# Patient Record
Sex: Female | Born: 1940
Health system: Southern US, Community
[De-identification: ages and names within clinical notes are randomized; demographics above are authoritative.]

## PROBLEM LIST (undated history)

## (undated) DIAGNOSIS — C4491 Basal cell carcinoma of skin, unspecified: Secondary | ICD-10-CM

## (undated) DIAGNOSIS — E785 Hyperlipidemia, unspecified: Secondary | ICD-10-CM

## (undated) DIAGNOSIS — I6529 Occlusion and stenosis of unspecified carotid artery: Secondary | ICD-10-CM

## (undated) DIAGNOSIS — K219 Gastro-esophageal reflux disease without esophagitis: Secondary | ICD-10-CM

## (undated) DIAGNOSIS — N183 Chronic kidney disease, stage 3 unspecified: Secondary | ICD-10-CM

## (undated) DIAGNOSIS — K760 Fatty (change of) liver, not elsewhere classified: Secondary | ICD-10-CM

## (undated) DIAGNOSIS — R002 Palpitations: Secondary | ICD-10-CM

## (undated) DIAGNOSIS — K1120 Sialoadenitis, unspecified: Secondary | ICD-10-CM

## (undated) DIAGNOSIS — K589 Irritable bowel syndrome without diarrhea: Secondary | ICD-10-CM

## (undated) DIAGNOSIS — I10 Essential (primary) hypertension: Secondary | ICD-10-CM

## (undated) DIAGNOSIS — G4700 Insomnia, unspecified: Secondary | ICD-10-CM

## (undated) DIAGNOSIS — R51 Headache: Secondary | ICD-10-CM

## (undated) DIAGNOSIS — K635 Polyp of colon: Secondary | ICD-10-CM

## (undated) DIAGNOSIS — M1990 Unspecified osteoarthritis, unspecified site: Secondary | ICD-10-CM

## (undated) DIAGNOSIS — M5136 Other intervertebral disc degeneration, lumbar region: Secondary | ICD-10-CM

## (undated) DIAGNOSIS — R519 Headache, unspecified: Secondary | ICD-10-CM

## (undated) DIAGNOSIS — Z85820 Personal history of malignant melanoma of skin: Secondary | ICD-10-CM

## (undated) DIAGNOSIS — M8580 Other specified disorders of bone density and structure, unspecified site: Secondary | ICD-10-CM

## (undated) DIAGNOSIS — M154 Erosive (osteo)arthritis: Secondary | ICD-10-CM

## (undated) HISTORY — DX: Insomnia, unspecified: G47.00

## (undated) HISTORY — DX: Occlusion and stenosis of unspecified carotid artery: I65.29

## (undated) HISTORY — DX: Erosive (osteo)arthritis: M15.4

## (undated) HISTORY — DX: Fatty (change of) liver, not elsewhere classified: K76.0

## (undated) HISTORY — DX: Polyp of colon: K63.5

## (undated) HISTORY — DX: Irritable bowel syndrome, unspecified: K58.9

## (undated) HISTORY — DX: Hyperlipidemia, unspecified: E78.5

## (undated) HISTORY — PX: RADIAL KERATOTOMY: SHX217

## (undated) HISTORY — DX: Basal cell carcinoma of skin, unspecified: C44.91

## (undated) HISTORY — PX: TONSILLECTOMY: SUR1361

## (undated) HISTORY — DX: Headache, unspecified: R51.9

## (undated) HISTORY — DX: Gastro-esophageal reflux disease without esophagitis: K21.9

## (undated) HISTORY — DX: Essential (primary) hypertension: I10

## (undated) HISTORY — DX: Sialoadenitis, unspecified: K11.20

## (undated) HISTORY — PX: BLADDER SUSPENSION: SHX72

## (undated) HISTORY — PX: VAGINAL HYSTERECTOMY: SUR661

## (undated) HISTORY — DX: Other intervertebral disc degeneration, lumbar region: M51.36

## (undated) HISTORY — DX: Other specified disorders of bone density and structure, unspecified site: M85.80

## (undated) HISTORY — DX: Headache: R51

## (undated) HISTORY — DX: Personal history of malignant melanoma of skin: Z85.820

## (undated) HISTORY — DX: Unspecified osteoarthritis, unspecified site: M19.90

---

## 1993-04-26 HISTORY — PX: BACK SURGERY: SHX140

## 1998-04-26 HISTORY — PX: CARPAL TUNNEL RELEASE: SHX101

## 1998-07-07 ENCOUNTER — Encounter: Payer: Self-pay | Admitting: Obstetrics and Gynecology

## 1998-07-10 ENCOUNTER — Inpatient Hospital Stay (HOSPITAL_COMMUNITY): Admission: RE | Admit: 1998-07-10 | Discharge: 1998-07-11 | Payer: Self-pay | Admitting: Obstetrics and Gynecology

## 1998-08-27 ENCOUNTER — Encounter: Payer: Self-pay | Admitting: Obstetrics and Gynecology

## 1998-08-27 ENCOUNTER — Ambulatory Visit (HOSPITAL_COMMUNITY): Admission: RE | Admit: 1998-08-27 | Discharge: 1998-08-27 | Payer: Self-pay | Admitting: Obstetrics and Gynecology

## 2000-06-29 ENCOUNTER — Encounter: Payer: Self-pay | Admitting: Internal Medicine

## 2000-06-29 ENCOUNTER — Ambulatory Visit (HOSPITAL_COMMUNITY): Admission: RE | Admit: 2000-06-29 | Discharge: 2000-06-29 | Payer: Self-pay | Admitting: Internal Medicine

## 2000-10-18 ENCOUNTER — Ambulatory Visit (HOSPITAL_BASED_OUTPATIENT_CLINIC_OR_DEPARTMENT_OTHER): Admission: RE | Admit: 2000-10-18 | Discharge: 2000-10-18 | Payer: Self-pay | Admitting: Orthopedic Surgery

## 2000-11-15 ENCOUNTER — Ambulatory Visit (HOSPITAL_BASED_OUTPATIENT_CLINIC_OR_DEPARTMENT_OTHER): Admission: RE | Admit: 2000-11-15 | Discharge: 2000-11-15 | Payer: Self-pay | Admitting: Orthopedic Surgery

## 2001-03-24 ENCOUNTER — Encounter: Admission: RE | Admit: 2001-03-24 | Discharge: 2001-03-24 | Payer: Self-pay | Admitting: Family Medicine

## 2001-03-24 ENCOUNTER — Encounter: Payer: Self-pay | Admitting: Family Medicine

## 2001-04-26 DIAGNOSIS — Z85820 Personal history of malignant melanoma of skin: Secondary | ICD-10-CM

## 2001-04-26 HISTORY — DX: Personal history of malignant melanoma of skin: Z85.820

## 2003-08-16 ENCOUNTER — Encounter: Admission: RE | Admit: 2003-08-16 | Discharge: 2003-08-16 | Payer: Self-pay | Admitting: Neurosurgery

## 2003-09-11 ENCOUNTER — Encounter: Admission: RE | Admit: 2003-09-11 | Discharge: 2003-09-11 | Payer: Self-pay | Admitting: Neurosurgery

## 2003-09-25 ENCOUNTER — Encounter: Admission: RE | Admit: 2003-09-25 | Discharge: 2003-09-25 | Payer: Self-pay | Admitting: Neurosurgery

## 2005-04-26 HISTORY — PX: BILATERAL OOPHORECTOMY: SHX1221

## 2005-09-14 ENCOUNTER — Ambulatory Visit: Admission: RE | Admit: 2005-09-14 | Discharge: 2005-09-14 | Payer: Self-pay | Admitting: Gynecology

## 2005-09-21 ENCOUNTER — Inpatient Hospital Stay (HOSPITAL_COMMUNITY): Admission: RE | Admit: 2005-09-21 | Discharge: 2005-09-23 | Payer: Self-pay | Admitting: Gynecology

## 2005-09-21 ENCOUNTER — Encounter (INDEPENDENT_AMBULATORY_CARE_PROVIDER_SITE_OTHER): Payer: Self-pay | Admitting: *Deleted

## 2006-11-11 ENCOUNTER — Encounter: Admission: RE | Admit: 2006-11-11 | Discharge: 2006-11-11 | Payer: Self-pay | Admitting: Family Medicine

## 2007-11-23 ENCOUNTER — Encounter: Admission: RE | Admit: 2007-11-23 | Discharge: 2007-11-23 | Payer: Self-pay | Admitting: Family Medicine

## 2008-12-12 ENCOUNTER — Encounter: Admission: RE | Admit: 2008-12-12 | Discharge: 2008-12-12 | Payer: Self-pay | Admitting: Family Medicine

## 2009-04-26 DIAGNOSIS — M8580 Other specified disorders of bone density and structure, unspecified site: Secondary | ICD-10-CM

## 2009-04-26 HISTORY — PX: OTHER SURGICAL HISTORY: SHX169

## 2009-04-26 HISTORY — DX: Other specified disorders of bone density and structure, unspecified site: M85.80

## 2009-04-26 LAB — HM DEXA SCAN

## 2009-06-05 ENCOUNTER — Encounter: Admission: RE | Admit: 2009-06-05 | Discharge: 2009-06-05 | Payer: Self-pay | Admitting: Family Medicine

## 2009-12-12 ENCOUNTER — Encounter: Admission: RE | Admit: 2009-12-12 | Discharge: 2009-12-12 | Payer: Self-pay | Admitting: Family Medicine

## 2010-02-05 ENCOUNTER — Ambulatory Visit: Payer: Self-pay | Admitting: Family Medicine

## 2010-04-26 DIAGNOSIS — K760 Fatty (change of) liver, not elsewhere classified: Secondary | ICD-10-CM

## 2010-04-26 HISTORY — DX: Fatty (change of) liver, not elsewhere classified: K76.0

## 2010-05-26 NOTE — Assessment & Plan Note (Signed)
Summary: FLU SHOT/JBB  Nurse Visit   Immunizations Administered:  Influenza Vaccine:    Vaccine Type: FLULAVAL    Site: left deltoid    Mfr: GlaxoSmithKline    Dose: 0.5 ml    Route: IM    Given by: Levonne Spiller EMT-P    Exp. Date: 09/24/2010    Lot #: [id number]    VIS given: 11/18/09 version given February 05, 2010.   Immunizations Administered:  Influenza Vaccine:    Vaccine Type: FLULAVAL    Site: left deltoid    Mfr: GlaxoSmithKline    Dose: 0.5 ml    Route: IM    Given by: Levonne Spiller EMT-P    Exp. Date: 09/24/2010    Lot #: [id number]    VIS given: 11/18/09 version given February 05, 2010.  Flu Vaccine Consent Questions:    Do you have a history of severe allergic reactions to this vaccine? no    Any prior history of allergic reactions to egg and/or gelatin? no    Do you have a sensitivity to the preservative Thimersol? no    Do you have a past history of Guillan-Barre Syndrome? no    Do you currently have an acute febrile illness? no    Have you ever had a severe reaction to latex? no    Vaccine information given and explained to patient? yes    Are you currently pregnant? no

## 2010-06-25 HISTORY — PX: OTHER SURGICAL HISTORY: SHX169

## 2010-09-11 NOTE — Discharge Summary (Signed)
NAMECORABELLE, SPACKMAN                 ACCOUNT NO.:  [id number]   MEDICAL RECORD NO.:  [id number]          PATIENT TYPE:  INP   LOCATION:  1615                         FACILITY:  [facility]   PHYSICIAN:  Zenaida Niece, M.D.DATE OF BIRTH:  06-11-1940   DATE OF ADMISSION:  09/21/2005  DATE OF DISCHARGE:  09/23/2005                                 DISCHARGE SUMMARY   ADMISSION DIAGNOSIS:  Ovarian cysts.   DISCHARGE DIAGNOSIS:  Bilateral ovarian serous cystadenofibromas.   PROCEDURE:  On Sep 21, 2005 she had an exploratory laparotomy with bilateral  salpingo-oophorectomy.   HISTORY AND PHYSICAL:  Please see chart for full History and Physical.  Briefly this is a 70 year old white female who has been followed with serial  ultrasounds and CA-125s for ovarian cysts.  Her most recent ultrasound in  May reveals significant increase in complexity and size of bilateral ovarian  cysts.  Her CA-125 is stable.   PAST HISTORY:  Significant for one vaginal delivery.   PAST SURGICAL HISTORY:  Vaginal hysterectomy with anterior repair followed  by another anterior repair, perineorrhaphy, and pubovaginal sling.   PHYSICAL EXAMINATION:  ABDOMEN:  Benign without palpable masses.  PELVIC:  No significant masses.   HOSPITAL COURSE:  The patient was admitted on the day of surgery and taken  to the operating room.  She had exploratory laparotomy with bilateral  salpingo-oophorectomy performed by myself and Dr. Kyla Balzarine.  Frozen section  revealed ovarian fibroadenomas of both ovaries which were enlarged and  cystic.  Postoperatively, she had no significant complications.  Preoperative hemoglobin 12.6, postoperative 10.5.  On the morning of May 31,  which was postoperative day #2 she had some difficulty voiding.  She was  straight cathed x1 but was able to void on her own after that.  She was felt  to be stable enough for discharge home after that.   DISCHARGE INSTRUCTIONS:  Diet is a regular diet.  Activity  is no strenuous  activity.  Follow up is in 4-5 days for staple removal.   DISCHARGE MEDICATIONS:  Percocet #30 p.r.n. pain.   FINAL PATHOLOGY:  Bilateral serous cystadenofibromas of both ovaries and  benign fallopian tubes with paratubal cyst.      Zenaida Niece, M.D.  Electronically Signed     TDM/MEDQ  D:  10/11/2005  T:  10/12/2005  Job:  [id number]   cc:   Jonny Ruiz T. Kyla Balzarine, M.D.  [address]  [place]  [place] [address]   Telford Nab, R.N.  [address]  [place], [place] [address]

## 2010-09-11 NOTE — Op Note (Signed)
Carrie Lucas, Carrie Lucas                 ACCOUNT NO.:  [id number]   MEDICAL RECORD NO.:  [id number]          PATIENT TYPE:  INP   LOCATION:  X003                         FACILITY:  [facility]   PHYSICIAN:  John T. Kyla Balzarine, M.D.    DATE OF BIRTH:  [birth year]   DATE OF PROCEDURE:  DATE OF DISCHARGE:                                 OPERATIVE REPORT   PREOPERATIVE DIAGNOSIS:  Complex adnexal masses.   POSTOPERATIVE DIAGNOSES:  1.  Benign fibroadenomas of the ovary.  2.  Pelvic adhesions.   PROCEDURE:  Bilateral salpingo-oophorectomy.   ANESTHESIA:  General endotracheal.   ESTIMATED BLOOD LOSS:  100 mL.   SURGEON:  Lavina Hamman, MD   ASSISTANT:  Ronita Hipps, MD   OPERATIVE FINDINGS:  The patient had bilateral fibroadenomas, benign on  frozen section.  There were adhesions from omentum to the vaginal cuff and  adnexa; prior hysterectomy.   DESCRIPTION OF PROCEDURE:  Please refer to Dr. Eligha Bridegroom formal operative  dictated note.  Throughout surgery I served as an Geophysicist/field seismologist.  The patient  was explored through a midline incision with the findings described above.  Adhesions between omentum and adnexa were freed, bowel packed out of the  pelvis and bilateral salpingo-oophorectomy performed.  Bilaterally, the  round ligaments were divided with electrocautery, peritoneum of the broad  ligament opened lateral to the infundibulopelvic ligaments and ureters  identified.  The infundibulopelvic ligaments were skeletonized,  crossclamped, divided and doubly ligated with 2-0 Vicryl.  Using sharp and  blunt dissection with electrocautery for hemostasis, the cystic ovaries were  dissected off of the sidewall and the cuff.  Additional hemostasis was  required with interrupted figure-of-eight sutures of 2-0 Vicryl in the left  paravesical region.  Hemostasis was excellent, sponges and retractors were  removed, abdomen irrigated and closed in layers  with running PDS for fascia and clips for the skin.   Frozen section returned  benign fibroadenomas.  Sponge and instrument counts were correct.  Pathology  specimens, bilateral tubes and ovaries.  The patient returned to the  recovery room in stable condition.      John T. Kyla Balzarine, M.D.  Electronically Signed     JTS/MEDQ  D:  09/21/2005  T:  09/21/2005  Job:  [id number]   cc:   Zenaida Niece, M.D.  Fax: [phone]   Telford Nab, R.N.  [address]  [place], [place] [address]

## 2010-09-11 NOTE — Consult Note (Signed)
Carrie Lucas, Carrie Lucas NO.:  [id number]   MEDICAL RECORD NO.:  [id number]          PATIENT TYPE:  OUT   LOCATION:  GYN                          FACILITY:  [facility]   PHYSICIAN:  De Blanch, M.D.DATE OF BIRTH:  Dec 12, 1940   DATE OF CONSULTATION:  09/14/2005  DATE OF DISCHARGE:                                   CONSULTATION   CHIEF COMPLAINT:  Complex adnexal mass.   HISTORY OF PRESENT ILLNESS:  A 70 year old white female seen in consultation  at the request of Dr. Tawanna Cooler Meisinger regarding the evolution of ovarian  cysts which now contain multiple internal solid irregular excrescences. It  is interesting to note that the patient has been followed with serial  ultrasounds since 2002 with ovarian cysts which were simple and with smooth  wall, no excrescences, no nodularities or no solid areas. The most recent  ultrasound obtained prior to this first abnormal one on May 11 was on July 20, 2004. The patient has also had normal CA 125 periodically, the most  recent being 6.6 units/mL on Sep 09, 2005.   The patient does have abdominal bloating and some abdominal pressure which  she attributes to irritable bowel syndrome.   PAST MEDICAL HISTORY:  Medical illnesses:  Irritable bowel syndrome.   PAST SURGICAL HISTORY:  1.  Back surgery in 1995.  2.  Vaginal hysterectomy.  3.  Carpal tunnel release.  4.  Suburethral sling.   CURRENT MEDICATIONS:  Tamoxifen (as prophylaxis for breast cancer) and  Premarin 0.325 mg. The patient stopped both of these medications  approximately 2 weeks ago.   OBSTETRICAL HISTORY:  Gravida 1.   ALLERGIES:  None.   FAMILY HISTORY:  The patient has a sister with breast cancer and a paternal  aunt with ovarian cancer.   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   REVIEW OF SYSTEMS:  A 10-point comprehensive review of  systems is performed  and is negative except for symptoms noted above.   PHYSICAL EXAMINATION:  VITAL SIGNS:  Weight 148 pounds, height 5 foot 2,  blood pressure 164/84, pulse 96.  GENERAL:  The patient is a healthy white female in no acute distress.  HEENT:  Negative.  NECK:  Supple without thyromegaly. There was no supraclavicular or inguinal  adenopathy.  ABDOMEN:  Soft and nontender, no masses, organomegaly, ascites or hernias  are noted.  PELVIC:  EGBUS, vagina, bladder, urethra are normal. Cervix and uterus are  surgically absent. On bimanual examination, there is fullness throughout the  pelvis without a discreet mass. I do not feel any nodularity and there is no  pain. Rectovaginal exam confirms.   IMPRESSION:  Complex pelvic masses which now have internal excrescences  concerning for an ovarian neoplasm.   I had a lengthy discussion with the patient and her husband. I recommend  that she undergo exploratory laparotomy, bilateral salpingo-oophorectomy and  intraoperative frozen section. We outlined the course of surgery should this  be an ovarian cancer including multiple peritoneal biopsies, washings,  omentectomy and pelvic and periaortic lymphadenopathy. The risks of surgery  including hemorrhage, infection, injury to adjacent viscera, thromboembolic  complications, and anesthetic risks were outlined. All other questions are  answered and we will proceed with surgery on June 5 with Dr. Kyla Balzarine as the  primary gynecologic/oncology surgeon. All other questions are answered.      De Blanch, M.D.  Electronically Signed     DC/MEDQ  D:  09/14/2005  T:  09/15/2005  Job:  [id number]   cc:   Zenaida Niece, M.D.  Fax: [phone]   Telford Nab, R.N.  [address]  [place], [place] [address]

## 2010-09-11 NOTE — H&P (Signed)
NAMEVELENCIA, Carrie Lucas                 ACCOUNT NO.:  [id number]   MEDICAL RECORD NO.:  [id number]          PATIENT TYPE:  INP   LOCATION:  NA                           FACILITY:  [facility]   PHYSICIAN:  Zenaida Niece, M.D.DATE OF BIRTH:  07-16-1940   DATE OF ADMISSION:  09/21/2005  DATE OF DISCHARGE:                                HISTORY & PHYSICAL   CHIEF COMPLAINT:  Ovarian masses.   HISTORY OF PRESENT ILLNESS:  This is a 70 year old white female, para 1-0-0-  1, who was being followed for ovarian cysts.  This problem started in  January 2002 when the patient was seen by her primary care physician for  pelvic pressure.  Ultrasound at that point done at [facility]  revealed a prominent right ovary with at least three cysts, the largest of  which measured approximately 2.5 cm.  Exam by myself in February of 2002 did  reveal a palpable right ovary.  A CA125 was checked and this was normal at  13.6.  Repeat ultrasound was performed in April of 2002 and this revealed  stable simple cysts of the right ovary.  Repeat ultrasound again done in  October 2002 at [facility] again revealed stable simple cysts.  The next ultrasound was performed in March of 2003 which revealed stable  simple cysts again in the right ovary.  CA125 drawn in March of 2003 was  decreased to 8.2.  Physical exam at that point revealed no masses.  At that  point, we elected to proceed with yearly ultrasound as we had documented  stability by serial ultrasound and normal CA125's.  Next ultrasound was done  in March of 2004, again at [facility].  This revealed again  stable simple cysts of the right ovary as well as a small simple cyst of the  left ovary.  Exam at that point still revealed no significant mass.  Next  ultrasound was performed in the office in March of 2005 and this revealed  stable cysts without change and this was consistent with her exam.  Repeat  ultrasound in March of  2006 revealed stable cysts and nothing new on exam.  She then had her annual exam this May which again revealed no significant  mass.  However, ultrasound performed in our office also in May of this year  revealed increasing size of the cysts as well as possible excrescences which  is a definite change from her prior ultrasound.  A CA125, however, is stable  at 6.6.  She has consulted with Dr. De Blanch and due to the  changing appearance of the cysts since last year we have elected to proceed  with laparotomy, removal of these cysts and possible staging.  She, at this  point, has no significant symptoms.   PAST OB HISTORY:  Significant for one vaginal delivery without  complications.   PAST MEDICAL HISTORY:  Essentially negative.   PAST SURGICAL HISTORY:  She has had a tonsillectomy and a previous vaginal  hysterectomy with anterior repair in 1972.  In 2000, I did an anterior  colporrhaphy and  perineorrhaphy and Dr. Derrell Lolling. Sural did a pubovaginal  sling.   ALLERGIES:  None known.   CURRENT MEDICATIONS:  Tamoxifen for breast cancer prevention.  Premarin 0.3  mg p.o. every two to three days and a new prescription for irritable bowel  syndrome.   FAMILY HISTORY:  She has a sister with breast and uterine cancer.   REVIEW OF SYSTEMS:  She does have chronic irritable bowel syndrome, normal  bladder function.   SOCIAL HISTORY:  She is married and denies alcohol, tobacco or drug use.   PHYSICAL EXAMINATION:  VITAL SIGNS:  Weight is approximately 146 pounds.  Blood pressure is 120/72.  GENERAL:  This is a well-developed, white female in no acute distress.  NECK:  Supple without lymphadenopathy or thyromegaly.  LUNGS:  Clear to auscultation.  HEART:  Regular rate and rhythm with a 2/6 systolic ejection murmur.  ABDOMEN:  Soft, nontender, and nondistended without palpable masses.  EXTREMITIES:  No edema and are nontender.  PELVIC:  External genitalia has no lesions.  On  speculum exam, the vagina is  normal without significant lesions.  On bimanual exam, there are no masses  and she is nontender.  On rectovaginal exam, there are no masses and she is  nontender.   ASSESSMENT:  Ovarian cysts which have changed in appearance since last year.  She does have a stable CA125.  However, due to the changing appearance of  the cyst, we elected to proceed with surgical removal to rule out ovarian  cancer.  Risks of surgery have been discussed and she has seen Dr. De Blanch preoperatively.  The plan is to admit the patient for  exploratory laparotomy with bilateral salpingo-oophorectomy and possible  staging procedure if frozen section reveals malignancy.      Zenaida Niece, M.D.  Electronically Signed     TDM/MEDQ  D:  09/20/2005  T:  09/20/2005  Job:  [id number]

## 2010-09-11 NOTE — Op Note (Signed)
Carrie Lucas, Carrie Lucas                 ACCOUNT NO.:  [id number]   MEDICAL RECORD NO.:  [id number]          PATIENT TYPE:  INP   LOCATION:  X003                         FACILITY:  [facility]   PHYSICIAN:  Zenaida Niece, M.D.DATE OF BIRTH:  18-Feb-1941   DATE OF PROCEDURE:  09/21/2005  DATE OF DISCHARGE:                                 OPERATIVE REPORT   PREOPERATIVE DIAGNOSIS:  Bilateral ovarian cysts.   POSTOPERATIVE DIAGNOSIS:  Bilateral ovarian cysts.   PROCEDURE:  Exploratory laparotomy with bilateral salpingo-oophorectomy.   SURGEON:  Zenaida Niece, M.D.   ASSISTANT:  Ronita Hipps, M.D.   ANESTHESIA:  General endotracheal tube.   SPECIMENS:  Bilateral tubes and ovaries that were sent for frozen section  and revealed benign ovarian fibroadenomas.   ESTIMATED BLOOD LOSS:  100 mL.   COMPLICATIONS:  None.   FINDINGS:  She had a few omental adhesions to the left ovary, and the right  ovary was enlarged and cystic.   PROCEDURE IN DETAIL:  The patient was taken to the operating room and placed  in the dorsal supine position.  General anesthesia was induced and she was  placed in mobile stirrups.  The abdomen and perineum were then prepped and  draped in the usual sterile fashion.  Abdomen was then entered via a  vertical incision with layers through skin, subcutaneous tissue, fascia,  muscle divided with electrocautery and peritoneum entered sharply.  Exploratory laparotomy then revealed no nodularity in the upper or lower  abdomen.  The omentum appeared normal.  A few adhesions of the omentum to  the left ovary were taken down with electrocautery.  The bowels were then  packed out of the abdomen and the Bookwalter retractor was placed for  excellent visualization.  We first started on the right side.  The right  round ligament was divided with electrocautery and this incision was then  taken laterally to expose the infundibulopelvic ligament and the ureter.  A  window was made  in an avascular portion of the broad ligament beneath the  infundibulopelvic ligament, and this ligament was then doubly clamped.  It  was then cut and doubly ligated with 0 Vicryl.  The remainder of the ovary  was then removed from the pelvic sidewall with electrocautery, being careful  to avoid the ureter.  This was then sent as a frozen section, which again  did return a benign fibroadenoma.  Similar to the right side, on the left  side the round ligament was grasped and elevated and divided with  electrocautery.  The infundibulopelvic ligament was isolated once the ureter  had been pushed out of the way.  The infundibulopelvic ligament was then  divided and doubly ligated with 0 Vicryl.  The remainder of the ovary was  then removed from the pelvic sidewall with electrocautery.  Bleeding from  what was felt to be the left uterine artery was controlled with one suture  of 0 Vicryl.  The remainder of the pelvis had small bleeders controlled with  electrocautery.  Once this ovary was removed, it was also sent as frozen  section.  The pelvis was irrigated and found to be hemostatic.  All packs  were removed from the abdomen and the retractor was removed.  The subfascial  space was inspected and found to be hemostatic.  The abdominal incision was  closed with a bulk closure of 0 PDS starting at both ends and meeting in the  middle with both sutures being tied in the middle.  Subcutaneous tissue was  then irrigated and made hemostatic with electrocautery.  Skin was then  closed with staples, followed by a sterile dressing.  The patient tolerated  the procedure well.  She was extubated in the operating room and taken to  the recovery room in stable condition.  Counts were correct x2 and she  received Mefoxin 1 g prior the procedure and had PAS hose on throughout the  procedure.      Zenaida Niece, M.D.  Electronically Signed     TDM/MEDQ  D:  09/21/2005  T:  09/21/2005  Job:   [id number]

## 2010-09-11 NOTE — Op Note (Signed)
[facility]  Patient:    Carrie Lucas, Carrie Lucas                   MRN: [id number] Proc. Date: 10/18/00 Adm. Date:  [id number] Attending:  Ronne Binning CC:         Nicki Reaper, M.D. (2)   Operative Report  PREOPERATIVE DIAGNOSIS:  Carpal tunnel syndrome, right hand.  POSTOPERATIVE DIAGNOSIS:  Carpal tunnel syndrome, right hand.  OPERATION:  Decompression of right median nerve.  SURGEON:  Nicki Reaper, M.D.  ASSISTANT:  Joaquin Courts, R.N.  ANESTHESIA:  Forearm-based IV regional.  ANESTHESIOLOGIST:  Burna Forts, M.D.  INDICATIONS:  The patient is a 70 year old female with a history of carpal tunnel syndrome, with EMG and nerve conductions positive, which has not responded to conservative treatment.  DESCRIPTION OF PROCEDURE:  The patient was brought to the operating room where a forearm-based IV regional anesthetic was carried out without difficulty. She was prepped and draped using Betadine scrub and solution, with the right arm free.  A longitudinal incision was made in the palm and carried down through the subcutaneous tissue.  Bleeders were electrocauterized.  The palmar fascia was split.  The superficial palmar arch identified.  The flexor tendon to the ring and little finger identified to the ulnar side of the median nerve.  The carpal retinaculum was incised with sharp dissection.  The right angle and Sewall retractor were placed between the skin and forearm fascia. The fascia was released for approximately 3.0 cm proximal to the wrist crease under direct vision.  The hand was explored.  No further lesions were identified.  The tenosynovial tissue was moderately thickened.  The wound was irrigated.  The skin was closed with interrupted #5-0 nylon sutures.  A sterile compressive dressing and splint were applied.  The patient tolerated the procedure well and was taken to the recovery room for observation, in satisfactory  condition.  DISPOSITION:  She is discharged home, to return to the [facility] [place] in one week, on Vicodin and Keflex. DD:  10/18/00 TD:  10/18/00 Job: [id number]

## 2010-09-11 NOTE — Op Note (Signed)
[name]  Patient:    Carrie Lucas, Carrie Lucas                   MRN: [id number] Proc. Date: 11/15/00 Adm. Date:  [id number] Disc. Date: [id number] Attending:  Ronne Binning                           Operative Report  PREOPERATIVE DIAGNOSIS:  Carpal tunnel syndrome, left hand.  POSTOPERATIVE DIAGNOSIS:  Carpal tunnel syndrome, left hand.  OPERATION:  Decompression left median nerve.  SURGEON:  Nicki Reaper, M.D.  ASSISTANT:  Joaquin Courts, R.N.  ANESTHESIA:  Forearm-based IV regional.  DATE OF OPERATION:  November 15, 2000  ANESTHESIOLOGIST:  Halford Decamp, M.D.  HISTORY:  The patient is a 70 year old female with a history of carpal tunnel syndrome, EMG nerve conduction is positive, which has not responded to conservative treatment.  PROCEDURE:  The patient was brought to the operating room where a forearm-based IV regional anesthetic was carried out without difficulty.  She prepped and draped using Betadine scrub and solution with the left arm free. A longitudinal incision was made in the palm and carried down through the subcutaneous tissue.  Bleeders were electrocauterized.  Palmar fascia was split, the superficial palmar arch identified, the flexor tendon tendon to the ring and little finger identified to the ulnar side of the median nerve.  The carpal retinaculum was incised with sharp dissection.  A right angle and Sewall retractor were placed between skin and forearm fascia.  The fascia was released for approximately 3 cm proximal to the wrist crease under direct vision.  The canal was explored and no further lesion was identified.  The tenosynovial tissue was moderately thickened.  The wound was irrigated.  The skin was closed with interrupted 5-0 nylon sutures.  A sterile compressive dressing and splint were applied.  The patient tolerated the procedure well and was taken to the recovery room for observation in satisfactory  condition.  DISPOSITION:  She is discharged home to return to [facility] [place] in one week on Vicodin and Keflex. DD:  11/15/00 TD:  11/15/00 Job: [id number]

## 2010-11-19 ENCOUNTER — Other Ambulatory Visit: Payer: Self-pay | Admitting: Family Medicine

## 2010-11-19 DIAGNOSIS — Z1231 Encounter for screening mammogram for malignant neoplasm of breast: Secondary | ICD-10-CM

## 2010-12-14 ENCOUNTER — Ambulatory Visit
Admission: RE | Admit: 2010-12-14 | Discharge: 2010-12-14 | Disposition: A | Discharge status: Home / Self Care | Payer: Medicare Other | Source: Ambulatory Visit | Attending: Family Medicine | Admitting: Family Medicine

## 2010-12-14 DIAGNOSIS — Z1231 Encounter for screening mammogram for malignant neoplasm of breast: Secondary | ICD-10-CM

## 2010-12-24 ENCOUNTER — Other Ambulatory Visit: Payer: Self-pay | Admitting: Gastroenterology

## 2010-12-24 HISTORY — PX: COLONOSCOPY: SHX174

## 2010-12-24 HISTORY — PX: ESOPHAGOGASTRODUODENOSCOPY: SHX1529

## 2011-01-19 ENCOUNTER — Other Ambulatory Visit (HOSPITAL_COMMUNITY): Payer: Self-pay | Admitting: Gastroenterology

## 2011-01-19 DIAGNOSIS — R112 Nausea with vomiting, unspecified: Secondary | ICD-10-CM

## 2011-02-02 ENCOUNTER — Encounter (HOSPITAL_COMMUNITY)
Admission: RE | Admit: 2011-02-02 | Discharge: 2011-02-02 | Disposition: A | Discharge status: Home / Self Care | Payer: Medicare Other | Source: Ambulatory Visit | Attending: Gastroenterology | Admitting: Gastroenterology

## 2011-02-02 DIAGNOSIS — R112 Nausea with vomiting, unspecified: Secondary | ICD-10-CM | POA: Insufficient documentation

## 2011-02-02 MED ORDER — TECHNETIUM TC 99M SULFUR COLLOID
2.0000 | Freq: Once | INTRAVENOUS | Status: AC | PRN
  Administered 2011-02-02: 2 via ORAL

## 2011-02-08 ENCOUNTER — Other Ambulatory Visit: Payer: Self-pay | Admitting: Gastroenterology

## 2011-02-11 ENCOUNTER — Ambulatory Visit
Admission: RE | Admit: 2011-02-11 | Discharge: 2011-02-11 | Disposition: A | Discharge status: Home / Self Care | Payer: Medicare Other | Source: Ambulatory Visit | Attending: Gastroenterology | Admitting: Gastroenterology

## 2011-03-11 ENCOUNTER — Other Ambulatory Visit: Payer: Self-pay | Admitting: Gastroenterology

## 2011-03-11 DIAGNOSIS — R140 Abdominal distension (gaseous): Secondary | ICD-10-CM

## 2011-03-11 DIAGNOSIS — R109 Unspecified abdominal pain: Secondary | ICD-10-CM

## 2011-03-15 ENCOUNTER — Ambulatory Visit
Admission: RE | Admit: 2011-03-15 | Discharge: 2011-03-15 | Disposition: A | Discharge status: Home / Self Care | Payer: Medicare Other | Source: Ambulatory Visit | Attending: Gastroenterology | Admitting: Gastroenterology

## 2011-03-15 DIAGNOSIS — R140 Abdominal distension (gaseous): Secondary | ICD-10-CM

## 2011-03-15 DIAGNOSIS — R109 Unspecified abdominal pain: Secondary | ICD-10-CM

## 2011-03-15 MED ORDER — IOHEXOL 300 MG/ML  SOLN
100.0000 mL | Freq: Once | INTRAMUSCULAR | Status: AC | PRN
  Administered 2011-03-15: 100 mL via INTRAVENOUS

## 2011-05-04 DIAGNOSIS — H52209 Unspecified astigmatism, unspecified eye: Secondary | ICD-10-CM | POA: Diagnosis not present

## 2011-05-04 DIAGNOSIS — H16409 Unspecified corneal neovascularization, unspecified eye: Secondary | ICD-10-CM | POA: Diagnosis not present

## 2011-05-07 DIAGNOSIS — R109 Unspecified abdominal pain: Secondary | ICD-10-CM | POA: Diagnosis not present

## 2011-05-07 DIAGNOSIS — R141 Gas pain: Secondary | ICD-10-CM | POA: Diagnosis not present

## 2011-05-07 DIAGNOSIS — R143 Flatulence: Secondary | ICD-10-CM | POA: Diagnosis not present

## 2011-06-09 DIAGNOSIS — S99929A Unspecified injury of unspecified foot, initial encounter: Secondary | ICD-10-CM | POA: Diagnosis not present

## 2011-06-09 DIAGNOSIS — S99919A Unspecified injury of unspecified ankle, initial encounter: Secondary | ICD-10-CM | POA: Diagnosis not present

## 2011-06-09 DIAGNOSIS — S92919A Unspecified fracture of unspecified toe(s), initial encounter for closed fracture: Secondary | ICD-10-CM | POA: Diagnosis not present

## 2011-06-15 DIAGNOSIS — R143 Flatulence: Secondary | ICD-10-CM | POA: Diagnosis not present

## 2011-06-15 DIAGNOSIS — R141 Gas pain: Secondary | ICD-10-CM | POA: Diagnosis not present

## 2011-07-14 DIAGNOSIS — T148XXA Other injury of unspecified body region, initial encounter: Secondary | ICD-10-CM | POA: Diagnosis not present

## 2011-10-04 DIAGNOSIS — D1801 Hemangioma of skin and subcutaneous tissue: Secondary | ICD-10-CM | POA: Diagnosis not present

## 2011-10-04 DIAGNOSIS — L821 Other seborrheic keratosis: Secondary | ICD-10-CM | POA: Diagnosis not present

## 2011-10-04 DIAGNOSIS — L578 Other skin changes due to chronic exposure to nonionizing radiation: Secondary | ICD-10-CM | POA: Diagnosis not present

## 2011-10-08 DIAGNOSIS — Z124 Encounter for screening for malignant neoplasm of cervix: Secondary | ICD-10-CM | POA: Diagnosis not present

## 2011-10-08 DIAGNOSIS — Z0000 Encounter for general adult medical examination without abnormal findings: Secondary | ICD-10-CM | POA: Diagnosis not present

## 2011-10-08 DIAGNOSIS — Z01419 Encounter for gynecological examination (general) (routine) without abnormal findings: Secondary | ICD-10-CM | POA: Diagnosis not present

## 2011-10-14 DIAGNOSIS — R6889 Other general symptoms and signs: Secondary | ICD-10-CM | POA: Diagnosis not present

## 2011-11-12 ENCOUNTER — Other Ambulatory Visit: Payer: Self-pay | Admitting: Family Medicine

## 2011-11-12 DIAGNOSIS — Z1231 Encounter for screening mammogram for malignant neoplasm of breast: Secondary | ICD-10-CM

## 2011-11-18 DIAGNOSIS — E782 Mixed hyperlipidemia: Secondary | ICD-10-CM | POA: Diagnosis not present

## 2011-11-18 DIAGNOSIS — I10 Essential (primary) hypertension: Secondary | ICD-10-CM | POA: Diagnosis not present

## 2011-11-18 DIAGNOSIS — M79609 Pain in unspecified limb: Secondary | ICD-10-CM | POA: Diagnosis not present

## 2011-12-01 DIAGNOSIS — M19049 Primary osteoarthritis, unspecified hand: Secondary | ICD-10-CM | POA: Diagnosis not present

## 2011-12-21 ENCOUNTER — Ambulatory Visit
Admission: RE | Admit: 2011-12-21 | Discharge: 2011-12-21 | Disposition: A | Discharge status: Home / Self Care | Payer: Medicare Other | Source: Ambulatory Visit | Attending: Family Medicine | Admitting: Family Medicine

## 2011-12-21 DIAGNOSIS — Z1231 Encounter for screening mammogram for malignant neoplasm of breast: Secondary | ICD-10-CM | POA: Diagnosis not present

## 2012-04-17 DIAGNOSIS — R059 Cough, unspecified: Secondary | ICD-10-CM | POA: Diagnosis not present

## 2012-04-17 DIAGNOSIS — R05 Cough: Secondary | ICD-10-CM | POA: Diagnosis not present

## 2012-04-26 DIAGNOSIS — M51369 Other intervertebral disc degeneration, lumbar region without mention of lumbar back pain or lower extremity pain: Secondary | ICD-10-CM

## 2012-04-26 DIAGNOSIS — M5136 Other intervertebral disc degeneration, lumbar region: Secondary | ICD-10-CM

## 2012-04-26 HISTORY — DX: Other intervertebral disc degeneration, lumbar region: M51.36

## 2012-04-26 HISTORY — PX: OTHER SURGICAL HISTORY: SHX169

## 2012-04-26 HISTORY — DX: Other intervertebral disc degeneration, lumbar region without mention of lumbar back pain or lower extremity pain: M51.369

## 2012-05-10 DIAGNOSIS — R059 Cough, unspecified: Secondary | ICD-10-CM | POA: Diagnosis not present

## 2012-05-10 DIAGNOSIS — R05 Cough: Secondary | ICD-10-CM | POA: Diagnosis not present

## 2012-05-22 DIAGNOSIS — M76899 Other specified enthesopathies of unspecified lower limb, excluding foot: Secondary | ICD-10-CM | POA: Diagnosis not present

## 2012-05-22 DIAGNOSIS — IMO0002 Reserved for concepts with insufficient information to code with codable children: Secondary | ICD-10-CM | POA: Diagnosis not present

## 2012-06-28 DIAGNOSIS — H52209 Unspecified astigmatism, unspecified eye: Secondary | ICD-10-CM | POA: Diagnosis not present

## 2012-06-28 DIAGNOSIS — H16409 Unspecified corneal neovascularization, unspecified eye: Secondary | ICD-10-CM | POA: Diagnosis not present

## 2012-07-04 DIAGNOSIS — M719 Bursopathy, unspecified: Secondary | ICD-10-CM | POA: Diagnosis not present

## 2012-07-04 DIAGNOSIS — M67919 Unspecified disorder of synovium and tendon, unspecified shoulder: Secondary | ICD-10-CM | POA: Diagnosis not present

## 2012-07-13 DIAGNOSIS — H2640 Unspecified secondary cataract: Secondary | ICD-10-CM | POA: Diagnosis not present

## 2012-07-13 DIAGNOSIS — H26499 Other secondary cataract, unspecified eye: Secondary | ICD-10-CM | POA: Diagnosis not present

## 2012-08-01 DIAGNOSIS — I10 Essential (primary) hypertension: Secondary | ICD-10-CM | POA: Diagnosis not present

## 2012-08-01 DIAGNOSIS — M5430 Sciatica, unspecified side: Secondary | ICD-10-CM | POA: Diagnosis not present

## 2012-08-15 DIAGNOSIS — IMO0002 Reserved for concepts with insufficient information to code with codable children: Secondary | ICD-10-CM | POA: Diagnosis not present

## 2012-08-17 ENCOUNTER — Other Ambulatory Visit: Payer: Self-pay | Admitting: Family Medicine

## 2012-08-17 DIAGNOSIS — H2640 Unspecified secondary cataract: Secondary | ICD-10-CM | POA: Diagnosis not present

## 2012-08-17 DIAGNOSIS — H26499 Other secondary cataract, unspecified eye: Secondary | ICD-10-CM | POA: Diagnosis not present

## 2012-08-17 DIAGNOSIS — IMO0002 Reserved for concepts with insufficient information to code with codable children: Secondary | ICD-10-CM

## 2012-08-19 ENCOUNTER — Ambulatory Visit
Admission: RE | Admit: 2012-08-19 | Discharge: 2012-08-19 | Disposition: A | Discharge status: Home / Self Care | Payer: Medicare Other | Source: Ambulatory Visit | Attending: Family Medicine | Admitting: Family Medicine

## 2012-08-19 DIAGNOSIS — M47817 Spondylosis without myelopathy or radiculopathy, lumbosacral region: Secondary | ICD-10-CM | POA: Diagnosis not present

## 2012-08-19 DIAGNOSIS — IMO0002 Reserved for concepts with insufficient information to code with codable children: Secondary | ICD-10-CM

## 2012-08-19 DIAGNOSIS — M48061 Spinal stenosis, lumbar region without neurogenic claudication: Secondary | ICD-10-CM | POA: Diagnosis not present

## 2012-08-29 DIAGNOSIS — M48061 Spinal stenosis, lumbar region without neurogenic claudication: Secondary | ICD-10-CM | POA: Diagnosis not present

## 2012-08-29 DIAGNOSIS — M545 Low back pain: Secondary | ICD-10-CM | POA: Diagnosis not present

## 2012-08-29 DIAGNOSIS — M5137 Other intervertebral disc degeneration, lumbosacral region: Secondary | ICD-10-CM | POA: Diagnosis not present

## 2012-08-29 DIAGNOSIS — M4310 Spondylolisthesis, site unspecified: Secondary | ICD-10-CM | POA: Diagnosis not present

## 2012-08-31 DIAGNOSIS — Z5181 Encounter for therapeutic drug level monitoring: Secondary | ICD-10-CM | POA: Diagnosis not present

## 2012-08-31 DIAGNOSIS — M4310 Spondylolisthesis, site unspecified: Secondary | ICD-10-CM | POA: Diagnosis not present

## 2012-08-31 DIAGNOSIS — M545 Low back pain: Secondary | ICD-10-CM | POA: Diagnosis not present

## 2012-08-31 DIAGNOSIS — IMO0002 Reserved for concepts with insufficient information to code with codable children: Secondary | ICD-10-CM | POA: Diagnosis not present

## 2012-08-31 DIAGNOSIS — M48061 Spinal stenosis, lumbar region without neurogenic claudication: Secondary | ICD-10-CM | POA: Diagnosis not present

## 2012-09-04 DIAGNOSIS — M4716 Other spondylosis with myelopathy, lumbar region: Secondary | ICD-10-CM | POA: Diagnosis not present

## 2012-09-04 DIAGNOSIS — Q762 Congenital spondylolisthesis: Secondary | ICD-10-CM | POA: Diagnosis not present

## 2012-09-04 DIAGNOSIS — M48061 Spinal stenosis, lumbar region without neurogenic claudication: Secondary | ICD-10-CM | POA: Diagnosis not present

## 2012-09-05 DIAGNOSIS — M4310 Spondylolisthesis, site unspecified: Secondary | ICD-10-CM | POA: Diagnosis not present

## 2012-09-05 DIAGNOSIS — IMO0002 Reserved for concepts with insufficient information to code with codable children: Secondary | ICD-10-CM | POA: Diagnosis not present

## 2012-09-05 DIAGNOSIS — M48061 Spinal stenosis, lumbar region without neurogenic claudication: Secondary | ICD-10-CM | POA: Diagnosis not present

## 2012-09-06 DIAGNOSIS — M48061 Spinal stenosis, lumbar region without neurogenic claudication: Secondary | ICD-10-CM | POA: Diagnosis not present

## 2012-09-06 DIAGNOSIS — Q762 Congenital spondylolisthesis: Secondary | ICD-10-CM | POA: Diagnosis not present

## 2012-09-06 DIAGNOSIS — M4716 Other spondylosis with myelopathy, lumbar region: Secondary | ICD-10-CM | POA: Diagnosis not present

## 2012-09-08 DIAGNOSIS — M48061 Spinal stenosis, lumbar region without neurogenic claudication: Secondary | ICD-10-CM | POA: Diagnosis not present

## 2012-09-08 DIAGNOSIS — Q762 Congenital spondylolisthesis: Secondary | ICD-10-CM | POA: Diagnosis not present

## 2012-09-08 DIAGNOSIS — M4716 Other spondylosis with myelopathy, lumbar region: Secondary | ICD-10-CM | POA: Diagnosis not present

## 2012-09-12 DIAGNOSIS — Q762 Congenital spondylolisthesis: Secondary | ICD-10-CM | POA: Diagnosis not present

## 2012-09-12 DIAGNOSIS — M4716 Other spondylosis with myelopathy, lumbar region: Secondary | ICD-10-CM | POA: Diagnosis not present

## 2012-09-12 DIAGNOSIS — M48061 Spinal stenosis, lumbar region without neurogenic claudication: Secondary | ICD-10-CM | POA: Diagnosis not present

## 2012-09-14 DIAGNOSIS — M4716 Other spondylosis with myelopathy, lumbar region: Secondary | ICD-10-CM | POA: Diagnosis not present

## 2012-09-14 DIAGNOSIS — M48061 Spinal stenosis, lumbar region without neurogenic claudication: Secondary | ICD-10-CM | POA: Diagnosis not present

## 2012-09-14 DIAGNOSIS — Q762 Congenital spondylolisthesis: Secondary | ICD-10-CM | POA: Diagnosis not present

## 2012-09-15 DIAGNOSIS — Q762 Congenital spondylolisthesis: Secondary | ICD-10-CM | POA: Diagnosis not present

## 2012-09-15 DIAGNOSIS — M4716 Other spondylosis with myelopathy, lumbar region: Secondary | ICD-10-CM | POA: Diagnosis not present

## 2012-09-15 DIAGNOSIS — M48061 Spinal stenosis, lumbar region without neurogenic claudication: Secondary | ICD-10-CM | POA: Diagnosis not present

## 2012-09-19 DIAGNOSIS — Q762 Congenital spondylolisthesis: Secondary | ICD-10-CM | POA: Diagnosis not present

## 2012-09-19 DIAGNOSIS — M48061 Spinal stenosis, lumbar region without neurogenic claudication: Secondary | ICD-10-CM | POA: Diagnosis not present

## 2012-09-19 DIAGNOSIS — M4716 Other spondylosis with myelopathy, lumbar region: Secondary | ICD-10-CM | POA: Diagnosis not present

## 2012-09-20 DIAGNOSIS — M48061 Spinal stenosis, lumbar region without neurogenic claudication: Secondary | ICD-10-CM | POA: Diagnosis not present

## 2012-09-20 DIAGNOSIS — Q762 Congenital spondylolisthesis: Secondary | ICD-10-CM | POA: Diagnosis not present

## 2012-09-20 DIAGNOSIS — M4716 Other spondylosis with myelopathy, lumbar region: Secondary | ICD-10-CM | POA: Diagnosis not present

## 2012-09-22 DIAGNOSIS — M48061 Spinal stenosis, lumbar region without neurogenic claudication: Secondary | ICD-10-CM | POA: Diagnosis not present

## 2012-09-22 DIAGNOSIS — Q762 Congenital spondylolisthesis: Secondary | ICD-10-CM | POA: Diagnosis not present

## 2012-09-22 DIAGNOSIS — M4716 Other spondylosis with myelopathy, lumbar region: Secondary | ICD-10-CM | POA: Diagnosis not present

## 2012-09-25 DIAGNOSIS — IMO0002 Reserved for concepts with insufficient information to code with codable children: Secondary | ICD-10-CM | POA: Diagnosis not present

## 2012-09-25 DIAGNOSIS — M4310 Spondylolisthesis, site unspecified: Secondary | ICD-10-CM | POA: Diagnosis not present

## 2012-09-25 DIAGNOSIS — M25559 Pain in unspecified hip: Secondary | ICD-10-CM | POA: Diagnosis not present

## 2012-10-03 DIAGNOSIS — L819 Disorder of pigmentation, unspecified: Secondary | ICD-10-CM | POA: Diagnosis not present

## 2012-10-03 DIAGNOSIS — D1801 Hemangioma of skin and subcutaneous tissue: Secondary | ICD-10-CM | POA: Diagnosis not present

## 2012-10-03 DIAGNOSIS — L821 Other seborrheic keratosis: Secondary | ICD-10-CM | POA: Diagnosis not present

## 2012-10-03 DIAGNOSIS — L578 Other skin changes due to chronic exposure to nonionizing radiation: Secondary | ICD-10-CM | POA: Diagnosis not present

## 2012-10-18 DIAGNOSIS — M76899 Other specified enthesopathies of unspecified lower limb, excluding foot: Secondary | ICD-10-CM | POA: Diagnosis not present

## 2012-10-18 DIAGNOSIS — IMO0002 Reserved for concepts with insufficient information to code with codable children: Secondary | ICD-10-CM | POA: Diagnosis not present

## 2012-10-18 DIAGNOSIS — Q762 Congenital spondylolisthesis: Secondary | ICD-10-CM | POA: Diagnosis not present

## 2012-10-18 DIAGNOSIS — M545 Low back pain: Secondary | ICD-10-CM | POA: Diagnosis not present

## 2012-12-07 ENCOUNTER — Other Ambulatory Visit: Payer: Self-pay

## 2012-12-07 DIAGNOSIS — Z1231 Encounter for screening mammogram for malignant neoplasm of breast: Secondary | ICD-10-CM

## 2012-12-13 DIAGNOSIS — J387 Other diseases of larynx: Secondary | ICD-10-CM | POA: Diagnosis not present

## 2012-12-13 DIAGNOSIS — H8110 Benign paroxysmal vertigo, unspecified ear: Secondary | ICD-10-CM | POA: Diagnosis not present

## 2012-12-26 ENCOUNTER — Ambulatory Visit
Admission: RE | Admit: 2012-12-26 | Discharge: 2012-12-26 | Disposition: A | Discharge status: Home / Self Care | Payer: Medicare Other | Source: Ambulatory Visit

## 2012-12-26 DIAGNOSIS — Z1231 Encounter for screening mammogram for malignant neoplasm of breast: Secondary | ICD-10-CM | POA: Diagnosis not present

## 2013-01-23 DIAGNOSIS — H1030 Unspecified acute conjunctivitis, unspecified eye: Secondary | ICD-10-CM | POA: Diagnosis not present

## 2013-02-02 DIAGNOSIS — Z23 Encounter for immunization: Secondary | ICD-10-CM | POA: Diagnosis not present

## 2013-02-08 DIAGNOSIS — H8110 Benign paroxysmal vertigo, unspecified ear: Secondary | ICD-10-CM | POA: Diagnosis not present

## 2013-03-20 DIAGNOSIS — R3129 Other microscopic hematuria: Secondary | ICD-10-CM | POA: Diagnosis not present

## 2013-03-20 DIAGNOSIS — N390 Urinary tract infection, site not specified: Secondary | ICD-10-CM | POA: Diagnosis not present

## 2013-05-10 ENCOUNTER — Other Ambulatory Visit: Payer: Self-pay | Admitting: Family Medicine

## 2013-05-10 DIAGNOSIS — R51 Headache: Secondary | ICD-10-CM | POA: Diagnosis not present

## 2013-05-10 DIAGNOSIS — G479 Sleep disorder, unspecified: Secondary | ICD-10-CM | POA: Diagnosis not present

## 2013-05-10 DIAGNOSIS — E782 Mixed hyperlipidemia: Secondary | ICD-10-CM | POA: Diagnosis not present

## 2013-05-10 DIAGNOSIS — I10 Essential (primary) hypertension: Secondary | ICD-10-CM | POA: Diagnosis not present

## 2013-05-10 DIAGNOSIS — Z23 Encounter for immunization: Secondary | ICD-10-CM | POA: Diagnosis not present

## 2013-05-10 LAB — CBC
HGB: 13.1 g/dL
WBC: 6.8
platelet count: 314

## 2013-05-10 LAB — COMPREHENSIVE METABOLIC PANEL
ALT: 16 U/L (ref 7–35)
AST: 15 U/L
Alkaline Phosphatase: 87 U/L
BILIRUBIN TOTAL: 0.4 mg/dL
Creat: 1.05
GLUCOSE: 94
POTASSIUM: 4.5 mmol/L
SODIUM: 140 mmol/L (ref 137–147)

## 2013-05-10 LAB — LIPID PANEL
Cholesterol: 218 mg/dL — AB (ref 0–200)
HDL: 40 mg/dL (ref 35–70)
LDL CALC: 142
Triglycerides: 183

## 2013-05-17 ENCOUNTER — Ambulatory Visit
Admission: RE | Admit: 2013-05-17 | Discharge: 2013-05-17 | Disposition: A | Discharge status: Home / Self Care | Payer: Medicare Other | Source: Ambulatory Visit | Attending: Family Medicine | Admitting: Family Medicine

## 2013-05-17 DIAGNOSIS — R51 Headache: Secondary | ICD-10-CM | POA: Diagnosis not present

## 2013-05-17 MED ORDER — GADOBENATE DIMEGLUMINE 529 MG/ML IV SOLN
14.0000 mL | Freq: Once | INTRAVENOUS | Status: AC | PRN
  Administered 2013-05-17: 14 mL via INTRAVENOUS

## 2013-05-27 DIAGNOSIS — K1120 Sialoadenitis, unspecified: Secondary | ICD-10-CM

## 2013-05-27 HISTORY — DX: Sialoadenitis, unspecified: K11.20

## 2013-06-13 ENCOUNTER — Emergency Department (INDEPENDENT_AMBULATORY_CARE_PROVIDER_SITE_OTHER)
Admission: EM | Admit: 2013-06-13 | Discharge: 2013-06-13 | Disposition: A | Discharge status: Home / Self Care | Payer: Medicare Other | Source: Home / Self Care | Attending: Emergency Medicine | Admitting: Emergency Medicine

## 2013-06-13 ENCOUNTER — Encounter (HOSPITAL_COMMUNITY): Payer: Self-pay | Admitting: Emergency Medicine

## 2013-06-13 DIAGNOSIS — K1120 Sialoadenitis, unspecified: Secondary | ICD-10-CM

## 2013-06-13 NOTE — ED Provider Notes (Signed)
CSN: [id number]     Arrival date & time 06/13/13  9179 History   First MD Initiated Contact with Patient 06/13/13 [phone]     Chief Complaint  Patient presents with  . Lymphadenopathy     (Consider location/radiation/quality/duration/timing/severity/associated sxs/prior Treatment) HPI Comments: Patient presents with 48 hour history of tender right neck swelling at angle of mandible. Denies injury or fever. No previous episodes. Pain described as throbbing discomfort. No dental issues, sore throat, or difficulty breathing speaking or swallowing.   The history is provided by the patient.    History reviewed. No pertinent past medical history. History reviewed. No pertinent past surgical history. No family history on file. History  Substance Use Topics  . Smoking status: Never Smoker   . Smokeless tobacco: Not on file  . Alcohol Use: No   OB History   Grav Para Term Preterm Abortions TAB SAB Ect Mult Living                 Review of Systems  All other systems reviewed and are negative.      Allergies  Review of patient's allergies indicates no known allergies.  Home Medications  No current outpatient prescriptions on file. BP 118/76  Pulse 104  Temp(Src) 99.8 F (37.7 C) (Oral)  Resp 18  SpO2 96% Physical Exam  Nursing note and vitals reviewed. Constitutional: She is oriented to person, place, and time. She appears well-developed and well-nourished. No distress.  HENT:  Head: Normocephalic and atraumatic.  Right Ear: Hearing, tympanic membrane, external ear and ear canal normal.  Left Ear: Hearing, external ear and ear canal normal.  Nose: Nose normal.  Mouth/Throat: Uvula is midline, oropharynx is clear and moist and mucous membranes are normal. No oral lesions. No trismus in the jaw. Normal dentition. No dental abscesses, uvula swelling, lacerations or dental caries.  Eyes: Conjunctivae are normal. Right eye exhibits no discharge. Left eye exhibits no discharge. No  scleral icterus.  Neck: Trachea normal, normal range of motion, full passive range of motion without pain and phonation normal. Neck supple.    Cardiovascular: Normal rate.   Pulmonary/Chest: Effort normal and breath sounds normal.  Neurological: She is alert and oriented to person, place, and time.  Skin: Skin is warm and dry. No rash noted.  Psychiatric: She has a normal mood and affect. Her behavior is normal.    ED Course  Procedures (including critical care time) Labs Review Labs Reviewed - No data to display Imaging Review No results found.    MDM   Final diagnoses:  None  Exam consistent with sialadenitis. Patient is established with Dr. Redmond Baseman at [facility]. Contacted his office and patient to be seen in office today at 10:10am for further management/treatment.     [place], [place] 06/13/13 [phone]

## 2013-06-13 NOTE — ED Notes (Addendum)
Pt c/o swollen gland to the right of neck/throat since Monday Denies cold sxs, f/vn/d Pain is 3/10. Alert w/no signs of acute distress.

## 2013-06-13 NOTE — Discharge Instructions (Signed)
Parotitis °Parotitis is soreness and swelling (inflammation) of one or both parotid glands. The parotid glands produce saliva. They are located on each side of the face, below and in front of the earlobes. The saliva produced comes out of tiny openings (ducts) inside the cheeks. In most cases, parotitis goes away over time or with treatment. If your parotitis is caused by certain long-term (chronic) diseases, it may come back again.  °CAUSES  °Parotitis can be caused by: °· Viral infections. Mumps is one viral infection that can cause parotitis. °· Bacterial infections. °· Blockage of the salivary ducts due to a salivary stone. °· Narrowing of the salivary ducts. °· Swelling of the salivary ducts. °· Dehydration. °· Autoimmune conditions, such as sarcoidosis or Sjogren's syndrome. °· Air from activities such as scuba diving, glass blowing, or playing an instrument (rare). °· Human immunodeficiency virus (HIV) or acquired immunodeficiency syndrome (AIDS). °· Tuberculosis. °SYMPTOMS  °· The ears may appear to be pushed up and out from their normal position. °· Redness (erythema) of the skin over the parotid glands. °· Pain and tenderness over the parotid glands. °· Swelling in the parotid gland area. °· Yellowish-white fluid (pus) coming from the ducts inside the cheeks. °· Dry mouth. °· Bad taste in the mouth. °DIAGNOSIS  °Your caregiver may determine that you have parotitis based on your symptoms and a physical exam. A sample of fluid may also be taken from the parotid gland and tested to find the cause of your infection. X-rays or computed tomography (CT) scans may be taken if your caregiver thinks you might have a salivary stone blocking your salivary duct. °TREATMENT  °Treatment varies depending upon the cause of your parotitis. If your parotitis is caused by mumps, no treatment is needed. The condition will go away on its own after 7 to 10 days. In other cases, treatment may include: °· Antibiotics if your  infection was caused by bacteria. °· Pain medicines. °· Gland massage. °· Eating sour candy to increase your saliva production. °· Removal of salivary stones. Your caregiver may flush stones out with fluids or remove them with tweezers. °· Surgery to remove the parotid glands. °HOME CARE INSTRUCTIONS  °· If you were given antibiotics, take them as directed. Finish them even if you start to feel better. °· Put warm compresses on the sore area. °· Only take over-the-counter or prescription medicines for pain, discomfort, or fever as directed by your caregiver. °· Drink enough fluids to keep your urine clear or pale yellow. °SEEK IMMEDIATE MEDICAL CARE IF:  °· You have increasing pain or swelling that is not controlled with medicine. °· You have a fever. °MAKE SURE YOU: °· Understand these instructions. °· Will watch your condition. °· Will get help right away if you are not doing well or get worse. °Document Released: 10/02/2001 Document Revised: 07/05/2011 Document Reviewed: 03/08/2011 °ExitCare® Patient Information ©2014 [employer]. ° °

## 2013-06-13 NOTE — ED Provider Notes (Signed)
Medical screening examination/treatment/procedure(s) were performed by non-physician practitioner and as supervising physician I was immediately available for consultation/collaboration.  Philipp Deputy, M.D.  Harden Mo, MD 06/13/13 [phone]

## 2013-09-09 ENCOUNTER — Encounter: Payer: Self-pay | Admitting: Family Medicine

## 2013-09-09 DIAGNOSIS — R140 Abdominal distension (gaseous): Secondary | ICD-10-CM | POA: Insufficient documentation

## 2013-09-11 ENCOUNTER — Ambulatory Visit (INDEPENDENT_AMBULATORY_CARE_PROVIDER_SITE_OTHER): Payer: Medicare Other | Admitting: Family Medicine

## 2013-09-11 ENCOUNTER — Telehealth: Payer: Self-pay | Admitting: Family Medicine

## 2013-09-11 ENCOUNTER — Encounter: Payer: Self-pay | Admitting: Family Medicine

## 2013-09-11 VITALS — BP 142/82 | HR 84 | Temp 98.7°F | Ht 61.5 in | Wt 154.5 lb

## 2013-09-11 DIAGNOSIS — M129 Arthropathy, unspecified: Secondary | ICD-10-CM

## 2013-09-11 DIAGNOSIS — Z2911 Encounter for prophylactic immunotherapy for respiratory syncytial virus (RSV): Secondary | ICD-10-CM | POA: Diagnosis not present

## 2013-09-11 DIAGNOSIS — K589 Irritable bowel syndrome without diarrhea: Secondary | ICD-10-CM

## 2013-09-11 DIAGNOSIS — M949 Disorder of cartilage, unspecified: Secondary | ICD-10-CM | POA: Diagnosis not present

## 2013-09-11 DIAGNOSIS — K7689 Other specified diseases of liver: Secondary | ICD-10-CM | POA: Diagnosis not present

## 2013-09-11 DIAGNOSIS — M899 Disorder of bone, unspecified: Secondary | ICD-10-CM

## 2013-09-11 DIAGNOSIS — G4700 Insomnia, unspecified: Secondary | ICD-10-CM

## 2013-09-11 DIAGNOSIS — M1990 Unspecified osteoarthritis, unspecified site: Secondary | ICD-10-CM

## 2013-09-11 DIAGNOSIS — I10 Essential (primary) hypertension: Secondary | ICD-10-CM

## 2013-09-11 DIAGNOSIS — E1169 Type 2 diabetes mellitus with other specified complication: Secondary | ICD-10-CM | POA: Insufficient documentation

## 2013-09-11 DIAGNOSIS — M5137 Other intervertebral disc degeneration, lumbosacral region: Secondary | ICD-10-CM | POA: Diagnosis not present

## 2013-09-11 DIAGNOSIS — M8580 Other specified disorders of bone density and structure, unspecified site: Secondary | ICD-10-CM | POA: Insufficient documentation

## 2013-09-11 DIAGNOSIS — M5136 Other intervertebral disc degeneration, lumbar region: Secondary | ICD-10-CM

## 2013-09-11 DIAGNOSIS — K219 Gastro-esophageal reflux disease without esophagitis: Secondary | ICD-10-CM

## 2013-09-11 DIAGNOSIS — J387 Other diseases of larynx: Secondary | ICD-10-CM

## 2013-09-11 DIAGNOSIS — M51379 Other intervertebral disc degeneration, lumbosacral region without mention of lumbar back pain or lower extremity pain: Secondary | ICD-10-CM | POA: Diagnosis not present

## 2013-09-11 DIAGNOSIS — K760 Fatty (change of) liver, not elsewhere classified: Secondary | ICD-10-CM

## 2013-09-11 DIAGNOSIS — E785 Hyperlipidemia, unspecified: Secondary | ICD-10-CM | POA: Insufficient documentation

## 2013-09-11 DIAGNOSIS — Z23 Encounter for immunization: Secondary | ICD-10-CM

## 2013-09-11 NOTE — Progress Notes (Signed)
Pre visit review using our clinic review tool, if applicable. No additional management support is needed unless otherwise documented below in the visit note. 

## 2013-09-11 NOTE — Assessment & Plan Note (Signed)
Per pt mildly elevated last check. Will recheck next fasting blood work.

## 2013-09-11 NOTE — Assessment & Plan Note (Signed)
rec trial of osteobiflex. Compliant with cal/vit D.

## 2013-09-11 NOTE — Addendum Note (Signed)
Addended by: Royann Shivers A on: 09/11/2013 03:11 PM   Modules accepted: Orders

## 2013-09-11 NOTE — Assessment & Plan Note (Signed)
Chronic, stable. Continue meds. 

## 2013-09-11 NOTE — Patient Instructions (Addendum)
Look into osteo-bi flex or over the counter glucosamine to help with joint aches. Good to meet you today, return in next 2-3 months fasting for blood work and afterwards for [employer] visit. zostavax (shingles shot) today.  Insomnia Insomnia is frequent trouble falling and/or staying asleep. Insomnia can be a long term problem or a short term problem. Both are common. Insomnia can be a short term problem when the wakefulness is related to a certain stress or worry. Long term insomnia is often related to ongoing stress during waking hours and/or poor sleeping habits. Overtime, sleep deprivation itself can make the problem worse. Every little thing feels more severe because you are overtired and your ability to cope is decreased. CAUSES   Stress, anxiety, and depression.  Poor sleeping habits.  Distractions such as TV in the bedroom.  Naps close to bedtime.  Engaging in emotionally charged conversations before bed.  Technical reading before sleep.  Alcohol and other sedatives. They may make the problem worse. They can hurt normal sleep patterns and normal dream activity.  Stimulants such as caffeine for several hours prior to bedtime.  Pain syndromes and shortness of breath can cause insomnia.  Exercise late at night.  Changing time zones may cause sleeping problems (jet lag). It is sometimes helpful to have someone observe your sleeping patterns. They should look for periods of not breathing during the night (sleep apnea). They should also look to see how long those periods last. If you live alone or observers are uncertain, you can also be observed at a sleep clinic where your sleep patterns will be professionally monitored. Sleep apnea requires a checkup and treatment. Give your caregivers your medical history. Give your caregivers observations your family has made about your sleep.  SYMPTOMS   Not feeling rested in the morning.  Anxiety and restlessness at  bedtime.  Difficulty falling and staying asleep. TREATMENT   Your caregiver may prescribe treatment for an underlying medical disorders. Your caregiver can give advice or help if you are using alcohol or other drugs for self-medication. Treatment of underlying problems will usually eliminate insomnia problems.  Medications can be prescribed for short time use. They are generally not recommended for lengthy use.  Over-the-counter sleep medicines are not recommended for lengthy use. They can be habit forming.  You can promote easier sleeping by making lifestyle changes such as:  Using relaxation techniques that help with breathing and reduce muscle tension.  Exercising earlier in the day.  Changing your diet and the time of your last meal. No night time snacks.  Establish a regular time to go to bed.  Counseling can help with stressful problems and worry.  Soothing music and white noise may be helpful if there are background noises you cannot remove.  Stop tedious detailed work at least one hour before bedtime. HOME CARE INSTRUCTIONS   Keep a diary. Inform your caregiver about your progress. This includes any medication side effects. See your caregiver regularly. Take note of:  Times when you are asleep.  Times when you are awake during the night.  The quality of your sleep.  How you feel the next day. This information will help your caregiver care for you.  Get out of bed if you are still awake after 15 minutes. Read or do some quiet activity. Keep the lights down. Wait until you feel sleepy and go back to bed.  Keep regular sleeping and waking hours. Avoid naps.  Exercise regularly.  Avoid distractions at bedtime.  Distractions include watching television or engaging in any intense or detailed activity like attempting to balance the household checkbook.  Develop a bedtime ritual. Keep a familiar routine of bathing, brushing your teeth, climbing into bed at the same time  each night, listening to soothing music. Routines increase the success of falling to sleep faster.  Use relaxation techniques. This can be using breathing and muscle tension release routines. It can also include visualizing peaceful scenes. You can also help control troubling or intruding thoughts by keeping your mind occupied with boring or repetitive thoughts like the old concept of counting sheep. You can make it more creative like imagining planting one beautiful flower after another in your backyard garden.  During your day, work to eliminate stress. When this is not possible use some of the previous suggestions to help reduce the anxiety that accompanies stressful situations. MAKE SURE YOU:   Understand these instructions.  Will watch your condition.  Will get help right away if you are not doing well or get worse. Document Released: 04/09/2000 Document Revised: 07/05/2011 Document Reviewed: 05/10/2007 [facility] Patient Information 2014 [place].

## 2013-09-11 NOTE — Assessment & Plan Note (Signed)
Stable on omeprazole bid dosing.

## 2013-09-11 NOTE — Telephone Encounter (Signed)
Relevant patient education assigned to patient using Emmi. ° °

## 2013-09-11 NOTE — Progress Notes (Signed)
BP 142/82  Pulse 84  Temp(Src) 98.7 F (37.1 C) (Oral)  Ht 5' 1.5" (1.562 m)  Wt 154 lb 8 oz (70.081 kg)  BMI 28.72 kg/m2   CC: new pt to establish care  Subjective:    Patient ID: Carrie Lucas, female    DOB: 08-12-1940, 73 y.o.   MRN: [id number]  HPI: Carrie Lucas is a 73 y.o. female presenting on 09/11/2013 for Establish Care   Dyslipidemia - on fish oil per prior PCP.  Will recheck FLP next fasting blood work.  HTN - compliant with amlodipine 5mg  daily and losartan 100mg  daily. Noticing increased trouble with dry mouth.  Insomnia - ambien 5mg  nightly. Trouble both falling and staying asleep. Does not like taking medication. Has refill but doesn't want to take. Hopeful to come off medication.  Osteopenia - compliant with cal/vit D daily.  Preventative: No recent CPE DEXA - 2011 mild osteopenia (T -1.2 R femur) Flu - 2011 Pneumovax 2004.  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  Allergies and medications reviewed and updated. No current outpatient prescriptions on file prior to visit.   No current facility-administered medications on file prior to visit.    Review of Systems Per HPI unless specifically indicated above    Objective:    BP 142/82  Pulse 84  Temp(Src) 98.7 F (37.1 C) (Oral)  Ht 5' 1.5" (1.562 m)  Wt 154 lb 8 oz (70.081 kg)  BMI 28.72 kg/m2  Physical Exam  Nursing note and vitals reviewed. Constitutional: She is oriented to person, place, and time. She appears well-developed and well-nourished. No distress.  HENT:  Head: Normocephalic and atraumatic.  Mouth/Throat: Uvula is midline, oropharynx is clear and moist and mucous membranes are normal.  No oropharyngeal exudate, posterior oropharyngeal edema or posterior oropharyngeal erythema.  Eyes: Conjunctivae and EOM are normal. Pupils are equal, round, and reactive to light. No scleral icterus.  Neck: Normal range of motion. Neck supple. No thyromegaly present.  Cardiovascular: Normal rate, regular rhythm, normal heart sounds and intact distal pulses.   No murmur heard. Pulses:      Radial pulses are 2+ on the right side, and 2+ on the left side.  Pulmonary/Chest: Effort normal and breath sounds normal. No respiratory distress. She has no wheezes. She has no rales.  Musculoskeletal: Normal range of motion. She exhibits no edema.  Lymphadenopathy:    She has no cervical adenopathy.  Neurological: She is alert and oriented to person, place, and time.  CN grossly intact, station and gait intact  Skin: Skin is warm and dry. No rash noted.  Psychiatric: She has a normal mood and affect. Her behavior is normal. Judgment and thought content normal.   No results found for this or any previous visit.    Assessment & Plan:   Problem List Items Addressed This Visit   DDD (degenerative disc disease), lumbar   Hepatic steatosis     Reviewed dx with patient.    Osteopenia     rec trial of osteobiflex. Compliant with cal/vit D.    HTN (hypertension)     Chronic, stable. Continue meds.    Relevant Medications      losartan (COZAAR) 100 MG tablet  amLODipine (NORVASC) 5 MG tablet   HLD (hyperlipidemia)     Per pt mildly elevated last check. Will recheck next fasting blood work.    Relevant Medications      losartan (COZAAR) 100 MG tablet      amLODipine (NORVASC) 5 MG tablet   LPRD (laryngopharyngeal reflux disease)     Stable on omeprazole bid dosing.    Insomnia - Primary     Pt desires to come off ambien. Discussed sleep hygiene measures. Pt will try behavior/routine changes prior to retrial of sleeping aid. Consider silenor for sleep maintenance insomnia.    IBS  (irritable bowel syndrome)   Relevant Medications      omeprazole (PRILOSEC) 40 MG capsule   Arthritis       Follow up plan: Return in about 3 months (around 12/12/2013), or as needed, for annual exam, prior fasting for blood work.

## 2013-09-11 NOTE — Assessment & Plan Note (Signed)
Reviewed dx with patient.

## 2013-09-11 NOTE — Assessment & Plan Note (Signed)
Pt desires to come off ambien. Discussed sleep hygiene measures. Pt will try behavior/routine changes prior to retrial of sleeping aid. Consider silenor for sleep maintenance insomnia.

## 2013-09-14 ENCOUNTER — Encounter: Payer: Self-pay | Admitting: *Deleted

## 2013-10-15 DIAGNOSIS — H52209 Unspecified astigmatism, unspecified eye: Secondary | ICD-10-CM | POA: Diagnosis not present

## 2013-10-15 DIAGNOSIS — Z961 Presence of intraocular lens: Secondary | ICD-10-CM | POA: Diagnosis not present

## 2013-10-15 DIAGNOSIS — H179 Unspecified corneal scar and opacity: Secondary | ICD-10-CM | POA: Diagnosis not present

## 2013-12-01 ENCOUNTER — Other Ambulatory Visit: Payer: Self-pay | Admitting: Family Medicine

## 2013-12-01 DIAGNOSIS — I10 Essential (primary) hypertension: Secondary | ICD-10-CM

## 2013-12-01 DIAGNOSIS — E785 Hyperlipidemia, unspecified: Secondary | ICD-10-CM

## 2013-12-01 DIAGNOSIS — K760 Fatty (change of) liver, not elsewhere classified: Secondary | ICD-10-CM

## 2013-12-01 DIAGNOSIS — M8580 Other specified disorders of bone density and structure, unspecified site: Secondary | ICD-10-CM

## 2013-12-03 ENCOUNTER — Other Ambulatory Visit: Payer: Self-pay

## 2013-12-03 DIAGNOSIS — Z1231 Encounter for screening mammogram for malignant neoplasm of breast: Secondary | ICD-10-CM

## 2013-12-05 ENCOUNTER — Other Ambulatory Visit (INDEPENDENT_AMBULATORY_CARE_PROVIDER_SITE_OTHER): Payer: Medicare Other

## 2013-12-05 DIAGNOSIS — K7689 Other specified diseases of liver: Secondary | ICD-10-CM | POA: Diagnosis not present

## 2013-12-05 DIAGNOSIS — E785 Hyperlipidemia, unspecified: Secondary | ICD-10-CM | POA: Diagnosis not present

## 2013-12-05 DIAGNOSIS — K760 Fatty (change of) liver, not elsewhere classified: Secondary | ICD-10-CM

## 2013-12-05 DIAGNOSIS — M949 Disorder of cartilage, unspecified: Secondary | ICD-10-CM | POA: Diagnosis not present

## 2013-12-05 DIAGNOSIS — M899 Disorder of bone, unspecified: Secondary | ICD-10-CM | POA: Diagnosis not present

## 2013-12-05 DIAGNOSIS — I10 Essential (primary) hypertension: Secondary | ICD-10-CM | POA: Diagnosis not present

## 2013-12-05 DIAGNOSIS — R7989 Other specified abnormal findings of blood chemistry: Secondary | ICD-10-CM

## 2013-12-05 DIAGNOSIS — H179 Unspecified corneal scar and opacity: Secondary | ICD-10-CM | POA: Diagnosis not present

## 2013-12-05 DIAGNOSIS — H11009 Unspecified pterygium of unspecified eye: Secondary | ICD-10-CM | POA: Diagnosis not present

## 2013-12-05 DIAGNOSIS — M8580 Other specified disorders of bone density and structure, unspecified site: Secondary | ICD-10-CM

## 2013-12-05 LAB — TSH: TSH: 2.71 u[IU]/mL (ref 0.35–4.50)

## 2013-12-05 LAB — COMPREHENSIVE METABOLIC PANEL
ALT: 21 U/L (ref 0–35)
AST: 28 U/L (ref 0–37)
Albumin: 3.8 g/dL (ref 3.5–5.2)
Alkaline Phosphatase: 82 U/L (ref 39–117)
BILIRUBIN TOTAL: 0.6 mg/dL (ref 0.2–1.2)
BUN: 14 mg/dL (ref 6–23)
CO2: 24 mEq/L (ref 19–32)
CREATININE: 1.2 mg/dL (ref 0.4–1.2)
Calcium: 9.4 mg/dL (ref 8.4–10.5)
Chloride: 108 mEq/L (ref 96–112)
GFR: 47.2 mL/min — AB (ref >60.00)
GLUCOSE: 93 mg/dL (ref 70–99)
Potassium: 3.9 mEq/L (ref 3.5–5.1)
Sodium: 141 mEq/L (ref 135–145)
Total Protein: 6.8 g/dL (ref 6.0–8.3)

## 2013-12-05 LAB — LDL CHOLESTEROL, DIRECT: LDL DIRECT: 148.9 mg/dL

## 2013-12-05 LAB — LIPID PANEL
Cholesterol: 216 mg/dL — ABNORMAL HIGH (ref 0–200)
HDL: 30.6 mg/dL — AB (ref >39.00)
NonHDL: 185.4
TRIGLYCERIDES: 239 mg/dL — AB (ref 0.0–149.0)
Total CHOL/HDL Ratio: 7
VLDL: 47.8 mg/dL — ABNORMAL HIGH (ref 0.0–40.0)

## 2013-12-05 LAB — VITAMIN D 25 HYDROXY (VIT D DEFICIENCY, FRACTURES): VITD: 42.13 ng/mL (ref 30.00–100.00)

## 2013-12-12 ENCOUNTER — Ambulatory Visit (INDEPENDENT_AMBULATORY_CARE_PROVIDER_SITE_OTHER): Payer: Medicare Other | Admitting: Family Medicine

## 2013-12-12 ENCOUNTER — Encounter: Payer: Self-pay | Admitting: Family Medicine

## 2013-12-12 VITALS — BP 130/74 | HR 82 | Temp 98.1°F | Ht 61.5 in | Wt 157.5 lb

## 2013-12-12 DIAGNOSIS — E785 Hyperlipidemia, unspecified: Secondary | ICD-10-CM

## 2013-12-12 DIAGNOSIS — Z23 Encounter for immunization: Secondary | ICD-10-CM

## 2013-12-12 DIAGNOSIS — M899 Disorder of bone, unspecified: Secondary | ICD-10-CM

## 2013-12-12 DIAGNOSIS — N1832 Chronic kidney disease, stage 3b: Secondary | ICD-10-CM | POA: Insufficient documentation

## 2013-12-12 DIAGNOSIS — I10 Essential (primary) hypertension: Secondary | ICD-10-CM

## 2013-12-12 DIAGNOSIS — M949 Disorder of cartilage, unspecified: Secondary | ICD-10-CM

## 2013-12-12 DIAGNOSIS — N183 Chronic kidney disease, stage 3 unspecified: Secondary | ICD-10-CM | POA: Insufficient documentation

## 2013-12-12 DIAGNOSIS — M159 Polyosteoarthritis, unspecified: Secondary | ICD-10-CM | POA: Diagnosis not present

## 2013-12-12 DIAGNOSIS — K7689 Other specified diseases of liver: Secondary | ICD-10-CM

## 2013-12-12 DIAGNOSIS — Z0000 Encounter for general adult medical examination without abnormal findings: Secondary | ICD-10-CM | POA: Insufficient documentation

## 2013-12-12 DIAGNOSIS — K760 Fatty (change of) liver, not elsewhere classified: Secondary | ICD-10-CM

## 2013-12-12 DIAGNOSIS — M8580 Other specified disorders of bone density and structure, unspecified site: Secondary | ICD-10-CM

## 2013-12-12 DIAGNOSIS — M150 Primary generalized (osteo)arthritis: Secondary | ICD-10-CM

## 2013-12-12 NOTE — Assessment & Plan Note (Signed)
Reviewed latest dexa, as well as cal/vit D use.

## 2013-12-12 NOTE — Assessment & Plan Note (Signed)
Chronic, stable. Continue regimen. 

## 2013-12-12 NOTE — Progress Notes (Signed)
Pre visit review using our clinic review tool, if applicable. No additional management support is needed unless otherwise documented below in the visit note. 

## 2013-12-12 NOTE — Assessment & Plan Note (Signed)
FLP elevated recently - reviewed #s. Discussed healthy diet and lifestyle choices to improve trig and HDL and LDL individually. Recheck FLP in 6 months, then decide on addition of statin given fmhx. Discussed fish oil vs actual fish intake.

## 2013-12-12 NOTE — Assessment & Plan Note (Signed)
Reviewed dx with patient. anticiapte HS.

## 2013-12-12 NOTE — Assessment & Plan Note (Signed)
Reviewed dx with patient. Per pt, longstanding. Discussed importance of good hydration status as well as avoiding significant NSAID use.

## 2013-12-12 NOTE — Progress Notes (Signed)
BP 130/74  Pulse 82  Temp(Src) 98.1 F (36.7 C) (Oral)  Ht 5' 1.5" (1.562 m)  Wt 157 lb 8 oz (71.442 kg)  BMI 29.28 kg/m2   CC: medicare wellness  Subjective:    Patient ID: Carrie Lucas, female    DOB: 10-31-1940, 73 y.o.   MRN: [id number]  HPI: Carrie Lucas is a 73 y.o. female presenting on 12/12/2013 for Annual Exam   Wt Readings from Last 3 Encounters:  12/12/13 157 lb 8 oz (71.442 kg)  09/11/13 154 lb 8 oz (70.081 kg)   Body mass index is 29.28 kg/(m^2).  R 3rd PIP has lost flexibility. Now 2nd PIP is causing pain. Told arthritis causing joint damage. Will call to schedule appt with Kuzma's office.  Passes hearing screen. Recent vision exam. Denies depression/anhedonia/sadness and falls  Preventative:  Well woman with GYN Dr. Orpah Greek, last saw 2 yrs ago. S/p hysterectomy and oophorectomy Mammogram - scheduled for 12/2013. +fmhx COLONOSCOPY Date: 8 30 12  TAx1, diverticulosis, rpt 5 yrs (Outlaw) DEXA - 2011 mild osteopenia (T -1.2 R femur)  Flu - today Pneumovax 08/2005. prevnar - today. Tdap 04/2013  zostavax - 08/2013 Advanced directives: does not have set up. Requests packet. Would want Fritz Pickerel or daughter to be POA.  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 Orpah Greek Derm - Dr. Sherrye Payor Dentist - Dr. Claudina Lick  Relevant past medical, surgical, family and social history reviewed and updated as indicated.  Allergies and medications reviewed and updated. Current Outpatient Prescriptions on File Prior to Visit  Medication Sig  . amLODipine (NORVASC) 5 MG tablet Take 5 mg by mouth daily.  . calcium carbonate (OS-CAL) 600 MG TABS tablet Take 1,200 mg by mouth daily with breakfast.  . losartan (COZAAR) 100 MG tablet Take 100 mg by mouth daily.  . Multiple Vitamin  (MULTIVITAMIN) tablet Take 1 tablet by mouth daily.  Marland Kitchen omeprazole (PRILOSEC) 40 MG capsule Take 40 mg by mouth 2 (two) times daily.  . Omega-3 Fatty Acids (FISH OIL) 1000 MG CAPS Take 2 capsules by mouth daily.   No current facility-administered medications on file prior to visit.    Review of Systems Per HPI unless specifically indicated above    Objective:    BP 130/74  Pulse 82  Temp(Src) 98.1 F (36.7 C) (Oral)  Ht 5' 1.5" (1.562 m)  Wt 157 lb 8 oz (71.442 kg)  BMI 29.28 kg/m2  Physical Exam  Nursing note and vitals reviewed. Constitutional: She is oriented to person, place, and time. She appears well-developed and well-nourished. No distress.  HENT:  Head: Normocephalic and atraumatic.  Right Ear: Hearing, tympanic membrane, external ear and ear canal normal.  Left Ear: Hearing, tympanic membrane, external ear and ear canal normal.  Nose: Nose normal.  Mouth/Throat: Uvula is midline, oropharynx is clear and moist and mucous membranes are normal. No oropharyngeal exudate, posterior oropharyngeal edema or posterior oropharyngeal erythema.  Eyes: Conjunctivae and EOM are normal. Pupils are equal, round, and reactive to light. No scleral icterus.  Neck: Normal range of motion. Neck supple. No thyromegaly present.  Cardiovascular: Normal rate, regular rhythm, normal heart sounds and intact distal pulses.   No murmur heard. Pulses:      Radial pulses are 2+ on the right side, and 2+  on the left side.  Pulmonary/Chest: Effort normal and breath sounds normal. No respiratory distress. She has no wheezes. She has no rales.  Abdominal: Soft. Bowel sounds are normal. She exhibits no distension and no mass. There is no tenderness. There is no rebound and no guarding.  Musculoskeletal: Normal range of motion. She exhibits no edema.  Lymphadenopathy:    She has no cervical adenopathy.  Neurological: She is alert and oriented to person, place, and time.  CN grossly intact, station and  gait intact Recall 3/3  Calculation 5/5 serial 7s  Skin: Skin is warm and dry. No rash noted.  Psychiatric: She has a normal mood and affect. Her behavior is normal. Judgment and thought content normal.   Results for orders placed in visit on 12/12/13  HM DEXA SCAN      Result Value Ref Range   HM Dexa Scan mild osteopenia        Assessment & Plan:   Problem List Items Addressed This Visit   CKD (chronic kidney disease) stage 3, GFR 30-59 ml/min     Reviewed dx with patient. Per pt, longstanding. Discussed importance of good hydration status as well as avoiding significant NSAID use.    Hepatic steatosis     Reviewed dx with patient. anticiapte HS.    HLD (hyperlipidemia) (Chronic)     FLP elevated recently - reviewed #s. Discussed healthy diet and lifestyle choices to improve trig and HDL and LDL individually. Recheck FLP in 6 months, then decide on addition of statin given fmhx. Discussed fish oil vs actual fish intake.    Relevant Orders      Lipid panel   HTN (hypertension)     Chronic, stable. Continue regimen.    Medicare annual wellness visit, subsequent - Primary     I have personally reviewed the Medicare Annual Wellness questionnaire and have noted 1. The patient's medical and social history 2. Their use of alcohol, tobacco or illicit drugs 3. Their current medications and supplements 4. The patient's functional ability including ADL's, fall risks, home safety risks and hearing or visual impairment. 5. Diet and physical activity 6. Evidence for depression or mood disorders The patients weight, height, BMI have been recorded in the chart.  Hearing and vision has been addressed. I have made referrals, counseling and provided education to the patient based review of the above and I have provided the pt with a written personalized care plan for preventive services. Provider list updated - see scanned questionairre. Advanced directives discussed: requests handout which  was provided.  Reviewed preventative protocols and updated unless pt declined.     Osteoarthritis     Pt will call hand surgery to f/u with them on possible IP joint replacement    Osteopenia     Reviewed latest dexa, as well as cal/vit D use.        Follow up plan: Return in about 1 year (around 12/13/2014), or as needed, for medicare wellness.

## 2013-12-12 NOTE — Assessment & Plan Note (Signed)
Pt will call hand surgery to f/u with them on possible IP joint replacement

## 2013-12-12 NOTE — Patient Instructions (Addendum)
prevnar and flu shots today. Call to schedule appointment with hand doctor Let's work on diet and lifestyle changes as discussed today. Return in 6 months for lab visit to check cholesterol Return in 1 year for next medicare wellness visit, sooner if needed. Good to see you today, call us with quesitons.

## 2013-12-12 NOTE — Addendum Note (Signed)
Addended by: Royann Shivers A on: 12/12/2013 10:30 AM   Modules accepted: Orders

## 2013-12-12 NOTE — Assessment & Plan Note (Signed)
I have personally reviewed the Medicare Annual Wellness questionnaire and have noted 1. The patient's medical and social history 2. Their use of alcohol, tobacco or illicit drugs 3. Their current medications and supplements 4. The patient's functional ability including ADL's, fall risks, home safety risks and hearing or visual impairment. 5. Diet and physical activity 6. Evidence for depression or mood disorders The patients weight, height, BMI have been recorded in the chart.  Hearing and vision has been addressed. I have made referrals, counseling and provided education to the patient based review of the above and I have provided the pt with a written personalized care plan for preventive services. Provider list updated - see scanned questionairre. Advanced directives discussed: requests handout which was provided.  Reviewed preventative protocols and updated unless pt declined.

## 2013-12-19 DIAGNOSIS — M19049 Primary osteoarthritis, unspecified hand: Secondary | ICD-10-CM | POA: Diagnosis not present

## 2013-12-27 ENCOUNTER — Ambulatory Visit
Admission: RE | Admit: 2013-12-27 | Discharge: 2013-12-27 | Disposition: A | Discharge status: Home / Self Care | Payer: Medicare Other | Source: Ambulatory Visit

## 2013-12-27 ENCOUNTER — Encounter: Payer: Self-pay | Admitting: *Deleted

## 2013-12-27 DIAGNOSIS — Z1231 Encounter for screening mammogram for malignant neoplasm of breast: Secondary | ICD-10-CM | POA: Diagnosis not present

## 2013-12-27 LAB — HM MAMMOGRAPHY: HM Mammogram: NORMAL

## 2014-03-11 ENCOUNTER — Other Ambulatory Visit: Payer: Self-pay

## 2014-03-11 MED ORDER — LOSARTAN POTASSIUM 100 MG PO TABS: 100.0000 mg | ORAL_TABLET | Freq: Every day | ORAL | Status: DC

## 2014-03-11 MED ORDER — AMLODIPINE BESYLATE 5 MG PO TABS: 5.0000 mg | ORAL_TABLET | Freq: Every day | ORAL | Status: DC

## 2014-03-11 NOTE — Telephone Encounter (Signed)
Pt request 90 day refill for amlodipine and losartan to [facility]; pt advised done.

## 2014-03-13 DIAGNOSIS — Z9889 Other specified postprocedural states: Secondary | ICD-10-CM | POA: Insufficient documentation

## 2014-03-13 DIAGNOSIS — H11003 Unspecified pterygium of eye, bilateral: Secondary | ICD-10-CM | POA: Diagnosis not present

## 2014-04-26 HISTORY — PX: MOHS SURGERY: SUR867

## 2014-05-06 DIAGNOSIS — I10 Essential (primary) hypertension: Secondary | ICD-10-CM | POA: Diagnosis not present

## 2014-05-06 DIAGNOSIS — H11003 Unspecified pterygium of eye, bilateral: Secondary | ICD-10-CM | POA: Insufficient documentation

## 2014-05-06 DIAGNOSIS — M1990 Unspecified osteoarthritis, unspecified site: Secondary | ICD-10-CM | POA: Diagnosis not present

## 2014-05-06 DIAGNOSIS — Z9889 Other specified postprocedural states: Secondary | ICD-10-CM | POA: Diagnosis not present

## 2014-05-09 DIAGNOSIS — J387 Other diseases of larynx: Secondary | ICD-10-CM | POA: Diagnosis not present

## 2014-05-23 DIAGNOSIS — I10 Essential (primary) hypertension: Secondary | ICD-10-CM | POA: Diagnosis not present

## 2014-05-23 DIAGNOSIS — Z9889 Other specified postprocedural states: Secondary | ICD-10-CM | POA: Diagnosis not present

## 2014-05-23 DIAGNOSIS — I129 Hypertensive chronic kidney disease with stage 1 through stage 4 chronic kidney disease, or unspecified chronic kidney disease: Secondary | ICD-10-CM | POA: Diagnosis not present

## 2014-05-23 DIAGNOSIS — M8580 Other specified disorders of bone density and structure, unspecified site: Secondary | ICD-10-CM | POA: Diagnosis not present

## 2014-05-23 DIAGNOSIS — E785 Hyperlipidemia, unspecified: Secondary | ICD-10-CM | POA: Diagnosis not present

## 2014-05-23 DIAGNOSIS — H2703 Aphakia, bilateral: Secondary | ICD-10-CM | POA: Diagnosis not present

## 2014-05-23 DIAGNOSIS — K219 Gastro-esophageal reflux disease without esophagitis: Secondary | ICD-10-CM | POA: Diagnosis not present

## 2014-05-23 DIAGNOSIS — Z85828 Personal history of other malignant neoplasm of skin: Secondary | ICD-10-CM | POA: Diagnosis not present

## 2014-05-23 DIAGNOSIS — Z90711 Acquired absence of uterus with remaining cervical stump: Secondary | ICD-10-CM | POA: Diagnosis not present

## 2014-05-23 DIAGNOSIS — H11002 Unspecified pterygium of left eye: Secondary | ICD-10-CM | POA: Diagnosis not present

## 2014-05-23 DIAGNOSIS — K760 Fatty (change of) liver, not elsewhere classified: Secondary | ICD-10-CM | POA: Diagnosis not present

## 2014-05-23 DIAGNOSIS — N183 Chronic kidney disease, stage 3 (moderate): Secondary | ICD-10-CM | POA: Diagnosis not present

## 2014-05-23 DIAGNOSIS — Z86010 Personal history of colonic polyps: Secondary | ICD-10-CM | POA: Diagnosis not present

## 2014-05-24 DIAGNOSIS — Z4889 Encounter for other specified surgical aftercare: Secondary | ICD-10-CM | POA: Diagnosis not present

## 2014-05-24 DIAGNOSIS — Z9889 Other specified postprocedural states: Secondary | ICD-10-CM | POA: Diagnosis not present

## 2014-06-14 ENCOUNTER — Other Ambulatory Visit (INDEPENDENT_AMBULATORY_CARE_PROVIDER_SITE_OTHER): Payer: Medicare Other

## 2014-06-14 DIAGNOSIS — E785 Hyperlipidemia, unspecified: Secondary | ICD-10-CM

## 2014-06-14 DIAGNOSIS — N183 Chronic kidney disease, stage 3 (moderate): Secondary | ICD-10-CM | POA: Diagnosis not present

## 2014-06-14 LAB — LIPID PANEL
Cholesterol: 204 mg/dL — ABNORMAL HIGH (ref 0–200)
HDL: 40 mg/dL (ref >39.00)
LDL Cholesterol: 139 mg/dL — ABNORMAL HIGH (ref 0–99)
NONHDL: 164
Total CHOL/HDL Ratio: 5
Triglycerides: 123 mg/dL (ref 0.0–149.0)
VLDL: 24.6 mg/dL (ref 0.0–40.0)

## 2014-06-14 LAB — RENAL FUNCTION PANEL
Albumin: 4 g/dL (ref 3.5–5.2)
BUN: 21 mg/dL (ref 6–23)
CO2: 26 mEq/L (ref 19–32)
Calcium: 9.4 mg/dL (ref 8.4–10.5)
Chloride: 107 mEq/L (ref 96–112)
Creatinine, Ser: 1.21 mg/dL — ABNORMAL HIGH (ref 0.40–1.20)
GFR: 46.24 mL/min — AB (ref >60.00)
Glucose, Bld: 99 mg/dL (ref 70–99)
PHOSPHORUS: 3.9 mg/dL (ref 2.3–4.6)
POTASSIUM: 4.2 meq/L (ref 3.5–5.1)
Sodium: 140 mEq/L (ref 135–145)

## 2014-06-18 ENCOUNTER — Telehealth: Payer: Self-pay

## 2014-06-18 MED ORDER — ATORVASTATIN CALCIUM 20 MG PO TABS: 20.0000 mg | ORAL_TABLET | Freq: Every day | ORAL | Status: DC

## 2014-06-18 NOTE — Telephone Encounter (Signed)
Sent in

## 2014-06-18 NOTE — Telephone Encounter (Signed)
Pt received my chart message and voiced understanding of my chart note;pt did not know how to send back response. Pt is willing to start a cholesterol med. [facility].pt will ck with pharmacy later today.

## 2014-07-03 ENCOUNTER — Other Ambulatory Visit: Payer: Self-pay | Admitting: Dermatology

## 2014-07-03 DIAGNOSIS — C44311 Basal cell carcinoma of skin of nose: Secondary | ICD-10-CM | POA: Diagnosis not present

## 2014-07-03 DIAGNOSIS — D1801 Hemangioma of skin and subcutaneous tissue: Secondary | ICD-10-CM | POA: Diagnosis not present

## 2014-07-03 DIAGNOSIS — D2272 Melanocytic nevi of left lower limb, including hip: Secondary | ICD-10-CM | POA: Diagnosis not present

## 2014-07-03 DIAGNOSIS — L821 Other seborrheic keratosis: Secondary | ICD-10-CM | POA: Diagnosis not present

## 2014-07-03 DIAGNOSIS — Z85820 Personal history of malignant melanoma of skin: Secondary | ICD-10-CM | POA: Diagnosis not present

## 2014-07-03 DIAGNOSIS — L814 Other melanin hyperpigmentation: Secondary | ICD-10-CM | POA: Diagnosis not present

## 2014-07-03 DIAGNOSIS — D225 Melanocytic nevi of trunk: Secondary | ICD-10-CM | POA: Diagnosis not present

## 2014-07-10 ENCOUNTER — Encounter: Payer: Self-pay | Admitting: Family Medicine

## 2014-07-10 ENCOUNTER — Ambulatory Visit (INDEPENDENT_AMBULATORY_CARE_PROVIDER_SITE_OTHER): Payer: Medicare Other | Admitting: Family Medicine

## 2014-07-10 VITALS — BP 138/70 | HR 83 | Temp 99.8°F | Ht 61.5 in | Wt 157.8 lb

## 2014-07-10 DIAGNOSIS — J069 Acute upper respiratory infection, unspecified: Secondary | ICD-10-CM | POA: Diagnosis not present

## 2014-07-10 DIAGNOSIS — B9789 Other viral agents as the cause of diseases classified elsewhere: Principal | ICD-10-CM

## 2014-07-10 MED ORDER — HYDROCODONE-HOMATROPINE 5-1.5 MG/5ML PO SYRP: 5.0000 mL | ORAL_SOLUTION | Freq: Three times a day (TID) | ORAL | Status: DC | PRN

## 2014-07-10 NOTE — Progress Notes (Signed)
Pre visit review using our clinic review tool, if applicable. No additional management support is needed unless otherwise documented below in the visit note. 

## 2014-07-10 NOTE — Patient Instructions (Signed)
I think you have a viral upper respiratory infection  Take the hycodan cough medicine with caution of sedation  Drink fluids and rest  Tylenol for fever when needed  Zyrtec or allegra for runny nose if needed   Update if not starting to improve in a week or if worsening  - especially if facial pain

## 2014-07-10 NOTE — Progress Notes (Signed)
Subjective:    Patient ID: Carrie Lucas, female    DOB: [birth year], 74 y.o.   MRN: [id number]  HPI Here for uri/sinus symptoms   Started getting sick with a cold on Friday  Scratchy throat then ST Then bad cough - hard to control (coughed all night)-sometimes productive  Sore from coughing   Low grade temp  Highest 100.4 Did not get a flu shot  Tired but not achey   Nasal congestion and runniness Now nasal drainage is yellow   Otc: took a sudafed like decongestant-not much help  Cough drops help a bit   Patient Active Problem List   Diagnosis Date Noted  . Medicare annual wellness visit, subsequent 12/12/2013  . CKD (chronic kidney disease) stage 3, GFR 30-59 ml/min 12/12/2013  . DDD (degenerative disc disease), lumbar   . Hepatic steatosis   . Osteopenia   . HTN (hypertension)   . HLD (hyperlipidemia)   . LPRD (laryngopharyngeal reflux disease)   . Insomnia   . IBS (irritable bowel syndrome)   . Osteoarthritis   . Abdominal bloating 09/09/2013   Past Medical History  Diagnosis Date  . DDD (degenerative disc disease), lumbar 2014    severe facet degeneration L4/5, mod spinal setnosis, mild foraminal stenosis  . Hepatic steatosis 2012    mild by CT  . Colon polyp   . Osteopenia 2011    by DEXA  . Sialadenitis 05/2013    acute [facility])  . HTN (hypertension)   . HLD (hyperlipidemia)   . LPRD (laryngopharyngeal reflux disease)     [facility])  . Insomnia   . Headache   . History of melanoma     face (Dr Luetta Nutting)  . IBS (irritable bowel syndrome)   . Arthritis     R middle finger, chronic arthralgias   Past Surgical History  Procedure Laterality Date  . Colonoscopy  8 30 12     TAx1, diverticulosis, rpt 5 yrs (Outlaw)  . Esophagogastroduodenoscopy  8 30 12     WNL  . Abi  06/2010    WNL  . Dexa  2011    mild osteopenia (T -1.2 R femur)  . Esi  2014    L L4 transforaminal  . Bladder suspension    . Back surgery  1995    lumbar  . Carpal tunnel release  Bilateral 2000  . Bilateral oophorectomy  2007    ovarian cysts  . Tonsillectomy  Childhood  . Vaginal hysterectomy      bladder drop   History  Substance Use Topics  . Smoking status: Never Smoker   . Smokeless tobacco: Never Used  . Alcohol Use: No   Family History  Problem Relation Age of Onset  . Cancer Sister     breast  . Pulmonary fibrosis Mother 50  . Diabetes Father   . Hypertension Father   . Stroke Father 67  . Hyperlipidemia Father   . CAD Sister    No Known Allergies Current Outpatient Prescriptions on File Prior to Visit  Medication Sig Dispense Refill  . amLODipine (NORVASC) 5 MG tablet Take 1 tablet (5 mg total) by mouth daily. 90 tablet 1  . atorvastatin (LIPITOR) 20 MG tablet Take 1 tablet (20 mg total) by mouth daily. 30 tablet 6  . calcium carbonate (OS-CAL) 600 MG TABS tablet Take 1,200 mg by mouth daily with breakfast.    . Cholecalciferol (VITAMIN D) 2000 UNITS CAPS Take 1 capsule by mouth daily.    Marland Kitchen  losartan (COZAAR) 100 MG tablet Take 1 tablet (100 mg total) by mouth daily. 90 tablet 1  . Misc Natural Products (OSTEO BI-FLEX TRIPLE STRENGTH PO) Take 1 capsule by mouth daily.    . Multiple Vitamin (MULTIVITAMIN) tablet Take 1 tablet by mouth daily.    . Omega-3 Fatty Acids (FISH OIL) 1000 MG CAPS Take 2 capsules by mouth daily.    Marland Kitchen omeprazole (PRILOSEC) 40 MG capsule Take 40 mg by mouth 2 (two) times daily.     No current facility-administered medications on file prior to visit.        Review of Systems Review of Systems  Constitutional: Negative for pos for elevated temp and malaise ENT pos for cong/rhinorrhea/st / neg for facial pain  Eyes: Negative for pain and visual disturbance.  Respiratory: Negative for wheeze  and shortness of breath.   Cardiovascular: Negative for cp or palpitations    Gastrointestinal: Negative for nausea, diarrhea and constipation.  Genitourinary: Negative for urgency and frequency.  Skin: Negative for pallor or  rash   Neurological: Negative for weakness, light-headedness, numbness and headaches.  Hematological: Negative for adenopathy. Does not bruise/bleed easily.  Psychiatric/Behavioral: Negative for dysphoric mood. The patient is not nervous/anxious.         Objective:   Physical Exam  Constitutional: She appears well-developed and well-nourished. No distress.  overwt and well appearing   HENT:  Head: Normocephalic and atraumatic.  Right Ear: External ear normal.  Left Ear: External ear normal.  Mouth/Throat: Oropharynx is clear and moist. No oropharyngeal exudate.  Nares are injected and congested  No sinus tenderness Clear rhinorrhea Post nasal drip   Eyes: Conjunctivae and EOM are normal. Pupils are equal, round, and reactive to light. Right eye exhibits no discharge. Left eye exhibits no discharge.  Neck: Normal range of motion. Neck supple.  Cardiovascular: Normal rate and regular rhythm.   Pulmonary/Chest: Effort normal and breath sounds normal. No respiratory distress. She has no wheezes. She has no rales. She exhibits no tenderness.  Lymphadenopathy:    She has no cervical adenopathy.  Skin: Skin is warm and dry. No rash noted.  Psychiatric: She has a normal mood and affect.          Assessment & Plan:   Problem List Items Addressed This Visit      Respiratory   Viral URI with cough - Primary    Re assuring exam  Disc symptomatic care - see instructions on AVS  Hycodan with cough with caution of sedation Update if not starting to improve in a week or if worsening

## 2014-07-11 NOTE — Assessment & Plan Note (Signed)
Re assuring exam  Disc symptomatic care - see instructions on AVS  Hycodan with cough with caution of sedation Update if not starting to improve in a week or if worsening

## 2014-07-31 DIAGNOSIS — C44311 Basal cell carcinoma of skin of nose: Secondary | ICD-10-CM | POA: Diagnosis not present

## 2014-07-31 DIAGNOSIS — Z85828 Personal history of other malignant neoplasm of skin: Secondary | ICD-10-CM | POA: Diagnosis not present

## 2014-08-14 ENCOUNTER — Other Ambulatory Visit: Payer: Self-pay | Admitting: *Deleted

## 2014-08-14 MED ORDER — ATORVASTATIN CALCIUM 20 MG PO TABS: 20.0000 mg | ORAL_TABLET | Freq: Every day | ORAL | Status: DC

## 2014-09-02 DIAGNOSIS — H52202 Unspecified astigmatism, left eye: Secondary | ICD-10-CM | POA: Diagnosis not present

## 2014-09-02 DIAGNOSIS — Z961 Presence of intraocular lens: Secondary | ICD-10-CM | POA: Diagnosis not present

## 2014-09-02 DIAGNOSIS — H1789 Other corneal scars and opacities: Secondary | ICD-10-CM | POA: Diagnosis not present

## 2014-09-02 DIAGNOSIS — H11003 Unspecified pterygium of eye, bilateral: Secondary | ICD-10-CM | POA: Diagnosis not present

## 2014-09-16 ENCOUNTER — Other Ambulatory Visit: Payer: Self-pay | Admitting: Family Medicine

## 2014-11-25 ENCOUNTER — Other Ambulatory Visit: Payer: Self-pay

## 2014-11-25 DIAGNOSIS — Z1231 Encounter for screening mammogram for malignant neoplasm of breast: Secondary | ICD-10-CM

## 2014-12-07 ENCOUNTER — Other Ambulatory Visit: Payer: Self-pay | Admitting: Family Medicine

## 2014-12-07 DIAGNOSIS — I10 Essential (primary) hypertension: Secondary | ICD-10-CM

## 2014-12-07 DIAGNOSIS — N183 Chronic kidney disease, stage 3 unspecified: Secondary | ICD-10-CM

## 2014-12-07 DIAGNOSIS — K760 Fatty (change of) liver, not elsewhere classified: Secondary | ICD-10-CM

## 2014-12-07 DIAGNOSIS — E785 Hyperlipidemia, unspecified: Secondary | ICD-10-CM

## 2014-12-09 ENCOUNTER — Other Ambulatory Visit (INDEPENDENT_AMBULATORY_CARE_PROVIDER_SITE_OTHER): Payer: Medicare Other

## 2014-12-09 DIAGNOSIS — E785 Hyperlipidemia, unspecified: Secondary | ICD-10-CM | POA: Diagnosis not present

## 2014-12-09 DIAGNOSIS — I10 Essential (primary) hypertension: Secondary | ICD-10-CM | POA: Diagnosis not present

## 2014-12-09 DIAGNOSIS — K760 Fatty (change of) liver, not elsewhere classified: Secondary | ICD-10-CM | POA: Diagnosis not present

## 2014-12-09 DIAGNOSIS — N183 Chronic kidney disease, stage 3 unspecified: Secondary | ICD-10-CM

## 2014-12-09 LAB — LIPID PANEL
CHOL/HDL RATIO: 4
CHOLESTEROL: 120 mg/dL (ref 0–200)
HDL: 32.1 mg/dL — ABNORMAL LOW (ref >39.00)
LDL CALC: 61 mg/dL (ref 0–99)
NonHDL: 88.08
Triglycerides: 137 mg/dL (ref 0.0–149.0)
VLDL: 27.4 mg/dL (ref 0.0–40.0)

## 2014-12-09 LAB — COMPREHENSIVE METABOLIC PANEL
ALBUMIN: 4 g/dL (ref 3.5–5.2)
ALT: 20 U/L (ref 0–35)
AST: 18 U/L (ref 0–37)
Alkaline Phosphatase: 94 U/L (ref 39–117)
BUN: 16 mg/dL (ref 6–23)
CALCIUM: 9.5 mg/dL (ref 8.4–10.5)
CHLORIDE: 109 meq/L (ref 96–112)
CO2: 26 meq/L (ref 19–32)
Creatinine, Ser: 1.16 mg/dL (ref 0.40–1.20)
GFR: 48.48 mL/min — ABNORMAL LOW (ref >60.00)
Glucose, Bld: 103 mg/dL — ABNORMAL HIGH (ref 70–99)
POTASSIUM: 4.3 meq/L (ref 3.5–5.1)
SODIUM: 143 meq/L (ref 135–145)
Total Bilirubin: 0.5 mg/dL (ref 0.2–1.2)
Total Protein: 6.9 g/dL (ref 6.0–8.3)

## 2014-12-09 LAB — CBC WITH DIFFERENTIAL/PLATELET
BASOS PCT: 0.7 % (ref 0.0–3.0)
Basophils Absolute: 0 10*3/uL (ref 0.0–0.1)
EOS PCT: 3 % (ref 0.0–5.0)
Eosinophils Absolute: 0.2 10*3/uL (ref 0.0–0.7)
HEMATOCRIT: 38.4 % (ref 36.0–46.0)
HEMOGLOBIN: 12.9 g/dL (ref 12.0–15.0)
LYMPHS PCT: 46.8 % — AB (ref 12.0–46.0)
Lymphs Abs: 3.4 10*3/uL (ref 0.7–4.0)
MCHC: 33.5 g/dL (ref 30.0–36.0)
MCV: 90.4 fl (ref 78.0–100.0)
Monocytes Absolute: 0.6 10*3/uL (ref 0.1–1.0)
Monocytes Relative: 7.8 % (ref 3.0–12.0)
NEUTROS ABS: 3 10*3/uL (ref 1.4–7.7)
Neutrophils Relative %: 41.7 % — ABNORMAL LOW (ref 43.0–77.0)
Platelets: 288 10*3/uL (ref 150.0–400.0)
RBC: 4.24 Mil/uL (ref 3.87–5.11)
RDW: 13.3 % (ref 11.5–15.5)
WBC: 7.2 10*3/uL (ref 4.0–10.5)

## 2014-12-10 LAB — PARATHYROID HORMONE, INTACT (NO CA): PTH: 57 pg/mL (ref 14–64)

## 2014-12-16 ENCOUNTER — Encounter: Payer: Self-pay | Admitting: Family Medicine

## 2014-12-16 ENCOUNTER — Ambulatory Visit (INDEPENDENT_AMBULATORY_CARE_PROVIDER_SITE_OTHER): Payer: Medicare Other | Admitting: Family Medicine

## 2014-12-16 VITALS — BP 118/68 | HR 76 | Temp 98.6°F | Wt 159.0 lb

## 2014-12-16 DIAGNOSIS — J387 Other diseases of larynx: Secondary | ICD-10-CM

## 2014-12-16 DIAGNOSIS — N183 Chronic kidney disease, stage 3 unspecified: Secondary | ICD-10-CM

## 2014-12-16 DIAGNOSIS — R140 Abdominal distension (gaseous): Secondary | ICD-10-CM

## 2014-12-16 DIAGNOSIS — R0989 Other specified symptoms and signs involving the circulatory and respiratory systems: Secondary | ICD-10-CM | POA: Diagnosis not present

## 2014-12-16 DIAGNOSIS — M8580 Other specified disorders of bone density and structure, unspecified site: Secondary | ICD-10-CM

## 2014-12-16 DIAGNOSIS — I6529 Occlusion and stenosis of unspecified carotid artery: Secondary | ICD-10-CM | POA: Insufficient documentation

## 2014-12-16 DIAGNOSIS — Z7189 Other specified counseling: Secondary | ICD-10-CM | POA: Insufficient documentation

## 2014-12-16 DIAGNOSIS — I10 Essential (primary) hypertension: Secondary | ICD-10-CM

## 2014-12-16 DIAGNOSIS — Z0000 Encounter for general adult medical examination without abnormal findings: Secondary | ICD-10-CM

## 2014-12-16 DIAGNOSIS — K219 Gastro-esophageal reflux disease without esophagitis: Secondary | ICD-10-CM

## 2014-12-16 DIAGNOSIS — E785 Hyperlipidemia, unspecified: Secondary | ICD-10-CM

## 2014-12-16 DIAGNOSIS — K760 Fatty (change of) liver, not elsewhere classified: Secondary | ICD-10-CM

## 2014-12-16 HISTORY — DX: Occlusion and stenosis of unspecified carotid artery: I65.29

## 2014-12-16 MED ORDER — AMLODIPINE BESYLATE 5 MG PO TABS: 5.0000 mg | ORAL_TABLET | Freq: Every day | ORAL | Status: DC

## 2014-12-16 MED ORDER — LOSARTAN POTASSIUM 100 MG PO TABS: 100.0000 mg | ORAL_TABLET | Freq: Every day | ORAL | Status: DC

## 2014-12-16 NOTE — Assessment & Plan Note (Signed)
Prn omeprazole  

## 2014-12-16 NOTE — Assessment & Plan Note (Signed)
Regularly takes calcium/vit D.

## 2014-12-16 NOTE — Assessment & Plan Note (Signed)
Discussed staying well hydrated. Avoid NSAIDs. Tylenol ok. Consider SPEP and TSH next visit if not recently done.

## 2014-12-16 NOTE — Assessment & Plan Note (Signed)

## 2014-12-16 NOTE — Assessment & Plan Note (Signed)
Chronic. Reviewed #s with patient - marked improvement in all but HDL with addition of lipitor. Conitnue. May stop fish oil and encouraged increased fatty fish in diet.

## 2014-12-16 NOTE — Assessment & Plan Note (Signed)
Gas X prn. Trial probiotics. Anticipate IBS related.

## 2014-12-16 NOTE — Patient Instructions (Addendum)
Advanced directive packet provided today. meds refilled today. Try daily probiotic like activia or pill form (phillips colon health or align).  We will order carotid ultrasound Return as needed or in 1 year for next medicare wellness visit  Health Maintenance Adopting a healthy lifestyle and getting preventive care can go a long way to promote health and wellness. Talk with your health care provider about what schedule of regular examinations is right for you. This is a good chance for you to check in with your provider about disease prevention and staying healthy. In between checkups, there are plenty of things you can do on your own. Experts have done a lot of research about which lifestyle changes and preventive measures are most likely to keep you healthy. Ask your health care provider for more information. WEIGHT AND DIET  Eat a healthy diet  Be sure to include plenty of vegetables, fruits, low-fat dairy products, and lean protein.  Do not eat a lot of foods high in solid fats, added sugars, or salt.  Get regular exercise. This is one of the most important things you can do for your health.  Most adults should exercise for at least 150 minutes each week. The exercise should increase your heart rate and make you sweat (moderate-intensity exercise).  Most adults should also do strengthening exercises at least twice a week. This is in addition to the moderate-intensity exercise.  Maintain a healthy weight  Body mass index (BMI) is a measurement that can be used to identify possible weight problems. It estimates body fat based on height and weight. Your health care provider can help determine your BMI and help you achieve or maintain a healthy weight.  For females 46 years of age and older:   A BMI below 18.5 is considered underweight.  A BMI of 18.5 to 24.9 is normal.  A BMI of 25 to 29.9 is considered overweight.  A BMI of 30 and above is considered obese.  Watch levels of  cholesterol and blood lipids  You should start having your blood tested for lipids and cholesterol at 74 years of age, then have this test every 5 years.  You may need to have your cholesterol levels checked more often if:  Your lipid or cholesterol levels are high.  You are older than 74 years of age.  You are at high risk for heart disease.  CANCER SCREENING   Lung Cancer  Lung cancer screening is recommended for adults 85-47 years old who are at high risk for lung cancer because of a history of smoking.  A yearly low-dose CT scan of the lungs is recommended for people who:  Currently smoke.  Have quit within the past 15 years.  Have at least a 30-pack-year history of smoking. A pack year is smoking an average of one pack of cigarettes a day for 1 year.  Yearly screening should continue until it has been 15 years since you quit.  Yearly screening should stop if you develop a health problem that would prevent you from having lung cancer treatment.  Breast Cancer  Practice breast self-awareness. This means understanding how your breasts normally appear and feel.  It also means doing regular breast self-exams. Let your health care provider know about any changes, no matter how small.  If you are in your 20s or 30s, you should have a clinical breast exam (CBE) by a health care provider every 1-3 years as part of a regular health exam.  If you are  9 or older, have a CBE every year. Also consider having a breast X-ray (mammogram) every year.  If you have a family history of breast cancer, talk to your health care provider about genetic screening.  If you are at high risk for breast cancer, talk to your health care provider about having an MRI and a mammogram every year.  Breast cancer gene (BRCA) assessment is recommended for women who have family members with BRCA-related cancers. BRCA-related cancers include:  Breast.  Ovarian.  Tubal.  Peritoneal  cancers.  Results of the assessment will determine the need for genetic counseling and BRCA1 and BRCA2 testing. Cervical Cancer Routine pelvic examinations to screen for cervical cancer are no longer recommended for nonpregnant women who are considered low risk for cancer of the pelvic organs (ovaries, uterus, and vagina) and who do not have symptoms. A pelvic examination may be necessary if you have symptoms including those associated with pelvic infections. Ask your health care provider if a screening pelvic exam is right for you.   The Pap test is the screening test for cervical cancer for women who are considered at risk.  If you had a hysterectomy for a problem that was not cancer or a condition that could lead to cancer, then you no longer need Pap tests.  If you are older than 65 years, and you have had normal Pap tests for the past 10 years, you no longer need to have Pap tests.  If you have had past treatment for cervical cancer or a condition that could lead to cancer, you need Pap tests and screening for cancer for at least 20 years after your treatment.  If you no longer get a Pap test, assess your risk factors if they change (such as having a new sexual partner). This can affect whether you should start being screened again.  Some women have medical problems that increase their chance of getting cervical cancer. If this is the case for you, your health care provider may recommend more frequent screening and Pap tests.  The human papillomavirus (HPV) test is another test that may be used for cervical cancer screening. The HPV test looks for the virus that can cause cell changes in the cervix. The cells collected during the Pap test can be tested for HPV.  The HPV test can be used to screen women 34 years of age and older. Getting tested for HPV can extend the interval between normal Pap tests from three to five years.  An HPV test also should be used to screen women of any age who  have unclear Pap test results.  After 74 years of age, women should have HPV testing as often as Pap tests.  Colorectal Cancer  This type of cancer can be detected and often prevented.  Routine colorectal cancer screening usually begins at 74 years of age and continues through 74 years of age.  Your health care provider may recommend screening at an earlier age if you have risk factors for colon cancer.  Your health care provider may also recommend using home test kits to check for hidden blood in the stool.  A small camera at the end of a tube can be used to examine your colon directly (sigmoidoscopy or colonoscopy). This is done to check for the earliest forms of colorectal cancer.  Routine screening usually begins at age 61.  Direct examination of the colon should be repeated every 5-10 years through 74 years of age. However, you may need  to be screened more often if early forms of precancerous polyps or small growths are found. Skin Cancer  Check your skin from head to toe regularly.  Tell your health care provider about any new moles or changes in moles, especially if there is a change in a mole's shape or color.  Also tell your health care provider if you have a mole that is larger than the size of a pencil eraser.  Always use sunscreen. Apply sunscreen liberally and repeatedly throughout the day.  Protect yourself by wearing long sleeves, pants, a wide-brimmed hat, and sunglasses whenever you are outside. HEART DISEASE, DIABETES, AND HIGH BLOOD PRESSURE   Have your blood pressure checked at least every 1-2 years. High blood pressure causes heart disease and increases the risk of stroke.  If you are between 1 years and 60 years old, ask your health care provider if you should take aspirin to prevent strokes.  Have regular diabetes screenings. This involves taking a blood sample to check your fasting blood sugar level.  If you are at a normal weight and have a low risk for  diabetes, have this test once every three years after 75 years of age.  If you are overweight and have a high risk for diabetes, consider being tested at a younger age or more often. PREVENTING INFECTION  Hepatitis B  If you have a higher risk for hepatitis B, you should be screened for this virus. You are considered at high risk for hepatitis B if:  You were born in a country where hepatitis B is common. Ask your health care provider which countries are considered high risk.  Your parents were born in a high-risk country, and you have not been immunized against hepatitis B (hepatitis B vaccine).  You have HIV or AIDS.  You use needles to inject street drugs.  You live with someone who has hepatitis B.  You have had sex with someone who has hepatitis B.  You get hemodialysis treatment.  You take certain medicines for conditions, including cancer, organ transplantation, and autoimmune conditions. Hepatitis C  Blood testing is recommended for:  Everyone born from [birth year] through 1965.  Anyone with known risk factors for hepatitis C. Sexually transmitted infections (STIs)  You should be screened for sexually transmitted infections (STIs) including gonorrhea and chlamydia if:  You are sexually active and are younger than 75 years of age.  You are older than 74 years of age and your health care provider tells you that you are at risk for this type of infection.  Your sexual activity has changed since you were last screened and you are at an increased risk for chlamydia or gonorrhea. Ask your health care provider if you are at risk.  If you do not have HIV, but are at risk, it may be recommended that you take a prescription medicine daily to prevent HIV infection. This is called pre-exposure prophylaxis (PrEP). You are considered at risk if:  You are sexually active and do not regularly use condoms or know the HIV status of your partner(s).  You take drugs by injection.  You are  sexually active with a partner who has HIV. Talk with your health care provider about whether you are at high risk of being infected with HIV. If you choose to begin PrEP, you should first be tested for HIV. You should then be tested every 3 months for as long as you are taking PrEP.  PREGNANCY   If you are premenopausal  and you may become pregnant, ask your health care provider about preconception counseling.  If you may become pregnant, take 400 to 800 micrograms (mcg) of folic acid every day.  If you want to prevent pregnancy, talk to your health care provider about birth control (contraception). OSTEOPOROSIS AND MENOPAUSE   Osteoporosis is a disease in which the bones lose minerals and strength with aging. This can result in serious bone fractures. Your risk for osteoporosis can be identified using a bone density scan.  If you are 13 years of age or older, or if you are at risk for osteoporosis and fractures, ask your health care provider if you should be screened.  Ask your health care provider whether you should take a calcium or vitamin D supplement to lower your risk for osteoporosis.  Menopause may have certain physical symptoms and risks.  Hormone replacement therapy may reduce some of these symptoms and risks. Talk to your health care provider about whether hormone replacement therapy is right for you.  HOME CARE INSTRUCTIONS   Schedule regular health, dental, and eye exams.  Stay current with your immunizations.   Do not use any tobacco products including cigarettes, chewing tobacco, or electronic cigarettes.  If you are pregnant, do not drink alcohol.  If you are breastfeeding, limit how much and how often you drink alcohol.  Limit alcohol intake to no more than 1 drink per day for nonpregnant women. One drink equals 12 ounces of beer, 5 ounces of wine, or 1 ounces of hard liquor.  Do not use street drugs.  Do not share needles.  Ask your health care provider for  help if you need support or information about quitting drugs.  Tell your health care provider if you often feel depressed.  Tell your health care provider if you have ever been abused or do not feel safe at home. Document Released: 10/26/2010 Document Revised: 08/27/2013 Document Reviewed: 03/14/2013 [facility] Patient Information 2015 [place], [place]. This information is not intended to replace advice given to you by your health care provider. Make sure you discuss any questions you have with your health care provider.

## 2014-12-16 NOTE — Assessment & Plan Note (Signed)
Heard today - check [place]. Anticipate radiation of mild systolic murmur

## 2014-12-16 NOTE — Assessment & Plan Note (Signed)
Chronic, stable. Continue current regimen. 

## 2014-12-16 NOTE — Progress Notes (Signed)
BP 118/68 mmHg  Pulse 76  Temp(Src) 98.6 F (37 C) (Oral)  Wt 159 lb (72.122 kg)   CC: medicare wellness visit  Subjective:    Patient ID: Carrie Lucas, female    DOB: 04-04-1941, 74 y.o.   MRN: [id number]  HPI: Carrie Lucas is a 74 y.o. female presenting on 12/16/2014 for Annual Exam   abd bloating/gassiness - longstanding problem. Treated with gas X which helps. Has had extensive GI evaluation. Tried beano which didn't help. Has not tried probiotic.   Passes hearing screen. Vision exam with eye doctor. Denies depression/anhedonia/sadness and falls  Preventative:  COLONOSCOPY Date: 8 30 12  TAx1, diverticulosis, rpt 5 yrs (Outlaw) Well woman with GYN Dr. Orpah Greek, last saw 2 yrs ago. S/p hysterectomy and oophorectomy. May not return. Mammogram with 3D Tomo WNL 12/2013. +fmhx DEXA - 2011 mild osteopenia (T -1.2 R femur)  Flu - yearly Pneumovax 08/2005. prevnar - 11/2013 Tdap 04/2013  zostavax - 08/2013 Advanced directives: does not have set up. Requests packet. Would want Fritz Pickerel or daughter to be POA. Seat belt use discussed Sunscreen use discussed. No changing moles on skin.   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 Interim medical history since our last visit reviewed. Allergies and medications reviewed and updated. Current Outpatient Prescriptions on File Prior to Visit  Medication Sig  . atorvastatin (LIPITOR) 20 MG tablet Take 1 tablet (20 mg total) by mouth daily.  . calcium carbonate (OS-CAL) 600 MG TABS tablet Take 1,200 mg by mouth daily with breakfast.  . Cholecalciferol (VITAMIN D) 2000 UNITS CAPS Take 1 capsule by mouth daily.  . Misc Natural Products (OSTEO BI-FLEX TRIPLE  STRENGTH PO) Take 1 capsule by mouth daily.  . Multiple Vitamin (MULTIVITAMIN) tablet Take 1 tablet by mouth daily.  Marland Kitchen omeprazole (PRILOSEC) 40 MG capsule Take 40 mg by mouth 2 (two) times daily.   No current facility-administered medications on file prior to visit.    Review of Systems Per HPI unless specifically indicated above     Objective:    BP 118/68 mmHg  Pulse 76  Temp(Src) 98.6 F (37 C) (Oral)  Wt 159 lb (72.122 kg)  Wt Readings from Last 3 Encounters:  12/16/14 159 lb (72.122 kg)  07/10/14 157 lb 12.8 oz (71.578 kg)  12/12/13 157 lb 8 oz (71.442 kg)    Physical Exam  Constitutional: She is oriented to person, place, and time. She appears well-developed and well-nourished. No distress.  HENT:  Head: Normocephalic and atraumatic.  Right Ear: Hearing, tympanic membrane, external ear and ear canal normal.  Left Ear: Hearing, tympanic membrane, external ear and ear canal normal.  Nose: Nose normal.  Mouth/Throat: Uvula is midline, oropharynx is clear and moist and mucous membranes are normal. No oropharyngeal exudate, posterior oropharyngeal edema or posterior oropharyngeal erythema.  Eyes: Conjunctivae and EOM are normal. Pupils are equal, round, and reactive to light. No scleral icterus.  Neck: Normal range of motion. Neck supple. Carotid bruit is present (faint L sided). No thyromegaly present.  Cardiovascular: Normal rate, regular rhythm and intact distal pulses.   Murmur (faint 1/6 systolic best at LUSB) heard. Pulses:      Radial pulses are 2+ on the right side, and  2+ on the left side.  Pulmonary/Chest: Effort normal and breath sounds normal. No respiratory distress. She has no wheezes. She has no rales. Right breast exhibits no inverted nipple, no mass, no nipple discharge, no skin change and no tenderness. Left breast exhibits no inverted nipple, no mass, no nipple discharge, no skin change and no tenderness.  Abdominal: Soft. Bowel sounds are normal. She  exhibits no distension and no mass. There is no tenderness. There is no rebound and no guarding.  Musculoskeletal: Normal range of motion. She exhibits no edema.  Lymphadenopathy:       Head (right side): No submental, no submandibular, no tonsillar, no preauricular and no posterior auricular adenopathy present.       Head (left side): No submental, no submandibular, no tonsillar, no preauricular and no posterior auricular adenopathy present.    She has no cervical adenopathy.    She has no axillary adenopathy.       Right axillary: No lateral adenopathy present.       Left axillary: No lateral adenopathy present.      Right: No supraclavicular adenopathy present.       Left: No supraclavicular adenopathy present.  Neurological: She is alert and oriented to person, place, and time.  CN grossly intact, station and gait intact  Skin: Skin is warm and dry. No rash noted.  Psychiatric: She has a normal mood and affect. Her behavior is normal. Judgment and thought content normal.  Nursing note and vitals reviewed.  Results for orders placed or performed in visit on 12/09/14  Lipid panel  Result Value Ref Range   Cholesterol 120 0 - 200 mg/dL   Triglycerides 137.0 0.0 - 149.0 mg/dL   HDL 32.10 (L) >39.00 mg/dL   VLDL 27.4 0.0 - 40.0 mg/dL   LDL Cholesterol 61 0 - 99 mg/dL   Total CHOL/HDL Ratio 4    NonHDL 88.08   Comprehensive metabolic panel  Result Value Ref Range   Sodium 143 135 - 145 mEq/L   Potassium 4.3 3.5 - 5.1 mEq/L   Chloride 109 96 - 112 mEq/L   CO2 26 19 - 32 mEq/L   Glucose, Bld 103 (H) 70 - 99 mg/dL   BUN 16 6 - 23 mg/dL   Creatinine, Ser 1.16 0.40 - 1.20 mg/dL   Total Bilirubin 0.5 0.2 - 1.2 mg/dL   Alkaline Phosphatase 94 39 - 117 U/L   AST 18 0 - 37 U/L   ALT 20 0 - 35 U/L   Total Protein 6.9 6.0 - 8.3 g/dL   Albumin 4.0 3.5 - 5.2 g/dL   Calcium 9.5 8.4 - 10.5 mg/dL   GFR 48.48 (L) >60.00 mL/min  CBC with Differential/Platelet  Result Value Ref Range   WBC  7.2 4.0 - 10.5 K/uL   RBC 4.24 3.87 - 5.11 Mil/uL   Hemoglobin 12.9 12.0 - 15.0 g/dL   HCT 38.4 36.0 - 46.0 %   MCV 90.4 78.0 - 100.0 fl   MCHC 33.5 30.0 - 36.0 g/dL   RDW 13.3 11.5 - 15.5 %   Platelets 288.0 150.0 - 400.0 K/uL   Neutrophils Relative % 41.7 (L) 43.0 - 77.0 %   Lymphocytes Relative 46.8 (H) 12.0 - 46.0 %   Monocytes Relative 7.8 3.0 - 12.0 %   Eosinophils Relative 3.0 0.0 - 5.0 %   Basophils Relative 0.7 0.0 - 3.0 %   Neutro Abs 3.0 1.4 - 7.7 K/uL   Lymphs Abs 3.4 0.7 -  4.0 K/uL   Monocytes Absolute 0.6 0.1 - 1.0 K/uL   Eosinophils Absolute 0.2 0.0 - 0.7 K/uL   Basophils Absolute 0.0 0.0 - 0.1 K/uL  Parathyroid hormone, intact (no Ca)  Result Value Ref Range   PTH 57 14 - 64 pg/mL      Assessment & Plan:   Problem List Items Addressed This Visit    HLD (hyperlipidemia) (Chronic)    Chronic. Reviewed #s with patient - marked improvement in all but HDL with addition of lipitor. Conitnue. May stop fish oil and encouraged increased fatty fish in diet.      Relevant Medications   amLODipine (NORVASC) 5 MG tablet   losartan (COZAAR) 100 MG tablet   Abdominal bloating    Gas X prn. Trial probiotics. Anticipate IBS related.       Hepatic steatosis    H/o this by CT. LFTs normal.      Osteopenia    Regularly takes calcium/vit D.      HTN (hypertension)    Chronic, stable. Continue current regimen.      Relevant Medications   amLODipine (NORVASC) 5 MG tablet   losartan (COZAAR) 100 MG tablet   LPRD (laryngopharyngeal reflux disease)    Prn omeprazole.      Medicare annual wellness visit, subsequent - Primary    I have personally reviewed the Medicare Annual Wellness questionnaire and have noted 1. The patient's medical and social history 2. Their use of alcohol, tobacco or illicit drugs 3. Their current medications and supplements 4. The patient's functional ability including ADL's, fall risks, home safety risks and hearing or visual impairment.  Cognitive function has been assessed and addressed as indicated.  5. Diet and physical activity 6. Evidence for depression or mood disorders The patients weight, height, BMI have been recorded in the chart. I have made referrals, counseling and provided education to the patient based on review of the above and I have provided the pt with a written personalized care plan for preventive services. Provider list updated.. See scanned questionairre as needed for further documentation. Reviewed preventative protocols and updated unless pt declined.       CKD (chronic kidney disease) stage 3, GFR 30-59 ml/min    Discussed staying well hydrated. Avoid NSAIDs. Tylenol ok. Consider SPEP and TSH next visit if not recently done.      Advanced care planning/counseling discussion    Advanced directives: does not have set up. Requests packet. Would want Fritz Pickerel or daughter to be POA.      Carotid bruit    Heard today - check [place]. Anticipate radiation of mild systolic murmur      Relevant Orders   Carotid       Follow up plan: Return in about 1 year (around 12/16/2015), or as needed, for medicarewellness.

## 2014-12-16 NOTE — Progress Notes (Signed)
Pre visit review using our clinic review tool, if applicable. No additional management support is needed unless otherwise documented below in the visit note. 

## 2014-12-16 NOTE — Assessment & Plan Note (Signed)
H/o this by CT. LFTs normal.

## 2014-12-16 NOTE — Assessment & Plan Note (Signed)
Advanced directives: does not have set up. Requests packet. Would want Fritz Pickerel or daughter to be POA.

## 2014-12-19 ENCOUNTER — Telehealth: Payer: Self-pay | Admitting: Family Medicine

## 2014-12-19 ENCOUNTER — Ambulatory Visit (INDEPENDENT_AMBULATORY_CARE_PROVIDER_SITE_OTHER)
Admission: RE | Admit: 2014-12-19 | Discharge: 2014-12-19 | Disposition: A | Discharge status: Home / Self Care | Payer: Medicare Other | Source: Ambulatory Visit | Attending: Family Medicine | Admitting: Family Medicine

## 2014-12-19 ENCOUNTER — Encounter: Payer: Self-pay | Admitting: Family Medicine

## 2014-12-19 ENCOUNTER — Ambulatory Visit: Payer: Medicare Other | Admitting: Family Medicine

## 2014-12-19 ENCOUNTER — Ambulatory Visit (INDEPENDENT_AMBULATORY_CARE_PROVIDER_SITE_OTHER): Payer: Medicare Other | Admitting: Family Medicine

## 2014-12-19 VITALS — BP 142/68 | HR 75 | Temp 99.2°F | Ht 61.25 in | Wt 159.0 lb

## 2014-12-19 DIAGNOSIS — M25532 Pain in left wrist: Secondary | ICD-10-CM | POA: Diagnosis not present

## 2014-12-19 DIAGNOSIS — S99911A Unspecified injury of right ankle, initial encounter: Secondary | ICD-10-CM | POA: Insufficient documentation

## 2014-12-19 DIAGNOSIS — S6992XA Unspecified injury of left wrist, hand and finger(s), initial encounter: Secondary | ICD-10-CM | POA: Diagnosis not present

## 2014-12-19 DIAGNOSIS — S5291XA Unspecified fracture of right forearm, initial encounter for closed fracture: Secondary | ICD-10-CM

## 2014-12-19 DIAGNOSIS — M25571 Pain in right ankle and joints of right foot: Secondary | ICD-10-CM

## 2014-12-19 NOTE — Progress Notes (Signed)
Patient ID: Carrie Lucas, female   DOB: 1940-08-10, 74 y.o.   MRN: [id number]  Tommi Rumps, MD Phone: [phone]  Carrie Lucas is a 74 y.o. female who presents today for same day visit.  Right ankle and left wrist injury: patient reports that she fell this [place] Notes she was walking and tripped on a root. Denies LOC, head trauma, palpitations, chest pain, and dyspnea during this time. She notes she fell on her out stretched left hand. Notes she had immediate soreness following this in her left wrist. Notes later in the day developed bruising over her thenar eminence. Notes an aching sensation in her distal left fore arm, though no other pain. Notes that her right ankle was fine after the fall, though as the day progressed she develop swelling and pain in her ankle, She was initially able to walk on it, though it is painful at this time and can not walk on it. She has been icing the areas. No pain medication taken. No numbness or weakness anywhere.   PMH: nonsmoker.  History of osteopenia.   ROS see HPI  Objective  Physical Exam Filed Vitals:   12/19/14 1536  BP: 142/68  Pulse: 75  Temp: 99.2 F (37.3 C)    Physical Exam  Constitutional: She is well-developed, well-nourished, and in no distress.  HENT:  Head: Normocephalic and atraumatic.  Cardiovascular: Normal rate and regular rhythm.   Pulmonary/Chest: Effort normal and breath sounds normal.  Musculoskeletal:  LUE: left distal radial aspect of forearm with small area of swelling noted that is non-tender, there is no snuff box TTP, thenar eminence with bruising, though no tenderness, no hand tenderness, no wrist tenderness, 2+ radial pulse, hands WWP, full wrist ROM RUE: no swelling, tenderness, or bruising, 2+ radial pulse, hand WWP RLE: right lateral ankle distal to the lateral malleolus has swelling and this area is tender to touch, there is no malleolar  tenderness, she has full ROM, no navicular or 5th metatarsal head tenderness, no swelling of her foot, no pain in her foot, unable to bear weight, foot WWP LLE: no swelling, tenderness or bruising, full ROM, foot WWP  Neurological: She is alert.  5/5 strength in bilateral grip, plantar and dorsiflexion, inversion and eversion, sensation to light touch intact in bilateral UE and LE  Skin: She is not diaphoretic.     Assessment/Plan: Please see individual problem list.  Left wrist injury Patient with [place] injury earlier today. Has an area of swelling in the distal radius area. No snuff box tenderness. Is neurovascularly intact at this time. Will obtain XR left hand, wrist, and fore arm to evaluate for fracture. Is to elevate, ice, and rest. If fracture present will send to ortho.   Right ankle injury Likely lateral right ankle sprain, though given inability to bear weight could be fracture. Is neurovascularly intact. Will obtain ankle XR. Rest, ice, and elevation. Tylenol for discomfort. Given return precautions.     Orders Placed This Encounter  Procedures  . DG Ankle Complete Right    Standing Status: Future     Number of Occurrences: 1     Standing Expiration Date: 02/18/2016    Order Specific Question:  Reason for Exam (SYMPTOM  OR DIAGNOSIS REQUIRED)    Answer:  right ankle injury, please do mortis view    Order Specific Question:  Preferred imaging location?    Answer:  [facility]  .  DG Wrist Complete Left    Standing Status: Future     Number of Occurrences:      Standing Expiration Date: 02/18/2016    Order Specific Question:  Reason for Exam (SYMPTOM  OR DIAGNOSIS REQUIRED)    Answer:  right ankle injury, please do mortis view    Order Specific Question:  Preferred imaging location?    Answer:  [facility]  . DG Hand Complete Left    Standing Status: Future     Number of Occurrences:      Standing Expiration Date: 02/18/2016    Order Specific  Question:  Reason for Exam (SYMPTOM  OR DIAGNOSIS REQUIRED)    Answer:  right ankle injury, please do mortis view    Order Specific Question:  Preferred imaging location?    Answer:  [facility]  . DG Forearm Left    Standing Status: Future     Number of Occurrences:      Standing Expiration Date: 02/18/2016    Order Specific Question:  Reason for Exam (SYMPTOM  OR DIAGNOSIS REQUIRED)    Answer:  right ankle injury, please do mortis view    Order Specific Question:  Preferred imaging location?    Answer:  [name]

## 2014-12-19 NOTE — Patient Instructions (Addendum)
Nice to see you. Please go get the x-rays. You should elevate both extremities and can use ice on them.  You can take tylenol 650 mg every 6 hours as needed for pain.  If you develop numbness, weakness, worsening pain, please seek medical attention.

## 2014-12-19 NOTE — Assessment & Plan Note (Signed)
Patient with [place] injury earlier today. Has an area of swelling in the distal radius area. No snuff box tenderness. Is neurovascularly intact at this time. Will obtain XR left hand, wrist, and fore arm to evaluate for fracture. Is to elevate, ice, and rest. If fracture present will send to ortho.

## 2014-12-19 NOTE — Assessment & Plan Note (Signed)
Likely lateral right ankle sprain, though given inability to bear weight could be fracture. Is neurovascularly intact. Will obtain ankle XR. Rest, ice, and elevation. Tylenol for discomfort. Given return precautions.

## 2014-12-19 NOTE — Progress Notes (Signed)
Pre visit review using our clinic review tool, if applicable. No additional management support is needed unless otherwise documented below in the visit note. 

## 2014-12-19 NOTE — Telephone Encounter (Signed)
Called patient to discuss XRs. Advised that XR had not been read by the radiologist as of yet and we would need to wait for their final read. Advised that there was no obvious fracture on my read of the images, though advised that we would need the radiologist for an official read. She reports her ankle feels improved, though her wrist continues to bother her. She has been icing them and elevating. Advised that we would contact her in the morning once the images were read.

## 2014-12-20 ENCOUNTER — Ambulatory Visit (INDEPENDENT_AMBULATORY_CARE_PROVIDER_SITE_OTHER)
Admission: RE | Admit: 2014-12-20 | Discharge: 2014-12-20 | Disposition: A | Discharge status: Home / Self Care | Payer: Medicare Other | Source: Ambulatory Visit | Attending: Family Medicine | Admitting: Family Medicine

## 2014-12-20 ENCOUNTER — Other Ambulatory Visit: Payer: Medicare Other

## 2014-12-20 DIAGNOSIS — M25532 Pain in left wrist: Secondary | ICD-10-CM

## 2014-12-20 DIAGNOSIS — S6992XA Unspecified injury of left wrist, hand and finger(s), initial encounter: Secondary | ICD-10-CM | POA: Diagnosis not present

## 2014-12-20 DIAGNOSIS — M25571 Pain in right ankle and joints of right foot: Secondary | ICD-10-CM | POA: Diagnosis not present

## 2014-12-20 DIAGNOSIS — M79632 Pain in left forearm: Secondary | ICD-10-CM | POA: Diagnosis not present

## 2014-12-20 DIAGNOSIS — M7989 Other specified soft tissue disorders: Secondary | ICD-10-CM | POA: Diagnosis not present

## 2014-12-20 DIAGNOSIS — S59912A Unspecified injury of left forearm, initial encounter: Secondary | ICD-10-CM | POA: Diagnosis not present

## 2014-12-20 DIAGNOSIS — S99911A Unspecified injury of right ankle, initial encounter: Secondary | ICD-10-CM | POA: Diagnosis not present

## 2014-12-20 DIAGNOSIS — M79642 Pain in left hand: Secondary | ICD-10-CM | POA: Diagnosis not present

## 2014-12-20 NOTE — Telephone Encounter (Signed)
Called patient to inform of possible fracture on radiologists read of wrist XR. Will refer to ortho for further evaluation and management. Will discuss with referral coordinator to see if we can get her in to be seen today.

## 2014-12-23 ENCOUNTER — Ambulatory Visit (INDEPENDENT_AMBULATORY_CARE_PROVIDER_SITE_OTHER): Payer: Medicare Other

## 2014-12-23 DIAGNOSIS — S63502A Unspecified sprain of left wrist, initial encounter: Secondary | ICD-10-CM | POA: Diagnosis not present

## 2014-12-23 DIAGNOSIS — R0989 Other specified symptoms and signs involving the circulatory and respiratory systems: Secondary | ICD-10-CM | POA: Diagnosis not present

## 2014-12-26 ENCOUNTER — Encounter: Payer: Self-pay | Admitting: Family Medicine

## 2014-12-31 DIAGNOSIS — S63502D Unspecified sprain of left wrist, subsequent encounter: Secondary | ICD-10-CM | POA: Diagnosis not present

## 2015-01-02 ENCOUNTER — Ambulatory Visit
Admission: RE | Admit: 2015-01-02 | Discharge: 2015-01-02 | Disposition: A | Discharge status: Home / Self Care | Payer: Medicare Other | Source: Ambulatory Visit

## 2015-01-02 DIAGNOSIS — Z1231 Encounter for screening mammogram for malignant neoplasm of breast: Secondary | ICD-10-CM | POA: Diagnosis not present

## 2015-01-06 ENCOUNTER — Encounter: Payer: Self-pay | Admitting: *Deleted

## 2015-01-06 LAB — HM MAMMOGRAPHY: HM MAMMO: NORMAL

## 2015-05-21 ENCOUNTER — Ambulatory Visit (INDEPENDENT_AMBULATORY_CARE_PROVIDER_SITE_OTHER): Payer: Medicare Other

## 2015-05-21 DIAGNOSIS — Z23 Encounter for immunization: Secondary | ICD-10-CM | POA: Diagnosis not present

## 2015-06-21 ENCOUNTER — Other Ambulatory Visit: Payer: Self-pay | Admitting: Family Medicine

## 2015-06-25 DIAGNOSIS — C4491 Basal cell carcinoma of skin, unspecified: Secondary | ICD-10-CM

## 2015-06-25 HISTORY — DX: Basal cell carcinoma of skin, unspecified: C44.91

## 2015-06-29 ENCOUNTER — Encounter: Payer: Self-pay | Admitting: Family Medicine

## 2015-06-30 MED ORDER — ACETAMINOPHEN 500 MG PO TABS: 500.0000 mg | ORAL_TABLET | Freq: Two times a day (BID) | ORAL | Status: DC

## 2015-07-03 DIAGNOSIS — D225 Melanocytic nevi of trunk: Secondary | ICD-10-CM | POA: Diagnosis not present

## 2015-07-03 DIAGNOSIS — L821 Other seborrheic keratosis: Secondary | ICD-10-CM | POA: Diagnosis not present

## 2015-07-03 DIAGNOSIS — D2271 Melanocytic nevi of right lower limb, including hip: Secondary | ICD-10-CM | POA: Diagnosis not present

## 2015-07-03 DIAGNOSIS — L814 Other melanin hyperpigmentation: Secondary | ICD-10-CM | POA: Diagnosis not present

## 2015-07-03 DIAGNOSIS — C4441 Basal cell carcinoma of skin of scalp and neck: Secondary | ICD-10-CM | POA: Diagnosis not present

## 2015-07-03 DIAGNOSIS — Z85820 Personal history of malignant melanoma of skin: Secondary | ICD-10-CM | POA: Diagnosis not present

## 2015-07-03 DIAGNOSIS — D2272 Melanocytic nevi of left lower limb, including hip: Secondary | ICD-10-CM | POA: Diagnosis not present

## 2015-07-03 DIAGNOSIS — D2262 Melanocytic nevi of left upper limb, including shoulder: Secondary | ICD-10-CM | POA: Diagnosis not present

## 2015-07-03 DIAGNOSIS — D2261 Melanocytic nevi of right upper limb, including shoulder: Secondary | ICD-10-CM | POA: Diagnosis not present

## 2015-07-03 DIAGNOSIS — Z85828 Personal history of other malignant neoplasm of skin: Secondary | ICD-10-CM | POA: Diagnosis not present

## 2015-07-03 DIAGNOSIS — C44519 Basal cell carcinoma of skin of other part of trunk: Secondary | ICD-10-CM | POA: Diagnosis not present

## 2015-07-03 DIAGNOSIS — D1801 Hemangioma of skin and subcutaneous tissue: Secondary | ICD-10-CM | POA: Diagnosis not present

## 2015-07-10 DIAGNOSIS — M25569 Pain in unspecified knee: Secondary | ICD-10-CM | POA: Diagnosis not present

## 2015-07-10 DIAGNOSIS — M4716 Other spondylosis with myelopathy, lumbar region: Secondary | ICD-10-CM | POA: Diagnosis not present

## 2015-07-10 DIAGNOSIS — Q762 Congenital spondylolisthesis: Secondary | ICD-10-CM | POA: Diagnosis not present

## 2015-07-11 ENCOUNTER — Other Ambulatory Visit: Payer: Self-pay | Admitting: Orthopaedic Surgery

## 2015-07-11 DIAGNOSIS — Q762 Congenital spondylolisthesis: Secondary | ICD-10-CM

## 2015-07-20 ENCOUNTER — Encounter: Payer: Self-pay | Admitting: Family Medicine

## 2015-07-21 ENCOUNTER — Ambulatory Visit
Admission: RE | Admit: 2015-07-21 | Discharge: 2015-07-21 | Disposition: A | Discharge status: Home / Self Care | Payer: Medicare Other | Source: Ambulatory Visit | Attending: Orthopaedic Surgery | Admitting: Orthopaedic Surgery

## 2015-07-21 DIAGNOSIS — M4806 Spinal stenosis, lumbar region: Secondary | ICD-10-CM | POA: Diagnosis not present

## 2015-07-21 DIAGNOSIS — Q762 Congenital spondylolisthesis: Secondary | ICD-10-CM

## 2015-07-23 ENCOUNTER — Telehealth: Payer: Self-pay | Admitting: Family Medicine

## 2015-07-23 ENCOUNTER — Encounter (HOSPITAL_COMMUNITY): Payer: Self-pay

## 2015-07-23 ENCOUNTER — Emergency Department (HOSPITAL_COMMUNITY): Payer: Medicare Other

## 2015-07-23 ENCOUNTER — Emergency Department (HOSPITAL_COMMUNITY)
Admission: EM | Admit: 2015-07-23 | Discharge: 2015-07-23 | Disposition: A | Discharge status: Home / Self Care | Payer: Medicare Other | Attending: Emergency Medicine | Admitting: Emergency Medicine

## 2015-07-23 DIAGNOSIS — M8580 Other specified disorders of bone density and structure, unspecified site: Secondary | ICD-10-CM | POA: Insufficient documentation

## 2015-07-23 DIAGNOSIS — M1990 Unspecified osteoarthritis, unspecified site: Secondary | ICD-10-CM | POA: Diagnosis not present

## 2015-07-23 DIAGNOSIS — Z85820 Personal history of malignant melanoma of skin: Secondary | ICD-10-CM | POA: Diagnosis not present

## 2015-07-23 DIAGNOSIS — G529 Cranial nerve disorder, unspecified: Secondary | ICD-10-CM | POA: Diagnosis not present

## 2015-07-23 DIAGNOSIS — E785 Hyperlipidemia, unspecified: Secondary | ICD-10-CM | POA: Diagnosis not present

## 2015-07-23 DIAGNOSIS — Z79899 Other long term (current) drug therapy: Secondary | ICD-10-CM | POA: Insufficient documentation

## 2015-07-23 DIAGNOSIS — I10 Essential (primary) hypertension: Secondary | ICD-10-CM | POA: Insufficient documentation

## 2015-07-23 DIAGNOSIS — Z8719 Personal history of other diseases of the digestive system: Secondary | ICD-10-CM | POA: Insufficient documentation

## 2015-07-23 DIAGNOSIS — M2612 Other jaw asymmetry: Secondary | ICD-10-CM | POA: Diagnosis present

## 2015-07-23 DIAGNOSIS — G510 Bell's palsy: Secondary | ICD-10-CM | POA: Diagnosis not present

## 2015-07-23 DIAGNOSIS — R29818 Other symptoms and signs involving the nervous system: Secondary | ICD-10-CM | POA: Diagnosis not present

## 2015-07-23 LAB — COMPREHENSIVE METABOLIC PANEL
ALT: 21 U/L (ref 14–54)
ANION GAP: 10 (ref 5–15)
AST: 20 U/L (ref 15–41)
Albumin: 4.3 g/dL (ref 3.5–5.0)
Alkaline Phosphatase: 102 U/L (ref 38–126)
BUN: 17 mg/dL (ref 6–20)
CHLORIDE: 110 mmol/L (ref 101–111)
CO2: 23 mmol/L (ref 22–32)
Calcium: 9.5 mg/dL (ref 8.9–10.3)
Creatinine, Ser: 1.02 mg/dL — ABNORMAL HIGH (ref 0.44–1.00)
GFR, EST NON AFRICAN AMERICAN: 52 mL/min — AB (ref >60)
Glucose, Bld: 84 mg/dL (ref 65–99)
POTASSIUM: 4 mmol/L (ref 3.5–5.1)
Sodium: 143 mmol/L (ref 135–145)
Total Bilirubin: 0.4 mg/dL (ref 0.3–1.2)
Total Protein: 7.5 g/dL (ref 6.5–8.1)

## 2015-07-23 LAB — CBC WITH DIFFERENTIAL/PLATELET
BASOS ABS: 0.1 10*3/uL (ref 0.0–0.1)
BASOS PCT: 1 %
Eosinophils Absolute: 0.1 10*3/uL (ref 0.0–0.7)
Eosinophils Relative: 2 %
HEMATOCRIT: 36.8 % (ref 36.0–46.0)
Hemoglobin: 12.6 g/dL (ref 12.0–15.0)
LYMPHS PCT: 43 %
Lymphs Abs: 2.7 10*3/uL (ref 0.7–4.0)
MCH: 31 pg (ref 26.0–34.0)
MCHC: 34.2 g/dL (ref 30.0–36.0)
MCV: 90.4 fL (ref 78.0–100.0)
Monocytes Absolute: 0.5 10*3/uL (ref 0.1–1.0)
Monocytes Relative: 8 %
NEUTROS ABS: 2.9 10*3/uL (ref 1.7–7.7)
Neutrophils Relative %: 46 %
PLATELETS: 299 10*3/uL (ref 150–400)
RBC: 4.07 MIL/uL (ref 3.87–5.11)
RDW: 13 % (ref 11.5–15.5)
WBC: 6.3 10*3/uL (ref 4.0–10.5)

## 2015-07-23 LAB — PROTIME-INR
INR: 1.15 (ref 0.00–1.49)
Prothrombin Time: 14.9 seconds (ref 11.6–15.2)

## 2015-07-23 LAB — TROPONIN I

## 2015-07-23 MED ORDER — METHYLPREDNISOLONE 4 MG PO TBPK: 8.0000 mg | ORAL_TABLET | Freq: Every evening | ORAL | Status: DC

## 2015-07-23 MED ORDER — METHYLPREDNISOLONE 4 MG PO TBPK: 4.0000 mg | ORAL_TABLET | ORAL | Status: DC

## 2015-07-23 MED ORDER — METHYLPREDNISOLONE 4 MG PO TBPK: 4.0000 mg | ORAL_TABLET | Freq: Four times a day (QID) | ORAL | Status: DC

## 2015-07-23 MED ORDER — METHYLPREDNISOLONE 4 MG PO TBPK: 8.0000 mg | ORAL_TABLET | Freq: Every morning | ORAL | Status: DC

## 2015-07-23 MED ORDER — METHYLPREDNISOLONE 4 MG PO TBPK: ORAL_TABLET | ORAL | Status: DC

## 2015-07-23 MED ORDER — METHYLPREDNISOLONE 4 MG PO TBPK: 4.0000 mg | ORAL_TABLET | Freq: Three times a day (TID) | ORAL | Status: DC

## 2015-07-23 MED ORDER — VALACYCLOVIR HCL 1 G PO TABS: 1000.0000 mg | ORAL_TABLET | Freq: Three times a day (TID) | ORAL | Status: AC

## 2015-07-23 MED ORDER — FAMOTIDINE 20 MG PO TABS: 20.0000 mg | ORAL_TABLET | Freq: Two times a day (BID) | ORAL | Status: DC

## 2015-07-23 MED ORDER — VALACYCLOVIR HCL 500 MG PO TABS
1000.0000 mg | ORAL_TABLET | Freq: Once | ORAL | Status: AC
  Administered 2015-07-23: 1000 mg via ORAL
  Filled 2015-07-23: qty 2

## 2015-07-23 MED ORDER — SODIUM CHLORIDE 0.9 % IV SOLN
500.0000 mg | Freq: Once | INTRAVENOUS | Status: AC
  Administered 2015-07-23: 500 mg via INTRAVENOUS
  Filled 2015-07-23: qty 4

## 2015-07-23 MED ORDER — FAMOTIDINE 20 MG PO TABS
20.0000 mg | ORAL_TABLET | Freq: Once | ORAL | Status: AC
  Administered 2015-07-23: 20 mg via ORAL
  Filled 2015-07-23: qty 1

## 2015-07-23 NOTE — ED Provider Notes (Signed)
CSN: [id number]     Arrival date & time 07/23/15  1133 History   First MD Initiated Contact with Patient 07/23/15 1247     Chief Complaint  Patient presents with  . facial assymetry      (Consider location/radiation/quality/duration/timing/severity/associated sxs/prior Treatment) HPI Patient states that at approximately 10 AM she went to practice singing and when she does that she watches herself in the mirror. She noticed that her right lip seemed to be elevating more than her left side. She reports it looked odd and she has never seen that happen before. She does not have any other associated symptoms. She is not sure exactly what time it started because it was something that she noticed only by watching herself sing. She reports that she's had some problems with tingling in her left lower leg. That has been a chronic intermittent problem for several months. She is asked he scheduled to get some back injections today. She does not notice that she had any new symptoms of weakness numbness or tingling in her extremities. She did not have gait instability difficulty with speech, headache or visual anomaly. Patient denies any prior history of stroke. She does however report strokes run in her family. She reports that she's been well recently she has not had any general illness. Past Medical History  Diagnosis Date  . DDD (degenerative disc disease), lumbar 2014    severe facet degeneration L4/5, mod spinal setnosis, mild foraminal stenosis  . Hepatic steatosis 2012    mild by CT  . Colon polyp   . Osteopenia 2011    by DEXA  . Sialadenitis 05/2013    acute [facility])  . HTN (hypertension)   . HLD (hyperlipidemia)   . LPRD (laryngopharyngeal reflux disease)     [facility]eman)  . Insomnia   . Headache   . History of melanoma 2003    face (Dr Luetta Nutting)  . IBS (irritable bowel syndrome)   . Arthritis     R middle finger, chronic arthralgias  . Carotid stenosis, asymptomatic 12/16/2014    Mild bilateral  1-39%, consider rpt [place] 1 yr (11/2014)   . BCC (basal cell carcinoma of skin) 06/2015    supraclavicular, nasal bridge - [name]   Past Surgical History  Procedure Laterality Date  . Colonoscopy  8 30 12     TAx1, diverticulosis, rpt 5 yrs (Out[facility])  . Esophagogastroduodenoscopy  8 30 12     WNL  . Abi  06/2010    WNL  . Dexa  2011    mild osteopenia (T -1.2 R femur)  . Esi  2014    L L4 transforaminal  . Bladder suspension    . Back surgery  1995    lumbar  . Carpal tunnel release Bilateral 2000  . Bilateral oophorectomy  2007    ovarian cysts  . Tonsillectomy  Childhood  . Vaginal hysterectomy      bladder drop  . Mohs surgery  2016    [place]   Family History  Problem Relation Age of Onset  . Cancer Sister     breast  . Pulmonary fibrosis Mother 84  . Diabetes Father   . Hypertension Father   . Stroke Father 67  . Hyperlipidemia Father   . CAD Sister    Social History  Substance Use Topics  . Smoking status: Never Smoker   . Smokeless tobacco: Never Used  . Alcohol Use: No   OB History    No data available  Review of Systems 10 Systems reviewed and are negative for acute change except as noted in the HPI.    Allergies  Naproxen  Home Medications   Prior to Admission medications   Medication Sig Start Date End Date Taking? Authorizing Provider  acetaminophen (TYLENOL) 650 MG CR tablet Take 650 mg by mouth 2 (two) times daily.   Yes Historical Provider, MD  amLODipine (NORVASC) 5 MG tablet Take 1 tablet (5 mg total) by mouth daily. 12/16/14  Yes Ria Bush, MD  atorvastatin (LIPITOR) 20 MG tablet Take 1 tablet (20 mg total) by mouth daily. 08/14/14  Yes Ria Bush, MD  calcium carbonate (OS-CAL) 600 MG TABS tablet Take 1,200 mg by mouth daily with breakfast.   Yes Historical Provider, MD  Cholecalciferol (VITAMIN D) 2000 UNITS CAPS Take 1 capsule by mouth daily.   Yes Historical Provider, MD  diphenhydrAMINE (BENADRYL) 50 MG capsule Take 50 mg by  mouth at bedtime as needed for sleep.   Yes Historical Provider, MD  losartan (COZAAR) 100 MG tablet Take 1 tablet (100 mg total) by mouth daily. 12/16/14  Yes Ria Bush, MD  Multiple Vitamin (MULTIVITAMIN) tablet Take 1 tablet by mouth daily.   Yes Historical Provider, MD  omeprazole (PRILOSEC) 20 MG capsule Take 40 mg by mouth 2 (two) times daily before a meal.  06/23/15  Yes Historical Provider, MD  famotidine (PEPCID) 20 MG tablet Take 1 tablet (20 mg total) by mouth 2 (two) times daily. 07/23/15   Charlesetta Shanks, MD  methylPREDNISolone (MEDROL DOSEPAK) 4 MG TBPK tablet Take Dosepak as per instructions 07/23/15   Charlesetta Shanks, MD  valACYclovir (VALTREX) 1000 MG tablet Take 1 tablet (1,000 mg total) by mouth 3 (three) times daily. 07/23/15 08/06/15  Charlesetta Shanks, MD   BP 140/66 mmHg  Pulse 71  Temp(Src) 98.3 F (36.8 C) (Oral)  Resp 16  SpO2 92% Physical Exam  Constitutional: She is oriented to person, place, and time. She appears well-developed and well-nourished.  HENT:  Head: Normocephalic and atraumatic.  Right Ear: External ear normal.  Left Ear: External ear normal.  Nose: Nose normal.  Mouth/Throat: Oropharynx is clear and moist.  Eyes: EOM are normal. Pupils are equal, round, and reactive to light.  Neck: Neck supple.  Cardiovascular: Normal rate, regular rhythm, normal heart sounds and intact distal pulses.   Pulmonary/Chest: Effort normal and breath sounds normal.  Abdominal: Soft. Bowel sounds are normal. She exhibits no distension. There is no tenderness.  Musculoskeletal: Normal range of motion. She exhibits no edema.  Neurological: She is alert and oriented to person, place, and time. She has normal strength. A cranial nerve deficit is present. She exhibits normal muscle tone. Coordination normal. GCS eye subscore is 4. GCS verbal subscore is 5. GCS motor subscore is 6.  Extraocular motions are normal. The patient has subtle droop to the left mouth. Normal forehead  elevation. No tongue deviation. Upper and lower extremity motor strength is 5 out of 5. Intact heel shin exam bilaterally. No sensory difference to light touch 4 extremities. Speech is clear and cognitively function is normal.  Skin: Skin is warm, dry and intact.  Psychiatric: She has a normal mood and affect.    ED Course  Procedures (including critical care time) Labs Review Labs Reviewed  COMPREHENSIVE METABOLIC PANEL - Abnormal; Notable for the following:    Creatinine, Ser 1.02 (*)    GFR calc non Af Amer 52 (*)    All other components within normal limits  TROPONIN I  CBC WITH DIFFERENTIAL/PLATELET  PROTIME-INR    Imaging Review Mr Brain Wo Contrast (neuro Protocol)  07/23/2015  CLINICAL DATA:  Shaking of the lips. Acute onset. Assess for stroke. EXAM: MRI HEAD WITHOUT CONTRAST TECHNIQUE: Multiplanar, multiecho pulse sequences of the brain and surrounding structures were obtained without intravenous contrast. COMPARISON:  05/17/2013 FINDINGS: Diffusion imaging does not show any acute or subacute infarction. The brainstem and cerebellum are normal. Cerebral hemispheres show a few small foci of T2 and FLAIR signal in the hemispheric white matter, not likely significant. No cortical or large vessel territory infarction. No mass lesion, hemorrhage, hydrocephalus or extra-axial collection. No pituitary mass. No inflammatory sinus disease. No skull or skullbase lesion. No change since the previous study. IMPRESSION: No acute or significant finding. No change since the previous study. Few tiny foci of T2 signal in the hemispheric white matter, not likely significant. Electronically Signed   By: Nelson Chimes M.D.   On: 07/23/2015 14:32   Mr Lumbar Spine Wo Contrast  07/21/2015  CLINICAL DATA:  Tingling and throbbing in the left leg for 1 month. Remote lumbar spine surgery approximately 25 years ago. EXAM: MRI LUMBAR SPINE WITHOUT CONTRAST TECHNIQUE: Multiplanar, multisequence MR imaging of the  lumbar spine was performed. No intravenous contrast was administered. COMPARISON:  08/19/2012 FINDINGS: Grade 2 anterolisthesis of L4 on L5 is unchanged and measures 9 mm. Moderate disc space narrowing at L4-5 is unchanged. Disc space heights are preserved elsewhere. Vertebral body heights are preserved. No significant vertebral marrow edema is seen. The conus medullaris is normal in signal and terminates at T12. Paraspinal soft tissues are unremarkable aside from postoperative changes. T12-L1: Only imaged sagittally. Mild disc bulging without stenosis, unchanged. L1-2:  Mild disc bulging without stenosis, unchanged. L2-3: Mild disc bulging and mild facet and ligamentum flavum hypertrophy without stenosis, unchanged. L3-4: Mild disc bulging and mild-to-moderate facet and ligamentum flavum hypertrophy result in moderate bilateral lateral recess and mild spinal stenosis, slightly progressed from prior. No neural foraminal stenosis. L4-5: Prior left laminectomy again noted. Listhesis, bulging uncovered disc, and advanced facet arthrosis result in moderate spinal stenosis, bilateral lateral recess stenosis, and mild bilateral neural foraminal stenosis, unchanged. L5-S1: Shallow central disc protrusion and moderate facet arthrosis without significant stenosis, unchanged. IMPRESSION: 1. Mild multifactorial spinal stenosis and moderate bilateral lateral recess stenosis at L3-4, slightly progressed from prior. 2. Unchanged grade 2 anterolisthesis of L4 on L5 with moderate spinal and mild bilateral foraminal stenosis. Electronically Signed   By: Logan Bores M.D.   On: 07/21/2015 19:39   I have personally reviewed and evaluated these images and lab results as part of my medical decision-making.   EKG Interpretation None     Consult: (13:30) patient's case reviewed with Dr. Silverio Decamp. We will review MRI results when returned and make plan. MDM   Final diagnoses:  Left-sided Bell's palsy   Dr. Silverio Decamp has  evaluated the patient and per his findings the facial droop is consistent with Bell's palsy. Instructions are to give the patient a 500 mg IV dose of Solu-Medrol in the emergency department. Valtrex 1000 mg 3 times a day for 7 days and Pepcid twice a day. Patient is alert and nontoxic. No cognitive deficit. No other acute illness.    Charlesetta Shanks, MD 07/23/15 [phone]

## 2015-07-23 NOTE — ED Notes (Signed)
IV medication finished, patient discharged.

## 2015-07-23 NOTE — Telephone Encounter (Signed)
Noted. Will await eval.  

## 2015-07-23 NOTE — Telephone Encounter (Signed)
Per chart review tab pt is at [facility] ED now. 

## 2015-07-23 NOTE — ED Notes (Signed)
Patient transported to MRI 

## 2015-07-23 NOTE — ED Notes (Signed)
Sent message to pharmacy to send drugs here, will give meds and D/C patient promptly after. Awaiting meds from pharmacy at this time

## 2015-07-23 NOTE — Discharge Instructions (Signed)
Bell Palsy °Bell palsy is a condition in which the muscles on one side of the face become paralyzed. This often causes one side of the face to droop. It is a common condition and most people recover completely. °RISK FACTORS °Risk factors for Bell palsy include: °· Pregnancy. °· Diabetes. °· An infection by a virus, such as infections that cause cold sores. °CAUSES  °Bell palsy is caused by damage to or inflammation of a nerve in your face. It is unclear why this happens, but an infection by a virus may lead to it. Most of the time the reason it happens is unknown. °SIGNS AND SYMPTOMS  °Symptoms can range from mild to severe and can take place over a number of hours. Symptoms may include: °· Being unable to: °¨ Raise one or both eyebrows. °¨ Close one or both eyes. °¨ Feel parts of your face (facial numbness). °· Drooping of the eyelid and corner of the mouth. °· Weakness in the face. °· Paralysis of half your face. °· Loss of taste. °· Sensitivity to loud noises. °· Difficulty chewing. °· Tearing up of the affected eye. °· Dryness in the affected eye. °· Drooling. °· Pain behind one ear. °DIAGNOSIS  °Diagnosis of Bell palsy may include: °· A medical history and physical exam. °· An MRI. °· A CT scan. °· Electromyography (EMG). This is a test that checks how your nerves are working. °TREATMENT  °Treatment may include antiviral medicine to help shorten the length of the condition. Sometimes treatment is not needed and the symptoms go away on their own. °HOME CARE INSTRUCTIONS  °· Take medicines only as directed by your health care provider. °· Do facial massages and exercises as directed by your health care provider. °· If your eye is affected: °¨ Use moisturizing eye drops to prevent drying of your eye as directed by your health care provider. °¨ Protect your eye as directed by your health care provider. °SEEK MEDICAL CARE IF: °· Your symptoms do not get better or get worse. °· You are drooling. °· Your eye is red,  irritated, or hurts. °SEEK IMMEDIATE MEDICAL CARE IF:  °· Another part of your body feels weak or numb. °· You have difficulty swallowing. °· You have a fever along with symptoms of Bell palsy. °· You develop neck pain. °MAKE SURE YOU:  °· Understand these instructions. °· Will watch your condition. °· Will get help right away if you are not doing well or get worse. °  °This information is not intended to replace advice given to you by your health care provider. Make sure you discuss any questions you have with your health care provider. °  °Document Released: 04/12/2005 Document Revised: 01/01/2015 Document Reviewed: 07/20/2013 °Elsevier Interactive Patient Education ©2016 [employer] ° °

## 2015-07-23 NOTE — ED Notes (Signed)
Pt here with raised rt lip.  Noticed this morning while practicing singing in the mirror.  Pt denies all other symptoms.  Speech is clear.  All grips equal.  Notify MD

## 2015-07-23 NOTE — Telephone Encounter (Signed)
Patient Name: Carrie Lucas  DOB: 1940-10-03    Initial Comment Caller states one side of her mouth is drawn up higher than the other, has tingling in lower left leg   Nurse Assessment  Nurse: Mallie Mussel, RN, Alveta Heimlich Date/Time Eilene Ghazi Time): 07/23/2015 11:03:04 AM  Confirm and document reason for call. If symptomatic, describe symptoms. You must click the next button to save text entered. ---Caller states that the right side of her mouth is higher when smiling. The left side is down too low. The left lower eyelid is not drooping. She has tingling in the left lower leg. The tingling in the left lower leg has been going on for a while. The only symptom new is the difference in her smile and she is slurring her speech some especially with the letter S.  Has the patient traveled out of the country within the last 30 days? ---No  Does the patient have any new or worsening symptoms? ---Yes  Will a triage be completed? ---Yes  Related visit to physician within the last 2 weeks? ---No  Does the PT have any chronic conditions? (i.e. diabetes, asthma, etc.) ---Yes  List chronic conditions. ---HTN, Hypercholesterolemia  Is this a behavioral health or substance abuse call? ---No     Guidelines    Guideline Title Affirmed Question Affirmed Notes  Neurologic Deficit Bell's palsy suspected (i.e., weakness on only one side of the face, developing over hours to days, no other symptoms)    Final Disposition User   Go to ED Now (or PCP triage) Mallie Mussel, RN, [place]    Referrals  [facility] - ED   Disagree/Comply: Comply

## 2015-07-23 NOTE — Consult Note (Signed)
Requesting Physician: Dr. Sharion Dove    Reason for consultation: Left facial weakness  HPI:                                                                                                                                         Carrie Lucas is an 75 y.o. female patient who presented with left facial weakness, onset sometime in early this morning, noted by the patient around 10 AM and she is practicing her singing. She has some singing competition later this weekend. Denies any other neurological symptoms, no new vision speech problems or sensorimotor difficulties in limbs no gait or balance problems. She does have some chronic low back pain with right leg pain symptoms in the left lower extremity.     Past Medical History: Past Medical History  Diagnosis Date  . DDD (degenerative disc disease), lumbar 2014    severe facet degeneration L4/5, mod spinal setnosis, mild foraminal stenosis  . Hepatic steatosis 2012    mild by CT  . Colon polyp   . Osteopenia 2011    by DEXA  . Sialadenitis 05/2013    acute [facility])  . HTN (hypertension)   . HLD (hyperlipidemia)   . LPRD (laryngopharyngeal reflux disease)     [facility])  . Insomnia   . Headache   . History of melanoma 2003    face (Dr Luetta Nutting)  . IBS (irritable bowel syndrome)   . Arthritis     R middle finger, chronic arthralgias  . Carotid stenosis, asymptomatic 12/16/2014    Mild bilateral 1-39%, consider rpt [place] 1 yr (11/2014)   . BCC (basal cell carcinoma of skin) 06/2015    supraclavicular, nasal bridge - [name]    Past Surgical History  Procedure Laterality Date  . Colonoscopy  8 30 12     TAx1, diverticulosis, rpt 5 yrs (Outlaw)  . Esophagogastroduodenoscopy  8 30 12     WNL  . Abi  06/2010    WNL  . Dexa  2011    mild osteopenia (T -1.2 R femur)  . Esi  2014    L L4 transforaminal  . Bladder suspension    . Back surgery  1995    lumbar  . Carpal tunnel release Bilateral 2000  . Bilateral oophorectomy  2007    ovarian  cysts  . Tonsillectomy  Childhood  . Vaginal hysterectomy      bladder drop  . Mohs surgery  2016    [place]    Family History: Family History  Problem Relation Age of Onset  . Cancer Sister     breast  . Pulmonary fibrosis Mother 2  . Diabetes Father   . Hypertension Father   . Stroke Father 1  . Hyperlipidemia Father   . CAD Sister     Social History:   reports that she has never smoked. She has never used smokeless tobacco. She  reports that she does not drink alcohol or use illicit drugs.  Allergies:  Allergies  Allergen Reactions  . Naproxen Itching     Medications:                                                                                                                         Current facility-administered medications:  .  famotidine (PEPCID) tablet 20 mg, 20 mg, Oral, Once, Charlesetta Shanks, MD .  methylPREDNISolone (MEDROL DOSEPAK) tablet 4 mg, 4 mg, Oral, PC lunch, Kaiden Dardis Fuller Mandril, MD .  methylPREDNISolone (MEDROL DOSEPAK) tablet 4 mg, 4 mg, Oral, PC supper, Zakhai Meisinger Fuller Mandril, MD .  Derrill Memo ON 07/24/2015] methylPREDNISolone (MEDROL DOSEPAK) tablet 4 mg, 4 mg, Oral, 3 x daily with food, Dekisha Mesmer Fuller Mandril, MD .  Derrill Memo ON 07/25/2015] methylPREDNISolone (MEDROL DOSEPAK) tablet 4 mg, 4 mg, Oral, 4X daily taper, Khamia Stambaugh Fuller Mandril, MD .  Derrill Memo ON 07/24/2015] methylPREDNISolone (MEDROL DOSEPAK) tablet 8 mg, 8 mg, Oral, AC breakfast, Reilly Molchan Fuller Mandril, MD .  methylPREDNISolone (MEDROL DOSEPAK) tablet 8 mg, 8 mg, Oral, Nightly, Isack Lavalley Fuller Mandril, MD .  Derrill Memo ON 07/24/2015] methylPREDNISolone (MEDROL DOSEPAK) tablet 8 mg, 8 mg, Oral, Nightly, Maronda Caison Fuller Mandril, MD .  methylPREDNISolone sodium succinate (SOLU-MEDROL) 500 mg in sodium chloride 0.9 % 50 mL IVPB, 500 mg, Intravenous, Once, Charlesetta Shanks, MD .  valACYclovir (VALTREX) tablet 1,000 mg, 1,000 mg, Oral, Once, Charlesetta Shanks, MD  Current  outpatient prescriptions:  .  acetaminophen (TYLENOL) 650 MG CR tablet, Take 650 mg by mouth 2 (two) times daily., Disp: , Rfl:  .  amLODipine (NORVASC) 5 MG tablet, Take 1 tablet (5 mg total) by mouth daily., Disp: 90 tablet, Rfl: 3 .  atorvastatin (LIPITOR) 20 MG tablet, Take 1 tablet (20 mg total) by mouth daily., Disp: 90 tablet, Rfl: 1 .  calcium carbonate (OS-CAL) 600 MG TABS tablet, Take 1,200 mg by mouth daily with breakfast., Disp: , Rfl:  .  Cholecalciferol (VITAMIN D) 2000 UNITS CAPS, Take 1 capsule by mouth daily., Disp: , Rfl:  .  diphenhydrAMINE (BENADRYL) 50 MG capsule, Take 50 mg by mouth at bedtime as needed for sleep., Disp: , Rfl:  .  losartan (COZAAR) 100 MG tablet, Take 1 tablet (100 mg total) by mouth daily., Disp: 90 tablet, Rfl: 3 .  Multiple Vitamin (MULTIVITAMIN) tablet, Take 1 tablet by mouth daily., Disp: , Rfl:  .  omeprazole (PRILOSEC) 20 MG capsule, Take 40 mg by mouth 2 (two) times daily before a meal. , Disp: , Rfl:  .  famotidine (PEPCID) 20 MG tablet, Take 1 tablet (20 mg total) by mouth 2 (two) times daily., Disp: 30 tablet, Rfl: 0 .  methylPREDNISolone (MEDROL DOSEPAK) 4 MG TBPK tablet, Take Dosepak as per instructions, Disp: 21 tablet, Rfl: 0 .  valACYclovir (VALTREX) 1000 MG tablet, Take 1 tablet (1,000 mg total) by mouth 3 (three) times daily., Disp: 21 tablet, Rfl: 0  ROS:                                                                                                                                       History obtained from the patient  General ROS: negative for - chills, fatigue, fever, night sweats, weight gain or weight loss Psychological ROS: negative for - behavioral disorder, hallucinations, memory difficulties, mood swings or suicidal ideation Ophthalmic ROS: negative for - blurry vision, double vision, eye pain or loss of vision ENT ROS: negative for - epistaxis, nasal discharge, oral lesions, sore throat, tinnitus or vertigo Allergy and  Immunology ROS: negative for - hives or itchy/watery eyes Hematological and Lymphatic ROS: negative for - bleeding problems, bruising or swollen lymph nodes Endocrine ROS: negative for - galactorrhea, hair pattern changes, polydipsia/polyuria or temperature intolerance Respiratory ROS: negative for - cough, hemoptysis, shortness of breath or wheezing Cardiovascular ROS: negative for - chest pain, dyspnea on exertion, edema or irregular heartbeat Gastrointestinal ROS: negative for - abdominal pain, diarrhea, hematemesis, nausea/vomiting or stool incontinence Genito-Urinary ROS: negative for - dysuria, hematuria, incontinence or urinary frequency/urgency Musculoskeletal ROS: negative for - joint swelling or muscular weakness Neurological ROS: as noted in HPI Dermatological ROS: negative for rash and skin lesion changes  Neurologic Examination:                                                                                                    Today's Vitals   07/23/15 1255 07/23/15 1255 07/23/15 1334 07/23/15 1547  BP:  151/73 160/70 140/66  Pulse:  74 76 71  Temp:      TempSrc:      Resp:  18 16 16   SpO2:  96% 95% 92%  PainSc: 0-No pain       Evaluation of higher integrative functions including: Level of alertness: Alert,  Oriented to time, place and person Recent and remote memory - intact   Attention span and concentration  - intact   Speech: fluent, no evidence of dysarthria or aphasia noted.  Test the following cranial nerves: Left facial weakness with flattening of the left nasolabial fold noted, very mild weakness of the left orbicularis oculi , no significant asymmetry in the forehead noted .otherwise, cranial nerves 2-6, and 8-12 grossly intact Motor examination: Normal tone, bulk, full 5/5 motor strength in all 4 extremities Examination of sensation : Normal and symmetric sensation to pinprick in all 4 extremities and on face Examination of deep tendon reflexes: 2+, normal and  symmetric in  all extremities, normal plantars bilaterally Test coordination: Normal finger nose testing, with no evidence of limb appendicular ataxia or abnormal involuntary movements or tremors noted.  Gait: Deferred       Lab Results: Basic Metabolic Panel:  Recent Labs Lab 07/23/15 1334  NA 143  K 4.0  CL 110  CO2 23  GLUCOSE 84  BUN 17  CREATININE 1.02*  CALCIUM 9.5    Liver Function Tests:  Recent Labs Lab 07/23/15 1334  AST 20  ALT 21  ALKPHOS 102  BILITOT 0.4  PROT 7.5  ALBUMIN 4.3   No results for input(s): LIPASE, AMYLASE in the last 168 hours. No results for input(s): AMMONIA in the last 168 hours.  CBC:  Recent Labs Lab 07/23/15 1334  WBC 6.3  NEUTROABS 2.9  HGB 12.6  HCT 36.8  MCV 90.4  PLT 299    Cardiac Enzymes:  Recent Labs Lab 07/23/15 1334  TROPONINI <0.03    Lipid Panel: No results for input(s): CHOL, TRIG, HDL, CHOLHDL, VLDL, LDLCALC in the last 168 hours.  CBG: No results for input(s): GLUCAP in the last 168 hours.  Microbiology: No results found for this or any previous visit.   Imaging: Mr Brain Wo Contrast (neuro Protocol)  07/23/2015  CLINICAL DATA:  Shaking of the lips. Acute onset. Assess for stroke. EXAM: MRI HEAD WITHOUT CONTRAST TECHNIQUE: Multiplanar, multiecho pulse sequences of the brain and surrounding structures were obtained without intravenous contrast. COMPARISON:  05/17/2013 FINDINGS: Diffusion imaging does not show any acute or subacute infarction. The brainstem and cerebellum are normal. Cerebral hemispheres show a few small foci of T2 and FLAIR signal in the hemispheric white matter, not likely significant. No cortical or large vessel territory infarction. No mass lesion, hemorrhage, hydrocephalus or extra-axial collection. No pituitary mass. No inflammatory sinus disease. No skull or skullbase lesion. No change since the previous study. IMPRESSION: No acute or significant finding. No change since the  previous study. Few tiny foci of T2 signal in the hemispheric white matter, not likely significant. Electronically Signed   By: Nelson Chimes M.D.   On: 07/23/2015 14:32   Mr Lumbar Spine Wo Contrast  07/21/2015  CLINICAL DATA:  Tingling and throbbing in the left leg for 1 month. Remote lumbar spine surgery approximately 25 years ago. EXAM: MRI LUMBAR SPINE WITHOUT CONTRAST TECHNIQUE: Multiplanar, multisequence MR imaging of the lumbar spine was performed. No intravenous contrast was administered. COMPARISON:  08/19/2012 FINDINGS: Grade 2 anterolisthesis of L4 on L5 is unchanged and measures 9 mm. Moderate disc space narrowing at L4-5 is unchanged. Disc space heights are preserved elsewhere. Vertebral body heights are preserved. No significant vertebral marrow edema is seen. The conus medullaris is normal in signal and terminates at T12. Paraspinal soft tissues are unremarkable aside from postoperative changes. T12-L1: Only imaged sagittally. Mild disc bulging without stenosis, unchanged. L1-2:  Mild disc bulging without stenosis, unchanged. L2-3: Mild disc bulging and mild facet and ligamentum flavum hypertrophy without stenosis, unchanged. L3-4: Mild disc bulging and mild-to-moderate facet and ligamentum flavum hypertrophy result in moderate bilateral lateral recess and mild spinal stenosis, slightly progressed from prior. No neural foraminal stenosis. L4-5: Prior left laminectomy again noted. Listhesis, bulging uncovered disc, and advanced facet arthrosis result in moderate spinal stenosis, bilateral lateral recess stenosis, and mild bilateral neural foraminal stenosis, unchanged. L5-S1: Shallow central disc protrusion and moderate facet arthrosis without significant stenosis, unchanged. IMPRESSION: 1. Mild multifactorial spinal stenosis and moderate bilateral lateral recess stenosis at L3-4, slightly progressed  from prior. 2. Unchanged grade 2 anterolisthesis of L4 on L5 with moderate spinal and mild bilateral  foraminal stenosis. Electronically Signed   By: Logan Bores M.D.   On: 07/21/2015 19:39      Assessment and plan:   Carrie Lucas is an 75 y.o. female patient who presented with left facial weakness. Due to mild left orbicularis oculi weakness in addition to the left nasolabial flattening and lower face weakness, this is most likely Bell's palsy, although sparing of the forehead is noted clinically. MRI of the brain done in the ER did not show any acute stroke which is reassuring and again consistent with Bell's palsy.  For treatment, recommend first dose of steroids as IV Solu-Medrol 500 mg in the ER now, followed by Rx for Medrol Dosepak starting tomorrow, and Valtrex 1 g 3 times a day for 7 days.  Recommend Pepcid 20 mg twice a day while on steroids, and advised to patch the left eye at night during sleep to prevent corneal irritation. Follow-up with primary care physician in one to 2 weeks.  Discussed with ER physician, Dr. Vallery Ridge.

## 2015-10-15 ENCOUNTER — Other Ambulatory Visit: Payer: Self-pay | Admitting: *Deleted

## 2015-10-15 MED ORDER — ATORVASTATIN CALCIUM 20 MG PO TABS: 20.0000 mg | ORAL_TABLET | Freq: Every day | ORAL | Status: DC

## 2015-11-13 DIAGNOSIS — N898 Other specified noninflammatory disorders of vagina: Secondary | ICD-10-CM | POA: Diagnosis not present

## 2015-11-13 DIAGNOSIS — Z01419 Encounter for gynecological examination (general) (routine) without abnormal findings: Secondary | ICD-10-CM | POA: Diagnosis not present

## 2015-11-13 DIAGNOSIS — Z130 Encounter for screening for diseases of the blood and blood-forming organs and certain disorders involving the immune mechanism: Secondary | ICD-10-CM | POA: Diagnosis not present

## 2015-11-13 DIAGNOSIS — Z1389 Encounter for screening for other disorder: Secondary | ICD-10-CM | POA: Diagnosis not present

## 2015-11-13 DIAGNOSIS — Z6829 Body mass index (BMI) 29.0-29.9, adult: Secondary | ICD-10-CM | POA: Diagnosis not present

## 2015-11-13 DIAGNOSIS — N9419 Other specified dyspareunia: Secondary | ICD-10-CM | POA: Diagnosis not present

## 2015-11-13 DIAGNOSIS — N760 Acute vaginitis: Secondary | ICD-10-CM | POA: Diagnosis not present

## 2015-11-18 DIAGNOSIS — H11003 Unspecified pterygium of eye, bilateral: Secondary | ICD-10-CM | POA: Diagnosis not present

## 2015-11-18 DIAGNOSIS — H52203 Unspecified astigmatism, bilateral: Secondary | ICD-10-CM | POA: Diagnosis not present

## 2015-11-24 ENCOUNTER — Telehealth: Payer: Self-pay | Admitting: Family Medicine

## 2015-11-24 NOTE — Telephone Encounter (Signed)
LM for pt to sch AWV on 8/24 at 9, prior to CPE at 10 with Dr. Darnell Level, [place]

## 2015-12-01 ENCOUNTER — Other Ambulatory Visit: Payer: Self-pay | Admitting: Family Medicine

## 2015-12-01 DIAGNOSIS — Z1231 Encounter for screening mammogram for malignant neoplasm of breast: Secondary | ICD-10-CM

## 2015-12-10 ENCOUNTER — Other Ambulatory Visit: Payer: Self-pay | Admitting: Family Medicine

## 2015-12-10 DIAGNOSIS — N183 Chronic kidney disease, stage 3 unspecified: Secondary | ICD-10-CM

## 2015-12-10 DIAGNOSIS — E785 Hyperlipidemia, unspecified: Secondary | ICD-10-CM

## 2015-12-10 DIAGNOSIS — K760 Fatty (change of) liver, not elsewhere classified: Secondary | ICD-10-CM

## 2015-12-10 DIAGNOSIS — I10 Essential (primary) hypertension: Secondary | ICD-10-CM

## 2015-12-11 ENCOUNTER — Other Ambulatory Visit (INDEPENDENT_AMBULATORY_CARE_PROVIDER_SITE_OTHER): Payer: Medicare Other

## 2015-12-11 DIAGNOSIS — E785 Hyperlipidemia, unspecified: Secondary | ICD-10-CM

## 2015-12-11 DIAGNOSIS — N183 Chronic kidney disease, stage 3 unspecified: Secondary | ICD-10-CM

## 2015-12-11 DIAGNOSIS — K760 Fatty (change of) liver, not elsewhere classified: Secondary | ICD-10-CM

## 2015-12-11 LAB — COMPREHENSIVE METABOLIC PANEL
ALBUMIN: 4.2 g/dL (ref 3.5–5.2)
ALT: 18 U/L (ref 0–35)
AST: 16 U/L (ref 0–37)
Alkaline Phosphatase: 87 U/L (ref 39–117)
BILIRUBIN TOTAL: 0.4 mg/dL (ref 0.2–1.2)
BUN: 16 mg/dL (ref 6–23)
CALCIUM: 9.9 mg/dL (ref 8.4–10.5)
CO2: 26 mEq/L (ref 19–32)
CREATININE: 1.13 mg/dL (ref 0.40–1.20)
Chloride: 107 mEq/L (ref 96–112)
GFR: 49.83 mL/min — ABNORMAL LOW (ref >60.00)
Glucose, Bld: 98 mg/dL (ref 70–99)
Potassium: 4.3 mEq/L (ref 3.5–5.1)
Sodium: 143 mEq/L (ref 135–145)
Total Protein: 7 g/dL (ref 6.0–8.3)

## 2015-12-11 LAB — LIPID PANEL
Cholesterol: 134 mg/dL (ref 0–200)
HDL: 38.2 mg/dL — AB (ref >39.00)
LDL CALC: 58 mg/dL (ref 0–99)
NonHDL: 95.57
Total CHOL/HDL Ratio: 4
Triglycerides: 186 mg/dL — ABNORMAL HIGH (ref 0.0–149.0)
VLDL: 37.2 mg/dL (ref 0.0–40.0)

## 2015-12-11 LAB — VITAMIN D 25 HYDROXY (VIT D DEFICIENCY, FRACTURES): VITD: 45.58 ng/mL (ref 30.00–100.00)

## 2015-12-18 ENCOUNTER — Ambulatory Visit (INDEPENDENT_AMBULATORY_CARE_PROVIDER_SITE_OTHER): Payer: Medicare Other | Admitting: Family Medicine

## 2015-12-18 ENCOUNTER — Ambulatory Visit (INDEPENDENT_AMBULATORY_CARE_PROVIDER_SITE_OTHER): Payer: Medicare Other

## 2015-12-18 ENCOUNTER — Encounter: Payer: Self-pay | Admitting: Family Medicine

## 2015-12-18 VITALS — BP 124/66 | HR 80 | Temp 98.7°F | Ht 61.5 in | Wt 160.0 lb

## 2015-12-18 DIAGNOSIS — E2839 Other primary ovarian failure: Secondary | ICD-10-CM

## 2015-12-18 DIAGNOSIS — N183 Chronic kidney disease, stage 3 unspecified: Secondary | ICD-10-CM

## 2015-12-18 DIAGNOSIS — J387 Other diseases of larynx: Secondary | ICD-10-CM

## 2015-12-18 DIAGNOSIS — I6522 Occlusion and stenosis of left carotid artery: Secondary | ICD-10-CM | POA: Diagnosis not present

## 2015-12-18 DIAGNOSIS — E785 Hyperlipidemia, unspecified: Secondary | ICD-10-CM | POA: Diagnosis not present

## 2015-12-18 DIAGNOSIS — M8580 Other specified disorders of bone density and structure, unspecified site: Secondary | ICD-10-CM | POA: Diagnosis not present

## 2015-12-18 DIAGNOSIS — I10 Essential (primary) hypertension: Secondary | ICD-10-CM

## 2015-12-18 DIAGNOSIS — Z0000 Encounter for general adult medical examination without abnormal findings: Secondary | ICD-10-CM

## 2015-12-18 DIAGNOSIS — K760 Fatty (change of) liver, not elsewhere classified: Secondary | ICD-10-CM

## 2015-12-18 DIAGNOSIS — Z7189 Other specified counseling: Secondary | ICD-10-CM

## 2015-12-18 DIAGNOSIS — K219 Gastro-esophageal reflux disease without esophagitis: Secondary | ICD-10-CM

## 2015-12-18 MED ORDER — ASPIRIN EC 81 MG PO TBEC: 81.0000 mg | DELAYED_RELEASE_TABLET | ORAL | Status: AC

## 2015-12-18 NOTE — Progress Notes (Signed)
Subjective:   Carrie Lucas is a 75 y.o. female who presents for Medicare Annual (Subsequent) preventive examination.  Review of Systems:  N/A Cardiac Risk Factors include: advanced age (>14men, >7 women);dyslipidemia;hypertension     Objective:     Vitals: BP 124/66 (BP Location: Left Arm, Patient Position: Sitting, Cuff Size: Normal)   Pulse 80   Temp 98.7 F (37.1 C) (Oral)   Ht 5' 1.5" (1.562 m) Comment: no shoes  Wt 160 lb (72.6 kg)   SpO2 92%   BMI 29.74 kg/m   Body mass index is 29.74 kg/m.   Tobacco History  Smoking Status  . Never Smoker  Smokeless Tobacco  . Never Used     Counseling given: No   Past Medical History:  Diagnosis Date  . Arthritis    R middle finger, chronic arthralgias  . BCC (basal cell carcinoma of skin) 06/2015   supraclavicular, nasal bridge - [name]  . Carotid stenosis, asymptomatic 12/16/2014   Mild bilateral 1-39%, consider rpt [place] 1 yr (11/2014)   . Colon polyp   . DDD (degenerative disc disease), lumbar 2014   severe facet degeneration L4/5, mod spinal setnosis, mild foraminal stenosis  . Headache   . Hepatic steatosis 2012   mild by CT  . History of melanoma 2003   face (Dr Luetta Nutting)  . HLD (hyperlipidemia)   . HTN (hypertension)   . IBS (irritable bowel syndrome)   . Insomnia   . LPRD (laryngopharyngeal reflux disease)    [facility])  . Osteopenia 2011   by DEXA  . Sialadenitis 05/2013   acute [facility])   Past Surgical History:  Procedure Laterality Date  . ABI  06/2010   WNL  . BACK SURGERY  1995   lumbar  . BILATERAL OOPHORECTOMY  2007   ovarian cysts  . BLADDER SUSPENSION    . CARPAL TUNNEL RELEASE Bilateral 2000  . COLONOSCOPY  8 30 12    TAx1, diverticulosis, rpt 5 yrs (Outlaw)  . DEXA  2011   mild osteopenia (T -1.2 R femur)  . ESI  2014   L L4 transforaminal  . ESOPHAGOGASTRODUODENOSCOPY  8 30 12    WNL  . MOHS SURGERY  2016   BCC  . TONSILLECTOMY  Childhood  . VAGINAL HYSTERECTOMY     bladder drop    Family History  Problem Relation Age of Onset  . Cancer Sister     breast  . Pulmonary fibrosis Mother 52  . Diabetes Father   . Hypertension Father   . Stroke Father 21  . Hyperlipidemia Father   . CAD Sister    History  Sexual Activity  . Sexual activity: Yes    Outpatient Encounter Prescriptions as of 12/18/2015  Medication Sig  . acetaminophen (TYLENOL) 650 MG CR tablet Take 650 mg by mouth 2 (two) times daily.  Marland Kitchen amLODipine (NORVASC) 5 MG tablet Take 1 tablet (5 mg total) by mouth daily.  Marland Kitchen atorvastatin (LIPITOR) 20 MG tablet Take 1 tablet (20 mg total) by mouth daily.  . calcium carbonate (OS-CAL) 600 MG TABS tablet Take 1,200 mg by mouth daily with breakfast.  . Cholecalciferol (VITAMIN D) 2000 UNITS CAPS Take 1 capsule by mouth daily.  Marland Kitchen losartan (COZAAR) 100 MG tablet Take 1 tablet (100 mg total) by mouth daily.  . Multiple Vitamin (MULTIVITAMIN) tablet Take 1 tablet by mouth daily.  Marland Kitchen omeprazole (PRILOSEC) 20 MG capsule Take 40 mg by mouth 2 (two) times daily before a meal.   . [  DISCONTINUED] diphenhydrAMINE (BENADRYL) 50 MG capsule Take 50 mg by mouth at bedtime as needed for sleep.  . [DISCONTINUED] famotidine (PEPCID) 20 MG tablet Take 1 tablet (20 mg total) by mouth 2 (two) times daily.  . [DISCONTINUED] methylPREDNISolone (MEDROL DOSEPAK) 4 MG TBPK tablet Take Dosepak as per instructions   No facility-administered encounter medications on file as of 12/18/2015.     Activities of Daily Living In your present state of health, do you have any difficulty performing the following activities: 12/18/2015  Hearing? Y  Vision? N  Difficulty concentrating or making decisions? N  Walking or climbing stairs? N  Dressing or bathing? N  Doing errands, shopping? N  Preparing Food and eating ? N  Using the Toilet? N  In the past six months, have you accidently leaked urine? N  Do you have problems with loss of bowel control? N  Managing your Medications? N  Managing your  Finances? N  Housekeeping or managing your Housekeeping? N  Some recent data might be hidden    Patient Care Team: Ria Bush, MD as PCP - General (Family Medicine) Luberta Mutter, MD as Consulting Physician (Ophthalmology) Cheri Fowler, MD as Consulting Physician (Obstetrics and Gynecology) Harriett Sine, MD as Consulting Physician (Dermatology)    Assessment:     Hearing Screening   125Hz  250Hz  500Hz  1000Hz  2000Hz  3000Hz  4000Hz  6000Hz  8000Hz   Right ear:   40 40 40  0    Left ear:   40 40 40  0    Vision Screening Comments: Last vision exam in July 2017 with Dr. Ellie Lunch   Exercise Activities and Dietary recommendations Current Exercise Habits: Home exercise routine, Type of exercise: walking, Time (Minutes): 30, Frequency (Times/Week): 2, Weekly Exercise (Minutes/Week): 60, Exercise limited by: None identified  Goals    . Increase physical activity          Starting 12/18/2015, I will continue to walk at least 30 min twice weekly. In addition, I plan to go to [facility] at least 3 times per week for additional exercise.       Fall Risk Fall Risk  12/18/2015 12/16/2014 12/12/2013  Falls in the past year? Yes No No  Number falls in past yr: 1 - -  Injury with Fall? Yes - -  Risk for fall due to : History of fall(s);Impaired balance/gait - -  Risk for fall due to (comments): pt reports at times her knee "buckles" without warning - -  Follow up Falls evaluation completed - -   Depression Screen PHQ 2/9 Scores 12/18/2015 12/18/2015 12/16/2014 12/12/2013  PHQ - 2 Score 0 0 0 0  PHQ- 9 Score 0 - - -     Cognitive Testing MMSE - Mini Mental State Exam 12/18/2015  Orientation to time 5  Orientation to Place 5  Registration 3  Attention/ Calculation 0  Recall 3  Language- name 2 objects 0  Language- repeat 1  Language- follow 3 step command 3  Language- read & follow direction 0  Write a sentence 0  Copy design 0  Total score 20   PLEASE NOTE: A Mini-Cog screen was  completed. Maximum score is 20. A value of 0 denotes this part of Folstein MMSE was not completed or the patient failed this part of the Mini-Cog screening.   Mini-Cog Screening Orientation to Time - Max 5 pts Orientation to Place - Max 5 pts Registration - Max 3 pts Recall - Max 3 pts Language Repeat - Max 1 pts Language  Follow 3 Step Command - Max 3 pts   Immunization History  Administered Date(s) Administered  . Influenza Whole 02/05/2010  . Influenza,inj,Quad PF,36+ Mos 12/12/2013, 05/21/2015  . Pneumococcal Conjugate-13 12/12/2013  . Pneumococcal Polysaccharide-23 08/24/2005  . Td 02/18/2001  . Tdap 05/10/2013  . Zoster 09/11/2013   Screening Tests Health Maintenance  Topic Date Due  . INFLUENZA VACCINE  04/25/2016 (Originally 11/25/2015)  . COLONOSCOPY  12/23/2020  . DTaP/Tdap/Td (2 - Td) 05/11/2023  . TETANUS/TDAP  05/11/2023  . DEXA SCAN  Completed  . ZOSTAVAX  Completed  . PNA vac Low Risk Adult  Completed      Plan:     I have personally reviewed and addressed the Medicare Annual Wellness questionnaire and have noted the following in the patient's chart:  A. Medical and social history B. Use of alcohol, tobacco or illicit drugs  C. Current medications and supplements D. Functional ability and status E.  Nutritional status F.  Physical activity G. Advance directives H. List of other physicians I.  Hospitalizations, surgeries, and ER visits in previous 12 months J.  [place] to include hearing, vision, cognitive, depression L. Referrals and appointments - none  In addition, I have reviewed and discussed with patient certain preventive protocols, quality metrics, and best practice recommendations. A written personalized care plan for preventive services as well as general preventive health recommendations were provided to patient.  See attached scanned questionnaire for additional information.   Signed,   Lindell Noe, MHA, BS, LPN Health  Advisor

## 2015-12-18 NOTE — Assessment & Plan Note (Addendum)
On scheduled 40mg  omeprazole BID due to breakthrough symptoms.

## 2015-12-18 NOTE — Patient Instructions (Addendum)
Let us know if you don't receive reminder letter to reschedule colonoscopy by next month.  Start wearing knee brace for extra support.  Make sure to stay well hydrated for kidneys, avoid ibuprofen, aleve, advil, motrin.  Start aspirin 81mg  MWF You are doing well today Return as needed or in 1 year for next physical

## 2015-12-18 NOTE — Assessment & Plan Note (Addendum)
Mild by CT 2015, LFTs normal.

## 2015-12-18 NOTE — Progress Notes (Signed)
Pre visit review using our clinic review tool, if applicable. No additional management support is needed unless otherwise documented below in the visit note. 

## 2015-12-18 NOTE — Assessment & Plan Note (Signed)
Chronic, stable. Continue current regimen. 

## 2015-12-18 NOTE — Assessment & Plan Note (Addendum)
Reviewed with patient - encouraged staying well hydrated and avoiding NSAIDs Consider SPEP, TSH, renal US if not already done

## 2015-12-18 NOTE — Assessment & Plan Note (Signed)
Continue calcium and vit D supplements. Update DEXA.

## 2015-12-18 NOTE — Assessment & Plan Note (Signed)
Advanced directives: does not have set up. Packet provided today. Would want Carrie Lucas or daughter to be POA.

## 2015-12-18 NOTE — Progress Notes (Signed)
BP 124/66 (BP Location: Left Arm, Patient Position: Sitting, Cuff Size: Normal)   Pulse 80   Temp 98.7 F (37.1 C) (Oral)   Ht 5' 1.5" (1.562 m) Comment: no shoes  Wt 160 lb (72.6 kg)   SpO2 92%   BMI 29.74 kg/m    CC: f/u visit Subjective:    Patient ID: Carrie Lucas, female    DOB: [birth year], 75 y.o.   MRN: [id number]  HPI: Carrie Lucas is a 75 y.o. female presenting on 12/18/2015 for Annual Exam   Saw Katha Cabal earlier today for medicare wellness visit. Note will be reviewed when completed.   Intermittent R knee buckling and gives out - states no cartilage of R knee. More trouble going down stairs. Does not use brace. No pain. No locking.   Minimal carotid stenosis 11/2014.  Asks about aspirin   Preventative:  COLONOSCOPY Date: 8 30 12  TAx1, diverticulosis, rpt 5 yrs (Outlaw) Well woman with GYN Dr. Orpah Greek, last saw 2015. S/p hysterectomy and oophorectomy. May not return. Mammogram with 3D Tomo WNL 12/2014. +fmhx. Rpt scheduled DEXA - 2011 mild osteopenia (T -1.2 R femur). Will re order Flu - yearly Pneumovax 08/2005. prevnar - 11/2013 Tdap 04/2013  zostavax - 08/2013 Advanced directives: does not have set up. Packet provided again today. Would want Fritz Pickerel or daughter to be POA.  Seat belt use discussed.  Sunscreen use discussed. No changing moles on skin. H/o melanoma, sees derm yearly.  Never smoker No alcohol.   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 Interim medical history since our last visit reviewed. Allergies and medications reviewed and updated. Current Outpatient Prescriptions on File Prior to Visit  Medication Sig  . acetaminophen (TYLENOL) 650 MG CR tablet Take 650 mg  by mouth 2 (two) times daily.  Marland Kitchen amLODipine (NORVASC) 5 MG tablet Take 1 tablet (5 mg total) by mouth daily.  Marland Kitchen atorvastatin (LIPITOR) 20 MG tablet Take 1 tablet (20 mg total) by mouth daily.  . calcium carbonate (OS-CAL) 600 MG TABS tablet Take 1,200 mg by mouth daily with breakfast.  . Cholecalciferol (VITAMIN D) 2000 UNITS CAPS Take 1 capsule by mouth daily.  Marland Kitchen losartan (COZAAR) 100 MG tablet Take 1 tablet (100 mg total) by mouth daily.  . Multiple Vitamin (MULTIVITAMIN) tablet Take 1 tablet by mouth daily.  Marland Kitchen omeprazole (PRILOSEC) 20 MG capsule Take 40 mg by mouth 2 (two) times daily before a meal.    No current facility-administered medications on file prior to visit.     Review of Systems Per HPI unless specifically indicated in ROS section     Objective:    BP 124/66 (BP Location: Left Arm, Patient Position: Sitting, Cuff Size: Normal)   Pulse 80   Temp 98.7 F (37.1 C) (Oral)   Ht 5' 1.5" (1.562 m) Comment: no shoes  Wt 160 lb (72.6 kg)   SpO2 92%   BMI 29.74 kg/m   Wt Readings from Last 3 Encounters:  12/18/15 160 lb (72.6 kg)  12/18/15 160 lb (72.6 kg)  12/19/14 159 lb (72.1 kg)    Physical Exam  Constitutional: She appears well-developed and well-nourished. No distress.  HENT:  Head: Normocephalic and atraumatic.  Mouth/Throat: Oropharynx is clear  and moist. No oropharyngeal exudate.  Eyes: Conjunctivae and EOM are normal. Pupils are equal, round, and reactive to light. No scleral icterus.  Neck: Normal range of motion. Neck supple. Carotid bruit is present (faint L bruit).  Cardiovascular: Normal rate, regular rhythm and intact distal pulses.   Murmur (systolic at USB with radiation to carotids) heard. Pulmonary/Chest: Effort normal and breath sounds normal. No respiratory distress. She has no wheezes. She has no rales.  Musculoskeletal: She exhibits no edema.  Lymphadenopathy:    She has no cervical adenopathy.  Skin: Skin is warm and dry. No rash noted.    Psychiatric: She has a normal mood and affect.  Vitals reviewed.  Results for orders placed or performed in visit on 12/11/15  Lipid panel  Result Value Ref Range   Cholesterol 134 0 - 200 mg/dL   Triglycerides 186.0 (H) 0.0 - 149.0 mg/dL   HDL 38.20 (L) >39.00 mg/dL   VLDL 37.2 0.0 - 40.0 mg/dL   LDL Cholesterol 58 0 - 99 mg/dL   Total CHOL/HDL Ratio 4    NonHDL 95.57   Comprehensive metabolic panel  Result Value Ref Range   Sodium 143 135 - 145 mEq/L   Potassium 4.3 3.5 - 5.1 mEq/L   Chloride 107 96 - 112 mEq/L   CO2 26 19 - 32 mEq/L   Glucose, Bld 98 70 - 99 mg/dL   BUN 16 6 - 23 mg/dL   Creatinine, Ser 1.13 0.40 - 1.20 mg/dL   Total Bilirubin 0.4 0.2 - 1.2 mg/dL   Alkaline Phosphatase 87 39 - 117 U/L   AST 16 0 - 37 U/L   ALT 18 0 - 35 U/L   Total Protein 7.0 6.0 - 8.3 g/dL   Albumin 4.2 3.5 - 5.2 g/dL   Calcium 9.9 8.4 - 10.5 mg/dL   GFR 49.83 (L) >60.00 mL/min  VITAMIN D 25 Hydroxy (Vit-D Deficiency, Fractures)  Result Value Ref Range   VITD 45.58 30.00 - 100.00 ng/mL      Assessment & Plan:   Problem List Items Addressed This Visit    Advanced care planning/counseling discussion    Advanced directives: does not have set up. Packet provided today. Would want Fritz Pickerel or daughter to be POA.       Carotid stenosis, asymptomatic    Mild bilateral 1-39% (2016). Consider rpt next year, continue to monitor yearly.      Relevant Medications   aspirin EC 81 MG tablet (Start on 12/19/2015)   CKD (chronic kidney disease) stage 3, GFR 30-59 ml/min    Reviewed with patient - encouraged staying well hydrated and avoiding NSAIDs Consider SPEP, TSH, renal US if not already done      Hepatic steatosis    Mild by CT 2015, LFTs normal.      HLD (hyperlipidemia) (Chronic)    Chronic, stable. Continue current regimen.       Relevant Medications   aspirin EC 81 MG tablet (Start on 12/19/2015)   HTN (hypertension)    Chronic, stable. Continue current regimen.        Relevant Medications   aspirin EC 81 MG tablet (Start on 12/19/2015)   LPRD (laryngopharyngeal reflux disease)    On scheduled 40mg  omeprazole BID due to breakthrough symptoms.       Osteopenia - Primary    Continue calcium and vit D supplements. Update DEXA.      Relevant Orders   DG Bone Density    Other Visit Diagnoses  Estrogen deficiency       Relevant Orders   DG Bone Density       Follow up plan: Return in about 1 year (around 12/17/2016) for medicare wellness visit.  Ria Bush, MD

## 2015-12-18 NOTE — Assessment & Plan Note (Signed)
Mild bilateral 1-39% (2016). Consider rpt next year, continue to monitor yearly.

## 2015-12-18 NOTE — Progress Notes (Signed)
PCP notes:   Health maintenance:  Flu vaccine - addressed  Abnormal screenings:   Hearing - failed Fall risk - hx of fall with injury  Patient concerns:   Pt reports her right knee "buckles" without warning. Pt states for this reason she is very cautious when walking down stairs.   Nurse concerns:  None  Next PCP appt:   12/18/15 @ 1000

## 2015-12-18 NOTE — Patient Instructions (Addendum)
Carrie Lucas , Thank you for taking time to come for your Medicare Wellness Visit. I appreciate your ongoing commitment to your health goals. Please review the following plan we discussed and let me know if I can assist you in the future.   These are the goals we discussed: Goals    . Increase physical activity          Starting 12/18/2015, I will continue to walk at least 30 min twice weekly. In addition, I plan to go to [facility] at least 3 times per week for additional exercise.        This is a list of the screening recommended for you and due dates:  Health Maintenance  Topic Date Due  . Flu Shot  04/25/2016*  . Colon Cancer Screening  12/23/2020  . DTaP/Tdap/Td vaccine (2 - Td) 05/11/2023  . Tetanus Vaccine  05/11/2023  . DEXA scan (bone density measurement)  Completed  . Shingles Vaccine  Completed  . Pneumonia vaccines  Completed  *Topic was postponed. The date shown is not the original due date.   Preventive Care for Adults  A healthy lifestyle and preventive care can promote health and wellness. Preventive health guidelines for adults include the following key practices.  . A routine yearly physical is a good way to check with your health care provider about your health and preventive screening. It is a chance to share any concerns and updates on your health and to receive a thorough exam.  . Visit your dentist for a routine exam and preventive care every 6 months. Brush your teeth twice a day and floss once a day. Good oral hygiene prevents tooth decay and gum disease.  . The frequency of eye exams is based on your age, health, family medical history, use  of contact lenses, and other factors. Follow your health care provider's ecommendations for frequency of eye exams.  . Eat a healthy diet. Foods like vegetables, fruits, whole grains, low-fat dairy products, and lean protein foods contain the nutrients you need without too many calories. Decrease your intake of foods high in  solid fats, added sugars, and salt. Eat the right amount of calories for you. Get information about a proper diet from your health care provider, if necessary.  . Regular physical exercise is one of the most important things you can do for your health. Most adults should get at least 150 minutes of moderate-intensity exercise (any activity that increases your heart rate and causes you to sweat) each week. In addition, most adults need muscle-strengthening exercises on 2 or more days a week.  Silver Sneakers may be a benefit available to you. To determine eligibility, you may visit the website: www.silversneakers.com or contact program at [phone] Mon-Fri between 8AM-8PM.   . Maintain a healthy weight. The body mass index (BMI) is a screening tool to identify possible weight problems. It provides an estimate of body fat based on height and weight. Your health care provider can find your BMI and can help you achieve or maintain a healthy weight.   For adults 20 years and older: ? A BMI below 18.5 is considered underweight. ? A BMI of 18.5 to 24.9 is normal. ? A BMI of 25 to 29.9 is considered overweight. ? A BMI of 30 and above is considered obese.   . Maintain normal blood lipids and cholesterol levels by exercising and minimizing your intake of saturated fat. Eat a balanced diet with plenty of fruit and vegetables. Blood tests  for lipids and cholesterol should begin at age 58 and be repeated every 5 years. If your lipid or cholesterol levels are high, you are over 50, or you are at high risk for heart disease, you may need your cholesterol levels checked more frequently. Ongoing high lipid and cholesterol levels should be treated with medicines if diet and exercise are not working.  . If you smoke, find out from your health care provider how to quit. If you do not use tobacco, please do not start.  . If you choose to drink alcohol, please do not consume more than 2 drinks per day. One drink  is considered to be 12 ounces (355 mL) of beer, 5 ounces (148 mL) of wine, or 1.5 ounces (44 mL) of liquor.  . If you are 67-67 years old, ask your health care provider if you should take aspirin to prevent strokes.  . Use sunscreen. Apply sunscreen liberally and repeatedly throughout the day. You should seek shade when your shadow is shorter than you. Protect yourself by wearing long sleeves, pants, a wide-brimmed hat, and sunglasses year round, whenever you are outdoors.  . Once a month, do a whole body skin exam, using a mirror to look at the skin on your back. Tell your health care provider of new moles, moles that have irregular borders, moles that are larger than a pencil eraser, or moles that have changed in shape or color.    Marland Kitchen

## 2015-12-20 NOTE — Progress Notes (Signed)
I reviewed health advisor's note, was available for consultation, and agree with documentation and plan.  

## 2015-12-22 ENCOUNTER — Other Ambulatory Visit: Payer: Self-pay | Admitting: Family Medicine

## 2015-12-26 ENCOUNTER — Other Ambulatory Visit: Payer: Self-pay | Admitting: Family Medicine

## 2016-01-06 ENCOUNTER — Ambulatory Visit
Admission: RE | Admit: 2016-01-06 | Discharge: 2016-01-06 | Disposition: A | Discharge status: Home / Self Care | Payer: Medicare Other | Source: Ambulatory Visit | Attending: Family Medicine | Admitting: Family Medicine

## 2016-01-06 DIAGNOSIS — Z1231 Encounter for screening mammogram for malignant neoplasm of breast: Secondary | ICD-10-CM

## 2016-01-07 LAB — HM MAMMOGRAPHY

## 2016-01-08 ENCOUNTER — Encounter: Payer: Self-pay | Admitting: *Deleted

## 2016-01-09 ENCOUNTER — Ambulatory Visit
Admission: RE | Admit: 2016-01-09 | Discharge: 2016-01-09 | Disposition: A | Discharge status: Home / Self Care | Payer: Medicare Other | Source: Ambulatory Visit | Attending: Family Medicine | Admitting: Family Medicine

## 2016-01-09 DIAGNOSIS — M8589 Other specified disorders of bone density and structure, multiple sites: Secondary | ICD-10-CM | POA: Diagnosis not present

## 2016-01-09 DIAGNOSIS — M8580 Other specified disorders of bone density and structure, unspecified site: Secondary | ICD-10-CM

## 2016-01-09 DIAGNOSIS — Z780 Asymptomatic menopausal state: Secondary | ICD-10-CM | POA: Diagnosis not present

## 2016-01-09 DIAGNOSIS — E2839 Other primary ovarian failure: Secondary | ICD-10-CM

## 2016-01-10 ENCOUNTER — Encounter: Payer: Self-pay | Admitting: Family Medicine

## 2016-03-12 ENCOUNTER — Ambulatory Visit (INDEPENDENT_AMBULATORY_CARE_PROVIDER_SITE_OTHER): Payer: Medicare Other

## 2016-03-12 DIAGNOSIS — Z23 Encounter for immunization: Secondary | ICD-10-CM | POA: Diagnosis not present

## 2016-03-20 ENCOUNTER — Encounter (HOSPITAL_COMMUNITY): Payer: Self-pay | Admitting: Emergency Medicine

## 2016-03-20 ENCOUNTER — Ambulatory Visit (HOSPITAL_COMMUNITY)
Admission: EM | Admit: 2016-03-20 | Discharge: 2016-03-20 | Disposition: A | Discharge status: Home / Self Care | Payer: Medicare Other | Attending: Emergency Medicine | Admitting: Emergency Medicine

## 2016-03-20 DIAGNOSIS — K760 Fatty (change of) liver, not elsewhere classified: Secondary | ICD-10-CM | POA: Insufficient documentation

## 2016-03-20 DIAGNOSIS — Z86010 Personal history of colonic polyps: Secondary | ICD-10-CM | POA: Diagnosis not present

## 2016-03-20 DIAGNOSIS — Z803 Family history of malignant neoplasm of breast: Secondary | ICD-10-CM | POA: Insufficient documentation

## 2016-03-20 DIAGNOSIS — Z8249 Family history of ischemic heart disease and other diseases of the circulatory system: Secondary | ICD-10-CM | POA: Diagnosis not present

## 2016-03-20 DIAGNOSIS — I129 Hypertensive chronic kidney disease with stage 1 through stage 4 chronic kidney disease, or unspecified chronic kidney disease: Secondary | ICD-10-CM | POA: Insufficient documentation

## 2016-03-20 DIAGNOSIS — K589 Irritable bowel syndrome without diarrhea: Secondary | ICD-10-CM | POA: Insufficient documentation

## 2016-03-20 DIAGNOSIS — Z87440 Personal history of urinary (tract) infections: Secondary | ICD-10-CM | POA: Insufficient documentation

## 2016-03-20 DIAGNOSIS — Z823 Family history of stroke: Secondary | ICD-10-CM | POA: Diagnosis not present

## 2016-03-20 DIAGNOSIS — Z79899 Other long term (current) drug therapy: Secondary | ICD-10-CM | POA: Insufficient documentation

## 2016-03-20 DIAGNOSIS — N3001 Acute cystitis with hematuria: Secondary | ICD-10-CM | POA: Diagnosis not present

## 2016-03-20 DIAGNOSIS — Z833 Family history of diabetes mellitus: Secondary | ICD-10-CM | POA: Diagnosis not present

## 2016-03-20 DIAGNOSIS — Z888 Allergy status to other drugs, medicaments and biological substances status: Secondary | ICD-10-CM | POA: Insufficient documentation

## 2016-03-20 DIAGNOSIS — Z9889 Other specified postprocedural states: Secondary | ICD-10-CM | POA: Insufficient documentation

## 2016-03-20 DIAGNOSIS — K219 Gastro-esophageal reflux disease without esophagitis: Secondary | ICD-10-CM | POA: Diagnosis not present

## 2016-03-20 DIAGNOSIS — M5136 Other intervertebral disc degeneration, lumbar region: Secondary | ICD-10-CM | POA: Insufficient documentation

## 2016-03-20 DIAGNOSIS — E785 Hyperlipidemia, unspecified: Secondary | ICD-10-CM | POA: Insufficient documentation

## 2016-03-20 DIAGNOSIS — Z7982 Long term (current) use of aspirin: Secondary | ICD-10-CM | POA: Insufficient documentation

## 2016-03-20 DIAGNOSIS — Z85820 Personal history of malignant melanoma of skin: Secondary | ICD-10-CM | POA: Diagnosis not present

## 2016-03-20 DIAGNOSIS — G4700 Insomnia, unspecified: Secondary | ICD-10-CM | POA: Diagnosis not present

## 2016-03-20 LAB — POCT URINALYSIS DIP (DEVICE)
Bilirubin Urine: NEGATIVE
Glucose, UA: NEGATIVE mg/dL
KETONES UR: NEGATIVE mg/dL
Nitrite: NEGATIVE
PH: 7 (ref 5.0–8.0)
PROTEIN: 100 mg/dL — AB
SPECIFIC GRAVITY, URINE: 1.015 (ref 1.005–1.030)
Urobilinogen, UA: 1 mg/dL (ref 0.0–1.0)

## 2016-03-20 MED ORDER — PHENAZOPYRIDINE HCL 200 MG PO TABS: 200.0000 mg | ORAL_TABLET | Freq: Three times a day (TID) | ORAL | 0 refills | Status: DC

## 2016-03-20 MED ORDER — CEPHALEXIN 500 MG PO CAPS: 500.0000 mg | ORAL_CAPSULE | Freq: Four times a day (QID) | ORAL | 0 refills | Status: AC

## 2016-03-20 NOTE — ED Provider Notes (Signed)
[place]    CSN: [id number] Arrival date & time: 03/20/16  1205     History   Chief Complaint Chief Complaint  Patient presents with  . Urinary Tract Infection    HPI Carrie Lucas is a 75 y.o. female.   HPI  She is a 75 year old woman here for evaluation of dysuria. Her symptoms started yesterday with burning and pressure with urination. She also reports urinary frequency. She denies any abdominal pain, flank pain, nausea, vomiting, fevers. Symptoms worsened today. She started noticing a little pink when she wiped. She has a history of urinary tract infections, but none for a long time.  Past Medical History:  Diagnosis Date  . Arthritis    R middle finger, chronic arthralgias  . BCC (basal cell carcinoma of skin) 06/2015   supraclavicular, nasal bridge - [name]  . Carotid stenosis, asymptomatic 12/16/2014   Mild bilateral 1-39%, consider rpt [place] 1 yr (11/2014)   . Colon polyp   . DDD (degenerative disc disease), lumbar 2014   severe facet degeneration L4/5, mod spinal setnosis, mild foraminal stenosis  . Headache   . Hepatic steatosis 2012   mild by CT  . History of melanoma 2003   face (Dr Luetta Nutting)  . HLD (hyperlipidemia)   . HTN (hypertension)   . IBS (irritable bowel syndrome)   . Insomnia   . LPRD (laryngopharyngeal reflux disease)    [facility] Baseman)  . Osteopenia 2011   DEXA T -1.6 hip, -1.3 forearm (12/2015)  . Sialadenitis 05/2013   acute [facility] Baseman)    Patient Active Problem List   Diagnosis Date Noted  . Advanced care planning/counseling discussion 12/16/2014  . Carotid stenosis, asymptomatic 12/16/2014  . Medicare annual wellness visit, subsequent 12/12/2013  . CKD (chronic kidney disease) stage 3, GFR 30-59 ml/min 12/12/2013  . DDD (degenerative disc disease), lumbar   . Hepatic steatosis   . Osteopenia   . HTN (hypertension)   . HLD (hyperlipidemia)   . LPRD (laryngopharyngeal reflux disease)   . Insomnia   . IBS (irritable bowel syndrome)     . Osteoarthritis   . Abdominal bloating 09/09/2013    Past Surgical History:  Procedure Laterality Date  . ABI  06/2010   WNL  . BACK SURGERY  1995   lumbar  . BILATERAL OOPHORECTOMY  2007   ovarian cysts  . BLADDER SUSPENSION    . CARPAL TUNNEL RELEASE Bilateral 2000  . COLONOSCOPY  8 30 12    TAx1, diverticulosis, rpt 5 yrs (Outlaw)  . DEXA  2011   mild osteopenia (T -1.2 R femur)  . ESI  2014   L L4 transforaminal  . ESOPHAGOGASTRODUODENOSCOPY  8 30 12    WNL  . MOHS SURGERY  2016   BCC  . TONSILLECTOMY  Childhood  . VAGINAL HYSTERECTOMY     bladder drop    OB History    No data available       Home Medications    Prior to Admission medications   Medication Sig Start Date End Date Taking? Authorizing Provider  acetaminophen (TYLENOL) 650 MG CR tablet Take 650 mg by mouth 2 (two) times daily.   Yes Historical Provider, MD  amLODipine (NORVASC) 5 MG tablet TAKE ONE TABLET BY MOUTH ONCE DAILY 12/23/15  Yes Ria Bush, MD  aspirin EC 81 MG tablet Take 1 tablet (81 mg total) by mouth every Monday, Wednesday, and Friday. 12/19/15  Yes Ria Bush, MD  atorvastatin (LIPITOR) 20 MG tablet Take 1  tablet (20 mg total) by mouth daily. 10/15/15  Yes Ria Bush, MD  calcium carbonate (OS-CAL) 600 MG TABS tablet Take 1,200 mg by mouth daily with breakfast.   Yes Historical Provider, MD  Cholecalciferol (VITAMIN D) 2000 UNITS CAPS Take 1 capsule by mouth daily.   Yes Historical Provider, MD  losartan (COZAAR) 100 MG tablet TAKE ONE TABLET BY MOUTH ONCE DAILY 12/28/15  Yes Ria Bush, MD  Multiple Vitamin (MULTIVITAMIN) tablet Take 1 tablet by mouth daily.   Yes Historical Provider, MD  omeprazole (PRILOSEC) 20 MG capsule Take 40 mg by mouth 2 (two) times daily before a meal.  06/23/15  Yes Historical Provider, MD  cephALEXin (KEFLEX) 500 MG capsule Take 1 capsule (500 mg total) by mouth 4 (four) times daily. 03/20/16 03/25/16  Melony Overly, MD  phenazopyridine  (PYRIDIUM) 200 MG tablet Take 1 tablet (200 mg total) by mouth 3 (three) times daily. 03/20/16   Melony Overly, MD    Family History Family History  Problem Relation Age of Onset  . Cancer Sister     breast  . Pulmonary fibrosis Mother 72  . Diabetes Father   . Hypertension Father   . Stroke Father 41  . Hyperlipidemia Father   . CAD Sister   . Cancer Sister     breast  . Stroke Sister     Social History Social History  Substance Use Topics  . Smoking status: Never Smoker  . Smokeless tobacco: Never Used  . Alcohol use No     Allergies   Naproxen   Review of Systems Review of Systems As in history of present illness  Physical Exam Triage Vital Signs ED Triage Vitals [03/20/16 1232]  Enc Vitals Group     BP 157/80     Pulse Rate 84     Resp 16     Temp 99 F (37.2 C)     Temp Source Oral     SpO2 100 %     Weight      Height      Head Circumference      Peak Flow      Pain Score      Pain Loc      Pain Edu?      Excl. in [place]?    No data found.   Updated Vital Signs BP 157/80 (BP Location: Left Arm)   Pulse 84   Temp 99 F (37.2 C) (Oral)   Resp 16   SpO2 100%   Visual Acuity Right Eye Distance:   Left Eye Distance:   Bilateral Distance:    Right Eye Near:   Left Eye Near:    Bilateral Near:     Physical Exam  Constitutional: She is oriented to person, place, and time. She appears well-developed and well-nourished. No distress.  Cardiovascular: Normal rate.   Pulmonary/Chest: Effort normal.  Abdominal: Soft. She exhibits no distension. There is no tenderness. There is no guarding.  No CVA tenderness  Neurological: She is alert and oriented to person, place, and time.     UC Treatments / Results  Labs (all labs ordered are listed, but only abnormal results are displayed) Labs Reviewed  POCT URINALYSIS DIP (DEVICE) - Abnormal; Notable for the following:       Result Value   Hgb urine dipstick LARGE (*)    Protein, ur 100 (*)     Leukocytes, UA LARGE (*)    All other components within normal limits  URINE CULTURE    EKG  EKG Interpretation None       Radiology No results found.  Procedures Procedures (including critical care time)  Medications Ordered in UC Medications - No data to display   Initial Impression / Assessment and Plan / UC Course  I have reviewed the triage vital signs and the nursing notes.  Pertinent labs & imaging results that were available during my care of the patient were reviewed by me and considered in my medical decision making (see chart for details).  Clinical Course     History and UA consistent with infection. Urine sent for culture. Treat with Keflex and Pyridium. Follow-up as needed.  Final Clinical Impressions(s) / UC Diagnoses   Final diagnoses:  Acute cystitis with hematuria    New Prescriptions New Prescriptions   CEPHALEXIN (KEFLEX) 500 MG CAPSULE    Take 1 capsule (500 mg total) by mouth 4 (four) times daily.   PHENAZOPYRIDINE (PYRIDIUM) 200 MG TABLET    Take 1 tablet (200 mg total) by mouth 3 (three) times daily.     Melony Overly, MD 03/20/16 [phone]

## 2016-03-20 NOTE — ED Triage Notes (Signed)
C/o UTI sx onset yest associated w/dysuria, urinary freq w/slight pinkish color to tissue when whiping   Denies fevers, chills, abd pain  A&O x4... NAD

## 2016-03-20 NOTE — Discharge Instructions (Signed)
You have a urinary tract infection. Take Keflex 4 times a day for 5 days. Use the Pyridium 3 times a day for the next day or 2 to numb the bladder. This will turn your urine orange. We will call you if we need to change antibiotics. Follow-up as needed.

## 2016-03-22 LAB — URINE CULTURE

## 2016-03-23 ENCOUNTER — Telehealth (HOSPITAL_COMMUNITY): Payer: Self-pay | Admitting: Emergency Medicine

## 2016-03-23 NOTE — Telephone Encounter (Signed)
-----   Message from Melony Overly, MD sent at 03/22/2016 10:00 AM EST ----- The urine culture grew E coli.  This is a common cause of UTI.  It is sensitive to the Keflex prescribed at your visit.  Make sure you complete the entire course.  Follow up as needed. Note copied to mychart. [place]

## 2016-03-23 NOTE — Telephone Encounter (Signed)
LM on [phone] Need to give lab results and to see how pt is doing from recent visit on 11/25 Notified pt in gen message that there is NO need to call back Unless not feeling any better, not tolerating meds well or if wanting to know lab results. Also let pt know labs can be obtained from [facility]

## 2016-03-26 ENCOUNTER — Other Ambulatory Visit: Payer: Self-pay | Admitting: Family Medicine

## 2016-03-26 HISTORY — PX: COLONOSCOPY: SHX174

## 2016-04-06 DIAGNOSIS — K5730 Diverticulosis of large intestine without perforation or abscess without bleeding: Secondary | ICD-10-CM | POA: Diagnosis not present

## 2016-04-06 DIAGNOSIS — K644 Residual hemorrhoidal skin tags: Secondary | ICD-10-CM | POA: Diagnosis not present

## 2016-04-06 DIAGNOSIS — Z86010 Personal history of colonic polyps: Secondary | ICD-10-CM | POA: Diagnosis not present

## 2016-04-06 LAB — HM COLONOSCOPY

## 2016-07-05 DIAGNOSIS — L814 Other melanin hyperpigmentation: Secondary | ICD-10-CM | POA: Diagnosis not present

## 2016-07-05 DIAGNOSIS — Z85828 Personal history of other malignant neoplasm of skin: Secondary | ICD-10-CM | POA: Diagnosis not present

## 2016-07-05 DIAGNOSIS — D2262 Melanocytic nevi of left upper limb, including shoulder: Secondary | ICD-10-CM | POA: Diagnosis not present

## 2016-07-05 DIAGNOSIS — L821 Other seborrheic keratosis: Secondary | ICD-10-CM | POA: Diagnosis not present

## 2016-07-05 DIAGNOSIS — D2272 Melanocytic nevi of left lower limb, including hip: Secondary | ICD-10-CM | POA: Diagnosis not present

## 2016-07-05 DIAGNOSIS — D225 Melanocytic nevi of trunk: Secondary | ICD-10-CM | POA: Diagnosis not present

## 2016-07-05 DIAGNOSIS — D2261 Melanocytic nevi of right upper limb, including shoulder: Secondary | ICD-10-CM | POA: Diagnosis not present

## 2016-07-05 DIAGNOSIS — D1801 Hemangioma of skin and subcutaneous tissue: Secondary | ICD-10-CM | POA: Diagnosis not present

## 2016-08-30 DIAGNOSIS — L820 Inflamed seborrheic keratosis: Secondary | ICD-10-CM | POA: Diagnosis not present

## 2016-08-30 DIAGNOSIS — Z85828 Personal history of other malignant neoplasm of skin: Secondary | ICD-10-CM | POA: Diagnosis not present

## 2016-09-07 ENCOUNTER — Ambulatory Visit (INDEPENDENT_AMBULATORY_CARE_PROVIDER_SITE_OTHER): Payer: Medicare Other | Admitting: Family Medicine

## 2016-09-07 ENCOUNTER — Encounter: Payer: Self-pay | Admitting: Family Medicine

## 2016-09-07 VITALS — BP 144/68 | HR 80 | Temp 98.6°F | Wt 158.2 lb

## 2016-09-07 DIAGNOSIS — H8112 Benign paroxysmal vertigo, left ear: Secondary | ICD-10-CM | POA: Insufficient documentation

## 2016-09-07 MED ORDER — MECLIZINE HCL 25 MG PO TABS
12.5000 mg | ORAL_TABLET | Freq: Two times a day (BID) | ORAL | 0 refills | Status: DC | PRN
Start: 2016-09-07 — End: 2016-09-22

## 2016-09-07 NOTE — Assessment & Plan Note (Addendum)
+   dix hallpike. Anticipate BPPV. Not consistent with central vertigo. Epley canalith repositioning maneuvers done today, handout provided. Update if not improving with treatment to consider vestibular rehab.

## 2016-09-07 NOTE — Patient Instructions (Signed)
BPV positioning maneuvers provided today - may do at home.  May use meclizine as needed for vertigo/nausea. If not improving with treatment, let us know for referral for vestibular rehab.  Benign Positional Vertigo Vertigo is the feeling that you or your surroundings are moving when they are not. Benign positional vertigo is the most common form of vertigo. The cause of this condition is not serious (is benign). This condition is triggered by certain movements and positions (is positional). This condition can be dangerous if it occurs while you are doing something that could endanger you or others, such as driving. What are the causes? In many cases, the cause of this condition is not known. It may be caused by a disturbance in an area of the inner ear that helps your brain to sense movement and balance. This disturbance can be caused by a viral infection (labyrinthitis), head injury, or repetitive motion. What increases the risk? This condition is more likely to develop in:  Women.  People who are 76 years of age or older. What are the signs or symptoms? Symptoms of this condition usually happen when you move your head or your eyes in different directions. Symptoms may start suddenly, and they usually last for less than a minute. Symptoms may include:  Loss of balance and falling.  Feeling like you are spinning or moving.  Feeling like your surroundings are spinning or moving.  Nausea and vomiting.  Blurred vision.  Dizziness.  Involuntary eye movement (nystagmus). Symptoms can be mild and cause only slight annoyance, or they can be severe and interfere with daily life. Episodes of benign positional vertigo may return (recur) over time, and they may be triggered by certain movements. Symptoms may improve over time. How is this diagnosed? This condition is usually diagnosed by medical history and a physical exam of the head, neck, and ears. You may be referred to a health care  provider who specializes in ear, nose, and throat (ENT) problems (otolaryngologist) or a provider who specializes in disorders of the nervous system (neurologist). You may have additional testing, including:  MRI.  A CT scan.  Eye movement tests. Your health care provider may ask you to change positions quickly while he or she watches you for symptoms of benign positional vertigo, such as nystagmus. Eye movement may be tested with an electronystagmogram (ENG), caloric stimulation, the Dix-Hallpike test, or the roll test.  An electroencephalogram (EEG). This records electrical activity in your brain.  Hearing tests. How is this treated? Usually, your health care provider will treat this by moving your head in specific positions to adjust your inner ear back to normal. Surgery may be needed in severe cases, but this is rare. In some cases, benign positional vertigo may resolve on its own in 2-4 weeks. Follow these instructions at home: Safety   Move slowly.Avoid sudden body or head movements.  Avoid driving.  Avoid operating heavy machinery.  Avoid doing any tasks that would be dangerous to you or others if a vertigo episode would occur.  If you have trouble walking or keeping your balance, try using a cane for stability. If you feel dizzy or unstable, sit down right away.  Return to your normal activities as told by your health care provider. Ask your health care provider what activities are safe for you. General instructions   Take over-the-counter and prescription medicines only as told by your health care provider.  Avoid certain positions or movements as told by your health care provider.  Drink enough fluid to keep your urine clear or pale yellow.  Keep all follow-up visits as told by your health care provider. This is important. Contact a health care provider if:  You have a fever.  Your condition gets worse or you develop new symptoms.  Your family or friends notice  any behavioral changes.  Your nausea or vomiting gets worse.  You have numbness or a "pins and needles" sensation. Get help right away if:  You have difficulty speaking or moving.  You are always dizzy.  You faint.  You develop severe headaches.  You have weakness in your legs or arms.  You have changes in your hearing or vision.  You develop a stiff neck.  You develop sensitivity to light. This information is not intended to replace advice given to you by your health care provider. Make sure you discuss any questions you have with your health care provider. Document Released: 01/18/2006 Document Revised: 09/18/2015 Document Reviewed: 08/05/2014 Elsevier Interactive Patient Education  2017 [employer].

## 2016-09-07 NOTE — Progress Notes (Signed)
BP (!) 144/68 (BP Location: Left Arm, Patient Position: Sitting, Cuff Size: Normal)   Pulse 80   Temp 98.6 F (37 C) (Oral)   Wt 158 lb 4 oz (71.8 kg)   SpO2 95%   BMI 29.42 kg/m    CC: dizziness, headache Subjective:    Patient ID: Carrie Lucas, female    DOB: Oct 27, 1940, 76 y.o.   MRN: [id number]  HPI: Carrie Lucas is a 76 y.o. female presenting on 09/07/2016 for Dizziness   1 wk h/o vertigo. Laying supine with left turn - with room spinning sensation. This doesn't happen with turn to the right. + nausea. Some R posterior occipital headache associated as well. Also notices dizziness when getting up to walk to bathroom. Also noticing when she stoops down. + tinnitus.   No presyncope/syncope.  No hearing loss noted.  No fevers/chills, palpitations, cough, other viral symptoms.  No new medicines.   This feels like prior episodes of vertigo, has had vestibular rehab in the past.   Relevant past medical, surgical, family and social history reviewed and updated as indicated. Interim medical history since our last visit reviewed. Allergies and medications reviewed and updated. Outpatient Medications Prior to Visit  Medication Sig Dispense Refill  . acetaminophen (TYLENOL) 650 MG CR tablet Take 650 mg by mouth 2 (two) times daily.    Marland Kitchen amLODipine (NORVASC) 5 MG tablet TAKE ONE TABLET BY MOUTH ONCE DAILY 90 tablet 3  . aspirin EC 81 MG tablet Take 1 tablet (81 mg total) by mouth every Monday, Wednesday, and Friday.    Marland Kitchen atorvastatin (LIPITOR) 20 MG tablet TAKE ONE TABLET BY MOUTH ONCE DAILY 90 tablet 1  . calcium carbonate (OS-CAL) 600 MG TABS tablet Take 1,200 mg by mouth daily with breakfast.    . Cholecalciferol (VITAMIN D) 2000 UNITS CAPS Take 1 capsule by mouth daily.    Marland Kitchen losartan (COZAAR) 100 MG tablet TAKE ONE TABLET BY MOUTH ONCE DAILY 90 tablet 3  . Multiple Vitamin (MULTIVITAMIN) tablet Take 1 tablet by mouth daily.    Marland Kitchen omeprazole (PRILOSEC) 20 MG capsule Take 40 mg by  mouth 2 (two) times daily before a meal.     . phenazopyridine (PYRIDIUM) 200 MG tablet Take 1 tablet (200 mg total) by mouth 3 (three) times daily. 6 tablet 0   No facility-administered medications prior to visit.      Per HPI unless specifically indicated in ROS section below Review of Systems     Objective:    BP (!) 144/68 (BP Location: Left Arm, Patient Position: Sitting, Cuff Size: Normal)   Pulse 80   Temp 98.6 F (37 C) (Oral)   Wt 158 lb 4 oz (71.8 kg)   SpO2 95%   BMI 29.42 kg/m   Wt Readings from Last 3 Encounters:  09/07/16 158 lb 4 oz (71.8 kg)  12/18/15 160 lb (72.6 kg)  12/18/15 160 lb (72.6 kg)    Physical Exam  Constitutional: She is oriented to person, place, and time. She appears well-developed and well-nourished. No distress.  Neck: Normal range of motion. Neck supple. Carotid bruit is present (faint L sided). No thyromegaly present.  Cardiovascular: Normal rate, regular rhythm, normal heart sounds and intact distal pulses.   No murmur heard. Pulmonary/Chest: Effort normal and breath sounds normal. No respiratory distress. She has no wheezes. She has no rales.  Musculoskeletal: She exhibits no edema.  Lymphadenopathy:    She has no cervical adenopathy.  Neurological: She is  alert and oriented to person, place, and time. She has normal strength. No cranial nerve deficit or sensory deficit. She displays a negative Romberg sign.  CN 2-12 intact FTN intact EOMI  dix hallpike + on left epley canalith repositioning maneuvers done today, handout provided  Skin: Skin is warm and dry. No rash noted.  Psychiatric: She has a normal mood and affect.  Nursing note and vitals reviewed.     Assessment & Plan:   Problem List Items Addressed This Visit    BPPV (benign paroxysmal positional vertigo), left - Primary    + dix hallpike. Anticipate BPPV. Not consistent with central vertigo. Epley canalith repositioning maneuvers done today, handout provided. Update if  not improving with treatment to consider vestibular rehab.           Follow up plan: Return if symptoms worsen or fail to improve.  Ria Bush, MD

## 2016-09-10 ENCOUNTER — Telehealth: Payer: Self-pay

## 2016-09-10 DIAGNOSIS — H8112 Benign paroxysmal vertigo, left ear: Secondary | ICD-10-CM

## 2016-09-10 NOTE — Telephone Encounter (Signed)
Pt was seen 09/07/16; pt said she takes the meclizine and then lays down to do the maneuvers; pt has to get right up because she cannot lay flat; if pt is sitting she has no problem; pt request referral for vestibular rehab discussed at appt. Pt will wait for cb from [facility].

## 2016-09-10 NOTE — Telephone Encounter (Signed)
Referral placed.

## 2016-09-13 NOTE — Telephone Encounter (Signed)
Called [facility] to set up Vestibular rehab. You will need to put a Referral in for Physical Therapy and under comments put for Vestibular Therapy, it needs to be a referral not an order. Please change to referral.

## 2016-09-13 NOTE — Addendum Note (Signed)
Addended by: Ria Bush on: 09/13/2016 09:33 AM   Modules accepted: Orders

## 2016-09-14 ENCOUNTER — Encounter: Payer: Self-pay | Admitting: Family Medicine

## 2016-09-15 ENCOUNTER — Ambulatory Visit: Payer: Medicare Other | Attending: Family Medicine

## 2016-09-15 DIAGNOSIS — H8112 Benign paroxysmal vertigo, left ear: Secondary | ICD-10-CM | POA: Diagnosis not present

## 2016-09-15 DIAGNOSIS — R2689 Other abnormalities of gait and mobility: Secondary | ICD-10-CM

## 2016-09-15 NOTE — Therapy (Addendum)
[facility] [address] [place], [place], [address] Phone: [phone]   Fax:  [phone]  Physical Therapy Evaluation  Patient Details  Name: Carrie Lucas MRN: [id number] Date of Birth: 1940-09-13 Referring Provider: Dr. Danise Mina  Encounter Date: 09/15/2016      PT End of Session - 09/15/16 1523    Visit Number 1   Number of Visits 9   Date for PT Re-Evaluation 10/15/16   Authorization Type Medicare: G-CODE AND PROGRESS NOTE EVERY 10TH VISIT   PT Start Time 0933   PT Stop Time 1016   PT Time Calculation (min) 43 min   Activity Tolerance Patient tolerated treatment well   Behavior During Therapy [facility] for tasks assessed/performed      Past Medical History:  Diagnosis Date  . Arthritis    R middle finger, chronic arthralgias  . BCC (basal cell carcinoma of skin) 06/2015   supraclavicular, nasal bridge - [name]  . Carotid stenosis, asymptomatic 12/16/2014   Mild bilateral 1-39%, consider rpt [place] 1 yr (11/2014)   . Colon polyp   . DDD (degenerative disc disease), lumbar 2014   severe facet degeneration L4/5, mod spinal setnosis, mild foraminal stenosis  . Headache   . Hepatic steatosis 2012   mild by CT  . History of melanoma 2003   face (Dr Luetta Nutting)  . HLD (hyperlipidemia)   . HTN (hypertension)   . IBS (irritable bowel syndrome)   . Insomnia   . LPRD (laryngopharyngeal reflux disease)    [facility] Baseman)  . Osteopenia 2011   DEXA T -1.6 hip, -1.3 forearm (12/2015)  . Sialadenitis 05/2013   acute [facility] Baseman)    Past Surgical History:  Procedure Laterality Date  . ABI  06/2010   WNL  . BACK SURGERY  1995   lumbar  . BILATERAL OOPHORECTOMY  2007   ovarian cysts  . BLADDER SUSPENSION    . CARPAL TUNNEL RELEASE Bilateral 2000  . COLONOSCOPY  8 30 12    TAx1, diverticulosis, rpt 5 yrs ([place])  . DEXA  2011   mild osteopenia (T -1.2 R femur)  . ESI  2014   L L4 transforaminal  . ESOPHAGOGASTRODUODENOSCOPY  8  30 12    WNL  . MOHS SURGERY  2016   BCC  . TONSILLECTOMY  Childhood  . VAGINAL HYSTERECTOMY     bladder drop    There were no vitals filed for this visit.       Subjective Assessment - 09/15/16 0941    Subjective Pt reports vertigo began approx. 3 weeks ago, and pt has hx of BPPV. Pt denied injury or trauma prior to onset of dizziness. Pt describes dizziness as spinning sensation, worse when she lies supine and bending forward, and intermittent dizziness when rolling to the L side (when getting up quickly at night to use bathroom). MD told pt to perform Epley maneuver to lessen dizziness. Pt reported HA with onset of dizziness but no longer present. Pt has experienced LOB but caught herself and did not fall.    Patient is accompained by: Family member  Larry-husband   Pertinent History HTN, carotid stenosis, hepatic steatosis, DDD Lx spine, osteopenia, OA, CKD stage III, hx of melanoma, HLD   Patient Stated Goals To get rid of all of this.    Currently in Pain? No/denies            [facility] PT Assessment - 09/15/16 0944      Assessment   Medical Diagnosis  L BPPV   Referring Provider Dr. Danise Mina   Onset Date/Surgical Date 08/25/16   Hand Dominance Right   Prior Therapy none-but MD did provide pt with Epley's treatment      Precautions   Precautions Fall     Restrictions   Weight Bearing Restrictions No     Balance Screen   Has the patient fallen in the past 6 months No   Has the patient had a decrease in activity level because of a fear of falling?  Yes   Is the patient reluctant to leave their home because of a fear of falling?  No     Home Ecologist residence   Living Arrangements Spouse/significant other   Available Help at Discharge Family   Type of [place] to enter   Entrance Stairs-Number of Steps 5   Entrance Stairs-Rails Can reach both   [place] One level   [place] None     Prior Function    Level of [place] Retired   Leisure [place], camping, used to [employer], movies     Cognition   Overall Cognitive Status Within Functional Limits for tasks assessed     Observation/Other Assessments   Focus on Therapeutic Outcomes (FOTO)  DHI: 26%-indicates pt perceives dizziness has mild impact on daily activiites and quality of life.      Sensation   Additional Comments Pt denied N/T or sensation changes.      Transfers   Transfers Sit to Stand;Stand to Sit   Sit to Stand 7: Independent;Without upper extremity assist;From chair/3-in-1   Stand to Sit 7: Independent;Without upper extremity assist;To chair/3-in-1     Ambulation/Gait   Ambulation/Gait Yes   Ambulation/Gait Assistance 7: Independent;5: Supervision   Ambulation/Gait Assistance Details IND while walking in straight trajectory, S while performing head turns 2/2 incr. postural sway.   Ambulation Distance (Feet) 100 Feet   Assistive device None   Gait Pattern Within Functional Limits   Ambulation Surface Level;Indoor   Gait velocity 3.69ft/sec.            Vestibular Assessment - 09/15/16 0950      Vestibular Assessment   General Observation Pt with hx of L BPPV     Symptom Behavior   Type of Dizziness Spinning  with wooziness after spinning sensation and nausea   Frequency of Dizziness Daily   Duration of Dizziness <1 minute   Aggravating Factors Lying supine;Rolling to left  forward bending   Relieving Factors Rest;Slow movements  sitting back upright     Occulomotor Exam   Occulomotor Alignment Abnormal  L hypertropia   Spontaneous Absent   Gaze-induced Absent   Smooth Pursuits Intact   Saccades Intact   Comment Pt denied dizziness. B HIT (-)     Vestibulo-Occular Reflex   VOR 1 Head Only (x 1 viewing) WNL and no c/o dizziness     Positional Testing   Dix-Hallpike Dix-Hallpike Right;Dix-Hallpike Left   Horizontal Canal Testing Horizontal Canal Right;Horizontal Canal Left      Dix-Hallpike Right   Dix-Hallpike Right Duration None   Dix-Hallpike Right Symptoms No nystagmus     Dix-Hallpike Left   Dix-Hallpike Left Duration 10 seconds with nystagmus delayed approx. 15 seconds but pt reported dizziness right away.   Dix-Hallpike Left Symptoms Upbeat, left rotatory nystagmus     Horizontal Canal Right   Horizontal Canal Right Duration none   Horizontal Canal Right  Symptoms Normal     Horizontal Canal Left   Horizontal Canal Left Duration none   Horizontal Canal Left Symptoms Normal     Positional Sensitivities   Supine to Sitting Lightheadedness                Vestibular Treatment/Exercise - 09/15/16 1003      Vestibular Treatment/Exercise   Vestibular Treatment Provided Canalith Repositioning   Canalith Repositioning Epley Manuever Left      EPLEY MANUEVER LEFT   Number of Reps  2   Overall Response  Symptoms Resolved    RESPONSE DETAILS LEFT Pt experienced L upbeating rotary nystagmus for approx. 10 seconds, no delay. Sx's resolved after 2nd treatment.               PT Education - 09/15/16 1523    Education provided Yes   Education Details PT educated pt on exam findings, BPPV, POC, frequency/duration.   Person(s) Educated Patient   Methods Explanation   Comprehension Verbalized understanding          PT Short Term Goals - 09/15/16 1532      PT SHORT TERM GOAL #1   Title same as LTGs.           PT Long Term Goals - 09/15/16 1532      PT LONG TERM GOAL #1   Title Pt will report 0/10 dizziness during all positional testing to improve safety during ADLs and improve quality of life. TARGET DATE FOR ALL LTGS: 10/13/16   Status New     PT LONG TERM GOAL #2   Title Perform FGA and write goal as indicated.    Status New     PT LONG TERM GOAL #3   Title Pt will improve DHI score to >/=8% to improve quality of life and reduce impact of dizziness on daily activities.    Status New     PT LONG TERM GOAL #4   Title  Pt will amb. 1000' while performing head turns, IND, without dizziness and no LOB to improve safety during functional mobility.    Status New     PT LONG TERM GOAL #5   Title Provide HEP as necessary to reduce dizziness and improve balance.   Status New               Plan - 09/15/16 1526    Clinical Impression Statement Pt is a pleasant 76y/o female presenting to [facility] neuro with vertigo. Pt has the following significant PMH: HTN, carotid stenosis, hepatic steatosis, DDD Lx spine, osteopenia, OA, CKD stage III, hx of melanoma, HLD. Pt's occulomotor exam was WNL, except for L eye hypertropia. During positional testing pt experienced 3/10 dizziness and L rotary upbeating nystagmus, indicating L pBPPV. Sx's resolved after 2 Epley treatments. The following deficits were noted during exam: gait deviations with head turns, dizziness and impaired balance during dynamic gait activities. PT will formally asssess balance next session, and write goal as indicated. Pt would benefit from skilled PT to improve safety during functional mobility.    Rehab Potential Excellent   Clinical Impairments Affecting Rehab Potential hx of vertigo   PT Frequency 1x / week   PT Duration 4 weeks   PT Treatment/Interventions ADLs/Self Care Home Management;Biofeedback;Canalith Repostioning;Neuromuscular re-education;Balance training;Therapeutic exercise;Therapeutic activities;Manual techniques;Functional mobility training;Gait training;Stair training;Orthotic Fit/Training;DME Instruction;Patient/family education;Vestibular   PT Next Visit Plan Assess for BPPV (L pBPPV) and treat as indicated, perform FGA and write goal as indicated.    Consulted and  Agree with Plan of Care Patient      Patient will benefit from skilled therapeutic intervention in order to improve the following deficits and impairments:  Abnormal gait, Decreased balance, Dizziness  Visit Diagnosis: BPPV (benign paroxysmal positional vertigo), left -  Plan: PT plan of care cert/re-cert  Other abnormalities of gait and mobility - Plan: PT plan of care cert/re-cert      G-Codes - 88/89/16 1538    Functional Assessment Tool Used (Outpatient Only) DHI: 26%   Functional Limitation Self care   Self Care Current Status (X4503) At least 20 percent but less than 40 percent impaired, limited or restricted   Self Care Goal Status (U8828) At least 1 percent but less than 20 percent impaired, limited or restricted       Problem List Patient Active Problem List   Diagnosis Date Noted  . BPPV (benign paroxysmal positional vertigo), left 09/07/2016  . Advanced care planning/counseling discussion 12/16/2014  . Carotid stenosis, asymptomatic 12/16/2014  . Medicare annual wellness visit, subsequent 12/12/2013  . CKD (chronic kidney disease) stage 3, GFR 30-59 ml/min 12/12/2013  . DDD (degenerative disc disease), lumbar   . Hepatic steatosis   . Osteopenia   . HTN (hypertension)   . HLD (hyperlipidemia)   . LPRD (laryngopharyngeal reflux disease)   . Insomnia   . IBS (irritable bowel syndrome)   . Osteoarthritis   . Abdominal bloating 09/09/2013    Azarius Lambson L 09/15/2016, 3:39 PM  [facility] [address] [place], [place], [address] Phone: [phone]   Fax:  [phone]  Name: CATHLEEN YAGI MRN: [id number] Date of Birth: Sep 21, 1940  Geoffry Paradise, PT,DPT 09/15/16 3:39 PM Phone: [phone] Fax: [phone]

## 2016-09-22 ENCOUNTER — Encounter: Payer: Self-pay | Admitting: Physical Therapy

## 2016-09-22 ENCOUNTER — Ambulatory Visit: Payer: Medicare Other | Admitting: Physical Therapy

## 2016-09-22 ENCOUNTER — Encounter (HOSPITAL_COMMUNITY): Payer: Self-pay | Admitting: Emergency Medicine

## 2016-09-22 ENCOUNTER — Ambulatory Visit (HOSPITAL_COMMUNITY)
Admission: EM | Admit: 2016-09-22 | Discharge: 2016-09-22 | Disposition: A | Discharge status: Home / Self Care | Payer: Medicare Other | Attending: Family Medicine | Admitting: Family Medicine

## 2016-09-22 DIAGNOSIS — I10 Essential (primary) hypertension: Secondary | ICD-10-CM | POA: Insufficient documentation

## 2016-09-22 DIAGNOSIS — K589 Irritable bowel syndrome without diarrhea: Secondary | ICD-10-CM | POA: Diagnosis not present

## 2016-09-22 DIAGNOSIS — N390 Urinary tract infection, site not specified: Secondary | ICD-10-CM | POA: Diagnosis not present

## 2016-09-22 DIAGNOSIS — Z833 Family history of diabetes mellitus: Secondary | ICD-10-CM | POA: Diagnosis not present

## 2016-09-22 DIAGNOSIS — Z8249 Family history of ischemic heart disease and other diseases of the circulatory system: Secondary | ICD-10-CM | POA: Insufficient documentation

## 2016-09-22 DIAGNOSIS — E785 Hyperlipidemia, unspecified: Secondary | ICD-10-CM | POA: Insufficient documentation

## 2016-09-22 DIAGNOSIS — H8112 Benign paroxysmal vertigo, left ear: Secondary | ICD-10-CM

## 2016-09-22 DIAGNOSIS — R300 Dysuria: Secondary | ICD-10-CM

## 2016-09-22 DIAGNOSIS — R2689 Other abnormalities of gait and mobility: Secondary | ICD-10-CM

## 2016-09-22 DIAGNOSIS — G4700 Insomnia, unspecified: Secondary | ICD-10-CM | POA: Diagnosis not present

## 2016-09-22 DIAGNOSIS — Z85820 Personal history of malignant melanoma of skin: Secondary | ICD-10-CM | POA: Insufficient documentation

## 2016-09-22 DIAGNOSIS — Z803 Family history of malignant neoplasm of breast: Secondary | ICD-10-CM | POA: Diagnosis not present

## 2016-09-22 DIAGNOSIS — R350 Frequency of micturition: Secondary | ICD-10-CM | POA: Diagnosis present

## 2016-09-22 DIAGNOSIS — Z823 Family history of stroke: Secondary | ICD-10-CM | POA: Diagnosis not present

## 2016-09-22 MED ORDER — PHENAZOPYRIDINE HCL 200 MG PO TABS
200.0000 mg | ORAL_TABLET | Freq: Three times a day (TID) | ORAL | 0 refills | Status: DC
Start: 2016-09-22 — End: 2016-12-23

## 2016-09-22 MED ORDER — NITROFURANTOIN MONOHYD MACRO 100 MG PO CAPS: 100.0000 mg | ORAL_CAPSULE | Freq: Two times a day (BID) | ORAL | 0 refills | Status: DC

## 2016-09-22 NOTE — Patient Instructions (Signed)
Feet Partial Heel-Toe, Head Motion - Eyes Open    With eyes open, right foot partially in front of the other, move head slowly: up and down 10 times, side to side 10 times. Place L foot forwards and repeat head turns up/down 10 times, left to right 10 times. Do 2 sessions per day.

## 2016-09-22 NOTE — ED Notes (Signed)
Patient will bring UA sample tomorrow, supplies given

## 2016-09-22 NOTE — ED Triage Notes (Signed)
The patient presented to the [facility] with a complaint of urinary frequency/pressure and dysuria that started today.

## 2016-09-22 NOTE — Therapy (Signed)
[facility] [address] [place], [place], [address] Phone: [phone]   Fax:  [phone]  Physical Therapy Treatment  Patient Details  Name: Carrie Lucas MRN: [id number] Date of Birth: 06-Sep-1940 Referring Provider: Dr. Danise Mina  Encounter Date: 09/22/2016      PT End of Session - 09/22/16 0936    Visit Number 2   Number of Visits 9   Date for PT Re-Evaluation 10/15/16   Authorization Type Medicare: G-CODE AND PROGRESS NOTE EVERY 10TH VISIT   PT Start Time 0845   PT Stop Time 0933   PT Time Calculation (min) 48 min   Activity Tolerance Patient tolerated treatment well   Behavior During Therapy [facility] for tasks assessed/performed      Past Medical History:  Diagnosis Date  . Arthritis    R middle finger, chronic arthralgias  . BCC (basal cell carcinoma of skin) 06/2015   supraclavicular, nasal bridge - [name]  . Carotid stenosis, asymptomatic 12/16/2014   Mild bilateral 1-39%, consider rpt [place] 1 yr (11/2014)   . Colon polyp   . DDD (degenerative disc disease), lumbar 2014   severe facet degeneration L4/5, mod spinal setnosis, mild foraminal stenosis  . Headache   . Hepatic steatosis 2012   mild by CT  . History of melanoma 2003   face (Dr Luetta Nutting)  . HLD (hyperlipidemia)   . HTN (hypertension)   . IBS (irritable bowel syndrome)   . Insomnia   . LPRD (laryngopharyngeal reflux disease)    [facility] Baseman)  . Osteopenia 2011   DEXA T -1.6 hip, -1.3 forearm (12/2015)  . Sialadenitis 05/2013   acute [facility] Baseman)    Past Surgical History:  Procedure Laterality Date  . ABI  06/2010   WNL  . BACK SURGERY  1995   lumbar  . BILATERAL OOPHORECTOMY  2007   ovarian cysts  . BLADDER SUSPENSION    . CARPAL TUNNEL RELEASE Bilateral 2000  . COLONOSCOPY  8 30 12    TAx1, diverticulosis, rpt 5 yrs (Outlaw)  . DEXA  2011   mild osteopenia (T -1.2 R femur)  . ESI  2014   L L4 transforaminal  . ESOPHAGOGASTRODUODENOSCOPY  8 30  12    WNL  . MOHS SURGERY  2016   BCC  . TONSILLECTOMY  Childhood  . VAGINAL HYSTERECTOMY     bladder drop    There were no vitals filed for this visit.      Subjective Assessment - 09/22/16 0850    Subjective Pt reports after last session she felt a little sick but then felt better as the day went on.  Still having intermittent vertigo of short duration during sit> supine and bending down but it isn't every time.     Patient is accompained by: Family member   Pertinent History HTN, carotid stenosis, hepatic steatosis, DDD Lx spine, osteopenia, OA, CKD stage III, hx of melanoma, HLD   Patient Stated Goals To get rid of all of this.    Currently in Pain? No/denies            [facility] PT Assessment - 09/22/16 0908      Functional Gait  Assessment   Gait assessed  Yes   Gait Level Surface Walks 20 ft in less than 7 sec but greater than 5.5 sec, uses assistive device, slower speed, mild gait deviations, or deviates 6-10 in outside of the 12 in walkway width.   Change in Gait Speed Able to smoothly change  walking speed without loss of balance or gait deviation. Deviate no more than 6 in outside of the 12 in walkway width.   Gait with Horizontal Head Turns Performs head turns smoothly with slight change in gait velocity (eg, minor disruption to smooth gait path), deviates 6-10 in outside 12 in walkway width, or uses an assistive device.   Gait with Vertical Head Turns Performs task with moderate change in gait velocity, slows down, deviates 10-15 in outside 12 in walkway width but recovers, can continue to walk.   Gait and Pivot Turn Pivot turns safely within 3 sec and stops quickly with no loss of balance.   Step Over Obstacle Is able to step over 2 stacked shoe boxes taped together (9 in total height) without changing gait speed. No evidence of imbalance.   Gait with Narrow Base of Support Ambulates less than 4 steps heel to toe or cannot perform without assistance.   Gait with Eyes  Closed Walks 20 ft, uses assistive device, slower speed, mild gait deviations, deviates 6-10 in outside 12 in walkway width. Ambulates 20 ft in less than 9 sec but greater than 7 sec.   Ambulating Backwards Walks 20 ft, uses assistive device, slower speed, mild gait deviations, deviates 6-10 in outside 12 in walkway width.   Steps Alternating feet, must use rail.  has to use rails due to knee pain/instability   Total Score 20   FGA comment: 20/30 high falls risk            Vestibular Assessment - 09/22/16 0851      Positional Testing   Dix-Hallpike Dix-Hallpike Right;Dix-Hallpike Left   Horizontal Canal Testing Horizontal Canal Right;Horizontal Canal Left     Dix-Hallpike Right   Dix-Hallpike Right Duration 0   Dix-Hallpike Right Symptoms No nystagmus     Dix-Hallpike Left   Dix-Hallpike Left Duration 0 on first assessment; second assessment with slightly more extension nystagmus present x 10 seconds   Dix-Hallpike Left Symptoms Upbeat, left rotatory nystagmus  none on first assessment     Horizontal Canal Right   Horizontal Canal Right Duration 0   Horizontal Canal Right Symptoms Normal     Horizontal Canal Left   Horizontal Canal Left Duration 0   Horizontal Canal Left Symptoms Normal                  Vestibular Treatment/Exercise - 09/22/16 0904      Vestibular Treatment/Exercise   Vestibular Treatment Provided Canalith Repositioning   Canalith Repositioning Epley Manuever Left      EPLEY MANUEVER LEFT   Number of Reps  1   Overall Response  Symptoms Resolved    RESPONSE DETAILS LEFT Nausea and clammy after first treatment; pt requested second treatment but no nystagmus present and no vertigo            Balance Exercises - 09/22/16 0924      Balance Exercises: Standing   Standing Eyes Opened Narrow base of support (BOS);Head turns;Solid surface;Other reps (comment)  feet together and then partial tandem, 10 reps head turns           PT  Education - 09/22/16 0935    Education provided Yes   Education Details BPPV, FGA score and falls risk, recommendations for safety at home, corner balance exercises   Person(s) Educated Patient   Methods Explanation;Demonstration;Handout   Comprehension Verbalized understanding;Returned demonstration          PT Short Term Goals - 09/15/16 1532  PT SHORT TERM GOAL #1   Title same as LTGs.           PT Long Term Goals - 09/22/16 0934      PT LONG TERM GOAL #1   Title Pt will report 0/10 dizziness during all positional testing to improve safety during ADLs and improve quality of life. TARGET DATE FOR ALL LTGS: 10/13/16   Status New     PT LONG TERM GOAL #2   Title Pt will decrease falls risk during ambulation as indicated by increase in FGA score of > or = 23/30   Baseline 20/30   Status Revised     PT LONG TERM GOAL #3   Title Pt will improve DHI score to >/=8% to improve quality of life and reduce impact of dizziness on daily activities.    Status New     PT LONG TERM GOAL #4   Title Pt will amb. 1000' while performing head turns, IND, without dizziness and no LOB to improve safety during functional mobility.    Status New     PT LONG TERM GOAL #5   Title Provide HEP as necessary to reduce dizziness and improve balance.   Status New               Plan - 09/22/16 4010    Clinical Impression Statement Pt returns with continued reports of vertigo during positional changes.  Pt continues to present with signs and symptoms consistent with L posterior canal canalithiasis and disequilibrium.  Pt treated with CRM x 1 with resolutions of vertigo but persistent disequilibrium following treatment.  Pt balance and falls risk assessed with FGA with pt noted to be at increased risk for falls with a score of 20/30.  Discussed activities that may place pt at increased risk for LOB and falls and discussed recommendations to improve safety.  Introduced standing balance training  in corner with narrow BOS and head movements.  Will continue to progress.   Rehab Potential Excellent   Clinical Impairments Affecting Rehab Potential hx of vertigo   PT Frequency 1x / week   PT Duration 4 weeks   PT Treatment/Interventions ADLs/Self Care Home Management;Biofeedback;Canalith Repostioning;Neuromuscular re-education;Balance training;Therapeutic exercise;Therapeutic activities;Manual techniques;Functional mobility training;Gait training;Stair training;Orthotic Fit/Training;DME Instruction;Patient/family education;Vestibular   PT Next Visit Plan Assess for BPPV (L pBPPV) and treat as indicated, practice Epley self treatment; progress corner balance exercises, habituation to head movements up/down and bending down   Consulted and Agree with Plan of Care Patient      Patient will benefit from skilled therapeutic intervention in order to improve the following deficits and impairments:  Abnormal gait, Decreased balance, Dizziness  Visit Diagnosis: BPPV (benign paroxysmal positional vertigo), left  Other abnormalities of gait and mobility     Problem List Patient Active Problem List   Diagnosis Date Noted  . BPPV (benign paroxysmal positional vertigo), left 09/07/2016  . Advanced care planning/counseling discussion 12/16/2014  . Carotid stenosis, asymptomatic 12/16/2014  . Medicare annual wellness visit, subsequent 12/12/2013  . CKD (chronic kidney disease) stage 3, GFR 30-59 ml/min 12/12/2013  . DDD (degenerative disc disease), lumbar   . Hepatic steatosis   . Osteopenia   . HTN (hypertension)   . HLD (hyperlipidemia)   . LPRD (laryngopharyngeal reflux disease)   . Insomnia   . IBS (irritable bowel syndrome)   . Osteoarthritis   . Abdominal bloating 09/09/2013    Raylene Everts, PT, DPT 09/22/16    9:42 AM    [facility] [address] [place], [place], [address] Phone: [phone]   Fax:   [phone]  Name: VERNESHA TALBOT MRN: [id number] Date of Birth: 1940-05-01

## 2016-09-23 ENCOUNTER — Telehealth (HOSPITAL_COMMUNITY): Payer: Self-pay | Admitting: Family Medicine

## 2016-09-23 ENCOUNTER — Ambulatory Visit (HOSPITAL_COMMUNITY)
Admission: EM | Admit: 2016-09-23 | Discharge: 2016-09-23 | Disposition: A | Discharge status: Home / Self Care | Payer: Medicare Other | Attending: Family Medicine | Admitting: Family Medicine

## 2016-09-23 DIAGNOSIS — R300 Dysuria: Secondary | ICD-10-CM | POA: Diagnosis not present

## 2016-09-23 LAB — POCT URINALYSIS DIP (DEVICE)
BILIRUBIN URINE: NEGATIVE
Glucose, UA: NEGATIVE mg/dL
HGB URINE DIPSTICK: NEGATIVE
KETONES UR: NEGATIVE mg/dL
Nitrite: POSITIVE — AB
PH: 6 (ref 5.0–8.0)
PROTEIN: NEGATIVE mg/dL
Specific Gravity, Urine: 1.015 (ref 1.005–1.030)
Urobilinogen, UA: 0.2 mg/dL (ref 0.0–1.0)

## 2016-09-23 NOTE — ED Notes (Signed)
Chart opened for labs only

## 2016-09-23 NOTE — ED Provider Notes (Signed)
CSN: [id number]     Arrival date & time 09/22/16  1924 History   None    Chief Complaint  Patient presents with  . Urinary Frequency  . Dysuria   (Consider location/radiation/quality/duration/timing/severity/associated sxs/prior Treatment) Patient c/o urinary frequency and dysuria.   The history is provided by the patient.  Urinary Frequency  This is a new problem. The current episode started 12 to 24 hours ago. The problem occurs constantly. The problem has not changed since onset.Nothing aggravates the symptoms. Nothing relieves the symptoms. She has tried nothing for the symptoms.  Dysuria    Past Medical History:  Diagnosis Date  . Arthritis    R middle finger, chronic arthralgias  . BCC (basal cell carcinoma of skin) 06/2015   supraclavicular, nasal bridge - [name]  . Carotid stenosis, asymptomatic 12/16/2014   Mild bilateral 1-39%, consider rpt [place] 1 yr (11/2014)   . Colon polyp   . DDD (degenerative disc disease), lumbar 2014   severe facet degeneration L4/5, mod spinal setnosis, mild foraminal stenosis  . Headache   . Hepatic steatosis 2012   mild by CT  . History of melanoma 2003   face (Dr Luetta Nutting)  . HLD (hyperlipidemia)   . HTN (hypertension)   . IBS (irritable bowel syndrome)   . Insomnia   . LPRD (laryngopharyngeal reflux disease)    [facility] Baseman)  . Osteopenia 2011   DEXA T -1.6 hip, -1.3 forearm (12/2015)  . Sialadenitis 05/2013   acute [facility])   Past Surgical History:  Procedure Laterality Date  . ABI  06/2010   WNL  . BACK SURGERY  1995   lumbar  . BILATERAL OOPHORECTOMY  2007   ovarian cysts  . BLADDER SUSPENSION    . CARPAL TUNNEL RELEASE Bilateral 2000  . COLONOSCOPY  8 30 12    TAx1, diverticulosis, rpt 5 yrs (Outlaw)  . DEXA  2011   mild osteopenia (T -1.2 R femur)  . ESI  2014   L L4 transforaminal  . ESOPHAGOGASTRODUODENOSCOPY  8 30 12    WNL  . MOHS SURGERY  2016   BCC  . TONSILLECTOMY  Childhood  . VAGINAL HYSTERECTOMY     bladder drop    Family History  Problem Relation Age of Onset  . Cancer Sister        breast  . Pulmonary fibrosis Mother 47  . Diabetes Father   . Hypertension Father   . Stroke Father 71  . Hyperlipidemia Father   . CAD Sister   . Cancer Sister        breast  . Stroke Sister    Social History  Substance Use Topics  . Smoking status: Never Smoker  . Smokeless tobacco: Never Used  . Alcohol use No   OB History    No data available     Review of Systems  Constitutional: Negative.   HENT: Negative.   Eyes: Negative.   Respiratory: Negative.   Cardiovascular: Negative.   Gastrointestinal: Negative.   Endocrine: Negative.   Genitourinary: Positive for dysuria and frequency.  Musculoskeletal: Negative.   Allergic/Immunologic: Negative.   Neurological: Negative.   Hematological: Negative.   Psychiatric/Behavioral: Negative.     Allergies  Naproxen  Home Medications   Prior to Admission medications   Medication Sig Start Date End Date Taking? Authorizing Provider  acetaminophen (TYLENOL) 650 MG CR tablet Take 650 mg by mouth 2 (two) times daily.    [provider]  amLODipine (NORVASC) 5 MG tablet TAKE ONE  TABLET BY MOUTH ONCE DAILY 12/23/15   Ria Bush, MD  aspirin EC 81 MG tablet Take 1 tablet (81 mg total) by mouth every Monday, Wednesday, and Friday. 12/19/15   Ria Bush, MD  atorvastatin (LIPITOR) 20 MG tablet TAKE ONE TABLET BY MOUTH ONCE DAILY 03/29/16   Ria Bush, MD  calcium carbonate (OS-CAL) 600 MG TABS tablet Take 1,200 mg by mouth daily with breakfast.    [provider]  Cholecalciferol (VITAMIN D) 2000 UNITS CAPS Take 1 capsule by mouth daily.    [provider]  losartan (COZAAR) 100 MG tablet TAKE ONE TABLET BY MOUTH ONCE DAILY 12/28/15   Ria Bush, MD  Multiple Vitamin (MULTIVITAMIN) tablet Take 1 tablet by mouth daily.    [provider]  nitrofurantoin, macrocrystal-monohydrate, (MACROBID) 100 MG  capsule Take 1 capsule (100 mg total) by mouth 2 (two) times daily. 09/22/16   Lysbeth Penner, FNP  phenazopyridine (PYRIDIUM) 200 MG tablet Take 1 tablet (200 mg total) by mouth 3 (three) times daily. 09/22/16   Lysbeth Penner, FNP   Meds Ordered and Administered this Visit  Medications - No data to display  BP (!) 159/65 (BP Location: Right Arm)   Pulse 82   Temp 98.4 F (36.9 C) (Oral)   Resp 18   SpO2 95%  No data found.   Physical Exam  Constitutional: She appears well-developed and well-nourished.  HENT:  Head: Normocephalic and atraumatic.  Eyes: Conjunctivae and EOM are normal. Pupils are equal, round, and reactive to light.  Neck: Normal range of motion. Neck supple.  Cardiovascular: Normal rate, regular rhythm and normal heart sounds.   Pulmonary/Chest: Effort normal and breath sounds normal.  Nursing note and vitals reviewed.   Urgent Care Course     Procedures (including critical care time)  Labs Review Labs Reviewed  URINE CULTURE    Imaging Review No results found.   Visual Acuity Review  Right Eye Distance:   Left Eye Distance:   Bilateral Distance:    Right Eye Near:   Left Eye Near:    Bilateral Near:         MDM   1. Lower urinary tract infectious disease   2. Dysuria    cipro 500mg  one po bid x 7 days #14 Pyridium 200mg  one po tid x 3d #9  Push po fluids, rest, tylenol and motrin otc prn as directed for fever, arthralgias, and myalgias.  Follow up prn if sx's continue or persist.    Lysbeth Penner, FNP 09/23/16 1029

## 2016-09-23 NOTE — Telephone Encounter (Signed)
Pt returned today with urine specimen to obtain U/A and culture. She was unable to viod last night when she was seen. Told to bring urine back today per Rush Landmark NP

## 2016-09-25 LAB — URINE CULTURE: Culture: >=100000 — AB

## 2016-09-28 ENCOUNTER — Ambulatory Visit: Payer: Medicare Other | Attending: Family Medicine

## 2016-09-28 DIAGNOSIS — H8112 Benign paroxysmal vertigo, left ear: Secondary | ICD-10-CM | POA: Insufficient documentation

## 2016-09-28 DIAGNOSIS — R2689 Other abnormalities of gait and mobility: Secondary | ICD-10-CM

## 2016-09-28 NOTE — Therapy (Signed)
[facility] [address] [place], [place], [address] Phone: [phone]   Fax:  [phone]  Physical Therapy Treatment  Patient Details  Name: Carrie Lucas MRN: [id number] Date of Birth: 04-30-1940 Referring Provider: Dr. Danise Mina  Encounter Date: 09/28/2016      PT End of Session - 09/28/16 0824    Visit Number 3   Number of Visits 9   Date for PT Re-Evaluation 10/15/16   Authorization Type Medicare: G-CODE AND PROGRESS NOTE EVERY 10TH VISIT   PT Start Time 0804   PT Stop Time 0819   PT Time Calculation (min) 15 min   Activity Tolerance Patient tolerated treatment well;Other (comment)  dizziness 2/2 medication side effect   Behavior During Therapy WFL for tasks assessed/performed      Past Medical History:  Diagnosis Date  . Arthritis    R middle finger, chronic arthralgias  . BCC (basal cell carcinoma of skin) 06/2015   supraclavicular, nasal bridge - [name]  . Carotid stenosis, asymptomatic 12/16/2014   Mild bilateral 1-39%, consider rpt [place] 1 yr (11/2014)   . Colon polyp   . DDD (degenerative disc disease), lumbar 2014   severe facet degeneration L4/5, mod spinal setnosis, mild foraminal stenosis  . Headache   . Hepatic steatosis 2012   mild by CT  . History of melanoma 2003   face (Dr Luetta Nutting)  . HLD (hyperlipidemia)   . HTN (hypertension)   . IBS (irritable bowel syndrome)   . Insomnia   . LPRD (laryngopharyngeal reflux disease)    [facility] Baseman)  . Osteopenia 2011   DEXA T -1.6 hip, -1.3 forearm (12/2015)  . Sialadenitis 05/2013   acute [facility] Baseman)    Past Surgical History:  Procedure Laterality Date  . ABI  06/2010   WNL  . BACK SURGERY  1995   lumbar  . BILATERAL OOPHORECTOMY  2007   ovarian cysts  . BLADDER SUSPENSION    . CARPAL TUNNEL RELEASE Bilateral 2000  . COLONOSCOPY  8 30 12    TAx1, diverticulosis, rpt 5 yrs (Outlaw)  . DEXA  2011   mild osteopenia (T -1.2 R femur)  . ESI  2014   L  L4 transforaminal  . ESOPHAGOGASTRODUODENOSCOPY  8 30 12    WNL  . MOHS SURGERY  2016   BCC  . TONSILLECTOMY  Childhood  . VAGINAL HYSTERECTOMY     bladder drop    There were no vitals filed for this visit.      Subjective Assessment - 09/28/16 0807    Subjective Pt reports she started Cipro last Thursday for an UTI, and it makes her feel dizzy (not the same as the spinning/vertigo dizziness). Pt reports the medication is the cause of new unsteadiness/"swimmy-headed" feeling.   Pertinent History HTN, carotid stenosis, hepatic steatosis, DDD Lx spine, osteopenia, OA, CKD stage III, hx of melanoma, HLD   Patient Stated Goals To get rid of all of this.    Currently in Pain? No/denies                Vestibular Assessment - 09/28/16 0001      Dix-Hallpike Right   Dix-Hallpike Right Duration none   Dix-Hallpike Right Symptoms No nystagmus     Dix-Hallpike Left   Dix-Hallpike Left Duration none   Dix-Hallpike Left Symptoms No nystagmus     Horizontal Canal Right   Horizontal Canal Right Duration none   Horizontal Canal Right Symptoms Normal     Horizontal Canal  Left   Horizontal Canal Left Duration none   Horizontal Canal Left Symptoms Normal                         PT Education - 09/28/16 0865    Education provided Yes   Education Details Pt discussed negative testing for BPPV and holding PT until pt completes antibiotic regimen, as she is likely experiencing different type of dizziness 2/2 side effect from medication. Pt would continue to benefit from PT to improve vestibular input and balance, after medication is finished.    Person(s) Educated Patient   Methods Explanation   Comprehension Verbalized understanding          PT Short Term Goals - 09/15/16 1532      PT SHORT TERM GOAL #1   Title same as LTGs.           PT Long Term Goals - 09/22/16 0934      PT LONG TERM GOAL #1   Title Pt will report 0/10 dizziness during all  positional testing to improve safety during ADLs and improve quality of life. TARGET DATE FOR ALL LTGS: 10/13/16   Status New     PT LONG TERM GOAL #2   Title Pt will decrease falls risk during ambulation as indicated by increase in FGA score of > or = 23/30   Baseline 20/30   Status Revised     PT LONG TERM GOAL #3   Title Pt will improve DHI score to >/=8% to improve quality of life and reduce impact of dizziness on daily activities.    Status New     PT LONG TERM GOAL #4   Title Pt will amb. 1000' while performing head turns, IND, without dizziness and no LOB to improve safety during functional mobility.    Status New     PT LONG TERM GOAL #5   Title Provide HEP as necessary to reduce dizziness and improve balance.   Status New               Plan - 09/28/16 0825    Clinical Impression Statement Pt demonstrated progress, as all positional testing negative for BPPV. Pt is now experiencing a different type of dizziness, likely 2/2 Cipro side effect. PT did not perform further vestibular input treatment, as pt reported nausea/dizziness during head turns. PT will hold treatment until Cipro completed (next week). Continue with POC.    Rehab Potential Excellent   Clinical Impairments Affecting Rehab Potential hx of vertigo   PT Frequency 1x / week   PT Duration 4 weeks   PT Treatment/Interventions ADLs/Self Care Home Management;Biofeedback;Canalith Repostioning;Neuromuscular re-education;Balance training;Therapeutic exercise;Therapeutic activities;Manual techniques;Functional mobility training;Gait training;Stair training;Orthotic Fit/Training;DME Instruction;Patient/family education;Vestibular   PT Next Visit Plan Assess for BPPV (L pBPPV) and treat as indicated, practice Epley self treatment; progress corner balance exercises, habituation to head movements up/down and bending down   Consulted and Agree with Plan of Care Patient      Patient will benefit from skilled therapeutic  intervention in order to improve the following deficits and impairments:  Abnormal gait, Decreased balance, Dizziness  Visit Diagnosis: BPPV (benign paroxysmal positional vertigo), left  Other abnormalities of gait and mobility     Problem List Patient Active Problem List   Diagnosis Date Noted  . BPPV (benign paroxysmal positional vertigo), left 09/07/2016  . Advanced care planning/counseling discussion 12/16/2014  . Carotid stenosis, asymptomatic 12/16/2014  . Medicare annual wellness visit, subsequent 12/12/2013  .  CKD (chronic kidney disease) stage 3, GFR 30-59 ml/min 12/12/2013  . DDD (degenerative disc disease), lumbar   . Hepatic steatosis   . Osteopenia   . HTN (hypertension)   . HLD (hyperlipidemia)   . LPRD (laryngopharyngeal reflux disease)   . Insomnia   . IBS (irritable bowel syndrome)   . Osteoarthritis   . Abdominal bloating 09/09/2013    Afifa Truax L 09/28/2016, 8:27 AM  [facility] [address] [place], [place], [address] Phone: [phone]   Fax:  [phone]  Name: Carrie Lucas MRN: [id number] Date of Birth: [birth year]  Geoffry Paradise, PT,DPT 09/28/16 8:28 AM Phone: [phone] Fax: [phone]

## 2016-10-05 ENCOUNTER — Ambulatory Visit: Payer: Medicare Other

## 2016-10-05 VITALS — BP 145/71 | HR 76

## 2016-10-05 DIAGNOSIS — R2689 Other abnormalities of gait and mobility: Secondary | ICD-10-CM | POA: Diagnosis not present

## 2016-10-05 DIAGNOSIS — H8112 Benign paroxysmal vertigo, left ear: Secondary | ICD-10-CM | POA: Diagnosis not present

## 2016-10-05 NOTE — Therapy (Signed)
[facility] [address] [place], [place], [address] Phone: [phone]   Fax:  [phone]  Physical Therapy Treatment  Patient Details  Name: Carrie Lucas MRN: [id number] Date of Birth: 1940/09/29 Referring Provider: Dr. Danise Mina  Encounter Date: 10/05/2016      PT End of Session - 10/05/16 1709    Visit Number 4   Number of Visits 9   Date for PT Re-Evaluation 10/15/16   Authorization Type Medicare: G-CODE AND PROGRESS NOTE EVERY 10TH VISIT   PT Start Time 0847   PT Stop Time 0929   PT Time Calculation (min) 42 min   Equipment Utilized During Treatment Gait belt   Activity Tolerance Patient tolerated treatment well   Behavior During Therapy [facility] for tasks assessed/performed      Past Medical History:  Diagnosis Date  . Arthritis    R middle finger, chronic arthralgias  . BCC (basal cell carcinoma of skin) 06/2015   supraclavicular, nasal bridge - [name]  . Carotid stenosis, asymptomatic 12/16/2014   Mild bilateral 1-39%, consider rpt [place] 1 yr (11/2014)   . Colon polyp   . DDD (degenerative disc disease), lumbar 2014   severe facet degeneration L4/5, mod spinal setnosis, mild foraminal stenosis  . Headache   . Hepatic steatosis 2012   mild by CT  . History of melanoma 2003   face (Dr Luetta Nutting)  . HLD (hyperlipidemia)   . HTN (hypertension)   . IBS (irritable bowel syndrome)   . Insomnia   . LPRD (laryngopharyngeal reflux disease)    [facility] Baseman)  . Osteopenia 2011   DEXA T -1.6 hip, -1.3 forearm (12/2015)  . Sialadenitis 05/2013   acute [facility] Baseman)    Past Surgical History:  Procedure Laterality Date  . ABI  06/2010   WNL  . BACK SURGERY  1995   lumbar  . BILATERAL OOPHORECTOMY  2007   ovarian cysts  . BLADDER SUSPENSION    . CARPAL TUNNEL RELEASE Bilateral 2000  . COLONOSCOPY  8 30 12    TAx1, diverticulosis, rpt 5 yrs ([facility])  . DEXA  2011   mild osteopenia (T -1.2 R femur)  . ESI  2014   L L4  transforaminal  . ESOPHAGOGASTRODUODENOSCOPY  8 30 12    WNL  . MOHS SURGERY  2016   BCC  . TONSILLECTOMY  Childhood  . VAGINAL HYSTERECTOMY     bladder drop    Vitals:   10/05/16 0927  BP: (!) 145/71  Pulse: 76        Subjective Assessment - 10/05/16 0849    Subjective Pt reported she experienced some dizziness when lying down (straight back) this morning and it resolved after approx. 5 seconds. Pt reported she tried lying down again after dizziness and did not get dizzy. Pt reported she finished Cipro. Pt denied falls since last visit. Pt would like to go back to [employer], PT encouraged pt to do so.    Pertinent History HTN, carotid stenosis, hepatic steatosis, DDD Lx spine, osteopenia, OA, CKD stage III, hx of melanoma, HLD   Patient Stated Goals To get rid of all of this.    Currently in Pain? No/denies                Vestibular Assessment - 10/05/16 0851      Dix-Hallpike Right   Dix-Hallpike Right Duration none   Dix-Hallpike Right Symptoms No nystagmus     Dix-Hallpike Left   Dix-Hallpike Left Duration none  Dix-Hallpike Left Symptoms No nystagmus     Horizontal Canal Right   Horizontal Canal Right Duration none   Horizontal Canal Right Symptoms Normal     Horizontal Canal Left   Horizontal Canal Left Duration none   Horizontal Canal Left Symptoms Normal                      Balance Exercises - 10/05/16 0916      Balance Exercises: Standing   Standing Eyes Opened Narrow base of support (BOS);Wide (BOA);Head turns;Foam/compliant surface;Solid surface;3 reps;10 secs;30 secs   Standing Eyes Closed Narrow base of support (BOS);Wide (BOA);Head turns;Foam/compliant surface;Solid surface;3 reps;10 secs;30 secs   Tandem Stance Eyes open;Intermittent upper extremity support;3 reps;30 secs   SLS Eyes open;Solid surface;Intermittent upper extremity support;3 reps;10 secs   Rockerboard Anterior/posterior;Head turns;EO;EC;10 seconds;30  seconds;10 reps  no UE support   Other Standing Exercises Performed in corner with chair in front of pt: cues and demo for technique. S for safety. Please see pt instructions for HEP details. Pt also performed gait at counter with head turns 4x7'. NO dizziness reported.     Please see pt instructions for details.        PT Education - 10/05/16 1709    Education provided Yes   Education Details PT educated pt on negative positional testing and provided pt with balance HEP.    Person(s) Educated Patient   Methods Explanation;Demonstration;Verbal cues;Handout   Comprehension Returned demonstration;Verbalized understanding          PT Short Term Goals - 09/15/16 1532      PT SHORT TERM GOAL #1   Title same as LTGs.           PT Long Term Goals - 09/22/16 0934      PT LONG TERM GOAL #1   Title Pt will report 0/10 dizziness during all positional testing to improve safety during ADLs and improve quality of life. TARGET DATE FOR ALL LTGS: 10/13/16   Status New     PT LONG TERM GOAL #2   Title Pt will decrease falls risk during ambulation as indicated by increase in FGA score of > or = 23/30   Baseline 20/30   Status Revised     PT LONG TERM GOAL #3   Title Pt will improve DHI score to >/=8% to improve quality of life and reduce impact of dizziness on daily activities.    Status New     PT LONG TERM GOAL #4   Title Pt will amb. 1000' while performing head turns, IND, without dizziness and no LOB to improve safety during functional mobility.    Status New     PT LONG TERM GOAL #5   Title Provide HEP as necessary to reduce dizziness and improve balance.   Status New               Plan - 10/05/16 1709    Clinical Impression Statement Pt demonstrated progress, as positional testing continues to be negative. Pt continues to experience incr. postural sway during activities which require incr. vestibular input. Pt would continue to benefit from skilled PT to improve  safety during functional mobility.    Rehab Potential Excellent   Clinical Impairments Affecting Rehab Potential hx of vertigo   PT Frequency 1x / week   PT Duration 4 weeks   PT Treatment/Interventions ADLs/Self Care Home Management;Biofeedback;Canalith Repostioning;Neuromuscular re-education;Balance training;Therapeutic exercise;Therapeutic activities;Manual techniques;Functional mobility training;Gait training;Stair training;Orthotic Fit/Training;DME Instruction;Patient/family education;Vestibular   PT  Next Visit Plan High level balance and gait over uneven/compliant surfaces.    Consulted and Agree with Plan of Care Patient      Patient will benefit from skilled therapeutic intervention in order to improve the following deficits and impairments:  Abnormal gait, Decreased balance, Dizziness  Visit Diagnosis: Other abnormalities of gait and mobility  BPPV (benign paroxysmal positional vertigo), left     Problem List Patient Active Problem List   Diagnosis Date Noted  . BPPV (benign paroxysmal positional vertigo), left 09/07/2016  . Advanced care planning/counseling discussion 12/16/2014  . Carotid stenosis, asymptomatic 12/16/2014  . Medicare annual wellness visit, subsequent 12/12/2013  . CKD (chronic kidney disease) stage 3, GFR 30-59 ml/min 12/12/2013  . DDD (degenerative disc disease), lumbar   . Hepatic steatosis   . Osteopenia   . HTN (hypertension)   . HLD (hyperlipidemia)   . LPRD (laryngopharyngeal reflux disease)   . Insomnia   . IBS (irritable bowel syndrome)   . Osteoarthritis   . Abdominal bloating 09/09/2013    Nazareth Norenberg L 10/05/2016, 5:11 PM  [facility] [address] [place], [place], [address] Phone: [phone]   Fax:  [phone]  Name: EURETHA NAJARRO MRN: [id number] Date of Birth: 1941/04/26  Geoffry Paradise, PT,DPT 10/05/16 5:12 PM Phone: [phone] Fax: [phone]

## 2016-10-05 NOTE — Patient Instructions (Addendum)
Perform in a corner with chair in front of you for safety OR at kitchen sink with chair behind you for safety:  Feet Together (Compliant Surface) Head Motion - Eyes Closed    Stand on compliant surface: __pillow/cushion______ with feet together. Close eyes and move head slowly, up and down 10 times and side to side 10 times. Repeat __3__ times per session. Do __1__ sessions per day.  Copyright  VHI. All rights reserved.  Feet Heel-Toe "Tandem", Varied Arm Positions - Eyes Open    With eyes open, right foot directly in front of the other, arms at your, look straight ahead at a stationary object. Hold __30__ seconds. Repeat with other foot in front of you. Repeat __3__ times per session. Do __1__ sessions per day.  Copyright  VHI. All rights reserved.  Single Leg - Eyes Open    Holding support, lift right leg while maintaining balance over other leg. Progress to removing hands from support surface for longer periods of time. Repeat with other leg. Hold__10-30__ seconds. Repeat __3__ times per session per leg. Do ___1_ sessions per day.  Copyright  VHI. All rights reserved.

## 2016-10-12 ENCOUNTER — Ambulatory Visit: Payer: Medicare Other

## 2016-10-12 DIAGNOSIS — H8112 Benign paroxysmal vertigo, left ear: Secondary | ICD-10-CM

## 2016-10-12 DIAGNOSIS — R2689 Other abnormalities of gait and mobility: Secondary | ICD-10-CM

## 2016-10-12 NOTE — Therapy (Signed)
[facility] [address] [place], [place], [address] Phone: [phone]   Fax:  [phone]  Physical Therapy Treatment  Patient Details  Name: Carrie Lucas MRN: [id number] Date of Birth: 01-18-1941 Referring Provider: Dr. Danise Mina  Encounter Date: 10/12/2016      PT End of Session - 10/12/16 1622    Visit Number 5   Number of Visits 9   Date for PT Re-Evaluation 10/15/16 (new POC: 11/14/16)   Authorization Type Medicare: G-CODE AND PROGRESS NOTE EVERY 10TH VISIT   PT Start Time 1530   PT Stop Time 1615   PT Time Calculation (min) 45 min   Equipment Utilized During Treatment --  min guard to S prn   Activity Tolerance Patient tolerated treatment well   Behavior During Therapy [facility] for tasks assessed/performed      Past Medical History:  Diagnosis Date  . Arthritis    R middle finger, chronic arthralgias  . BCC (basal cell carcinoma of skin) 06/2015   supraclavicular, nasal bridge - [name]  . Carotid stenosis, asymptomatic 12/16/2014   Mild bilateral 1-39%, consider rpt [place] 1 yr (11/2014)   . Colon polyp   . DDD (degenerative disc disease), lumbar 2014   severe facet degeneration L4/5, mod spinal setnosis, mild foraminal stenosis  . Headache   . Hepatic steatosis 2012   mild by CT  . History of melanoma 2003   face (Dr Luetta Nutting)  . HLD (hyperlipidemia)   . HTN (hypertension)   . IBS (irritable bowel syndrome)   . Insomnia   . LPRD (laryngopharyngeal reflux disease)    [facility] Baseman)  . Osteopenia 2011   DEXA T -1.6 hip, -1.3 forearm (12/2015)  . Sialadenitis 05/2013   acute [facility] Baseman)    Past Surgical History:  Procedure Laterality Date  . ABI  06/2010   WNL  . BACK SURGERY  1995   lumbar  . BILATERAL OOPHORECTOMY  2007   ovarian cysts  . BLADDER SUSPENSION    . CARPAL TUNNEL RELEASE Bilateral 2000  . COLONOSCOPY  _0 TAx1, diverticulosis, rpt 5 yrs (Outlaw)  . DEXA  2011   mild osteopenia (T -1.2  R femur)  . ESI  2014   L L4 transforaminal  . ESOPHAGOGASTRODUODENOSCOPY  _1 WNL  . MOHS SURGERY  2016   BCC  . TONSILLECTOMY  Childhood  . VAGINAL HYSTERECTOMY     bladder drop    There were no vitals filed for this visit.      Subjective Assessment - 10/12/16 1532    Subjective Pt reported BP was elevated today (130's-150's/70's), and she's not sure if that is causing dizziness. Pt reported dizziness when lying supine. Pt denied falls since last visit.    Pertinent History HTN, carotid stenosis, hepatic steatosis, DDD Lx spine, osteopenia, OA, CKD stage III, hx of melanoma, HLD   Patient Stated Goals To get rid of all of this.    Currently in Pain? No/denies            [facility] PT Assessment - 10/12/16 1554      Functional Gait  Assessment   Gait assessed  Yes   Gait Level Surface Walks 20 ft in less than 5.5 sec, no assistive devices, good speed, no evidence for imbalance, normal gait pattern, deviates no more than 6 in outside of the 12 in walkway width.   Change in Gait Speed Able to smoothly change walking speed  without loss of balance or gait deviation. Deviate no more than 6 in outside of the 12 in walkway width.   Gait with Horizontal Head Turns Performs head turns smoothly with no change in gait. Deviates no more than 6 in outside 12 in walkway width   Gait with Vertical Head Turns Performs head turns with no change in gait. Deviates no more than 6 in outside 12 in walkway width.   Gait and Pivot Turn Pivot turns safely within 3 sec and stops quickly with no loss of balance.   Step Over Obstacle Is able to step over 2 stacked shoe boxes taped together (9 in total height) without changing gait speed. No evidence of imbalance.   Gait with Narrow Base of Support Ambulates 4-7 steps.   Gait with Eyes Closed Walks 20 ft, uses assistive device, slower speed, mild gait deviations, deviates 6-10 in outside 12 in walkway width. Ambulates 20 ft in less than 9 sec but  greater than 7 sec.   Ambulating Backwards Walks 20 ft, no assistive devices, good speed, no evidence for imbalance, normal gait   Steps Alternating feet, must use rail.  handrail to descend steps   Total Score 26   FGA comment: 26/30            Vestibular Assessment - 10/12/16 1535      Dix-Hallpike Right   Dix-Hallpike Right Duration none   Dix-Hallpike Right Symptoms No nystagmus     Dix-Hallpike Left   Dix-Hallpike Left Duration <10 seconds with 5/10 dizziness.   Dix-Hallpike Left Symptoms Upbeat, left rotatory nystagmus     Horizontal Canal Right   Horizontal Canal Right Duration none   Horizontal Canal Right Symptoms Normal     Horizontal Canal Left   Horizontal Canal Left Duration none   Horizontal Canal Left Symptoms Normal                  Vestibular Treatment/Exercise - 10/12/16 1540      Vestibular Treatment/Exercise   Vestibular Treatment Provided Canalith Repositioning   Canalith Repositioning Epley Manuever Left   Habituation Exercises Brandt Daroff      EPLEY MANUEVER LEFT   Number of Reps  2   Overall Response  Symptoms Resolved    RESPONSE DETAILS LEFT Pt reported no dizziness.     Nestor Lewandowsky   Number of Reps  3   Symptom Description  Pt required cues for technique. Please see pt instructions for details.             Balance Exercises - 10/12/16 1618      Balance Exercises: Standing   Standing Eyes Closed Narrow base of support (BOS);Head turns;Foam/compliant surface;30 secs;Other reps (comment)  10 reps of head turns/nods   Tandem Stance Eyes open;Intermittent upper extremity support;3 reps;30 secs   SLS Eyes open;Solid surface;Intermittent upper extremity support;3 reps;10 secs   Other Standing Exercises Reveiwed balance HEP: Performed in corner with chair in front of pt with S for safety. Please see pt instructions for details.            PT Education - 10/12/16 1621    Education provided Yes   Education Details  PT discussed exam findings and goal progress. PT educated pt on renewing for additional 1x/week for 4 weeks to ensure vertigo is cleared and balance improved. PT provided pt with Nestor Lewandowsky HEP.   Person(s) Educated Patient   Methods Explanation;Demonstration;Verbal cues;Handout   Comprehension Verbalized understanding;Returned demonstration  PT Short Term Goals - 09/15/16 1532      PT SHORT TERM GOAL #1   Title same as LTGs.           PT Long Term Goals - 10/12/16 1625      PT LONG TERM GOAL #1   Title Pt will report 0/10 dizziness during all positional testing to improve safety during ADLs and improve quality of life. TARGET DATE FOR ALL LTGS: 11/10/16: all unmet goals will be carried over to new POC.   Status Not Met     PT LONG TERM GOAL #2   Title Pt will decrease falls risk during ambulation as indicated by increase in FGA score of > or = 28/30   Baseline 26/30 on 10/12/16   Status Revised     PT LONG TERM GOAL #3   Title Pt will improve DHI score to >/=8% to improve quality of life and reduce impact of dizziness on daily activities.    Status On-going     PT LONG TERM GOAL #4   Title Pt will amb. 1000' while performing head turns, IND, without dizziness and no LOB to improve safety during functional mobility.    Status On-going     PT LONG TERM GOAL #5   Title Provide HEP as necessary to reduce dizziness and improve balance.   Status Achieved               Plan - 10/12/16 1622    Clinical Impression Statement Pt demonstrated progress, as she met LTGs 2 and 5, pt did not meet LTG 1 as she experienced nystagmus and dizziness during L Dix-Hallpike. PT deferred LTG 4, as pt was hot and the heat outside was nearly 100 degrees, PT will assess next session. LTG 3 will be assessed upon d/c. PT requesting additional 1x/week for 4 weeks to improve vertigo and balance.    Rehab Potential Excellent   Clinical Impairments Affecting Rehab Potential hx of  vertigo   PT Frequency 1x / week   PT Duration 4 weeks   PT Treatment/Interventions ADLs/Self Care Home Management;Biofeedback;Canalith Repostioning;Neuromuscular re-education;Balance training;Therapeutic exercise;Therapeutic activities;Manual techniques;Functional mobility training;Gait training;Stair training;Orthotic Fit/Training;DME Instruction;Patient/family education;Vestibular   PT Next Visit Plan High level balance and gait over uneven/compliant surfaces. Assess for L pBPPV and treat as indicated.   Consulted and Agree with Plan of Care Patient      Patient will benefit from skilled therapeutic intervention in order to improve the following deficits and impairments:  Abnormal gait, Decreased balance, Dizziness  Visit Diagnosis: BPPV (benign paroxysmal positional vertigo), left - Plan: PT plan of care cert/re-cert  Other abnormalities of gait and mobility - Plan: PT plan of care cert/re-cert     Problem List Patient Active Problem List   Diagnosis Date Noted  . BPPV (benign paroxysmal positional vertigo), left 09/07/2016  . Advanced care planning/counseling discussion 12/16/2014  . Carotid stenosis, asymptomatic 12/16/2014  . Medicare annual wellness visit, subsequent 12/12/2013  . CKD (chronic kidney disease) stage 3, GFR 30-59 ml/min 12/12/2013  . DDD (degenerative disc disease), lumbar   . Hepatic steatosis   . Osteopenia   . HTN (hypertension)   . HLD (hyperlipidemia)   . LPRD (laryngopharyngeal reflux disease)   . Insomnia   . IBS (irritable bowel syndrome)   . Osteoarthritis   . Abdominal bloating 09/09/2013    Alahna Dunne L 10/12/2016, 4:29 PM  [facility] [address] [place], [place], [address] Phone: [phone]  Fax:  [phone]  Name: ANADALAY MACDONELL MRN: [id number] Date of Birth: 22-Apr-1941  Geoffry Paradise, PT,DPT 10/12/16 4:31 PM Phone: [phone] Fax: [phone]

## 2016-10-12 NOTE — Patient Instructions (Addendum)
Sit to Side-Lying    Sit on edge of bed. 1. Turn head 45 to right. 2. Maintain head position and lie down slowly on left side. Hold until symptoms subside. 3. Sit up slowly. Hold until symptoms subside. 4. Turn head 45 to left. 5. Maintain head position and lie down slowly on right side. Hold until symptoms subside. 6. Sit up slowly. Repeat sequence __3__ times per session. Do __1-2__ sessions per day.  Copyright  VHI. All rights reserved.    Perform in a corner with chair in front of you for safety OR at kitchen sink with chair behind you for safety:  Feet Together (Compliant Surface) Head Motion - Eyes Closed    Stand on compliant surface: __pillow/cushion______ with feet together. Close eyes and move head slowly, up and down 10 times and side to side 10 times. Repeat __3__ times per session. Do __1__ sessions per day.  Copyright  VHI. All rights reserved.  Feet Heel-Toe "Tandem", Varied Arm Positions - Eyes Open    With eyes open, right foot directly in front of the other, arms at your, look straight ahead at a stationary object. Hold __30__ seconds. Repeat with other foot in front of you. Repeat __3__ times per session. Do __1__ sessions per day.  Copyright  VHI. All rights reserved.  Single Leg - Eyes Open    Holding support, lift right leg while maintaining balance over other leg. Progress to removing hands from support surface for longer periods of time. Repeat with other leg. Hold__10-30__ seconds. Repeat __3__ times per session per leg. Do ___1_ sessions per day.  Copyright  VHI. All rights reserved.

## 2016-10-13 ENCOUNTER — Ambulatory Visit: Payer: Medicare Other | Admitting: Physical Therapy

## 2016-10-20 ENCOUNTER — Ambulatory Visit: Payer: Medicare Other | Admitting: Physical Therapy

## 2016-10-20 ENCOUNTER — Encounter: Payer: Self-pay | Admitting: Physical Therapy

## 2016-10-20 VITALS — BP 152/76 | HR 73

## 2016-10-20 DIAGNOSIS — H8112 Benign paroxysmal vertigo, left ear: Secondary | ICD-10-CM

## 2016-10-20 DIAGNOSIS — R2689 Other abnormalities of gait and mobility: Secondary | ICD-10-CM | POA: Diagnosis not present

## 2016-10-20 NOTE — Therapy (Signed)
[facility] [address] [place], [place], [address] Phone: [phone]   Fax:  [phone]  Physical Therapy Treatment  Patient Details  Name: Carrie Lucas MRN: [id number] Date of Birth: 08/02/1940 Referring Provider: Dr. Danise Mina  Encounter Date: 10/20/2016      PT End of Session - 10/20/16 0842    Visit Number 6   Number of Visits 9   Date for PT Re-Evaluation 11/14/16   Authorization Type Medicare: G-CODE AND PROGRESS NOTE EVERY 10TH VISIT   PT Start Time 0800   PT Stop Time 0840   PT Time Calculation (min) 40 min   Equipment Utilized During Treatment --  min guard to S prn   Activity Tolerance Patient tolerated treatment well   Behavior During Therapy [facility] for tasks assessed/performed      Past Medical History:  Diagnosis Date  . Arthritis    R middle finger, chronic arthralgias  . BCC (basal cell carcinoma of skin) 06/2015   supraclavicular, nasal bridge - [name]  . Carotid stenosis, asymptomatic 12/16/2014   Mild bilateral 1-39%, consider rpt [place] 1 yr (11/2014)   . Colon polyp   . DDD (degenerative disc disease), lumbar 2014   severe facet degeneration L4/5, mod spinal setnosis, mild foraminal stenosis  . Headache   . Hepatic steatosis 2012   mild by CT  . History of melanoma 2003   face (Dr Luetta Nutting)  . HLD (hyperlipidemia)   . HTN (hypertension)   . IBS (irritable bowel syndrome)   . Insomnia   . LPRD (laryngopharyngeal reflux disease)    [facility] Baseman)  . Osteopenia 2011   DEXA T -1.6 hip, -1.3 forearm (12/2015)  . Sialadenitis 05/2013   acute [facility] Baseman)    Past Surgical History:  Procedure Laterality Date  . ABI  06/2010   WNL  . BACK SURGERY  1995   lumbar  . BILATERAL OOPHORECTOMY  2007   ovarian cysts  . BLADDER SUSPENSION    . CARPAL TUNNEL RELEASE Bilateral 2000  . COLONOSCOPY  _0 TAx1, diverticulosis, rpt 5 yrs ([facility])  . DEXA  2011   mild osteopenia (T -1.2 R femur)  . ESI   2014   L L4 transforaminal  . ESOPHAGOGASTRODUODENOSCOPY  _1 WNL  . MOHS SURGERY  2016   BCC  . TONSILLECTOMY  Childhood  . VAGINAL HYSTERECTOMY     bladder drop    Vitals:   10/20/16 0810 10/20/16 0824  BP: (!) 150/74 (!) 152/76  Pulse: 73 73        Subjective Assessment - 10/20/16 0806    Subjective Pt very frustrated this am; reports the dizziness just isnt going away!  Has been performing Brandt-Daroff and feels dizzy on first repetition but then it settles with the second and third repetition.  Reports bending down and almost falling due to the dizziness.  Also reports BP continues to be high systolic.   Pertinent History HTN, carotid stenosis, hepatic steatosis, DDD Lx spine, osteopenia, OA, CKD stage III, hx of melanoma, HLD   Patient Stated Goals To get rid of all of this.                 Vestibular Assessment - 10/20/16 0813      Positional Testing   Dix-Hallpike Dix-Hallpike Right;Dix-Hallpike Left     Dix-Hallpike Right   Dix-Hallpike Right Duration none   Dix-Hallpike Right Symptoms No nystagmus  Dix-Hallpike Left   Dix-Hallpike Left Duration 5 seconds   Dix-Hallpike Left Symptoms Upbeat, left rotatory nystagmus                  Vestibular Treatment/Exercise - 10/20/16 0818      Vestibular Treatment/Exercise   Vestibular Treatment Provided Canalith Repositioning   Canalith Repositioning Epley Manuever Left   Habituation Exercises --      EPLEY MANUEVER LEFT   Number of Reps  1   Overall Response  Symptoms Resolved    RESPONSE DETAILS LEFT provided education on sleeping positions and recommending sleeping on R side x 2 nights and holding on Brandt-Daroff x 2 days to prevent repositioning otoconia in posterior canal            Balance Exercises - 10/20/16 0826      Balance Exercises: Standing   Standing Eyes Opened Narrow base of support (BOS);Head turns;Solid surface;Other reps (comment)  R/L tandem, vertical and  horizontal head turns   Standing Eyes Closed Narrow base of support (BOS);Head turns;Foam/compliant surface;Other reps (comment)  10 reps vertical, horizontal, diagonal head turns   Tandem Stance Eyes closed;Upper extremity support 2;2 reps;10 secs           PT Education - 10/20/16 0825    Education provided Yes   Education Details trial of adjusting sleeping position and holding on habituation x 2 days to avoid repositioning otoconia and re-occurence of vertigo; call MD about elevated BP   Person(s) Educated Patient   Methods Explanation   Comprehension Verbalized understanding          PT Short Term Goals - 09/15/16 1532      PT SHORT TERM GOAL #1   Title same as LTGs.           PT Long Term Goals - 10/20/16 0843      PT LONG TERM GOAL #1   Title Pt will report 0/10 dizziness during all positional testing to improve safety during ADLs and improve quality of life. TARGET DATE FOR ALL LTGS: 11/12/16: all unmet goals will be carried over to new POC.   Status Not Met     PT LONG TERM GOAL #2   Title Pt will decrease falls risk during ambulation as indicated by increase in FGA score of > or = 28/30   Baseline 26/30 on 10/12/16   Status Revised     PT LONG TERM GOAL #3   Title Pt will improve DHI score to >/=8% to improve quality of life and reduce impact of dizziness on daily activities.    Status On-going     PT LONG TERM GOAL #4   Title Pt will amb. 1000' while performing head turns, IND, without dizziness and no LOB to improve safety during functional mobility.    Status On-going     PT LONG TERM GOAL #5   Title Pt will be independent with ongoing HEP as necessary to reduce dizziness and improve balance.   Status Revised               Plan - 10/20/16 0845    Clinical Impression Statement Pt continued to present with nystagmus and vertigo during L hallpike-dix indicating ongoing L posterior canal canalithiasis.  Discussed possible causes for otoconia to be  re-entering posterior canal-starting habituation exercises too soon, sleeping position and discussed adjustments.  Treated with CRM x 1 with symptom resolution.  Pt able to tolerate progression of balance HEP.  Will continue to assess and  treat as indicated to continue to make progress towards LTG.   Rehab Potential Excellent   Clinical Impairments Affecting Rehab Potential hx of vertigo   PT Frequency 1x / week   PT Duration 4 weeks   PT Treatment/Interventions ADLs/Self Care Home Management;Biofeedback;Canalith Repostioning;Neuromuscular re-education;Balance training;Therapeutic exercise;Therapeutic activities;Manual techniques;Functional mobility training;Gait training;Stair training;Orthotic Fit/Training;DME Instruction;Patient/family education;Vestibular   PT Next Visit Plan Assess for L pBPPV and treat as indicated.  Continue to progress HEP.     Consulted and Agree with Plan of Care Patient      Patient will benefit from skilled therapeutic intervention in order to improve the following deficits and impairments:  Abnormal gait, Decreased balance, Dizziness  Visit Diagnosis: BPPV (benign paroxysmal positional vertigo), left  Other abnormalities of gait and mobility     Problem List Patient Active Problem List   Diagnosis Date Noted  . BPPV (benign paroxysmal positional vertigo), left 09/07/2016  . Advanced care planning/counseling discussion 12/16/2014  . Carotid stenosis, asymptomatic 12/16/2014  . Medicare annual wellness visit, subsequent 12/12/2013  . CKD (chronic kidney disease) stage 3, GFR 30-59 ml/min 12/12/2013  . DDD (degenerative disc disease), lumbar   . Hepatic steatosis   . Osteopenia   . HTN (hypertension)   . HLD (hyperlipidemia)   . LPRD (laryngopharyngeal reflux disease)   . Insomnia   . IBS (irritable bowel syndrome)   . Osteoarthritis   . Abdominal bloating 09/09/2013    Raylene Everts, PT, DPT 10/20/16    11:35 AM    [facility] [address] [place], [place], [address] Phone: [phone]   Fax:  [phone]  Name: Carrie Lucas MRN: [id number] Date of Birth: January 29, 1941

## 2016-10-20 NOTE — Patient Instructions (Signed)
1) FOR TWO NIGHTS SLEEP ON RIGHT SIDE ONLY; PILLOWS BEHIND YOUR BACK TO KEEP YOU FROM ROLLING TO THE LEFT SIDE  2) WAIT TWO DAYS BEFORE STARTING BRANDT-DAROFF AGAIN (FRIDAY)  3) CORNER BALANCE EXERCISES: STANDING ON PILLOW, EYES CLOSED-ADD DIAGONAL HEAD TURNS

## 2016-10-22 ENCOUNTER — Encounter: Payer: Self-pay | Admitting: Family Medicine

## 2016-10-22 ENCOUNTER — Ambulatory Visit (INDEPENDENT_AMBULATORY_CARE_PROVIDER_SITE_OTHER): Payer: Medicare Other | Admitting: Family Medicine

## 2016-10-22 VITALS — BP 158/78 | HR 80 | Temp 98.4°F | Ht 61.5 in | Wt 159.0 lb

## 2016-10-22 DIAGNOSIS — I10 Essential (primary) hypertension: Secondary | ICD-10-CM | POA: Diagnosis not present

## 2016-10-22 DIAGNOSIS — H8112 Benign paroxysmal vertigo, left ear: Secondary | ICD-10-CM | POA: Diagnosis not present

## 2016-10-22 DIAGNOSIS — N3000 Acute cystitis without hematuria: Secondary | ICD-10-CM | POA: Diagnosis not present

## 2016-10-22 DIAGNOSIS — N390 Urinary tract infection, site not specified: Secondary | ICD-10-CM | POA: Insufficient documentation

## 2016-10-22 DIAGNOSIS — R8290 Unspecified abnormal findings in urine: Secondary | ICD-10-CM | POA: Diagnosis not present

## 2016-10-22 HISTORY — DX: Urinary tract infection, site not specified: N39.0

## 2016-10-22 LAB — POC URINALSYSI DIPSTICK (AUTOMATED)
Glucose, UA: NEGATIVE
PH UA: 6 (ref 5.0–8.0)
SPEC GRAV UA: 1.015 (ref 1.010–1.025)
Urobilinogen, UA: 1 E.U./dL

## 2016-10-22 MED ORDER — CEPHALEXIN 500 MG PO CAPS: 500.0000 mg | ORAL_CAPSULE | Freq: Two times a day (BID) | ORAL | 0 refills | Status: DC

## 2016-10-22 MED ORDER — AMLODIPINE BESYLATE 10 MG PO TABS: 10.0000 mg | ORAL_TABLET | Freq: Every day | ORAL | 1 refills | Status: DC

## 2016-10-22 NOTE — Assessment & Plan Note (Signed)
Not improving despite vestibular rehab. She will let me know if desires referral to ENT Redmond Baseman) whom she has seen in the past.

## 2016-10-22 NOTE — Progress Notes (Signed)
BP (!) 158/78   Pulse 80   Temp 98.4 F (36.9 C)   Ht 5' 1.5" (1.562 m)   Wt 159 lb (72.1 kg)   SpO2 96%   BMI 29.56 kg/m    CC: UTI? Subjective:    Patient ID: Carrie Lucas, female    DOB: 03-May-1940, 76 y.o.   MRN: [id number]  HPI: Carrie Lucas is a 76 y.o. female presenting on 10/22/2016 for Urinary Tract Infection   2d h/o suprapubic pressure, urgency, frequency, incomplete emptying.  She has been treating with azo which helped. No fevers/chills, nausea, flank pain. No hematuria.   Seen at [facility] 09/23/2016 with UTI, treated with cipro 7d course and pyridium. Culture grew >100k Ecoli resistant to cipro, so changed to nitrofurantoin.   Drinks water, avoids soft drinks.  Does not hold urine.   bp staying elevated at home despite amlodipine 5mg  and losartan 100mg  daily. BP Readings from Last 3 Encounters:  10/22/16 (!) 158/78  10/20/16 (!) 152/76  10/05/16 (!) 145/71  Ongoing dizziness despite vestibular rehab x 1 month. Thought left BPPV.   Relevant past medical, surgical, family and social history reviewed and updated as indicated. Interim medical history since our last visit reviewed. Allergies and medications reviewed and updated. Outpatient Medications Prior to Visit  Medication Sig Dispense Refill  . acetaminophen (TYLENOL) 650 MG CR tablet Take 650 mg by mouth 2 (two) times daily.    Marland Kitchen aspirin EC 81 MG tablet Take 1 tablet (81 mg total) by mouth every Monday, Wednesday, and Friday.    Marland Kitchen atorvastatin (LIPITOR) 20 MG tablet TAKE ONE TABLET BY MOUTH ONCE DAILY 90 tablet 1  . calcium carbonate (OS-CAL) 600 MG TABS tablet Take 1,200 mg by mouth daily with breakfast.    . Cholecalciferol (VITAMIN D) 2000 UNITS CAPS Take 1 capsule by mouth daily.    Marland Kitchen losartan (COZAAR) 100 MG tablet TAKE ONE TABLET BY MOUTH ONCE DAILY 90 tablet 3  . Multiple Vitamin (MULTIVITAMIN) tablet Take 1 tablet by mouth daily.    . phenazopyridine (PYRIDIUM) 200 MG tablet Take 1 tablet (200 mg  total) by mouth 3 (three) times daily. 6 tablet 0  . amLODipine (NORVASC) 5 MG tablet TAKE ONE TABLET BY MOUTH ONCE DAILY 90 tablet 3  . nitrofurantoin, macrocrystal-monohydrate, (MACROBID) 100 MG capsule Take 1 capsule (100 mg total) by mouth 2 (two) times daily. (Patient not taking: Reported on 10/22/2016) 20 capsule 0   No facility-administered medications prior to visit.      Per HPI unless specifically indicated in ROS section below Review of Systems     Objective:    BP (!) 158/78   Pulse 80   Temp 98.4 F (36.9 C)   Ht 5' 1.5" (1.562 m)   Wt 159 lb (72.1 kg)   SpO2 96%   BMI 29.56 kg/m   Wt Readings from Last 3 Encounters:  10/22/16 159 lb (72.1 kg)  09/07/16 158 lb 4 oz (71.8 kg)  12/18/15 160 lb (72.6 kg)    Physical Exam  Constitutional: She appears well-developed and well-nourished. No distress.  Abdominal: Soft. Normal appearance and bowel sounds are normal. She exhibits no distension and no mass. There is no hepatosplenomegaly. There is tenderness (mild pressure) in the suprapubic area. There is no rigidity, no rebound, no guarding, no CVA tenderness and negative Murphy's sign.  Nursing note and vitals reviewed.  Results for orders placed or performed in visit on 10/22/16  POCT Urinalysis Dipstick (Automated)  Result Value Ref Range   Color, UA ORANGE    Clarity, UA CLOUDY    Glucose, UA NEG    Bilirubin, UA -    Ketones, UA -    Spec Grav, UA 1.015 1.010 - 1.025   Blood, UA -    pH, UA 6.0 5.0 - 8.0   Protein, UA -    Urobilinogen, UA 1.0 0.2 or 1.0 E.U./dL   Nitrite, UA +    Leukocytes, UA Large (3+) (A) Negative      Assessment & Plan:   Problem List Items Addressed This Visit    BPPV (benign paroxysmal positional vertigo), left    Not improving despite vestibular rehab. She will let me know if desires referral to ENT Redmond Baseman) whom she has seen in the past.       HTN (hypertension)    Chronic, deteriorated in office and at home. Increase  amlodipine to 10mg  daily, continue losartan 100mg  daily.       Relevant Medications   amLODipine (NORVASC) 10 MG tablet   UTI (urinary tract infection) - Primary    Last UTI 1 mo ago treated with nitrofurantoin due to cipro resistance. Will treat today's presumed UTI with keflex course.  Update if not improving with treatment. Pt agrees with plan.       Relevant Medications   cephALEXin (KEFLEX) 500 MG capsule   Other Relevant Orders   POCT Urinalysis Dipstick (Automated) (Completed)   Urine Culture       Follow up plan: Return if symptoms worsen or fail to improve.  Ria Bush, MD

## 2016-10-22 NOTE — Assessment & Plan Note (Signed)
Last UTI 1 mo ago treated with nitrofurantoin due to cipro resistance. Will treat today's presumed UTI with keflex course.  Update if not improving with treatment. Pt agrees with plan.

## 2016-10-22 NOTE — Patient Instructions (Addendum)
Increase amlodipine to 10mg  daily for blood pressure.  Let us know if you'd like to return to see Dr Redmond Baseman.  For urine infection, start keflex 500mg  twice daily with meals.  Push fluids and rest. We will send urine culture.   Urinary Tract Infection, Adult A urinary tract infection (UTI) is an infection of any part of the urinary tract, which includes the kidneys, ureters, bladder, and urethra. These organs make, store, and get rid of urine in the body. UTI can be a bladder infection (cystitis) or kidney infection (pyelonephritis). What are the causes? This infection may be caused by fungi, viruses, or bacteria. Bacteria are the most common cause of UTIs. This condition can also be caused by repeated incomplete emptying of the bladder during urination. What increases the risk? This condition is more likely to develop if:  You ignore your need to urinate or hold urine for long periods of time.  You do not empty your bladder completely during urination.  You wipe back to front after urinating or having a bowel movement, if you are female.  You are uncircumcised, if you are female.  You are constipated.  You have a urinary catheter that stays in place (indwelling).  You have a weak defense (immune) system.  You have a medical condition that affects your bowels, kidneys, or bladder.  You have diabetes.  You take antibiotic medicines frequently or for long periods of time, and the antibiotics no longer work well against certain types of infections (antibiotic resistance).  You take medicines that irritate your urinary tract.  You are exposed to chemicals that irritate your urinary tract.  You are female.  What are the signs or symptoms? Symptoms of this condition include:  Fever.  Frequent urination or passing small amounts of urine frequently.  Needing to urinate urgently.  Pain or burning with urination.  Urine that smells bad or unusual.  Cloudy urine.  Pain in the  lower abdomen or back.  Trouble urinating.  Blood in the urine.  Vomiting or being less hungry than normal.  Diarrhea or abdominal pain.  Vaginal discharge, if you are female.  How is this diagnosed? This condition is diagnosed with a medical history and physical exam. You will also need to provide a urine sample to test your urine. Other tests may be done, including:  Blood tests.  Sexually transmitted disease (STD) testing.  If you have had more than one UTI, a cystoscopy or imaging studies may be done to determine the cause of the infections. How is this treated? Treatment for this condition often includes a combination of two or more of the following:  Antibiotic medicine.  Other medicines to treat less common causes of UTI.  Over-the-counter medicines to treat pain.  Drinking enough water to stay hydrated.  Follow these instructions at home:  Take over-the-counter and prescription medicines only as told by your health care provider.  If you were prescribed an antibiotic, take it as told by your health care provider. Do not stop taking the antibiotic even if you start to feel better.  Avoid alcohol, caffeine, tea, and carbonated beverages. They can irritate your bladder.  Drink enough fluid to keep your urine clear or pale yellow.  Keep all follow-up visits as told by your health care provider. This is important.  Make sure to: ? Empty your bladder often and completely. Do not hold urine for long periods of time. ? Empty your bladder before and after sex. ? Wipe from front to  back after a bowel movement if you are female. Use each tissue one time when you wipe. Contact a health care provider if:  You have back pain.  You have a fever.  You feel nauseous or vomit.  Your symptoms do not get better after 3 days.  Your symptoms go away and then return. Get help right away if:  You have severe back pain or lower abdominal pain.  You are vomiting and cannot  keep down any medicines or water. This information is not intended to replace advice given to you by your health care provider. Make sure you discuss any questions you have with your health care provider. Document Released: 01/20/2005 Document Revised: 09/24/2015 Document Reviewed: 03/03/2015 Elsevier Interactive Patient Education  2017 [employer].

## 2016-10-22 NOTE — Assessment & Plan Note (Signed)
Chronic, deteriorated in office and at home. Increase amlodipine to 10mg  daily, continue losartan 100mg  daily.

## 2016-10-22 NOTE — Progress Notes (Signed)
Pre visit review using our clinic review tool, if applicable. No additional management support is needed unless otherwise documented below in the visit note. 

## 2016-10-25 ENCOUNTER — Other Ambulatory Visit: Payer: Self-pay | Admitting: Family Medicine

## 2016-10-25 LAB — URINE CULTURE

## 2016-11-02 ENCOUNTER — Encounter: Payer: Self-pay | Admitting: Family Medicine

## 2016-11-03 ENCOUNTER — Ambulatory Visit: Payer: Medicare Other | Attending: Family Medicine

## 2016-11-03 DIAGNOSIS — H8112 Benign paroxysmal vertigo, left ear: Secondary | ICD-10-CM | POA: Insufficient documentation

## 2016-11-03 DIAGNOSIS — R2689 Other abnormalities of gait and mobility: Secondary | ICD-10-CM | POA: Diagnosis not present

## 2016-11-03 NOTE — Therapy (Signed)
[facility] [address] [place], [place], [address] Phone: [phone]   Fax:  [phone]  Physical Therapy Treatment  Patient Details  Name: Carrie Lucas MRN: [id number] Date of Birth: 03/09/1941 Referring Provider: Dr. Danise Mina  Encounter Date: 11/03/2016      PT End of Session - 11/03/16 1007    Visit Number 7   Number of Visits 9   Date for PT Re-Evaluation 11/14/16   Authorization Type Medicare: G-CODE AND PROGRESS NOTE EVERY 10TH VISIT   PT Start Time 0937   PT Stop Time 1004  placing pt on hold   PT Time Calculation (min) 27 min   Equipment Utilized During Treatment --  min guard prn   Activity Tolerance Patient tolerated treatment well   Behavior During Therapy [facility] for tasks assessed/performed      Past Medical History:  Diagnosis Date  . Arthritis    R middle finger, chronic arthralgias  . BCC (basal cell carcinoma of skin) 06/2015   supraclavicular, nasal bridge - [name]  . Carotid stenosis, asymptomatic 12/16/2014   Mild bilateral 1-39%, consider rpt [place] 1 yr (11/2014)   . Colon polyp   . DDD (degenerative disc disease), lumbar 2014   severe facet degeneration L4/5, mod spinal setnosis, mild foraminal stenosis  . Headache   . Hepatic steatosis 2012   mild by CT  . History of melanoma 2003   face (Dr Luetta Nutting)  . HLD (hyperlipidemia)   . HTN (hypertension)   . IBS (irritable bowel syndrome)   . Insomnia   . LPRD (laryngopharyngeal reflux disease)    [facility] Baseman)  . Osteopenia 2011   DEXA T -1.6 hip, -1.3 forearm (12/2015)  . Sialadenitis 05/2013   acute [facility])    Past Surgical History:  Procedure Laterality Date  . ABI  06/2010   WNL  . BACK SURGERY  1995   lumbar  . BILATERAL OOPHORECTOMY  2007   ovarian cysts  . BLADDER SUSPENSION    . CARPAL TUNNEL RELEASE Bilateral 2000  . COLONOSCOPY  8 30 12    TAx1, diverticulosis, rpt 5 yrs (Outlaw)  . DEXA  2011   mild osteopenia (T -1.2 R  femur)  . ESI  2014   L L4 transforaminal  . ESOPHAGOGASTRODUODENOSCOPY  8 30 12    WNL  . MOHS SURGERY  2016   BCC  . TONSILLECTOMY  Childhood  . VAGINAL HYSTERECTOMY     bladder drop    There were no vitals filed for this visit.      Subjective Assessment - 11/03/16 0942    Subjective Pt reports MD double BP medication due to high BP. Pt's MD suggests pt should see an ENT to determine cause of prolonged dizziness. Pt denied falls.    Pertinent History HTN, carotid stenosis, hepatic steatosis, DDD Lx spine, osteopenia, OA, CKD stage III, hx of melanoma, HLD   Patient Stated Goals To get rid of all of this.    Currently in Pain? No/denies            [facility] PT Assessment - 11/03/16 0953      Functional Gait  Assessment   Gait assessed  Yes   Gait Level Surface Walks 20 ft in less than 5.5 sec, no assistive devices, good speed, no evidence for imbalance, normal gait pattern, deviates no more than 6 in outside of the 12 in walkway width.   Change in Gait Speed Able to smoothly change walking speed without  loss of balance or gait deviation. Deviate no more than 6 in outside of the 12 in walkway width.   Gait with Horizontal Head Turns Performs head turns smoothly with no change in gait. Deviates no more than 6 in outside 12 in walkway width   Gait with Vertical Head Turns Performs head turns with no change in gait. Deviates no more than 6 in outside 12 in walkway width.   Gait and Pivot Turn Pivot turns safely within 3 sec and stops quickly with no loss of balance.   Step Over Obstacle Is able to step over 2 stacked shoe boxes taped together (9 in total height) without changing gait speed. No evidence of imbalance.   Gait with Narrow Base of Support Is able to ambulate for 10 steps heel to toe with no staggering.   Gait with Eyes Closed Walks 20 ft, uses assistive device, slower speed, mild gait deviations, deviates 6-10 in outside 12 in walkway width. Ambulates 20 ft in less than 9  sec but greater than 7 sec.   Ambulating Backwards Walks 20 ft, no assistive devices, good speed, no evidence for imbalance, normal gait   Steps Alternating feet, no rail.   Total Score 29   FGA comment: 29/30: WNL            Vestibular Assessment - 11/03/16 0944      Dix-Hallpike Left   Dix-Hallpike Left Duration No dizziness   Dix-Hallpike Left Symptoms No nystagmus                 [facility] Adult PT Treatment/Exercise - 11/03/16 0955      Ambulation/Gait   Ambulation/Gait Yes   Ambulation/Gait Assistance 7: Independent   Ambulation/Gait Assistance Details IND while amb. over even/uneven terrain and performing head turns. No LOB. Performed to assess balance during head turns and dizziness. (neuro re-ed).   Ambulation Distance (Feet) 1000 Feet   Assistive device None   Gait Pattern Within Functional Limits   Ambulation Surface Level;Unlevel;Indoor;Outdoor;Paved;Grass                PT Education - 11/03/16 1007    Education provided Yes   Education Details Discussed outcome measures and placing pt on hold to see ENT per PCP. PT encouraged pt to continue to sleep on her R side. PT discussed goal progress.   Person(s) Educated Patient   Methods Explanation   Comprehension Verbalized understanding          PT Short Term Goals - 09/15/16 1532      PT SHORT TERM GOAL #1   Title same as LTGs.           PT Long Term Goals - 11/03/16 1009      PT LONG TERM GOAL #1   Title Pt will report 0/10 dizziness during all positional testing to improve safety during ADLs and improve quality of life. TARGET DATE FOR ALL LTGS: 11/12/16: all unmet goals will be carried over to new POC.   Status Partially Met     PT LONG TERM GOAL #2   Title Pt will decrease falls risk during ambulation as indicated by increase in FGA score of > or = 28/30   Baseline 26/30 on 10/12/16   Status Achieved     PT LONG TERM GOAL #3   Title Pt will improve DHI score to >/=8% to improve  quality of life and reduce impact of dizziness on daily activities.    Status On-going  PT LONG TERM GOAL #4   Title Pt will amb. 1000' while performing head turns, IND, without dizziness and no LOB to improve safety during functional mobility.    Status Achieved     PT LONG TERM GOAL #5   Title Pt will be independent with ongoing HEP as necessary to reduce dizziness and improve balance.   Status On-going               Plan - 11/03/16 1008    Clinical Impression Statement L Dix-hallpike negative for sx's and nystagmus today but pt reported dizziness this morning. PT assessed goals today and placed pt on hold, as MD encouraged pt to see ENT. Pt met LTGs 2 and 4. Pt partially met LTG 1. LTGs 3 and 5 on hold. PT will await ENT recommendation and either d/c or continue PT.   Rehab Potential Excellent   Clinical Impairments Affecting Rehab Potential hx of vertigo   PT Frequency 1x / week   PT Duration 4 weeks   PT Treatment/Interventions ADLs/Self Care Home Management;Biofeedback;Canalith Repostioning;Neuromuscular re-education;Balance training;Therapeutic exercise;Therapeutic activities;Manual techniques;Functional mobility training;Gait training;Stair training;Orthotic Fit/Training;DME Instruction;Patient/family education;Vestibular   PT Next Visit Plan Hold. Assess for L pBPPV and treat as indicated.  Continue to progress HEP.     Consulted and Agree with Plan of Care Patient      Patient will benefit from skilled therapeutic intervention in order to improve the following deficits and impairments:  Abnormal gait, Decreased balance, Dizziness  Visit Diagnosis: BPPV (benign paroxysmal positional vertigo), left  Other abnormalities of gait and mobility     Problem List Patient Active Problem List   Diagnosis Date Noted  . UTI (urinary tract infection) 10/22/2016  . BPPV (benign paroxysmal positional vertigo), left 09/07/2016  . Advanced care planning/counseling discussion  12/16/2014  . Carotid stenosis, asymptomatic 12/16/2014  . Medicare annual wellness visit, subsequent 12/12/2013  . CKD (chronic kidney disease) stage 3, GFR 30-59 ml/min 12/12/2013  . DDD (degenerative disc disease), lumbar   . Hepatic steatosis   . Osteopenia   . HTN (hypertension)   . HLD (hyperlipidemia)   . LPRD (laryngopharyngeal reflux disease)   . Insomnia   . IBS (irritable bowel syndrome)   . Osteoarthritis   . Abdominal bloating 09/09/2013    Jeanette Rauth L 11/03/2016, 10:10 AM  [facility] [address] [place], [place], [address] Phone: [phone]   Fax:  [phone]  Name: Carrie Lucas MRN: [id number] Date of Birth: Aug 31, 1940  Geoffry Paradise, PT,DPT 11/03/16 10:11 AM Phone: [phone] Fax: [phone]

## 2016-11-04 MED ORDER — AMLODIPINE BESYLATE 5 MG PO TABS: 5.0000 mg | ORAL_TABLET | Freq: Every day | ORAL | 1 refills | Status: DC

## 2016-11-04 MED ORDER — HYDROCHLOROTHIAZIDE 12.5 MG PO CAPS: 12.5000 mg | ORAL_CAPSULE | Freq: Every day | ORAL | 6 refills | Status: DC

## 2016-12-19 ENCOUNTER — Other Ambulatory Visit: Payer: Self-pay | Admitting: Family Medicine

## 2016-12-19 DIAGNOSIS — N183 Chronic kidney disease, stage 3 unspecified: Secondary | ICD-10-CM

## 2016-12-19 DIAGNOSIS — E782 Mixed hyperlipidemia: Secondary | ICD-10-CM

## 2016-12-20 ENCOUNTER — Ambulatory Visit (INDEPENDENT_AMBULATORY_CARE_PROVIDER_SITE_OTHER): Payer: Medicare Other

## 2016-12-20 VITALS — BP 110/70 | HR 65 | Temp 98.6°F | Ht 61.5 in | Wt 157.5 lb

## 2016-12-20 DIAGNOSIS — N183 Chronic kidney disease, stage 3 unspecified: Secondary | ICD-10-CM

## 2016-12-20 DIAGNOSIS — Z0000 Encounter for general adult medical examination without abnormal findings: Secondary | ICD-10-CM

## 2016-12-20 DIAGNOSIS — E782 Mixed hyperlipidemia: Secondary | ICD-10-CM | POA: Diagnosis not present

## 2016-12-20 LAB — RENAL FUNCTION PANEL
ALBUMIN: 4.2 g/dL (ref 3.5–5.2)
BUN: 17 mg/dL (ref 6–23)
CALCIUM: 9.9 mg/dL (ref 8.4–10.5)
CHLORIDE: 106 meq/L (ref 96–112)
CO2: 27 meq/L (ref 19–32)
Creatinine, Ser: 1.22 mg/dL — ABNORMAL HIGH (ref 0.40–1.20)
GFR: 45.49 mL/min — ABNORMAL LOW (ref >60.00)
Glucose, Bld: 95 mg/dL (ref 70–99)
POTASSIUM: 4.2 meq/L (ref 3.5–5.1)
Phosphorus: 3.2 mg/dL (ref 2.3–4.6)
SODIUM: 142 meq/L (ref 135–145)

## 2016-12-20 LAB — LIPID PANEL
CHOL/HDL RATIO: 3
Cholesterol: 116 mg/dL (ref 0–200)
HDL: 34 mg/dL — AB (ref >39.00)
LDL CALC: 52 mg/dL (ref 0–99)
NONHDL: 82.33
Triglycerides: 153 mg/dL — ABNORMAL HIGH (ref 0.0–149.0)
VLDL: 30.6 mg/dL (ref 0.0–40.0)

## 2016-12-20 LAB — CBC WITH DIFFERENTIAL/PLATELET
BASOS PCT: 1.1 % (ref 0.0–3.0)
Basophils Absolute: 0.1 10*3/uL (ref 0.0–0.1)
EOS PCT: 2 % (ref 0.0–5.0)
Eosinophils Absolute: 0.1 10*3/uL (ref 0.0–0.7)
HCT: 36.9 % (ref 36.0–46.0)
Hemoglobin: 12.4 g/dL (ref 12.0–15.0)
LYMPHS ABS: 3.3 10*3/uL (ref 0.7–4.0)
Lymphocytes Relative: 48.1 % — ABNORMAL HIGH (ref 12.0–46.0)
MCHC: 33.5 g/dL (ref 30.0–36.0)
MCV: 92.8 fl (ref 78.0–100.0)
MONO ABS: 0.5 10*3/uL (ref 0.1–1.0)
MONOS PCT: 7.4 % (ref 3.0–12.0)
NEUTROS ABS: 2.8 10*3/uL (ref 1.4–7.7)
NEUTROS PCT: 41.4 % — AB (ref 43.0–77.0)
Platelets: 321 10*3/uL (ref 150.0–400.0)
RBC: 3.98 Mil/uL (ref 3.87–5.11)
RDW: 13.4 % (ref 11.5–15.5)
WBC: 6.8 10*3/uL (ref 4.0–10.5)

## 2016-12-20 LAB — TSH: TSH: 1.23 u[IU]/mL (ref 0.35–4.50)

## 2016-12-20 LAB — VITAMIN D 25 HYDROXY (VIT D DEFICIENCY, FRACTURES): VITD: 65.38 ng/mL (ref 30.00–100.00)

## 2016-12-20 NOTE — Progress Notes (Signed)
PCP notes:   Health maintenance:  Flu vaccine - addressed  Abnormal screenings:   Hearing - failed Depression score: 4  Patient concerns:   A. Sleep management - Patient reports concerns falling and staying asleep. Patient states she is often fatigued.  B. Bloating - Patient reports she often feels bloated and experiences intermittent pressure in lower abdomen.   Nurse concerns:  None  Next PCP appt:   12/23/16 @ 1030

## 2016-12-20 NOTE — Progress Notes (Signed)
Subjective:   Carrie Lucas is a 76 y.o. female who presents for Medicare Annual (Subsequent) preventive examination.  Review of Systems:  N/A Cardiac Risk Factors include: advanced age (>58men, >77 women);dyslipidemia;hypertension     Objective:     Vitals: BP 110/70 (BP Location: Right Arm, Patient Position: Sitting, Cuff Size: Normal)   Pulse 65   Temp 98.6 F (37 C) (Oral)   Ht 5' 1.5" (1.562 m) Comment: no shoes  Wt 157 lb 8 oz (71.4 kg)   SpO2 94%   BMI 29.28 kg/m   Body mass index is 29.28 kg/m.   Tobacco History  Smoking Status  . Never Smoker  Smokeless Tobacco  . Never Used     Counseling given: No   Past Medical History:  Diagnosis Date  . Arthritis    R middle finger, chronic arthralgias  . BCC (basal cell carcinoma of skin) 06/2015   supraclavicular, nasal bridge - [name]  . Carotid stenosis, asymptomatic 12/16/2014   Mild bilateral 1-39%, consider rpt [place] 1 yr (11/2014)   . Colon polyp   . DDD (degenerative disc disease), lumbar 2014   severe facet degeneration L4/5, mod spinal setnosis, mild foraminal stenosis  . Headache   . Hepatic steatosis 2012   mild by CT  . History of melanoma 2003   face (Dr Luetta Nutting)  . HLD (hyperlipidemia)   . HTN (hypertension)   . IBS (irritable bowel syndrome)   . Insomnia   . LPRD (laryngopharyngeal reflux disease)    [facility] Bas[facility])  . Osteopenia 2011   DEXA T -1.6 hip, -1.3 forearm (12/2015)  . Sialadenitis 05/2013   acute [facility])   Past Surgical History:  Procedure Laterality Date  . ABI  06/2010   WNL  . BACK SURGERY  1995   lumbar  . BILATERAL OOPHORECTOMY  2007   ovarian cysts  . BLADDER SUSPENSION    . CARPAL TUNNEL RELEASE Bilateral 2000  . COLONOSCOPY  8 30 12    TAx1, diverticulosis, rpt 5 yrs (Outlaw)  . DEXA  2011   mild osteopenia (T -1.2 R femur)  . ESI  2014   L L4 transforaminal  . ESOPHAGOGASTRODUODENOSCOPY  8 30 12    WNL  . MOHS SURGERY  2016   BCC  . TONSILLECTOMY  Childhood  .  VAGINAL HYSTERECTOMY     bladder drop   Family History  Problem Relation Age of Onset  . Cancer Sister        breast  . Pulmonary fibrosis Mother 75  . Diabetes Father   . Hypertension Father   . Stroke Father 9  . Hyperlipidemia Father   . CAD Sister   . Cancer Sister        breast  . Stroke Sister    History  Sexual Activity  . Sexual activity: Yes    Outpatient Encounter Prescriptions as of 12/20/2016  Medication Sig  . acetaminophen (TYLENOL) 650 MG CR tablet Take 650 mg by mouth 2 (two) times daily.  Marland Kitchen amLODipine (NORVASC) 5 MG tablet Take 1 tablet (5 mg total) by mouth daily.  Marland Kitchen aspirin EC 81 MG tablet Take 1 tablet (81 mg total) by mouth every Monday, Wednesday, and Friday.  Marland Kitchen atorvastatin (LIPITOR) 20 MG tablet TAKE ONE TABLET BY MOUTH ONCE DAILY  . calcium carbonate (OS-CAL) 600 MG TABS tablet Take 1,200 mg by mouth daily with breakfast.  . Cholecalciferol (VITAMIN D) 2000 units CAPS Take 1 capsule by mouth daily.  . hydrochlorothiazide (  MICROZIDE) 12.5 MG capsule Take 1 capsule (12.5 mg total) by mouth daily.  Marland Kitchen losartan (COZAAR) 100 MG tablet TAKE ONE TABLET BY MOUTH ONCE DAILY  . Multiple Vitamin (MULTIVITAMIN) tablet Take 1 tablet by mouth daily.  . phenazopyridine (PYRIDIUM) 200 MG tablet Take 1 tablet (200 mg total) by mouth 3 (three) times daily.  . [DISCONTINUED] cephALEXin (KEFLEX) 500 MG capsule Take 1 capsule (500 mg total) by mouth 2 (two) times daily.  . [DISCONTINUED] Cholecalciferol (VITAMIN D) 2000 UNITS CAPS Take 1 capsule by mouth daily.   No facility-administered encounter medications on file as of 12/20/2016.     Activities of Daily Living In your present state of health, do you have any difficulty performing the following activities: 12/20/2016  Hearing? Y  Comment low tones and soft, whispered voices  Vision? N  Difficulty concentrating or making decisions? N  Walking or climbing stairs? N  Dressing or bathing? N  Doing errands, shopping? N   Preparing Food and eating ? N  Using the Toilet? N  In the past six months, have you accidently leaked urine? N  Do you have problems with loss of bowel control? N  Managing your Medications? N  Managing your Finances? N  Housekeeping or managing your Housekeeping? N  Some recent data might be hidden    Patient Care Team: Ria Bush, MD as PCP - General (Family Medicine) Luberta Mutter, MD as Consulting Physician (Ophthalmology) Meisinger, Sherren Mocha, MD as Consulting Physician (Obstetrics and Gynecology) Harriett Sine, MD as Consulting Physician (Dermatology)    Assessment:     Hearing Screening   125Hz  250Hz  500Hz  1000Hz  2000Hz  3000Hz  4000Hz  6000Hz  8000Hz   Right ear:   40 40 40  0    Left ear:   40 40 0  0    Vision Screening Comments: Last vision exam in October 2017   Exercise Activities and Dietary recommendations Current Exercise Habits: Home exercise routine, Type of exercise: walking, Time (Minutes): 30, Frequency (Times/Week): 7, Weekly Exercise (Minutes/Week): 210, Intensity: Mild, Exercise limited by: None identified  Goals    . Increase physical activity          Starting 12/20/2016, I will continue to walk for 30 min daily.       Fall Risk Fall Risk  12/20/2016 12/18/2015 12/16/2014 12/12/2013  Falls in the past year? No Yes No No  Comment - hx of accidental fall with injury (sprained wrist) - -  Number falls in past yr: - 1 - -  Injury with Fall? - Yes - -  Risk for fall due to : - History of fall(s);Impaired balance/gait - -  Risk for fall due to: Comment - pt reports at times her knee "buckles" without warning - -  Follow up - Falls evaluation completed - -   Depression Screen PHQ 2/9 Scores 12/20/2016 12/18/2015 12/18/2015 12/16/2014  PHQ - 2 Score 0 0 0 0  PHQ- 9 Score 4 0 - -     Cognitive Function MMSE - Mini Mental State Exam 12/20/2016 12/18/2015  Orientation to time 5 5  Orientation to Place 5 5  Registration 3 3  Attention/ Calculation 0  0  Recall 3 3  Language- name 2 objects 0 0  Language- repeat 1 1  Language- follow 3 step command 3 3  Language- read & follow direction 0 0  Write a sentence 0 0  Copy design 0 0  Total score 20 20       PLEASE NOTE: A  Mini-Cog screen was completed. Maximum score is 20. A value of 0 denotes this part of Folstein MMSE was not completed or the patient failed this part of the Mini-Cog screening.   Mini-Cog Screening Orientation to Time - Max 5 pts Orientation to Place - Max 5 pts Registration - Max 3 pts Recall - Max 3 pts Language Repeat - Max 1 pts Language Follow 3 Step Command - Max 3 pts   Immunization History  Administered Date(s) Administered  . Influenza Whole 02/05/2010  . Influenza,inj,Quad PF,6+ Mos 12/12/2013, 05/21/2015, 03/12/2016  . Pneumococcal Conjugate-13 12/12/2013  . Pneumococcal Polysaccharide-23 08/24/2005  . Td 02/18/2001  . Tdap 05/10/2013  . Zoster 09/11/2013   Screening Tests Health Maintenance  Topic Date Due  . INFLUENZA VACCINE  07/24/2017 (Originally 11/24/2016)  . DTaP/Tdap/Td (2 - Td) 05/11/2023  . TETANUS/TDAP  05/11/2023  . DEXA SCAN  Completed  . PNA vac Low Risk Adult  Completed      Plan:     I have personally reviewed and addressed the Medicare Annual Wellness questionnaire and have noted the following in the patient's chart:  A. Medical and social history B. Use of alcohol, tobacco or illicit drugs  C. Current medications and supplements D. Functional ability and status E.  Nutritional status F.  Physical activity G. Advance directives H. List of other physicians I.  Hospitalizations, surgeries, and ER visits in previous 12 months J.  [place] to include hearing, vision, cognitive, depression L. Referrals and appointments - none  In addition, I have reviewed and discussed with patient certain preventive protocols, quality metrics, and best practice recommendations. A written personalized care plan for preventive  services as well as general preventive health recommendations were provided to patient.  See attached scanned questionnaire for additional information.   Signed,   Lindell Noe, MHA, BS, LPN Health Coach

## 2016-12-20 NOTE — Progress Notes (Signed)
Pre visit review using our clinic review tool, if applicable. No additional management support is needed unless otherwise documented below in the visit note. 

## 2016-12-20 NOTE — Patient Instructions (Signed)
Carrie Lucas , Thank you for taking time to come for your Medicare Wellness Visit. I appreciate your ongoing commitment to your health goals. Please review the following plan we discussed and let me know if I can assist you in the future.   These are the goals we discussed: Goals    . Increase physical activity          Starting 12/20/2016, I will continue to walk for 30 min daily.        This is a list of the screening recommended for you and due dates:  Health Maintenance  Topic Date Due  . Flu Shot  07/24/2017*  . DTaP/Tdap/Td vaccine (2 - Td) 05/11/2023  . Tetanus Vaccine  05/11/2023  . DEXA scan (bone density measurement)  Completed  . Pneumonia vaccines  Completed  *Topic was postponed. The date shown is not the original due date.   Preventive Care for Adults  A healthy lifestyle and preventive care can promote health and wellness. Preventive health guidelines for adults include the following key practices.  . A routine yearly physical is a good way to check with your health care provider about your health and preventive screening. It is a chance to share any concerns and updates on your health and to receive a thorough exam.  . Visit your dentist for a routine exam and preventive care every 6 months. Brush your teeth twice a day and floss once a day. Good oral hygiene prevents tooth decay and gum disease.  . The frequency of eye exams is based on your age, health, family medical history, use  of contact lenses, and other factors. Follow your health care provider's ecommendations for frequency of eye exams.  . Eat a healthy diet. Foods like vegetables, fruits, whole grains, low-fat dairy products, and lean protein foods contain the nutrients you need without too many calories. Decrease your intake of foods high in solid fats, added sugars, and salt. Eat the right amount of calories for you. Get information about a proper diet from your health care provider, if necessary.  .  Regular physical exercise is one of the most important things you can do for your health. Most adults should get at least 150 minutes of moderate-intensity exercise (any activity that increases your heart rate and causes you to sweat) each week. In addition, most adults need muscle-strengthening exercises on 2 or more days a week.  Silver Sneakers may be a benefit available to you. To determine eligibility, you may visit the website: www.silversneakers.com or contact program at 4341025633 Mon-Fri between 8AM-8PM.   . Maintain a healthy weight. The body mass index (BMI) is a screening tool to identify possible weight problems. It provides an estimate of body fat based on height and weight. Your health care provider can find your BMI and can help you achieve or maintain a healthy weight.   For adults 20 years and older: ? A BMI below 18.5 is considered underweight. ? A BMI of 18.5 to 24.9 is normal. ? A BMI of 25 to 29.9 is considered overweight. ? A BMI of 30 and above is considered obese.   . Maintain normal blood lipids and cholesterol levels by exercising and minimizing your intake of saturated fat. Eat a balanced diet with plenty of fruit and vegetables. Blood tests for lipids and cholesterol should begin at age 55 and be repeated every 5 years. If your lipid or cholesterol levels are high, you are over 50, or you are at high  risk for heart disease, you may need your cholesterol levels checked more frequently. Ongoing high lipid and cholesterol levels should be treated with medicines if diet and exercise are not working.  . If you smoke, find out from your health care provider how to quit. If you do not use tobacco, please do not start.  . If you choose to drink alcohol, please do not consume more than 2 drinks per day. One drink is considered to be 12 ounces (355 mL) of beer, 5 ounces (148 mL) of wine, or 1.5 ounces (44 mL) of liquor.  . If you are 7-63 years old, ask your health care  provider if you should take aspirin to prevent strokes.  . Use sunscreen. Apply sunscreen liberally and repeatedly throughout the day. You should seek shade when your shadow is shorter than you. Protect yourself by wearing long sleeves, pants, a wide-brimmed hat, and sunglasses year round, whenever you are outdoors.  . Once a month, do a whole body skin exam, using a mirror to look at the skin on your back. Tell your health care provider of new moles, moles that have irregular borders, moles that are larger than a pencil eraser, or moles that have changed in shape or color.

## 2016-12-23 ENCOUNTER — Ambulatory Visit (INDEPENDENT_AMBULATORY_CARE_PROVIDER_SITE_OTHER): Payer: Medicare Other | Admitting: Family Medicine

## 2016-12-23 ENCOUNTER — Encounter: Payer: Self-pay | Admitting: Family Medicine

## 2016-12-23 VITALS — BP 118/66 | HR 77 | Temp 98.1°F | Ht 62.0 in | Wt 159.2 lb

## 2016-12-23 DIAGNOSIS — I10 Essential (primary) hypertension: Secondary | ICD-10-CM | POA: Diagnosis not present

## 2016-12-23 DIAGNOSIS — N183 Chronic kidney disease, stage 3 unspecified: Secondary | ICD-10-CM

## 2016-12-23 DIAGNOSIS — E782 Mixed hyperlipidemia: Secondary | ICD-10-CM

## 2016-12-23 DIAGNOSIS — M8580 Other specified disorders of bone density and structure, unspecified site: Secondary | ICD-10-CM

## 2016-12-23 DIAGNOSIS — R140 Abdominal distension (gaseous): Secondary | ICD-10-CM

## 2016-12-23 DIAGNOSIS — K581 Irritable bowel syndrome with constipation: Secondary | ICD-10-CM | POA: Diagnosis not present

## 2016-12-23 DIAGNOSIS — G4700 Insomnia, unspecified: Secondary | ICD-10-CM

## 2016-12-23 MED ORDER — LOSARTAN POTASSIUM-HCTZ 100-12.5 MG PO TABS: 1.0000 | ORAL_TABLET | Freq: Every day | ORAL | 3 refills | Status: DC

## 2016-12-23 MED ORDER — ATORVASTATIN CALCIUM 20 MG PO TABS: 20.0000 mg | ORAL_TABLET | Freq: Every day | ORAL | 3 refills | Status: DC

## 2016-12-23 MED ORDER — POLYETHYLENE GLYCOL 3350 17 GM/SCOOP PO POWD: 8.0000 g | Freq: Every day | ORAL | 1 refills | Status: DC

## 2016-12-23 NOTE — Assessment & Plan Note (Signed)
Chronic, initiation and maintenance.  Reviewed sleep hygiene and handout provided. Discussed trial melatonin. She already takes OTC sleep aid.

## 2016-12-23 NOTE — Assessment & Plan Note (Signed)
Chronic, stable. Continue lipitor.  

## 2016-12-23 NOTE — Progress Notes (Signed)
I reviewed health advisor's note, was available for consultation, and agree with documentation and plan.  

## 2016-12-23 NOTE — Assessment & Plan Note (Signed)
Reviewed latest DEXA. Suggested increased calcium in diet and decreasing calcium tablet to 1 a day.

## 2016-12-23 NOTE — Progress Notes (Signed)
BP 118/66   Pulse 77   Temp 98.1 F (36.7 C) (Oral)   Ht 5\' 2"  (1.575 m)   Wt 159 lb 4 oz (72.2 kg)   SpO2 95%   BMI 29.13 kg/m    CC: f/u medicare wellness Subjective:    Patient ID: Carrie Lucas, female    DOB: [birth year], 76 y.o.   MRN: [id number]  HPI: TEIGAN MANNER is a 76 y.o. female presenting on 12/23/2016 for Annual Exam (Medicare pt 2); Bloated; and Constipation   BP well controlled - cutting amlodipine in half to 5mg  daily. Would like meds refilled today.   Saw Lesia earlier this week for medicare wellness visit. Note reviewed.    Intermittent bloating/constipation lower abdomen. "I have a gas problem" increased lower abdominal pressure. She does drink benefiber in coffee every morning. Doesn't have regular BM. At times unable to complete empty bowel movement. Needs to wear liner.   Ongoing trouble with sleep - present for years. Trouble falling and staying asleep. She takes equate brand sleep aid. She feels she has good bedtime routine.   Preventative: COLONOSCOPY Date: 11/2010 TAx1, diverticulosis, rpt 5 yrs (Outlaw). Pt states normal colonoscopy 03/2016 with Dr Paulita Fujita, rpt 10 yrs. Well woman with GYN Dr. Orpah Greek, last saw 2017. S/p hysterectomy and oophorectomy. May not return.  Mammogram with 3D Tomo WNL 12/2015. +fmhx. She schedules DEXA - 12/2015 DEXA T -1.6 hip, -1.3 forearm - mild osteopenia  Flu yearly Pneumovax 08/2005. prevnar - 11/2013  Tdap 04/2013  zostavax - 08/2013 shingrix - discussed Advanced directives: does not have set up. Packet provided previously. Would want Fritz Pickerel or daughter to be POA.  Seat belt use discussed.  Sunscreen use discussed. No changing moles on skin. H/o melanoma, sees derm yearly.  Never smoker No alcohol.   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 Interim medical history since our last visit reviewed. Allergies and medications reviewed and updated. Outpatient Medications Prior to Visit  Medication Sig Dispense Refill  . acetaminophen (TYLENOL) 650 MG CR tablet Take 650 mg by mouth 2 (two) times daily.    Marland Kitchen amLODipine (NORVASC) 5 MG tablet Take 1 tablet (5 mg total) by mouth daily. 90 tablet 1  . aspirin EC 81 MG tablet Take 1 tablet (81 mg total) by mouth every Monday, Wednesday, and Friday.    . Cholecalciferol (VITAMIN D) 2000 units CAPS Take 1 capsule by mouth daily.    . Multiple Vitamin (MULTIVITAMIN) tablet Take 1 tablet by mouth daily.    Marland Kitchen atorvastatin (LIPITOR) 20 MG tablet TAKE ONE TABLET BY MOUTH ONCE DAILY 90 tablet 0  . calcium carbonate (OS-CAL) 600 MG TABS tablet Take 1,200 mg by mouth daily with breakfast.    . hydrochlorothiazide (MICROZIDE) 12.5 MG capsule Take 1 capsule (12.5 mg total) by mouth daily. 30 capsule 6  . losartan (COZAAR) 100 MG tablet TAKE ONE TABLET BY MOUTH ONCE DAILY 90 tablet 3  . phenazopyridine (PYRIDIUM) 200 MG tablet Take 1 tablet (200 mg total) by mouth 3 (three) times daily. 6 tablet 0   No facility-administered medications prior to visit.      Per HPI unless specifically indicated in ROS section below Review of Systems  Objective:    BP 118/66   Pulse 77   Temp 98.1 F (36.7 C) (Oral)   Ht 5\' 2"  (1.575 m)   Wt 159 lb 4 oz (72.2 kg)   SpO2 95%   BMI 29.13 kg/m   Wt Readings from Last 3 Encounters:  12/23/16 159 lb 4 oz (72.2 kg)  12/20/16 157 lb 8 oz (71.4 kg)  10/22/16 159 lb (72.1 kg)    Physical Exam  Constitutional: She is oriented to person, place, and time. She appears well-developed and well-nourished. No distress.  HENT:  Head: Normocephalic and atraumatic.  Right Ear: Hearing, tympanic membrane, external ear and ear canal normal.  Left Ear: Hearing,  tympanic membrane, external ear and ear canal normal.  Nose: Nose normal.  Mouth/Throat: Uvula is midline, oropharynx is clear and moist and mucous membranes are normal. No oropharyngeal exudate, posterior oropharyngeal edema or posterior oropharyngeal erythema.  Eyes: Pupils are equal, round, and reactive to light. Conjunctivae and EOM are normal. No scleral icterus.  Neck: Normal range of motion. Neck supple. No thyromegaly present.  Cardiovascular: Normal rate, regular rhythm, normal heart sounds and intact distal pulses.   No murmur heard. Pulses:      Radial pulses are 2+ on the right side, and 2+ on the left side.  Pulmonary/Chest: Effort normal and breath sounds normal. No respiratory distress. She has no wheezes. She has no rales.  Abdominal: Soft. Bowel sounds are normal. She exhibits no distension and no mass. There is no tenderness. There is no rebound and no guarding.  Musculoskeletal: Normal range of motion. She exhibits no edema.  Lymphadenopathy:    She has no cervical adenopathy.  Neurological: She is alert and oriented to person, place, and time.  CN grossly intact, station and gait intact  Skin: Skin is warm and dry. No rash noted.  Psychiatric: She has a normal mood and affect. Her behavior is normal. Judgment and thought content normal.  Nursing note and vitals reviewed.  Results for orders placed or performed in visit on 12/20/16  Lipid panel  Result Value Ref Range   Cholesterol 116 0 - 200 mg/dL   Triglycerides 153.0 (H) 0.0 - 149.0 mg/dL   HDL 34.00 (L) >39.00 mg/dL   VLDL 30.6 0.0 - 40.0 mg/dL   LDL Cholesterol 52 0 - 99 mg/dL   Total CHOL/HDL Ratio 3    NonHDL 82.33   Renal function panel  Result Value Ref Range   Sodium 142 135 - 145 mEq/L   Potassium 4.2 3.5 - 5.1 mEq/L   Chloride 106 96 - 112 mEq/L   CO2 27 19 - 32 mEq/L   Calcium 9.9 8.4 - 10.5 mg/dL   Albumin 4.2 3.5 - 5.2 g/dL   BUN 17 6 - 23 mg/dL   Creatinine, Ser 1.22 (H) 0.40 - 1.20 mg/dL    Glucose, Bld 95 70 - 99 mg/dL   Phosphorus 3.2 2.3 - 4.6 mg/dL   GFR 45.49 (L) >60.00 mL/min  TSH  Result Value Ref Range   TSH 1.23 0.35 - 4.50 uIU/mL  CBC with Differential/Platelet  Result Value Ref Range   WBC 6.8 4.0 - 10.5 K/uL   RBC 3.98 3.87 - 5.11 Mil/uL   Hemoglobin 12.4 12.0 - 15.0 g/dL   HCT 36.9 36.0 - 46.0 %   MCV 92.8 78.0 - 100.0 fl   MCHC 33.5 30.0 - 36.0 g/dL   RDW 13.4 11.5 - 15.5 %   Platelets 321.0 150.0 - 400.0  K/uL   Neutrophils Relative % 41.4 (L) 43.0 - 77.0 %   Lymphocytes Relative 48.1 (H) 12.0 - 46.0 %   Monocytes Relative 7.4 3.0 - 12.0 %   Eosinophils Relative 2.0 0.0 - 5.0 %   Basophils Relative 1.1 0.0 - 3.0 %   Neutro Abs 2.8 1.4 - 7.7 K/uL   Lymphs Abs 3.3 0.7 - 4.0 K/uL   Monocytes Absolute 0.5 0.1 - 1.0 K/uL   Eosinophils Absolute 0.1 0.0 - 0.7 K/uL   Basophils Absolute 0.1 0.0 - 0.1 K/uL  VITAMIN D 25 Hydroxy (Vit-D Deficiency, Fractures)  Result Value Ref Range   VITD 65.38 30.00 - 100.00 ng/mL      Assessment & Plan:   Problem List Items Addressed This Visit    Abdominal bloating - Primary    Longstanding gassiness/bloating thought IBS related.  Discussed stopping fiber supplement, starting miralax 1/2 capful daily, avoiding gas producing foods. H/o low pancreatic elastase but prior trial of enzymes not helpful.  I have requested records of colonoscopy from 03/2016 (outlaw)      CKD (chronic kidney disease) stage 3, GFR 30-59 ml/min    Discussed with patient.  Encouraged staying well hydrated and avoiding NSAIDs.  Consider SPEP, TSH, renal US if not already done.       HLD (hyperlipidemia) (Chronic)    Chronic, stable. Continue lipitor.       Relevant Medications   atorvastatin (LIPITOR) 20 MG tablet   losartan-hydrochlorothiazide (HYZAAR) 100-12.5 MG tablet   HTN (hypertension)    Chronic, stable. Continue current regimen. Will send in combo losartan 100/12.5mg  daily.      Relevant Medications   atorvastatin (LIPITOR)  20 MG tablet   losartan-hydrochlorothiazide (HYZAAR) 100-12.5 MG tablet   IBS (irritable bowel syndrome)    Possible bloating/constipation related to this. rec she start 1/2 capful miralax and hold benefiber for now.       Relevant Medications   polyethylene glycol powder (GLYCOLAX/MIRALAX) powder   Insomnia    Chronic, initiation and maintenance.  Reviewed sleep hygiene and handout provided. Discussed trial melatonin. She already takes OTC sleep aid.       Osteopenia    Reviewed latest DEXA. Suggested increased calcium in diet and decreasing calcium tablet to 1 a day.           Follow up plan: Return in about 1 year (around 12/23/2017) for annual exam, prior fasting for blood work, medicare wellness visit.  Ria Bush, MD

## 2016-12-23 NOTE — Assessment & Plan Note (Signed)
Discussed with patient.  Encouraged staying well hydrated and avoiding NSAIDs.  Consider SPEP, TSH, renal US if not already done.

## 2016-12-23 NOTE — Assessment & Plan Note (Addendum)
Longstanding gassiness/bloating thought IBS related.  Discussed stopping fiber supplement, starting miralax 1/2 capful daily, avoiding gas producing foods. H/o low pancreatic elastase but prior trial of enzymes not helpful.  I have requested records of colonoscopy from 03/2016 (outlaw)

## 2016-12-23 NOTE — Patient Instructions (Addendum)
Sign release for records of colonoscopy (Outlaw) Ok to take gas X Start miralax 1/2 capful daily in 8 oz fluid. Let us know how this helps.  Exclude gas producing foods (beans, onions, celery, carrots, raisins, bananas, apricots, prunes, brussel sprouts, wheat germ, pretzels) If interested, check with pharmacy about new 2 shot shingles series (shingrix).  Decrease calcium to 1 tablet daily.  Return as needed or in 1 year for next wellness visit with Katha Cabal and follow up with me.   Try melatonin for sleep.  Sleep hygiene checklist: 1. Avoid naps during the day 2. Avoid stimulants such as caffeine and nicotine. Avoid bedtime alcohol (it can speed onset of sleep but the body's metabolism can cause awakenings). 3. All forms of exercise help ensure sound sleep - limit vigorous exercise to morning or late afternoon 4. Avoid food too close to bedtime including chocolate (which contains caffeine) 5. Soak up natural light 6. Establish regular bedtime routine. 7. Associate bed with sleep - avoid TV, computer or phone, reading while in bed. 8. Ensure pleasant, relaxing sleep environment - quiet, dark, cool room.

## 2016-12-23 NOTE — Assessment & Plan Note (Signed)
Chronic, stable. Continue current regimen. Will send in combo losartan 100/12.5mg  daily.

## 2016-12-23 NOTE — Assessment & Plan Note (Signed)
Possible bloating/constipation related to this. rec she start 1/2 capful miralax and hold benefiber for now.

## 2017-01-06 ENCOUNTER — Encounter: Payer: Self-pay | Admitting: Family Medicine

## 2017-01-07 MED ORDER — LUBIPROSTONE 8 MCG PO CAPS: 8.0000 ug | ORAL_CAPSULE | Freq: Two times a day (BID) | ORAL | 1 refills | Status: DC

## 2017-01-08 ENCOUNTER — Telehealth: Payer: Self-pay | Admitting: Family Medicine

## 2017-01-08 ENCOUNTER — Encounter: Payer: Self-pay | Admitting: Family Medicine

## 2017-01-08 NOTE — Telephone Encounter (Signed)
We received records this month of colonoscopy from 2012 by [facility] Dr Paulita Fujita. Pt states she had normal colonoscopy 03/2016 with Dr Paulita Fujita - can we call [facility] and verify, and request those records? I only received colonoscopy from 2012.

## 2017-01-11 NOTE — Telephone Encounter (Signed)
Spoke with [place] at [facility], confirms pt did have colonoscopy and is faxing the records.

## 2017-01-13 NOTE — Telephone Encounter (Signed)
Spoke with Jerene Pitch at [facility], says they will fax the records for the 03/2016 colonoscopy today.

## 2017-01-13 NOTE — Telephone Encounter (Signed)
I again received records of colonoscopy from 2012. Pt states she had one 03/2016.  plz confirm with [facility] she had one 2017 and get those records. I already had colonoscopy from 2012.

## 2017-01-15 ENCOUNTER — Encounter: Payer: Self-pay | Admitting: Family Medicine

## 2017-01-20 NOTE — Therapy (Signed)
[facility] [address] [place], [place], [address] Phone: [phone]   Fax:  [phone]  Patient Details  Name: Carrie Lucas MRN: [id number] Date of Birth: 09/21/1940 Referring Provider:  No ref. provider found  Encounter Date: 2017-02-02  PHYSICAL THERAPY DISCHARGE SUMMARY  Visits from Start of Care: 7  Current functional level related to goals / functional outcomes:     PT Long Term Goals - 11/03/16 1009      PT LONG TERM GOAL #1   Title Pt will report 0/10 dizziness during all positional testing to improve safety during ADLs and improve quality of life. TARGET DATE FOR ALL LTGS: 11/12/16: all unmet goals will be carried over to new POC.   Status Partially Met     PT LONG TERM GOAL #2   Title Pt will decrease falls risk during ambulation as indicated by increase in FGA score of > or = 28/30   Baseline 26/30 on 10/12/16   Status Achieved     PT LONG TERM GOAL #3   Title Pt will improve DHI score to >/=8% to improve quality of life and reduce impact of dizziness on daily activities.    Status On-going     PT LONG TERM GOAL #4   Title Pt will amb. 1000' while performing head turns, IND, without dizziness and no LOB to improve safety during functional mobility.    Status Achieved     PT LONG TERM GOAL #5   Title Pt will be independent with ongoing HEP as necessary to reduce dizziness and improve balance.   Status On-going         G-Codes - 02/02/2017 1352    Functional Assessment Tool Used (Outpatient Only) DHI: 26%-same as eval as pt did not return   Functional Limitation Self care   Self Care Goal Status (G2542) At least 1 percent but less than 20 percent impaired, limited or restricted   Self Care Discharge Status [phone]) At least 20 percent but less than 40 percent impaired, limited or restricted        Remaining deficits: Pt placed on hold as sx's improved but were not resolved, pt was going to see  ENT per PCP recommendations. PT placed pt on hold and pt did not return.   Education / Equipment: HEP  Plan: Patient agrees to discharge.  Patient goals were not met. Patient is being discharged due to not returning since the last visit.  ?????       Kayal Mula L 2017/02/02, 1:53 PM  [facility] [address] [place], [place], [address] Phone: [phone]   Fax:  [phone]   Geoffry Paradise, PT,DPT Feb 02, 2017 1:54 PM Phone: [phone] Fax: [phone]

## 2017-02-14 ENCOUNTER — Ambulatory Visit (INDEPENDENT_AMBULATORY_CARE_PROVIDER_SITE_OTHER): Payer: Medicare Other | Admitting: Family Medicine

## 2017-02-14 ENCOUNTER — Encounter: Payer: Self-pay | Admitting: Family Medicine

## 2017-02-14 ENCOUNTER — Other Ambulatory Visit: Payer: Self-pay | Admitting: Family Medicine

## 2017-02-14 VITALS — BP 126/70 | HR 79 | Temp 98.2°F | Wt 157.8 lb

## 2017-02-14 DIAGNOSIS — H8112 Benign paroxysmal vertigo, left ear: Secondary | ICD-10-CM | POA: Diagnosis not present

## 2017-02-14 DIAGNOSIS — R300 Dysuria: Secondary | ICD-10-CM

## 2017-02-14 DIAGNOSIS — Z1231 Encounter for screening mammogram for malignant neoplasm of breast: Secondary | ICD-10-CM

## 2017-02-14 DIAGNOSIS — N3000 Acute cystitis without hematuria: Secondary | ICD-10-CM | POA: Diagnosis not present

## 2017-02-14 LAB — POC URINALSYSI DIPSTICK (AUTOMATED)
BILIRUBIN UA: NEGATIVE
GLUCOSE UA: NEGATIVE
Ketones, UA: NEGATIVE
NITRITE UA: NEGATIVE
PH UA: 5.5 (ref 5.0–8.0)
Spec Grav, UA: >=1.03 — AB (ref 1.010–1.025)
UROBILINOGEN UA: 0.2 U/dL

## 2017-02-14 MED ORDER — CEPHALEXIN 500 MG PO CAPS: 500.0000 mg | ORAL_CAPSULE | Freq: Two times a day (BID) | ORAL | 0 refills | Status: DC

## 2017-02-14 NOTE — Progress Notes (Signed)
BP 126/70 (BP Location: Left Arm, Patient Position: Sitting, Cuff Size: Normal)   Pulse 79   Temp 98.2 F (36.8 C) (Oral)   Wt 157 lb 12 oz (71.6 kg)   SpO2 96%   BMI 28.85 kg/m    CC: UTI Subjective:    Patient ID: Carrie Lucas, female    DOB: 27-Dec-1940, 76 y.o.   MRN: [id number]  HPI: Carrie Lucas is a 76 y.o. female presenting on 02/14/2017 for Urinary Frequency (Started 02/12/17. Took AZO. H/o UTIs) and Dysuria   2d h/o urinary frequency, dysuria, urgency and suprapubic pressure, incomplete emptying.  Denies inciting triggers. She does usually avoid soft drinks, but last week she did have soda.  Took azo. Drinking plenty of water.  Last UTI was several months ago.  3rd UTI this year.  No fevers/chills, nausea, flank pain  Seen at [facility] 09/23/2016 with UTI, treated with cipro 7d course and pyridium. Culture grew >100k Ecoli resistant to cipro, so changed to nitrofurantoin.   Seen 09/2016 - rpt UTI with culture growing E coli resistant to several abx, treated with keflex course.   Relevant past medical, surgical, family and social history reviewed and updated as indicated. Interim medical history since our last visit reviewed. Allergies and medications reviewed and updated. Outpatient Medications Prior to Visit  Medication Sig Dispense Refill  . acetaminophen (TYLENOL) 650 MG CR tablet Take 650 mg by mouth 2 (two) times daily.    Marland Kitchen amLODipine (NORVASC) 5 MG tablet Take 1 tablet (5 mg total) by mouth daily. 90 tablet 1  . aspirin EC 81 MG tablet Take 1 tablet (81 mg total) by mouth every Monday, Wednesday, and Friday.    Marland Kitchen atorvastatin (LIPITOR) 20 MG tablet Take 1 tablet (20 mg total) by mouth daily. 90 tablet 3  . calcium carbonate (OS-CAL) 600 MG TABS tablet Take 1 tablet (600 mg total) by mouth daily with breakfast.    . Cholecalciferol (VITAMIN D) 2000 units CAPS Take 1 capsule by mouth daily.    Marland Kitchen losartan-hydrochlorothiazide (HYZAAR) 100-12.5 MG tablet Take 1 tablet by  mouth daily. 90 tablet 3  . Multiple Vitamin (MULTIVITAMIN) tablet Take 1 tablet by mouth daily.    Marland Kitchen lubiprostone (AMITIZA) 8 MCG capsule Take 1 capsule (8 mcg total) by mouth 2 (two) times daily with a meal. (Patient not taking: Reported on 02/14/2017) 60 capsule 1   No facility-administered medications prior to visit.      Per HPI unless specifically indicated in ROS section below Review of Systems     Objective:    BP 126/70 (BP Location: Left Arm, Patient Position: Sitting, Cuff Size: Normal)   Pulse 79   Temp 98.2 F (36.8 C) (Oral)   Wt 157 lb 12 oz (71.6 kg)   SpO2 96%   BMI 28.85 kg/m   Wt Readings from Last 3 Encounters:  02/14/17 157 lb 12 oz (71.6 kg)  12/23/16 159 lb 4 oz (72.2 kg)  12/20/16 157 lb 8 oz (71.4 kg)    Physical Exam  Constitutional: She appears well-developed and well-nourished. No distress.  Abdominal: Soft. Normal appearance and bowel sounds are normal. She exhibits no distension and no mass. There is no hepatosplenomegaly. There is no tenderness. There is no rigidity, no rebound, no guarding, no CVA tenderness and negative Murphy's sign.  Musculoskeletal: She exhibits no edema.  Nursing note and vitals reviewed.  Results for orders placed or performed in visit on 02/14/17  POCT Urinalysis Dipstick (Automated)  Result Value Ref Range   Color, UA gold    Clarity, UA clear    Glucose, UA negative    Bilirubin, UA negative    Ketones, UA negative    Spec Grav, UA >=1.030 (A) 1.010 - 1.025   Blood, UA 3+    pH, UA 5.5 5.0 - 8.0   Protein, UA 1+    Urobilinogen, UA 0.2 0.2 or 1.0 E.U./dL   Nitrite, UA negative    Leukocytes, UA Small (1+) (A) Negative      Assessment & Plan:   Problem List Items Addressed This Visit    BPPV (benign paroxysmal positional vertigo), left    By the way...ongoing issue, noted with head position (laying completely supine without pillow and again when sitting up). Did not improve with vestibular rehab. Has  meclizine but doesn't want to regularly depend on this. Requests return to ENT.       Relevant Orders   Ambulatory referral to ENT   UTI (urinary tract infection) - Primary    3rd UTI in last year. Rx keflex 7d course. UCx sent. Start cranberry tablet preventatively. If recurrence, consider daily abx ppx vs uro referral       Relevant Medications   cephALEXin (KEFLEX) 500 MG capsule   Other Relevant Orders   Urine Culture    Other Visit Diagnoses    Dysuria       Relevant Orders   POCT Urinalysis Dipstick (Automated) (Completed)       Follow up plan: Return if symptoms worsen or fail to improve.  Ria Bush, MD

## 2017-02-14 NOTE — Patient Instructions (Addendum)
Repeat UTI - treat with keflex 500mg  twice daily for 7 days.  Urine culture sent today.  Start cranberry tablets preventatively.  Let us know if ongoing trouble.

## 2017-02-14 NOTE — Assessment & Plan Note (Signed)
By the way...ongoing issue, noted with head position (laying completely supine without pillow and again when sitting up). Did not improve with vestibular rehab. Has meclizine but doesn't want to regularly depend on this. Requests return to ENT.

## 2017-02-14 NOTE — Assessment & Plan Note (Addendum)
3rd UTI in last year. Rx keflex 7d course. UCx sent. Start cranberry tablet preventatively. If recurrence, consider daily abx ppx vs uro referral

## 2017-02-17 ENCOUNTER — Ambulatory Visit (INDEPENDENT_AMBULATORY_CARE_PROVIDER_SITE_OTHER): Payer: Medicare Other

## 2017-02-17 DIAGNOSIS — Z23 Encounter for immunization: Secondary | ICD-10-CM | POA: Diagnosis not present

## 2017-02-17 LAB — URINE CULTURE
MICRO NUMBER: 81178188
SPECIMEN QUALITY:: ADEQUATE

## 2017-02-23 DIAGNOSIS — H532 Diplopia: Secondary | ICD-10-CM | POA: Diagnosis not present

## 2017-02-23 DIAGNOSIS — H52202 Unspecified astigmatism, left eye: Secondary | ICD-10-CM | POA: Diagnosis not present

## 2017-02-23 DIAGNOSIS — H1789 Other corneal scars and opacities: Secondary | ICD-10-CM | POA: Diagnosis not present

## 2017-03-01 DIAGNOSIS — H8112 Benign paroxysmal vertigo, left ear: Secondary | ICD-10-CM | POA: Diagnosis not present

## 2017-03-01 DIAGNOSIS — R42 Dizziness and giddiness: Secondary | ICD-10-CM | POA: Diagnosis not present

## 2017-03-08 ENCOUNTER — Ambulatory Visit: Payer: Medicare Other

## 2017-03-28 DIAGNOSIS — H8112 Benign paroxysmal vertigo, left ear: Secondary | ICD-10-CM | POA: Diagnosis not present

## 2017-04-06 ENCOUNTER — Ambulatory Visit
Admission: RE | Admit: 2017-04-06 | Discharge: 2017-04-06 | Disposition: A | Discharge status: Home / Self Care | Payer: Medicare Other | Source: Ambulatory Visit | Attending: Family Medicine | Admitting: Family Medicine

## 2017-04-06 DIAGNOSIS — Z1231 Encounter for screening mammogram for malignant neoplasm of breast: Secondary | ICD-10-CM

## 2017-04-07 ENCOUNTER — Other Ambulatory Visit: Payer: Self-pay | Admitting: Family Medicine

## 2017-04-07 DIAGNOSIS — R928 Other abnormal and inconclusive findings on diagnostic imaging of breast: Secondary | ICD-10-CM

## 2017-04-12 ENCOUNTER — Ambulatory Visit
Admission: RE | Admit: 2017-04-12 | Discharge: 2017-04-12 | Disposition: A | Discharge status: Home / Self Care | Payer: Medicare Other | Source: Ambulatory Visit | Attending: Family Medicine | Admitting: Family Medicine

## 2017-04-12 DIAGNOSIS — R928 Other abnormal and inconclusive findings on diagnostic imaging of breast: Secondary | ICD-10-CM

## 2017-04-12 DIAGNOSIS — R922 Inconclusive mammogram: Secondary | ICD-10-CM | POA: Diagnosis not present

## 2017-04-12 DIAGNOSIS — N6489 Other specified disorders of breast: Secondary | ICD-10-CM | POA: Diagnosis not present

## 2017-05-30 ENCOUNTER — Encounter: Payer: Self-pay | Admitting: Family Medicine

## 2017-05-30 ENCOUNTER — Ambulatory Visit (INDEPENDENT_AMBULATORY_CARE_PROVIDER_SITE_OTHER): Payer: Medicare Other | Admitting: Family Medicine

## 2017-05-30 VITALS — BP 118/72 | HR 67 | Temp 99.0°F | Wt 157.0 lb

## 2017-05-30 DIAGNOSIS — J22 Unspecified acute lower respiratory infection: Secondary | ICD-10-CM | POA: Diagnosis not present

## 2017-05-30 NOTE — Assessment & Plan Note (Signed)
Anticipate viral given short duration and benign exam. Supportive care reviewed. Red flags to update [place] for further eval/treatment reviewed. Pt agrees with plan.

## 2017-05-30 NOTE — Progress Notes (Signed)
BP 118/72 (BP Location: Left Arm, Patient Position: Sitting, Cuff Size: Normal)   Pulse 67   Temp 99 F (37.2 C) (Oral)   Wt 157 lb (71.2 kg)   SpO2 96%   BMI 28.72 kg/m    CC: cough, fatigue Subjective:    Patient ID: Carrie Lucas, female    DOB: 1940-10-28, 77 y.o.   MRN: [id number]  HPI: Carrie Lucas is a 77 y.o. female presenting on 05/30/2017 for Cough (Productive cough. Started 05/22/17. Tried Delsym and Tylenonl.) and Fatigue   1+ wk h/o ST, progressively worsening cough productive of green/yellow mucous. Marked fatigue and malaise. Mild diarrhea last week. Feels dizzy/lightheaded  No fevers/chills, ear or tooth pain, PNdrainage, head congestion. No dyspnea or wheeze.   Has tried tylenol, delsym. No sick contacts at home. No h/o asthma. Non smoker.  Relevant past medical, surgical, family and social history reviewed and updated as indicated. Interim medical history since our last visit reviewed. Allergies and medications reviewed and updated. Outpatient Medications Prior to Visit  Medication Sig Dispense Refill  . acetaminophen (TYLENOL) 650 MG CR tablet Take 650 mg by mouth 2 (two) times daily.    Marland Kitchen amLODipine (NORVASC) 5 MG tablet Take 1 tablet (5 mg total) by mouth daily. 90 tablet 1  . aspirin EC 81 MG tablet Take 1 tablet (81 mg total) by mouth every Monday, Wednesday, and Friday.    Marland Kitchen atorvastatin (LIPITOR) 20 MG tablet Take 1 tablet (20 mg total) by mouth daily. 90 tablet 3  . calcium carbonate (OS-CAL) 600 MG TABS tablet Take 1 tablet (600 mg total) by mouth daily with breakfast.    . Cholecalciferol (VITAMIN D) 2000 units CAPS Take 1 capsule by mouth daily.    Marland Kitchen losartan-hydrochlorothiazide (HYZAAR) 100-12.5 MG tablet Take 1 tablet by mouth daily. 90 tablet 3  . Multiple Vitamin (MULTIVITAMIN) tablet Take 1 tablet by mouth daily.    . cephALEXin (KEFLEX) 500 MG capsule Take 1 capsule (500 mg total) by mouth 2 (two) times daily. 14 capsule 0  . lubiprostone  (AMITIZA) 8 MCG capsule Take 1 capsule (8 mcg total) by mouth 2 (two) times daily with a meal. (Patient not taking: Reported on 02/14/2017) 60 capsule 1   No facility-administered medications prior to visit.      Per HPI unless specifically indicated in ROS section below Review of Systems     Objective:    BP 118/72 (BP Location: Left Arm, Patient Position: Sitting, Cuff Size: Normal)   Pulse 67   Temp 99 F (37.2 C) (Oral)   Wt 157 lb (71.2 kg)   SpO2 96%   BMI 28.72 kg/m   Wt Readings from Last 3 Encounters:  05/30/17 157 lb (71.2 kg)  02/14/17 157 lb 12 oz (71.6 kg)  12/23/16 159 lb 4 oz (72.2 kg)    Physical Exam  Constitutional: She appears well-developed and well-nourished. No distress.  HENT:  Head: Normocephalic and atraumatic.  Right Ear: Hearing, tympanic membrane, external ear and ear canal normal.  Left Ear: Hearing, tympanic membrane, external ear and ear canal normal.  Nose: Mucosal edema (nasal mucosal congestion) present. No rhinorrhea. Right sinus exhibits no maxillary sinus tenderness and no frontal sinus tenderness. Left sinus exhibits no maxillary sinus tenderness and no frontal sinus tenderness.  Mouth/Throat: Uvula is midline, oropharynx is clear and moist and mucous membranes are normal. No oropharyngeal exudate, posterior oropharyngeal edema, posterior oropharyngeal erythema or tonsillar abscesses.  Eyes: Conjunctivae and EOM are  normal. Pupils are equal, round, and reactive to light. No scleral icterus.  Neck: Normal range of motion. Neck supple.  Cardiovascular: Normal rate, regular rhythm, normal heart sounds and intact distal pulses.  No murmur heard. Pulmonary/Chest: Effort normal and breath sounds normal. No respiratory distress. She has no wheezes. She has no rales.  Lymphadenopathy:    She has no cervical adenopathy.  Skin: Skin is warm and dry. No rash noted.  Nursing note and vitals reviewed.     Assessment & Plan:   Problem List Items  Addressed This Visit    Acute respiratory infection - Primary    Anticipate viral given short duration and benign exam. Supportive care reviewed. Red flags to update [place] for further eval/treatment reviewed. Pt agrees with plan.           Follow up plan: Return if symptoms worsen or fail to improve.  Ria Bush, MD

## 2017-05-30 NOTE — Patient Instructions (Addendum)
You have a viral bronchitis.  Push fluids and plenty of rest. Continue delsym to help control cough.  Continue tylenol for aches.  Nasal saline irrigation or neti pot to help drain sinuses. May use plain mucinex with plenty of fluid to help mobilize mucous. Please let us know if fever >101.5, trouble opening/closing mouth, difficulty swallowing, or worsening productive cough instead of improving as expected.

## 2017-07-02 DIAGNOSIS — R300 Dysuria: Secondary | ICD-10-CM | POA: Diagnosis not present

## 2017-07-02 DIAGNOSIS — N390 Urinary tract infection, site not specified: Secondary | ICD-10-CM | POA: Diagnosis not present

## 2017-07-03 ENCOUNTER — Encounter: Payer: Self-pay | Admitting: Family Medicine

## 2017-07-06 ENCOUNTER — Encounter: Payer: Self-pay | Admitting: Family Medicine

## 2017-07-06 DIAGNOSIS — D225 Melanocytic nevi of trunk: Secondary | ICD-10-CM | POA: Diagnosis not present

## 2017-07-06 DIAGNOSIS — Z85828 Personal history of other malignant neoplasm of skin: Secondary | ICD-10-CM | POA: Diagnosis not present

## 2017-07-06 DIAGNOSIS — D1801 Hemangioma of skin and subcutaneous tissue: Secondary | ICD-10-CM | POA: Diagnosis not present

## 2017-07-06 DIAGNOSIS — C44519 Basal cell carcinoma of skin of other part of trunk: Secondary | ICD-10-CM | POA: Diagnosis not present

## 2017-07-06 DIAGNOSIS — L821 Other seborrheic keratosis: Secondary | ICD-10-CM | POA: Diagnosis not present

## 2017-07-06 DIAGNOSIS — D2262 Melanocytic nevi of left upper limb, including shoulder: Secondary | ICD-10-CM | POA: Diagnosis not present

## 2017-07-08 ENCOUNTER — Telehealth: Payer: Self-pay

## 2017-07-08 DIAGNOSIS — N390 Urinary tract infection, site not specified: Secondary | ICD-10-CM

## 2017-07-08 DIAGNOSIS — N3001 Acute cystitis with hematuria: Secondary | ICD-10-CM

## 2017-07-08 NOTE — Telephone Encounter (Signed)
Appt made and patient aware.  

## 2017-07-08 NOTE — Telephone Encounter (Signed)
Urology referral placed. See pt email 3/10.

## 2017-07-08 NOTE — Telephone Encounter (Signed)
Copied from [place] [id number]. Topic: Referral - Request >> Jul 08, 2017 10:32 AM Antonieta Iba C wrote: Reason for CRM: pt called in to request a referral to an urologist. Please assist further

## 2017-07-12 ENCOUNTER — Encounter: Payer: Self-pay | Admitting: Family Medicine

## 2017-07-17 ENCOUNTER — Other Ambulatory Visit: Payer: Self-pay | Admitting: Family Medicine

## 2017-07-17 DIAGNOSIS — N183 Chronic kidney disease, stage 3 unspecified: Secondary | ICD-10-CM

## 2017-07-17 DIAGNOSIS — N3001 Acute cystitis with hematuria: Secondary | ICD-10-CM

## 2017-07-17 DIAGNOSIS — E782 Mixed hyperlipidemia: Secondary | ICD-10-CM

## 2017-07-17 DIAGNOSIS — K760 Fatty (change of) liver, not elsewhere classified: Secondary | ICD-10-CM

## 2017-07-22 ENCOUNTER — Other Ambulatory Visit (INDEPENDENT_AMBULATORY_CARE_PROVIDER_SITE_OTHER): Payer: Medicare Other

## 2017-07-22 DIAGNOSIS — N3001 Acute cystitis with hematuria: Secondary | ICD-10-CM | POA: Diagnosis not present

## 2017-07-22 LAB — URINALYSIS, ROUTINE W REFLEX MICROSCOPIC
BILIRUBIN URINE: NEGATIVE
Hgb urine dipstick: NEGATIVE
Ketones, ur: NEGATIVE
Nitrite: NEGATIVE
PH: 5.5 (ref 5.0–8.0)
RBC / HPF: NONE SEEN (ref 0–?)
Specific Gravity, Urine: 1.025 (ref 1.000–1.030)
Total Protein, Urine: NEGATIVE
Urine Glucose: NEGATIVE
Urobilinogen, UA: 0.2 (ref 0.0–1.0)

## 2017-08-08 DIAGNOSIS — R3916 Straining to void: Secondary | ICD-10-CM | POA: Diagnosis not present

## 2017-08-08 DIAGNOSIS — R3911 Hesitancy of micturition: Secondary | ICD-10-CM | POA: Diagnosis not present

## 2017-09-05 ENCOUNTER — Other Ambulatory Visit: Payer: Self-pay | Admitting: Family Medicine

## 2017-09-05 MED ORDER — AMLODIPINE BESYLATE 5 MG PO TABS: 5.0000 mg | ORAL_TABLET | Freq: Every day | ORAL | 1 refills | Status: DC

## 2017-09-05 NOTE — Telephone Encounter (Signed)
Refill for amlodipine 5 mg tab. Note on last refill was hold until next refill because of new sig.  LR 11/04/17  For # 90 tabs Provider  Dr. Dan[facility]  [id number]  LOV  02/14/17 NOV  12/30/17

## 2017-09-05 NOTE — Telephone Encounter (Signed)
Pt last seen for acute visit, 05/30/17; appt scheduled for annual 12/30/17;Please advise. Last refilled amlodipine 5 mg tab # 90 x 1 on 11/04/2016. [facility] [place].

## 2017-09-05 NOTE — Telephone Encounter (Signed)
Copied from [place] [id number]. Topic: Quick Communication - Rx Refill/Question >> Sep 05, 2017  9:50 AM Scherrie Gerlach wrote: Medication: amLODipine (NORVASC) 5 MG tablet Has the patient contacted their pharmacy? no 90 day [place] [place] ([place]), [place] [phone] (Phone) [phone] (Fax)

## 2017-09-09 DIAGNOSIS — R3911 Hesitancy of micturition: Secondary | ICD-10-CM | POA: Diagnosis not present

## 2017-09-12 ENCOUNTER — Encounter: Payer: Self-pay | Admitting: Family Medicine

## 2017-12-15 DIAGNOSIS — M4716 Other spondylosis with myelopathy, lumbar region: Secondary | ICD-10-CM | POA: Diagnosis not present

## 2017-12-15 DIAGNOSIS — I10 Essential (primary) hypertension: Secondary | ICD-10-CM | POA: Diagnosis not present

## 2017-12-15 DIAGNOSIS — Z6829 Body mass index (BMI) 29.0-29.9, adult: Secondary | ICD-10-CM | POA: Diagnosis not present

## 2017-12-15 DIAGNOSIS — M4316 Spondylolisthesis, lumbar region: Secondary | ICD-10-CM | POA: Diagnosis not present

## 2017-12-15 DIAGNOSIS — M47816 Spondylosis without myelopathy or radiculopathy, lumbar region: Secondary | ICD-10-CM | POA: Diagnosis not present

## 2017-12-20 ENCOUNTER — Encounter: Payer: Self-pay | Admitting: Family Medicine

## 2017-12-20 DIAGNOSIS — M4316 Spondylolisthesis, lumbar region: Secondary | ICD-10-CM | POA: Insufficient documentation

## 2017-12-23 ENCOUNTER — Other Ambulatory Visit: Payer: Self-pay | Admitting: Family Medicine

## 2017-12-27 ENCOUNTER — Ambulatory Visit (INDEPENDENT_AMBULATORY_CARE_PROVIDER_SITE_OTHER): Payer: Medicare Other

## 2017-12-27 ENCOUNTER — Ambulatory Visit: Payer: Medicare Other

## 2017-12-27 ENCOUNTER — Other Ambulatory Visit: Payer: Self-pay | Admitting: Family Medicine

## 2017-12-27 VITALS — BP 124/62 | HR 66 | Temp 98.6°F | Ht 61.5 in | Wt 160.8 lb

## 2017-12-27 DIAGNOSIS — E782 Mixed hyperlipidemia: Secondary | ICD-10-CM | POA: Diagnosis not present

## 2017-12-27 DIAGNOSIS — Z0000 Encounter for general adult medical examination without abnormal findings: Secondary | ICD-10-CM | POA: Diagnosis not present

## 2017-12-27 DIAGNOSIS — N183 Chronic kidney disease, stage 3 unspecified: Secondary | ICD-10-CM

## 2017-12-27 LAB — LIPID PANEL
CHOL/HDL RATIO: 3
Cholesterol: 109 mg/dL (ref 0–200)
HDL: 35.6 mg/dL — ABNORMAL LOW (ref >39.00)
LDL CALC: 48 mg/dL (ref 0–99)
NonHDL: 73.84
Triglycerides: 128 mg/dL (ref 0.0–149.0)
VLDL: 25.6 mg/dL (ref 0.0–40.0)

## 2017-12-27 LAB — COMPREHENSIVE METABOLIC PANEL
ALT: 20 U/L (ref 0–35)
AST: 20 U/L (ref 0–37)
Albumin: 4.3 g/dL (ref 3.5–5.2)
Alkaline Phosphatase: 79 U/L (ref 39–117)
BUN: 19 mg/dL (ref 6–23)
CALCIUM: 9.8 mg/dL (ref 8.4–10.5)
CHLORIDE: 103 meq/L (ref 96–112)
CO2: 26 meq/L (ref 19–32)
CREATININE: 1.23 mg/dL — AB (ref 0.40–1.20)
GFR: 44.94 mL/min — AB (ref >60.00)
GLUCOSE: 103 mg/dL — AB (ref 70–99)
Potassium: 4.3 mEq/L (ref 3.5–5.1)
Sodium: 139 mEq/L (ref 135–145)
TOTAL PROTEIN: 7.2 g/dL (ref 6.0–8.3)
Total Bilirubin: 0.6 mg/dL (ref 0.2–1.2)

## 2017-12-27 NOTE — Patient Instructions (Addendum)
Carrie Lucas , Thank you for taking time to come for your Medicare Wellness Visit. I appreciate your ongoing commitment to your health goals. Please review the following plan we discussed and let me know if I can assist you in the future.   These are the goals we discussed: Goals    . DIET - INCREASE WATER INTAKE     Starting 12/27/2017, I will attempt to drink at least 6-8 glasses of water daily.        This is a list of the screening recommended for you and due dates:  Health Maintenance  Topic Date Due  . Flu Shot  07/26/2018*  . DTaP/Tdap/Td vaccine (2 - Td) 05/11/2023  . Tetanus Vaccine  05/11/2023  . DEXA scan (bone density measurement)  Completed  . Pneumonia vaccines  Completed  *Topic was postponed. The date shown is not the original due date.   Preventive Care for Adults  A healthy lifestyle and preventive care can promote health and wellness. Preventive health guidelines for adults include the following key practices.  . A routine yearly physical is a good way to check with your health care provider about your health and preventive screening. It is a chance to share any concerns and updates on your health and to receive a thorough exam.  . Visit your dentist for a routine exam and preventive care every 6 months. Brush your teeth twice a day and floss once a day. Good oral hygiene prevents tooth decay and gum disease.  . The frequency of eye exams is based on your age, health, family medical history, use  of contact lenses, and other factors. Follow your health care provider's recommendations for frequency of eye exams.  . Eat a healthy diet. Foods like vegetables, fruits, whole grains, low-fat dairy products, and lean protein foods contain the nutrients you need without too many calories. Decrease your intake of foods high in solid fats, added sugars, and salt. Eat the right amount of calories for you. Get information about a proper diet from your health care provider, if  necessary.  . Regular physical exercise is one of the most important things you can do for your health. Most adults should get at least 150 minutes of moderate-intensity exercise (any activity that increases your heart rate and causes you to sweat) each week. In addition, most adults need muscle-strengthening exercises on 2 or more days a week.  Silver Sneakers may be a benefit available to you. To determine eligibility, you may visit the website: www.silversneakers.com or contact program at [phone] Mon-Fri between 8AM-8PM.   . Maintain a healthy weight. The body mass index (BMI) is a screening tool to identify possible weight problems. It provides an estimate of body fat based on height and weight. Your health care provider can find your BMI and can help you achieve or maintain a healthy weight.   For adults 20 years and older: ? A BMI below 18.5 is considered underweight. ? A BMI of 18.5 to 24.9 is normal. ? A BMI of 25 to 29.9 is considered overweight. ? A BMI of 30 and above is considered obese.   . Maintain normal blood lipids and cholesterol levels by exercising and minimizing your intake of saturated fat. Eat a balanced diet with plenty of fruit and vegetables. Blood tests for lipids and cholesterol should begin at age 81 and be repeated every 5 years. If your lipid or cholesterol levels are high, you are over 50, or you are at high  risk for heart disease, you may need your cholesterol levels checked more frequently. Ongoing high lipid and cholesterol levels should be treated with medicines if diet and exercise are not working.  . If you smoke, find out from your health care provider how to quit. If you do not use tobacco, please do not start.  . If you choose to drink alcohol, please do not consume more than 2 drinks per day. One drink is considered to be 12 ounces (355 mL) of beer, 5 ounces (148 mL) of wine, or 1.5 ounces (44 mL) of liquor.  . If you are 10-62 years old, ask your  health care provider if you should take aspirin to prevent strokes.  . Use sunscreen. Apply sunscreen liberally and repeatedly throughout the day. You should seek shade when your shadow is shorter than you. Protect yourself by wearing long sleeves, pants, a wide-brimmed hat, and sunglasses year round, whenever you are outdoors.  . Once a month, do a whole body skin exam, using a mirror to look at the skin on your back. Tell your health care provider of new moles, moles that have irregular borders, moles that are larger than a pencil eraser, or moles that have changed in shape or color.

## 2017-12-27 NOTE — Progress Notes (Signed)
PCP notes:   Health maintenance:  Flu vaccine - addressed  Abnormal screenings:   None  Patient concerns:   None  Nurse concerns:  None  Next PCP appt:   12/30/17 @ 1500

## 2017-12-27 NOTE — Progress Notes (Signed)
Subjective:   Carrie Lucas is a 77 y.o. female who presents for Medicare Annual (Subsequent) preventive examination.  Review of Systems:  N/A Cardiac Risk Factors include: advanced age (>14men, >58 women);dyslipidemia;hypertension     Objective:     Vitals: BP 124/62 (BP Location: Right Arm, Patient Position: Sitting, Cuff Size: Normal)   Pulse 66   Temp 98.6 F (37 C) (Oral)   Ht 5' 1.5" (1.562 m) Comment: no shoes  Wt 160 lb 12 oz (72.9 kg)   SpO2 98%   BMI 29.88 kg/m   Body mass index is 29.88 kg/m.  Advanced Directives 12/27/2017 12/20/2016 09/15/2016 12/18/2015 07/23/2015  Does Patient Have a Medical Advance Directive? Yes Yes No No No  Type of Advance Directive Living will [place];Living will - - -  Copy of [place] in Chart? No - copy requested Yes - - -  Would patient like information on creating a medical advance directive? - - Yes (MAU/Ambulatory/Procedural Areas - Information given) No - patient declined information No - patient declined information    Tobacco Social History   Tobacco Use  Smoking Status Never Smoker  Smokeless Tobacco Never Used     Counseling given: No   Clinical Intake:  Pre-visit preparation completed: Yes  Pain Score: 2      Nutritional Status: BMI 25 -29 Overweight Nutritional Risks: None Diabetes: No  How often do you need to have someone help you when you read instructions, pamphlets, or other written materials from your doctor or pharmacy?: 1 - Never What is the last grade level you completed in school?: 12th grade + 1 yr college  Interpreter Needed?: No  Comments: pt lives with spouse Information entered by :: LPinson, LPN  Past Medical History:  Diagnosis Date  . Arthritis    R middle finger, chronic arthralgias  . BCC (basal cell carcinoma of skin) 06/2015, 06/2017   supraclavicular, nasal bridge, R lower back - [name]  . Carotid stenosis, asymptomatic 12/16/2014   Mild  bilateral 1-39%, consider rpt [place] 1 yr (11/2014)   . Colon polyp   . DDD (degenerative disc disease), lumbar 2014   severe facet degeneration L4/5, mod spinal setnosis, mild foraminal stenosis  . Headache   . Hepatic steatosis 2012   mild by CT  . History of melanoma 2003   face (Dr Luetta Nutting)  . HLD (hyperlipidemia)   . HTN (hypertension)   . IBS (irritable bowel syndrome)   . Insomnia   . LPRD (laryngopharyngeal reflux disease)    Redmond Baseman)  . Osteopenia 2011   DEXA T -1.6 hip, -1.3 forearm (12/2015)  . Sialadenitis 05/2013   acute Red[facility] Baseman)   Past Surgical History:  Procedure Laterality Date  . ABI  06/2010   WNL  . BACK SURGERY  1995   lumbar  . BILATERAL OOPHORECTOMY  2007   ovarian cysts  . BLADDER SUSPENSION     midurethral sling for SUI  . CARPAL TUNNEL RELEASE Bilateral 2000  . COLONOSCOPY  8 30 12    TAx1, diverticulosis, rpt 5 yrs (Outlaw)  . COLONOSCOPY  03/2016   WNL (Outlaw)  . DEXA  2011   mild osteopenia (T -1.2 R femur)  . ESI  2014   L L4 transforaminal  . ESOPHAGOGASTRODUODENOSCOPY  12/24/2010   reflux esophagitis  . MOHS SURGERY  2016   BCC  . RADIAL KERATOTOMY Left   . TONSILLECTOMY  Childhood  . VAGINAL HYSTERECTOMY  bladder drop   Family History  Problem Relation Age of Onset  . Cancer Sister        breast  . Pulmonary fibrosis Mother 97  . Diabetes Father   . Hypertension Father   . Stroke Father 77  . Hyperlipidemia Father   . CAD Sister   . Cancer Sister        breast  . Stroke Sister    Social History   Socioeconomic History  . Marital status: Married    Spouse name: Not on file  . Number of children: Not on file  . Years of education: Not on file  . Highest education level: Not on file  Occupational History  . Not on file  Social Needs  . Financial resource strain: Not on file  . Food insecurity:    Worry: Not on file    Inability: Not on file  . Transportation needs:    Medical: Not on file    Non-medical: Not on file    Tobacco Use  . Smoking status: Never Smoker  . Smokeless tobacco: Never Used  Substance and Sexual Activity  . Alcohol use: No    Alcohol/week: 0.0 standard drinks  . Drug use: No  . Sexual activity: Yes  Lifestyle  . Physical activity:    Days per week: Not on file    Minutes per session: Not on file  . Stress: Not on file  Relationships  . Social connections:    Talks on phone: Not on file    Gets together: Not on file    Attends religious service: Not on file    Active member of club or organization: Not on file    Attends meetings of clubs or organizations: Not on file    Relationship status: Not on file  Other Topics Concern  . 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 Orpah Greek   Derm - Dr. Sherrye Payor   Dentist - Dr. Claudina Lick   ENT - Dr Redmond Baseman    Outpatient Encounter Medications as of 12/27/2017  Medication Sig  . acetaminophen (TYLENOL) 650 MG CR tablet Take 650 mg by mouth 2 (two) times daily.  Marland Kitchen amLODipine (NORVASC) 5 MG tablet Take 1 tablet (5 mg total) by mouth daily.  Marland Kitchen aspirin EC 81 MG tablet Take 1 tablet (81 mg total) by mouth every Monday, Wednesday, and Friday.  Marland Kitchen atorvastatin (LIPITOR) 20 MG tablet Take 1 tablet (20 mg total) by mouth daily.  . calcium carbonate (OS-CAL) 600 MG TABS tablet Take 1 tablet (600 mg total) by mouth daily with breakfast.  . Cholecalciferol (VITAMIN D) 2000 units CAPS Take 1 capsule by mouth daily.  Marland Kitchen losartan-hydrochlorothiazide (HYZAAR) 100-12.5 MG tablet TAKE 1 TABLET BY MOUTH ONCE DAILY  . Multiple Vitamin (MULTIVITAMIN) tablet Take 1 tablet by mouth daily.   No facility-administered encounter medications on file as of 12/27/2017.     Activities of Daily Living In your present state of health, do you have any  difficulty performing the following activities: 12/27/2017  Hearing? N  Vision? N  Difficulty concentrating or making decisions? N  Walking or climbing stairs? N  Dressing or bathing? N  Doing errands, shopping? N  Preparing Food and eating ? N  Using the Toilet? N  In the past six months, have you accidently leaked urine? N  Do you have problems with loss of bowel control? N  Managing your Medications? N  Managing your Finances? N  Housekeeping or managing your Housekeeping? N  Some recent data might be hidden    Patient Care Team: Ria Bush, MD as PCP - General (Family Medicine) Luberta Mutter, MD as Consulting Physician (Ophthalmology) Cheri Fowler, MD as Consulting Physician (Obstetrics and Gynecology) Harriett Sine, MD as Consulting Physician (Dermatology)    Assessment:   This is a routine wellness examination for Keirstyn.   Hearing Screening   125Hz  250Hz  500Hz  1000Hz  2000Hz  3000Hz  4000Hz  6000Hz  8000Hz   Right ear:   40 40 40  40    Left ear:   40 40 40  40    Vision Screening Comments: Vision exam in Nov 2018 with Dr. Ellie Lunch    Exercise Activities and Dietary recommendations Current Exercise Habits: The patient does not participate in regular exercise at present, Exercise limited by: None identified  Goals    . DIET - INCREASE WATER INTAKE     Starting 12/27/2017, I will attempt to drink at least 6-8 glasses of water daily.        Fall Risk Fall Risk  12/27/2017 12/20/2016 12/18/2015 12/16/2014 12/12/2013  Falls in the past year? No No Yes No No  Comment - - hx of accidental fall with injury (sprained wrist) - -  Number falls in past yr: - - 1 - -  Injury with Fall? - - Yes - -  Risk for fall due to : - - History of fall(s);Impaired balance/gait - -  Risk for fall due to: Comment - - pt reports at times her knee "buckles" without warning - -  Follow up - - Falls evaluation completed - -  Depression Screen PHQ 2/9 Scores 12/27/2017 12/20/2016 12/18/2015  12/18/2015  PHQ - 2 Score 0 0 0 0  PHQ- 9 Score 0 4 0 -     Cognitive Function MMSE - Mini Mental State Exam 12/27/2017 12/20/2016 12/18/2015  Orientation to time 5 5 5   Orientation to Place 5 5 5   Registration 3 3 3   Attention/ Calculation 0 0 0  Recall 3 3 3   Language- name 2 objects 0 0 0  Language- repeat 1 1 1   Language- follow 3 step command 3 3 3   Language- read & follow direction 0 0 0  Write a sentence 0 0 0  Copy design 0 0 0  Total score 20 20 20      PLEASE NOTE: A Mini-Cog screen was completed. Maximum score is 20. A value of 0 denotes this part of [place] MMSE was not completed or the patient failed this part of the Mini-Cog screening.   Mini-Cog Screening Orientation to Time - Max 5 pts Orientation to Place - Max 5 pts Registration - Max 3 pts Recall - Max 3 pts Language Repeat - Max 1 pts Language Follow 3 Step Command - Max 3 pts     Immunization History  Administered Date(s) Administered  . Influenza Whole 02/05/2010  . Influenza,inj,Quad PF,6+ Mos 12/12/2013, 05/21/2015, 03/12/2016, 02/17/2017  . Pneumococcal Conjugate-13 12/12/2013  . Pneumococcal Polysaccharide-23 08/24/2005  . Td 02/18/2001  . Tdap 05/10/2013  . Zoster 09/11/2013   Screening Tests Health Maintenance  Topic Date Due  . INFLUENZA VACCINE  07/26/2018 (Originally 11/24/2017)  . DTaP/Tdap/Td (2 - Td) 05/11/2023  . TETANUS/TDAP  05/11/2023  . DEXA  SCAN  Completed  . PNA vac Low Risk Adult  Completed      Plan:     I have personally reviewed, addressed, and noted the following in the patient's chart:  A. Medical and social history B. Use of alcohol, tobacco or illicit drugs  C. Current medications and supplements D. Functional ability and status E.  Nutritional status F.  Physical activity G. Advance directives H. List of other physicians I.  Hospitalizations, surgeries, and ER visits in previous 12 months J.  [place] to include hearing, vision, cognitive,  depression L. Referrals and appointments - none  In addition, I have reviewed and discussed with patient certain preventive protocols, quality metrics, and best practice recommendations. A written personalized care plan for preventive services as well as general preventive health recommendations were provided to patient.  See attached scanned questionnaire for additional information.   Signed,   Lindell Noe, MHA, BS, LPN Health Coach

## 2017-12-30 ENCOUNTER — Ambulatory Visit (INDEPENDENT_AMBULATORY_CARE_PROVIDER_SITE_OTHER): Payer: Medicare Other | Admitting: Family Medicine

## 2017-12-30 ENCOUNTER — Encounter: Payer: Self-pay | Admitting: Family Medicine

## 2017-12-30 VITALS — BP 134/62 | HR 78 | Temp 98.5°F | Ht 61.5 in | Wt 161.2 lb

## 2017-12-30 DIAGNOSIS — I6523 Occlusion and stenosis of bilateral carotid arteries: Secondary | ICD-10-CM

## 2017-12-30 DIAGNOSIS — Z7189 Other specified counseling: Secondary | ICD-10-CM | POA: Diagnosis not present

## 2017-12-30 DIAGNOSIS — I10 Essential (primary) hypertension: Secondary | ICD-10-CM | POA: Diagnosis not present

## 2017-12-30 DIAGNOSIS — N183 Chronic kidney disease, stage 3 unspecified: Secondary | ICD-10-CM

## 2017-12-30 DIAGNOSIS — E782 Mixed hyperlipidemia: Secondary | ICD-10-CM

## 2017-12-30 NOTE — Patient Instructions (Addendum)
Return for flu shot at your convenience.  If interested, check with pharmacy about new 2 shot shingles series (shingrix).  Advanced directive packet provided - mainly for health care power of attorney form.  You do have mild kidney disease - make sure to stay well hydrated. Return as needed or in 6 months for follow up visit.   Chronic Kidney Disease, Adult Chronic kidney disease (CKD) happens when the kidneys are damaged during a time of 3 or more months. The kidneys are two organs that do many important jobs in the body. These jobs include:  Removing wastes and extra fluids from the blood.  Making hormones that maintain the amount of fluid in your tissues and blood vessels.  Making sure that the body has the right amount of fluids and chemicals.  Most of the time, this condition does not go away, but it can usually be controlled. Steps must be taken to slow down the kidney damage or stop it from getting worse. Otherwise, the kidneys may stop working. Follow these instructions at home:  Follow your diet as told by your doctor. You may need to avoid alcohol, salty foods (sodium), and foods that are high in potassium, calcium, and protein.  Take over-the-counter and prescription medicines only as told by your doctor. Do not take any new medicines unless your doctor says you can do that. These include vitamins and minerals. ? Medicines and nutritional supplements can make kidney damage worse. ? Your doctor may need to change how much medicine you take.  Do not use any tobacco products. These include cigarettes, chewing tobacco, and e-cigarettes. If you need help quitting, ask your doctor.  Keep all follow-up visits as told by your doctor. This is important.  Check your blood pressure. Tell your doctor if there are changes to your blood pressure.  Get to a healthy weight. Stay at that weight. If you need help with this, ask your doctor.  Start or continue an exercise plan. Try to  exercise at least 30 minutes a day, 5 days a week.  Stay up-to-date with your shots (immunizations) as told by your doctor. Contact a doctor if:  Your symptoms get worse.  You have new symptoms. Get help right away if:  You have symptoms of end-stage kidney disease. These include: ? Headaches. ? Skin that is darker or lighter than normal. ? Numbness in your hands or feet. ? Easy bruising. ? Having hiccups often. ? Chest pain. ? Shortness of breath. ? Stopping of menstrual periods in women.  You have a fever.  You are making very little pee (urine).  You have pain or bleeding when you pee (urinate). This information is not intended to replace advice given to you by your health care provider. Make sure you discuss any questions you have with your health care provider. Document Released: 07/07/2009 Document Revised: 09/18/2015 Document Reviewed: 12/10/2011 Elsevier Interactive Patient Education  2017 [employer].

## 2017-12-30 NOTE — Assessment & Plan Note (Signed)
Chronic, stable. Continue lipitor.  The ASCVD Risk score (Goff DC Jr., et al., 2013) failed to calculate for the following reasons:   The valid total cholesterol range is 130 to 320 mg/dL  

## 2017-12-30 NOTE — Assessment & Plan Note (Addendum)
Chronic, reviewed with patient, as well as encouraged avoiding NSAIDs and increased water. RTC 6 mo CKD f/u. Will need SPEP next labs.

## 2017-12-30 NOTE — Progress Notes (Signed)
BP 134/62 (BP Location: Left Arm, Patient Position: Sitting, Cuff Size: Normal)   Pulse 78   Temp 98.5 F (36.9 C) (Oral)   Ht 5' 1.5" (1.562 m)   Wt 161 lb 4 oz (73.1 kg)   SpO2 94%   BMI 29.97 kg/m    CC: AMW f/u visit Subjective:    Patient ID: Carrie Lucas, female    DOB: 07/08/1940, 77 y.o.   MRN: [id number]  HPI: Carrie Lucas is a 77 y.o. female presenting on 12/30/2017 for Annual Exam (Pt 2.)   Saw Katha Cabal Monday for medicare wellness visit. Note reviewed.    Preventative: COLONOSCOPY Date: 11/2010 TAx1, diverticulosis, rpt 5 yrs (Outlaw). COLONOSCOPY 03/2016 - WNL (Outlaw) Well woman with GYN Dr. Orpah Greek, last saw 2017. S/p hysterectomy and oophorectomy. May not return.  Mammogram 03/2017 - returned for detailed imaging of left - benign cyst. +fmhx.  DEXA - 12/2015 DEXA T -1.6 hip, -1.3 forearm - mild osteopenia  Flu yearly Pneumovax 08/2005. prevnar 11/2013  Tdap 04/2013  zostavax - 08/2013 shingrix - discussed Advanced directives: Would want Fritz Pickerel or daughter to be POA - but form not filled out. Living will scanned 11/2016 Seat belt use discussed.  Sunscreen use discussed. No changing moles on skin. H/o melanoma, sees derm Dr Elvera Lennox yearly.  Never smoker, daughter smokes outdoors No alcohol.  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 Interim medical history since our last visit reviewed. Allergies and medications reviewed and updated. Outpatient Medications Prior to Visit  Medication Sig Dispense Refill  . acetaminophen (TYLENOL) 650 MG CR tablet Take 650 mg by mouth 2 (two) times daily.    Marland Kitchen amLODipine (NORVASC) 5 MG tablet Take 1 tablet (5 mg  total) by mouth daily. 90 tablet 1  . aspirin EC 81 MG tablet Take 1 tablet (81 mg total) by mouth every Monday, Wednesday, and Friday.    Marland Kitchen atorvastatin (LIPITOR) 20 MG tablet Take 1 tablet (20 mg total) by mouth daily. 90 tablet 3  . calcium carbonate (OS-CAL) 600 MG TABS tablet Take 1 tablet (600 mg total) by mouth daily with breakfast.    . Cholecalciferol (VITAMIN D) 2000 units CAPS Take 1 capsule by mouth daily.    Marland Kitchen losartan-hydrochlorothiazide (HYZAAR) 100-12.5 MG tablet TAKE 1 TABLET BY MOUTH ONCE DAILY 90 tablet 0  . Multiple Vitamin (MULTIVITAMIN) tablet Take 1 tablet by mouth daily.     No facility-administered medications prior to visit.      Per HPI unless specifically indicated in ROS section below Review of Systems     Objective:    BP 134/62 (BP Location: Left Arm, Patient Position: Sitting, Cuff Size: Normal)   Pulse 78   Temp 98.5 F (36.9 C) (Oral)   Ht 5' 1.5" (1.562 m)   Wt 161 lb 4 oz (73.1 kg)   SpO2 94%   BMI 29.97 kg/m   Wt Readings from Last 3 Encounters:  12/30/17 161 lb 4 oz (73.1 kg)  12/27/17 160 lb 12 oz (72.9 kg)  05/30/17 157 lb (71.2 kg)    Physical Exam  Constitutional: She is oriented to person, place, and time. She appears well-developed and well-nourished. No distress.  HENT:  Head: Normocephalic and atraumatic.  Right Ear: Hearing, tympanic membrane, external ear and ear canal normal.  Left Ear: Hearing, tympanic membrane, external ear and ear canal normal.  Nose: Nose normal.  Mouth/Throat: Uvula is midline, oropharynx is clear and moist and mucous membranes are normal. No oropharyngeal exudate, posterior oropharyngeal edema or posterior oropharyngeal erythema.  Eyes: Pupils are equal, round, and reactive to light. Conjunctivae and EOM are normal. No scleral icterus.  Neck: Normal range of motion. Neck supple. Carotid bruit is not present. No thyromegaly present.  Cardiovascular: Normal rate, regular rhythm, normal heart sounds and  intact distal pulses.  No murmur heard. Pulses:      Radial pulses are 2+ on the right side, and 2+ on the left side.  Pulmonary/Chest: Effort normal and breath sounds normal. No respiratory distress. She has no wheezes. She has no rales.  Abdominal: Soft. Bowel sounds are normal. She exhibits no distension and no mass. There is no tenderness. There is no rebound and no guarding.  Musculoskeletal: Normal range of motion. She exhibits no edema.  Lymphadenopathy:    She has no cervical adenopathy.  Neurological: She is alert and oriented to person, place, and time.  CN grossly intact, station and gait intact  Skin: Skin is warm and dry. No rash noted.  Psychiatric: She has a normal mood and affect. Her behavior is normal. Judgment and thought content normal.  Nursing note and vitals reviewed.  Results for orders placed or performed in visit on 12/27/17  Comprehensive metabolic panel  Result Value Ref Range   Sodium 139 135 - 145 mEq/L   Potassium 4.3 3.5 - 5.1 mEq/L   Chloride 103 96 - 112 mEq/L   CO2 26 19 - 32 mEq/L   Glucose, Bld 103 (H) 70 - 99 mg/dL   BUN 19 6 - 23 mg/dL   Creatinine, Ser 1.23 (H) 0.40 - 1.20 mg/dL   Total Bilirubin 0.6 0.2 - 1.2 mg/dL   Alkaline Phosphatase 79 39 - 117 U/L   AST 20 0 - 37 U/L   ALT 20 0 - 35 U/L   Total Protein 7.2 6.0 - 8.3 g/dL   Albumin 4.3 3.5 - 5.2 g/dL   Calcium 9.8 8.4 - 10.5 mg/dL   GFR 44.94 (L) >60.00 mL/min  Lipid panel  Result Value Ref Range   Cholesterol 109 0 - 200 mg/dL   Triglycerides 128.0 0.0 - 149.0 mg/dL   HDL 35.60 (L) >39.00 mg/dL   VLDL 25.6 0.0 - 40.0 mg/dL   LDL Cholesterol 48 0 - 99 mg/dL   Total CHOL/HDL Ratio 3    NonHDL 73.84       Assessment & Plan:   Problem List Items Addressed This Visit    HTN (hypertension)    Chronic, stable. Continue current regimen.       HLD (hyperlipidemia) (Chronic)    Chronic, stable. Continue lipitor.  The ASCVD Risk score Mikey Bussing DC Jr., et al., 2013) failed to  calculate for the following reasons:   The valid total cholesterol range is 130 to 320 mg/dL       CKD (chronic kidney disease) stage 3, GFR 30-59 ml/min (HCC)    Chronic, reviewed with patient, as well as encouraged avoiding NSAIDs and increased water. RTC 6 mo CKD f/u. Will need SPEP next labs.       Carotid stenosis, asymptomatic    Not appreciated today.       Advanced care planning/counseling discussion - Primary  Advanced directives: Would want Fritz Pickerel or daughter to be POA - but form not filled out. Living will scanned 11/2016          No orders of the defined types were placed in this encounter.  No orders of the defined types were placed in this encounter.   Follow up plan: Return in about 6 months (around 06/30/2018) for follow up visit.  Ria Bush, MD

## 2017-12-30 NOTE — Assessment & Plan Note (Signed)
Not appreciated today.  

## 2017-12-30 NOTE — Assessment & Plan Note (Signed)
Chronic, stable. Continue current regimen. 

## 2017-12-30 NOTE — Assessment & Plan Note (Addendum)
Advanced directives: Would want Fritz Pickerel or daughter to be POA - but form not filled out. Living will scanned 11/2016

## 2017-12-31 ENCOUNTER — Other Ambulatory Visit: Payer: Self-pay

## 2017-12-31 ENCOUNTER — Encounter (HOSPITAL_BASED_OUTPATIENT_CLINIC_OR_DEPARTMENT_OTHER): Payer: Self-pay | Admitting: *Deleted

## 2017-12-31 ENCOUNTER — Emergency Department (HOSPITAL_BASED_OUTPATIENT_CLINIC_OR_DEPARTMENT_OTHER)
Admission: EM | Admit: 2017-12-31 | Discharge: 2017-12-31 | Disposition: A | Discharge status: Home / Self Care | Payer: Medicare Other | Attending: Emergency Medicine | Admitting: Emergency Medicine

## 2017-12-31 DIAGNOSIS — N390 Urinary tract infection, site not specified: Secondary | ICD-10-CM | POA: Diagnosis not present

## 2017-12-31 DIAGNOSIS — N183 Chronic kidney disease, stage 3 (moderate): Secondary | ICD-10-CM | POA: Insufficient documentation

## 2017-12-31 DIAGNOSIS — R3915 Urgency of urination: Secondary | ICD-10-CM | POA: Diagnosis present

## 2017-12-31 DIAGNOSIS — Z7982 Long term (current) use of aspirin: Secondary | ICD-10-CM | POA: Insufficient documentation

## 2017-12-31 DIAGNOSIS — I129 Hypertensive chronic kidney disease with stage 1 through stage 4 chronic kidney disease, or unspecified chronic kidney disease: Secondary | ICD-10-CM | POA: Diagnosis not present

## 2017-12-31 DIAGNOSIS — Z79899 Other long term (current) drug therapy: Secondary | ICD-10-CM | POA: Diagnosis not present

## 2017-12-31 HISTORY — DX: Chronic kidney disease, stage 3 unspecified: N18.30

## 2017-12-31 HISTORY — DX: Chronic kidney disease, stage 3 (moderate): N18.3

## 2017-12-31 LAB — URINALYSIS, ROUTINE W REFLEX MICROSCOPIC
BILIRUBIN URINE: NEGATIVE
Glucose, UA: NEGATIVE mg/dL
KETONES UR: NEGATIVE mg/dL
Nitrite: NEGATIVE
PH: 5.5 (ref 5.0–8.0)
Protein, ur: NEGATIVE mg/dL
Specific Gravity, Urine: 1.025 (ref 1.005–1.030)

## 2017-12-31 LAB — URINALYSIS, MICROSCOPIC (REFLEX)

## 2017-12-31 MED ORDER — CEPHALEXIN 500 MG PO CAPS: 500.0000 mg | ORAL_CAPSULE | Freq: Two times a day (BID) | ORAL | 0 refills | Status: AC

## 2017-12-31 NOTE — ED Triage Notes (Signed)
Pt reports frequency and  Dysuria x 1 day. States sx are consistent with UTI in the past

## 2017-12-31 NOTE — ED Provider Notes (Signed)
[facility] EMERGENCY DEPARTMENT Provider Note   CSN: [id number] Arrival date & time: 12/31/17  1323     History   Chief Complaint Chief Complaint  Patient presents with  . Dysuria    HPI Carrie Lucas is a 77 y.o. female.  HPI Presents with concern for urinary symptoms beginning today. Feels like prior UTIs.  Describes urgency, hesitancy, frequency. Severe. In past has been better with azo but has not taken it yet.  No fevers, no vomiting, no nausea, no flank pain, no abdominal pain.   Past Medical History:  Diagnosis Date  . Arthritis    R middle finger, chronic arthralgias  . BCC (basal cell carcinoma of skin) 06/2015, 06/2017   supraclavicular, nasal bridge, R lower back - [name]  . Carotid stenosis, asymptomatic 12/16/2014   Mild bilateral 1-39%, consider rpt [place] 1 yr (11/2014)   . Chronic renal disease, stage III ([place])   . Colon polyp   . DDD (degenerative disc disease), lumbar 2014   severe facet degeneration L4/5, mod spinal setnosis, mild foraminal stenosis  . Headache   . Hepatic steatosis 2012   mild by CT  . History of melanoma 2003   face (Dr Luetta Nutting)  . HLD (hyperlipidemia)   . HTN (hypertension)   . IBS (irritable bowel syndrome)   . Insomnia   . LPRD (laryngopharyngeal reflux disease)    [place] Baseman)  . Osteopenia 2011   DEXA T -1.6 hip, -1.3 forearm (12/2015)  . Sialadenitis 05/2013   acute [place] Baseman)    Patient Active Problem List   Diagnosis Date Noted  . Spondylolisthesis, lumbar region 12/20/2017  . UTI (urinary tract infection) 10/22/2016  . BPPV (benign paroxysmal positional vertigo), left 09/07/2016  . Advanced care planning/counseling discussion 12/16/2014  . Carotid stenosis, asymptomatic 12/16/2014  . Medicare annual wellness visit, subsequent 12/12/2013  . CKD (chronic kidney disease) stage 3, GFR 30-59 ml/min (HCC) 12/12/2013  . DDD (degenerative disc disease), lumbar   . Hepatic steatosis   . Osteopenia   . HTN (hypertension)     . HLD (hyperlipidemia)   . LPRD (laryngopharyngeal reflux disease)   . Insomnia   . IBS (irritable bowel syndrome)   . Osteoarthritis   . Abdominal bloating 09/09/2013    Past Surgical History:  Procedure Laterality Date  . ABI  06/2010   WNL  . BACK SURGERY  1995   lumbar  . BILATERAL OOPHORECTOMY  2007   ovarian cysts  . BLADDER SUSPENSION     midurethral sling for SUI  . CARPAL TUNNEL RELEASE Bilateral 2000  . COLONOSCOPY  8 30 12    TAx1, diverticulosis, rpt 5 yrs (Outlaw)  . COLONOSCOPY  03/2016   WNL (Outlaw)  . DEXA  2011   mild osteopenia (T -1.2 R femur)  . ESI  2014   L L4 transforaminal  . ESOPHAGOGASTRODUODENOSCOPY  12/24/2010   reflux esophagitis  . MOHS SURGERY  2016   BCC  . RADIAL KERATOTOMY Left   . TONSILLECTOMY  Childhood  . VAGINAL HYSTERECTOMY     bladder drop     OB History   None      Home Medications    Prior to Admission medications   Medication Sig Start Date End Date Taking? Authorizing Provider  acetaminophen (TYLENOL) 650 MG CR tablet Take 650 mg by mouth 2 (two) times daily.   Yes [provider]  amLODipine (NORVASC) 5 MG tablet Take 1 tablet (5 mg total) by mouth daily. 09/05/17  Yes Ria Bush, MD  aspirin EC 81 MG tablet Take 1 tablet (81 mg total) by mouth every Monday, Wednesday, and Friday. 12/19/15  Yes Ria Bush, MD  atorvastatin (LIPITOR) 20 MG tablet Take 1 tablet (20 mg total) by mouth daily. 12/23/16  Yes Ria Bush, MD  calcium carbonate (OS-CAL) 600 MG TABS tablet Take 1 tablet (600 mg total) by mouth daily with breakfast. 12/23/16  Yes Ria Bush, MD  Cholecalciferol (VITAMIN D) 2000 units CAPS Take 1 capsule by mouth daily.   Yes [provider]  losartan-hydrochlorothiazide (HYZAAR) 100-12.5 MG tablet TAKE 1 TABLET BY MOUTH ONCE DAILY 12/23/17  Yes Ria Bush, MD  Multiple Vitamin (MULTIVITAMIN) tablet Take 1 tablet by mouth daily.   Yes [provider]   cephALEXin (KEFLEX) 500 MG capsule Take 1 capsule (500 mg total) by mouth 2 (two) times daily for 7 days. 12/31/17 01/07/18  Gareth Morgan, MD    Family History Family History  Problem Relation Age of Onset  . Cancer Sister        breast  . Pulmonary fibrosis Mother 67  . Diabetes Father   . Hypertension Father   . Stroke Father 19  . Hyperlipidemia Father   . CAD Sister   . Cancer Sister        breast  . Stroke Sister     Social History Social History   Tobacco Use  . Smoking status: Never Smoker  . Smokeless tobacco: Never Used  Substance Use Topics  . Alcohol use: No    Alcohol/week: 0.0 standard drinks  . Drug use: No     Allergies   Naproxen   Review of Systems Review of Systems  Constitutional: Negative for fever.  HENT: Negative for sore throat.   Eyes: Negative for visual disturbance.  Respiratory: Negative for cough and shortness of breath.   Cardiovascular: Negative for chest pain.  Gastrointestinal: Negative for abdominal pain, nausea and vomiting.  Genitourinary: Positive for dysuria, frequency and urgency. Negative for difficulty urinating.  Musculoskeletal: Negative for back pain and neck pain.  Skin: Negative for rash.  Neurological: Negative for syncope and headaches.     Physical Exam Updated Vital Signs BP 136/72 (BP Location: Right Arm)   Pulse 77   Temp 99.3 F (37.4 C) (Oral)   Resp 18   Ht 5' 1.5" (1.562 m)   Wt 73.1 kg Comment: chart review  SpO2 95%   BMI 29.96 kg/m   Physical Exam  Constitutional: She is oriented to person, place, and time. She appears well-developed and well-nourished. No distress.  HENT:  Head: Normocephalic and atraumatic.  Eyes: Conjunctivae and EOM are normal.  Neck: Normal range of motion.  Cardiovascular: Normal rate, regular rhythm and intact distal pulses.  Pulmonary/Chest: Effort normal. No respiratory distress.  Abdominal: Soft. She exhibits no distension. There is no tenderness. There is no  guarding.  No CVA tenderness  Musculoskeletal: She exhibits no edema or tenderness.  Neurological: She is alert and oriented to person, place, and time.  Skin: Skin is warm and dry. No rash noted. She is not diaphoretic. No erythema.  Nursing note and vitals reviewed.    ED Treatments / Results  Labs (all labs ordered are listed, but only abnormal results are displayed) Labs Reviewed  URINALYSIS, ROUTINE W REFLEX MICROSCOPIC - Abnormal; Notable for the following components:      Result Value   APPearance CLOUDY (*)    Hgb urine dipstick MODERATE (*)    Leukocytes,  UA SMALL (*)    All other components within normal limits  URINALYSIS, MICROSCOPIC (REFLEX) - Abnormal; Notable for the following components:   Bacteria, UA MANY (*)    All other components within normal limits  URINE CULTURE    EKG None  Radiology No results found.  Procedures Procedures (including critical care time)  Medications Ordered in ED Medications - No data to display   Initial Impression / Assessment and Plan / ED Course  I have reviewed the triage vital signs and the nursing notes.  Pertinent labs & imaging results that were available during my care of the patient were reviewed by me and considered in my medical decision making (see chart for details).     77yo female presents with urinary symptoms.  Urinalysis consistent with UTI.  No sign of pyelonephritis nor sepsis. Given rx keflex x7 days.  Final Clinical Impressions(s) / ED Diagnoses   Final diagnoses:  Lower urinary tract infectious disease    ED Discharge Orders         Ordered    cephALEXin (KEFLEX) 500 MG capsule  2 times daily     12/31/17 1415           Gareth Morgan, MD 12/31/17 2000

## 2018-01-02 DIAGNOSIS — M47816 Spondylosis without myelopathy or radiculopathy, lumbar region: Secondary | ICD-10-CM | POA: Diagnosis not present

## 2018-01-03 LAB — URINE CULTURE: Culture: >=100000 — AB

## 2018-01-04 ENCOUNTER — Telehealth: Payer: Self-pay | Admitting: Emergency Medicine

## 2018-01-04 NOTE — Telephone Encounter (Signed)
Post ED Visit - Positive Culture Follow-up  Culture report reviewed by antimicrobial stewardship pharmacist:  []  Elenor Quinones, Pharm.D. []  Heide Guile, Pharm.D., BCPS AQ-ID []  Parks Neptune, Pharm.D., BCPS []  Alycia Rossetti, Pharm.D., BCPS []  Chancellor, [place].D., BCPS, AAHIVP []  Legrand Como, Pharm.D., BCPS, AAHIVP []  Salome Arnt, PharmD, BCPS []  Johnnette Gourd, PharmD, BCPS []  Hughes Better, PharmD, BCPS []  Leeroy Cha, PharmD Gwenlyn Found PharmD  Positive urine culture Treated with cephalexin, organism sensitive to the same and no further patient follow-up is required at this time.  Hazle Nordmann 01/04/2018, 9:21 AM

## 2018-01-05 ENCOUNTER — Ambulatory Visit (INDEPENDENT_AMBULATORY_CARE_PROVIDER_SITE_OTHER): Payer: Medicare Other

## 2018-01-05 DIAGNOSIS — Z23 Encounter for immunization: Secondary | ICD-10-CM | POA: Diagnosis not present

## 2018-01-06 NOTE — Progress Notes (Signed)
I reviewed health advisor's note, was available for consultation, and agree with documentation and plan.  

## 2018-01-24 ENCOUNTER — Other Ambulatory Visit: Payer: Self-pay | Admitting: Family Medicine

## 2018-02-26 ENCOUNTER — Other Ambulatory Visit: Payer: Self-pay | Admitting: Family Medicine

## 2018-02-27 ENCOUNTER — Other Ambulatory Visit: Payer: Self-pay | Admitting: Family Medicine

## 2018-02-27 DIAGNOSIS — Z1231 Encounter for screening mammogram for malignant neoplasm of breast: Secondary | ICD-10-CM

## 2018-03-06 DIAGNOSIS — H52202 Unspecified astigmatism, left eye: Secondary | ICD-10-CM | POA: Diagnosis not present

## 2018-03-06 DIAGNOSIS — Z961 Presence of intraocular lens: Secondary | ICD-10-CM | POA: Diagnosis not present

## 2018-03-06 DIAGNOSIS — H11003 Unspecified pterygium of eye, bilateral: Secondary | ICD-10-CM | POA: Diagnosis not present

## 2018-03-27 ENCOUNTER — Other Ambulatory Visit: Payer: Self-pay | Admitting: Family Medicine

## 2018-03-28 NOTE — Telephone Encounter (Signed)
[facility] requesting refill losartan HCTZ. Pt had annual exam on 12/30/17. Refilled per protocol. Pt has 6 mth FU on 06/30/18.

## 2018-04-11 ENCOUNTER — Ambulatory Visit
Admission: RE | Admit: 2018-04-11 | Discharge: 2018-04-11 | Disposition: A | Discharge status: Home / Self Care | Payer: Medicare Other | Source: Ambulatory Visit | Attending: Family Medicine | Admitting: Family Medicine

## 2018-04-11 DIAGNOSIS — Z1231 Encounter for screening mammogram for malignant neoplasm of breast: Secondary | ICD-10-CM

## 2018-04-11 LAB — HM MAMMOGRAPHY

## 2018-04-12 ENCOUNTER — Encounter: Payer: Self-pay | Admitting: Family Medicine

## 2018-06-25 ENCOUNTER — Other Ambulatory Visit: Payer: Self-pay | Admitting: Family Medicine

## 2018-06-25 DIAGNOSIS — N183 Chronic kidney disease, stage 3 unspecified: Secondary | ICD-10-CM

## 2018-06-26 ENCOUNTER — Other Ambulatory Visit (INDEPENDENT_AMBULATORY_CARE_PROVIDER_SITE_OTHER): Payer: Medicare Other

## 2018-06-26 DIAGNOSIS — N183 Chronic kidney disease, stage 3 unspecified: Secondary | ICD-10-CM

## 2018-06-26 LAB — CBC WITH DIFFERENTIAL/PLATELET
BASOS ABS: 0.1 10*3/uL (ref 0.0–0.1)
Basophils Relative: 1.1 % (ref 0.0–3.0)
EOS ABS: 0.2 10*3/uL (ref 0.0–0.7)
Eosinophils Relative: 2.5 % (ref 0.0–5.0)
HCT: 37.2 % (ref 36.0–46.0)
Hemoglobin: 12.6 g/dL (ref 12.0–15.0)
Lymphocytes Relative: 45.3 % (ref 12.0–46.0)
Lymphs Abs: 3 10*3/uL (ref 0.7–4.0)
MCHC: 33.8 g/dL (ref 30.0–36.0)
MCV: 91.9 fl (ref 78.0–100.0)
MONO ABS: 0.7 10*3/uL (ref 0.1–1.0)
Monocytes Relative: 10.1 % (ref 3.0–12.0)
NEUTROS PCT: 41 % — AB (ref 43.0–77.0)
Neutro Abs: 2.7 10*3/uL (ref 1.4–7.7)
Platelets: 333 10*3/uL (ref 150.0–400.0)
RBC: 4.04 Mil/uL (ref 3.87–5.11)
RDW: 13.4 % (ref 11.5–15.5)
WBC: 6.5 10*3/uL (ref 4.0–10.5)

## 2018-06-26 LAB — RENAL FUNCTION PANEL
ALBUMIN: 4.3 g/dL (ref 3.5–5.2)
BUN: 21 mg/dL (ref 6–23)
CO2: 28 mEq/L (ref 19–32)
Calcium: 9.8 mg/dL (ref 8.4–10.5)
Chloride: 104 mEq/L (ref 96–112)
Creatinine, Ser: 1.16 mg/dL (ref 0.40–1.20)
GFR: 45.18 mL/min — ABNORMAL LOW (ref >60.00)
GLUCOSE: 94 mg/dL (ref 70–99)
POTASSIUM: 3.9 meq/L (ref 3.5–5.1)
Phosphorus: 3.3 mg/dL (ref 2.3–4.6)
SODIUM: 141 meq/L (ref 135–145)

## 2018-06-26 LAB — VITAMIN D 25 HYDROXY (VIT D DEFICIENCY, FRACTURES): VITD: 68.15 ng/mL (ref 30.00–100.00)

## 2018-06-28 LAB — PROTEIN ELECTROPHORESIS, SERUM, WITH REFLEX
ALPHA 1: 0.3 g/dL (ref 0.2–0.3)
ALPHA 2: 0.9 g/dL (ref 0.5–0.9)
Albumin ELP: 4 g/dL (ref 3.8–4.8)
BETA 2: 0.4 g/dL (ref 0.2–0.5)
BETA GLOBULIN: 0.5 g/dL (ref 0.4–0.6)
GAMMA GLOBULIN: 1 g/dL (ref 0.8–1.7)
TOTAL PROTEIN: 7 g/dL (ref 6.1–8.1)

## 2018-06-28 LAB — PARATHYROID HORMONE, INTACT (NO CA): PTH: 47 pg/mL (ref 14–64)

## 2018-06-30 ENCOUNTER — Ambulatory Visit (INDEPENDENT_AMBULATORY_CARE_PROVIDER_SITE_OTHER): Payer: Medicare Other | Admitting: Family Medicine

## 2018-06-30 ENCOUNTER — Encounter: Payer: Self-pay | Admitting: Family Medicine

## 2018-06-30 VITALS — BP 132/64 | HR 86 | Temp 98.6°F | Ht 61.5 in | Wt 161.4 lb

## 2018-06-30 DIAGNOSIS — I10 Essential (primary) hypertension: Secondary | ICD-10-CM

## 2018-06-30 DIAGNOSIS — N183 Chronic kidney disease, stage 3 unspecified: Secondary | ICD-10-CM

## 2018-06-30 NOTE — Progress Notes (Signed)
BP 132/64 (BP Location: Left Arm, Patient Position: Sitting, Cuff Size: Normal)   Pulse 86   Temp 98.6 F (37 C) (Oral)   Ht 5' 1.5" (1.562 m)   Wt 161 lb 7 oz (73.2 kg)   SpO2 96%   BMI 30.01 kg/m    CC: 6 mo f/u visit  Subjective:    Patient ID: Carrie Lucas, female    DOB: 09-Apr-1941, 78 y.o.   MRN: [id number]  HPI: Carrie Lucas is a 78 y.o. female presenting on 06/30/2018 for Follow-up (Here for 6 mo f/u.)    HTN - Compliant with current antihypertensive regimen of amlodipine 5mg  daily, losartan hctz 100/12.5mg  daily both at night. Does not check blood pressures at home. No low blood pressure readings or symptoms of dizziness/syncope. Denies HA, vision changes, CP/tightness, SOB, leg swelling.    CKD - avoids NSAIDs, making effort to stay well hydrated.     Relevant past medical, surgical, family and social history reviewed and updated as indicated. Interim medical history since our last visit reviewed. Allergies and medications reviewed and updated. Outpatient Medications Prior to Visit  Medication Sig Dispense Refill  . acetaminophen (TYLENOL) 650 MG CR tablet Take 650 mg by mouth 2 (two) times daily.    Marland Kitchen amLODipine (NORVASC) 5 MG tablet TAKE 1 TABLET BY MOUTH ONCE DAILY 90 tablet 1  . aspirin EC 81 MG tablet Take 1 tablet (81 mg total) by mouth every Monday, Wednesday, and Friday.    Marland Kitchen atorvastatin (LIPITOR) 20 MG tablet TAKE 1 TABLET BY MOUTH ONCE DAILY 90 tablet 3  . calcium carbonate (OS-CAL) 600 MG TABS tablet Take 1 tablet (600 mg total) by mouth daily with breakfast.    . Cholecalciferol (VITAMIN D) 2000 units CAPS Take 1 capsule by mouth daily.    Marland Kitchen losartan-hydrochlorothiazide (HYZAAR) 100-12.5 MG tablet TAKE 1 TABLET BY MOUTH ONCE DAILY 90 tablet 3  . Multiple Vitamin (MULTIVITAMIN) tablet Take 1 tablet by mouth daily.     No facility-administered medications prior to visit.      Per HPI unless specifically indicated in ROS section below Review of  Systems Objective:    BP 132/64 (BP Location: Left Arm, Patient Position: Sitting, Cuff Size: Normal)   Pulse 86   Temp 98.6 F (37 C) (Oral)   Ht 5' 1.5" (1.562 m)   Wt 161 lb 7 oz (73.2 kg)   SpO2 96%   BMI 30.01 kg/m   Wt Readings from Last 3 Encounters:  06/30/18 161 lb 7 oz (73.2 kg)  12/31/17 161 lb 2.5 oz (73.1 kg)  12/30/17 161 lb 4 oz (73.1 kg)    Physical Exam Vitals signs and nursing note reviewed.  Constitutional:      Appearance: Normal appearance. She is not ill-appearing.  Cardiovascular:     Rate and Rhythm: Normal rate and regular rhythm.     Pulses: Normal pulses.     Heart sounds: Normal heart sounds. No murmur.  Pulmonary:     Effort: Pulmonary effort is normal. No respiratory distress.     Breath sounds: Normal breath sounds. No wheezing, rhonchi or rales.  Neurological:     Mental Status: She is alert.       Results for orders placed or performed in visit on 06/26/18  Parathyroid hormone, intact (no Ca)  Result Value Ref Range   PTH 47 14 - 64 pg/mL  Serum protein electrophoresis with reflex  Result Value Ref Range   Total Protein  7.0 6.1 - 8.1 g/dL   Albumin ELP 4.0 3.8 - 4.8 g/dL   Alpha 1 0.3 0.2 - 0.3 g/dL   Alpha 2 0.9 0.5 - 0.9 g/dL   Beta Globulin 0.5 0.4 - 0.6 g/dL   Beta 2 0.4 0.2 - 0.5 g/dL   Gamma Globulin 1.0 0.8 - 1.7 g/dL   SPE Interp.    Renal function panel  Result Value Ref Range   Sodium 141 135 - 145 mEq/L   Potassium 3.9 3.5 - 5.1 mEq/L   Chloride 104 96 - 112 mEq/L   CO2 28 19 - 32 mEq/L   Calcium 9.8 8.4 - 10.5 mg/dL   Albumin 4.3 3.5 - 5.2 g/dL   BUN 21 6 - 23 mg/dL   Creatinine, Ser 1.16 0.40 - 1.20 mg/dL   Glucose, Bld 94 70 - 99 mg/dL   Phosphorus 3.3 2.3 - 4.6 mg/dL   GFR 45.18 (L) >60.00 mL/min  CBC with Differential/Platelet  Result Value Ref Range   WBC 6.5 4.0 - 10.5 K/uL   RBC 4.04 3.87 - 5.11 Mil/uL   Hemoglobin 12.6 12.0 - 15.0 g/dL   HCT 37.2 36.0 - 46.0 %   MCV 91.9 78.0 - 100.0 fl   MCHC 33.8  30.0 - 36.0 g/dL   RDW 13.4 11.5 - 15.5 %   Platelets 333.0 150.0 - 400.0 K/uL   Neutrophils Relative % 41.0 (L) 43.0 - 77.0 %   Lymphocytes Relative 45.3 12.0 - 46.0 %   Monocytes Relative 10.1 3.0 - 12.0 %   Eosinophils Relative 2.5 0.0 - 5.0 %   Basophils Relative 1.1 0.0 - 3.0 %   Neutro Abs 2.7 1.4 - 7.7 K/uL   Lymphs Abs 3.0 0.7 - 4.0 K/uL   Monocytes Absolute 0.7 0.1 - 1.0 K/uL   Eosinophils Absolute 0.2 0.0 - 0.7 K/uL   Basophils Absolute 0.1 0.0 - 0.1 K/uL  VITAMIN D 25 Hydroxy (Vit-D Deficiency, Fractures)  Result Value Ref Range   VITD 68.15 30.00 - 100.00 ng/mL   Assessment & Plan:   Problem List Items Addressed This Visit    HTN (hypertension)    Chronic, stable. Continue current regimen.       Relevant Orders   US Renal   CKD (chronic kidney disease) stage 3, GFR 30-59 ml/min (HCC) - Primary    Reviewed with patient. Handout provided. SPEP, PTH, vit D, CBC normal. Anticipate hypertensive nephropathy. Update renal US.       Relevant Orders   US Renal       No orders of the defined types were placed in this encounter.  Orders Placed This Encounter  Procedures  . US Renal    Standing Status:   Future    Standing Expiration Date:   08/30/2019    Order Specific Question:   Reason for Exam (SYMPTOM  OR DIAGNOSIS REQUIRED)    Answer:   CKD stage 3    Order Specific Question:   Preferred imaging location?    Answer:   [facility]    Follow up plan: No follow-ups on file.  Ria Bush, MD

## 2018-06-30 NOTE — Patient Instructions (Addendum)
Change losartan hctz to morning time.  May continue other 2 medicines at night time (amlodipine and atorvastatin). We will order kidney ultrasound.   Chronic Kidney Disease, Adult Chronic kidney disease (CKD) occurs when the kidneys become damaged slowly over a long period of time. The kidneys are a pair of organs that do many important jobs in the body, including:  Removing waste and extra fluid from the blood to make urine.  Making hormones that maintain the amount of fluid in tissues and blood vessels.  Maintaining the right amount of fluids and chemicals in the body. A small amount of kidney damage may not cause problems, but a large amount of damage may make it hard or impossible for the kidneys to work the way they should. If steps are not taken to slow down kidney damage or to stop it from getting worse, the kidneys may stop working permanently (end-stage renal disease or ESRD). Most of the time, CKD does not go away, but it can often be controlled. People who have CKD are usually able to live normal lives. What are the causes? The most common causes of this condition are diabetes and high blood pressure (hypertension). Other causes include:  Heart and blood vessel (cardiovascular) disease.  Kidney diseases, such as: ? Glomerulonephritis. ? Interstitial nephritis. ? Polycystic kidney disease. ? Renal vascular disease.  Diseases that affect the immune system.  Genetic diseases.  Medicines that damage the kidneys, such as anti-inflammatory medicines.  Being around or being in contact with poisonous (toxic) substances.  A kidney or urinary infection that occurs again and again (recurs).  Vasculitis. This is swelling or inflammation of the blood vessels.  A problem with urine flow that may be caused by: ? Cancer. ? Having kidney stones more than one time. ? An enlarged prostate, in males. What increases the risk? You are more likely to develop this condition if you:  Are  older than age 40.  Are female.  Are African-American, Hispanic, Asian, [place], or American [place].  Are a current or former smoker.  Are obese.  Have a family history of kidney disease or failure.  Often take medicines that are damaging to the kidneys. What are the signs or symptoms? Symptoms of this condition include:  Swelling (edema) of the face, legs, ankles, or feet.  Tiredness (lethargy) and having less energy.  Nausea or vomiting.  Confusion or trouble concentrating.  Problems with urination, such as: ? Painful or burning feeling during urination. ? Decreased urine production. ? Frequent urination, especially at night. ? Bloody urine.  Muscle twitches and cramps, especially in the legs.  Shortness of breath.  Weakness.  Loss of appetite.  Metallic taste in the mouth.  Trouble sleeping.  Dry, itchy skin.  A low blood count (anemia).  Pale lining of the eyelids and surface of the eye (conjunctiva). Symptoms develop slowly and may not be obvious until the kidney damage becomes severe. It is possible to have kidney disease for years without having any symptoms. How is this diagnosed? This condition may be diagnosed based on:  Blood tests.  Urine tests.  Imaging tests, such as an ultrasound or CT scan.  A test in which a sample of tissue is removed from the kidneys to be examined under a microscope (kidney biopsy). These test results will help your health care provider determine how serious the CKD is. How is this treated? There is no cure for most cases of this condition, but treatment usually relieves symptoms and  prevents or slows the progression of the disease. Treatment may include:  Making diet changes, which may require you to avoid alcohol, salty foods (sodium), and foods that are high in potassium, calcium, and protein.  Medicines: ? To lower blood pressure. ? To control blood glucose. ? To relieve anemia. ? To relieve  swelling. ? To protect your bones. ? To improve the balance of electrolytes in your blood.  Removing toxic waste from the body through types of dialysis, if the kidneys can no longer do their job (kidney failure).  Managing any other conditions that are causing your CKD or making it worse. Follow these instructions at home: Medicines  Take over-the-counter and prescription medicines only as told by your health care provider. The dose of some medicines that you take may need to be adjusted.  Do not take any new medicines unless approved by your health care provider. Many medicines can worsen your kidney damage.  Do not take any vitamin and mineral supplements unless approved by your health care provider. Many nutritional supplements can worsen your kidney damage. General instructions  Follow your prescribed diet as told by your health care provider.  Do not use any products that contain nicotine or tobacco, such as cigarettes and e-cigarettes. If you need help quitting, ask your health care provider.  Monitor and track your blood pressure at home. Report changes in your blood pressure as told by your health care provider.  If you are being treated for diabetes, monitor and track your blood sugar (blood glucose) levels as told by your health care provider.  Maintain a healthy weight. If you need help with this, ask your health care provider.  Start or continue an exercise plan. Exercise at least 30 minutes a day, 5 days a week.  Keep your immunizations up to date as told by your health care provider.  Keep all follow-up visits as told by your health care provider. This is important. Where to find more information  American Association of Kidney Patients: BombTimer.gl  National Kidney Foundation: www.kidney.[place]: https://mathis.com/  [facility]: www.lifeoptions.org and www.kidneyschool.org Contact a health care provider if:  Your  symptoms get worse.  You develop new symptoms. Get help right away if:  You develop symptoms of ESRD, which include: ? Headaches. ? Numbness in the hands or feet. ? Easy bruising. ? Frequent hiccups. ? Chest pain. ? Shortness of breath. ? Lack of menstruation, in women.  You have a fever.  You have decreased urine production.  You have pain or bleeding when you urinate. Summary  Chronic kidney disease (CKD) occurs when the kidneys become damaged slowly over a long period of time.  The most common causes of this condition are diabetes and high blood pressure (hypertension).  There is no cure for most cases of this condition, but treatment usually relieves symptoms and prevents or slows the progression of the disease. Treatment may include a combination of medicines and lifestyle changes. This information is not intended to replace advice given to you by your health care provider. Make sure you discuss any questions you have with your health care provider. Document Released: 01/20/2008 Document Revised: 05/20/2016 Document Reviewed: 05/20/2016 Elsevier Interactive Patient Education  2019 [employer].

## 2018-06-30 NOTE — Assessment & Plan Note (Signed)
Reviewed with patient. Handout provided. SPEP, PTH, vit D, CBC normal. Anticipate hypertensive nephropathy. Update renal US.

## 2018-06-30 NOTE — Assessment & Plan Note (Signed)
Chronic, stable. Continue current regimen. 

## 2018-07-03 ENCOUNTER — Ambulatory Visit
Admission: RE | Admit: 2018-07-03 | Discharge: 2018-07-03 | Disposition: A | Discharge status: Home / Self Care | Payer: Medicare Other | Source: Ambulatory Visit | Attending: Family Medicine | Admitting: Family Medicine

## 2018-07-03 DIAGNOSIS — N183 Chronic kidney disease, stage 3 unspecified: Secondary | ICD-10-CM

## 2018-07-03 DIAGNOSIS — I10 Essential (primary) hypertension: Secondary | ICD-10-CM

## 2018-07-07 ENCOUNTER — Encounter: Payer: Self-pay | Admitting: Family Medicine

## 2018-07-08 ENCOUNTER — Encounter: Payer: Self-pay | Admitting: Family Medicine

## 2018-07-08 DIAGNOSIS — N281 Cyst of kidney, acquired: Secondary | ICD-10-CM | POA: Insufficient documentation

## 2018-07-08 NOTE — Telephone Encounter (Signed)
Results released via mychart. 

## 2018-07-11 DIAGNOSIS — L570 Actinic keratosis: Secondary | ICD-10-CM | POA: Diagnosis not present

## 2018-07-11 DIAGNOSIS — D2262 Melanocytic nevi of left upper limb, including shoulder: Secondary | ICD-10-CM | POA: Diagnosis not present

## 2018-07-11 DIAGNOSIS — D1801 Hemangioma of skin and subcutaneous tissue: Secondary | ICD-10-CM | POA: Diagnosis not present

## 2018-07-11 DIAGNOSIS — L821 Other seborrheic keratosis: Secondary | ICD-10-CM | POA: Diagnosis not present

## 2018-07-11 DIAGNOSIS — D2261 Melanocytic nevi of right upper limb, including shoulder: Secondary | ICD-10-CM | POA: Diagnosis not present

## 2018-07-11 DIAGNOSIS — Z85828 Personal history of other malignant neoplasm of skin: Secondary | ICD-10-CM | POA: Diagnosis not present

## 2018-07-11 DIAGNOSIS — D225 Melanocytic nevi of trunk: Secondary | ICD-10-CM | POA: Diagnosis not present

## 2018-07-11 DIAGNOSIS — D2272 Melanocytic nevi of left lower limb, including hip: Secondary | ICD-10-CM | POA: Diagnosis not present

## 2018-08-29 ENCOUNTER — Other Ambulatory Visit: Payer: Self-pay | Admitting: Family Medicine

## 2018-09-10 ENCOUNTER — Encounter: Payer: Self-pay | Admitting: Family Medicine

## 2018-09-11 ENCOUNTER — Ambulatory Visit (INDEPENDENT_AMBULATORY_CARE_PROVIDER_SITE_OTHER): Payer: Medicare Other | Admitting: Family Medicine

## 2018-09-11 ENCOUNTER — Encounter: Payer: Self-pay | Admitting: Family Medicine

## 2018-09-11 ENCOUNTER — Other Ambulatory Visit: Payer: Self-pay

## 2018-09-11 VITALS — BP 136/62 | HR 89 | Temp 99.3°F | Ht 61.5 in | Wt 164.5 lb

## 2018-09-11 DIAGNOSIS — R600 Localized edema: Secondary | ICD-10-CM | POA: Diagnosis not present

## 2018-09-11 DIAGNOSIS — N183 Chronic kidney disease, stage 3 unspecified: Secondary | ICD-10-CM

## 2018-09-11 NOTE — Telephone Encounter (Signed)
Spoke with pt, she agrees to come in at 12:30 today.   Will you schedule pt?

## 2018-09-11 NOTE — Assessment & Plan Note (Signed)
Dependent edema worse at end of day, resolves after supine overnight. Anticipate related to sodium intake in h/o CKD stage 3. Reviewed with patient. Doubt med related (amlodipine). rec Na restriction to 1500mg /day. Handout provided. Update if not improving or worsening for updated labs. She agrees with plan.

## 2018-09-11 NOTE — Progress Notes (Signed)
This visit was conducted in person.  BP 136/62 (BP Location: Left Arm, Patient Position: Sitting, Cuff Size: Normal)   Pulse 89   Temp 99.3 F (37.4 C) (Oral)   Ht 5' 1.5" (1.562 m)   Wt 164 lb 8 oz (74.6 kg)   SpO2 94%   BMI 30.58 kg/m    CC: foot/ankle swelling Subjective:    Patient ID: Carrie Lucas, female    DOB: 1940-09-27, 78 y.o.   MRN: [id number]  HPI: Carrie Lucas is a 78 y.o. female presenting on 09/11/2018 for Joint Swelling (C/o bilateral ankle swelling down to the feet. Usually starts later in the day. Started about 2 wks ago. )   Struggling with [phone] isolation. Misses singing group.   See MyChart message. Pedal edema noted over the past several weeks. Actually today not very swollen. Notes no problems in the morning but as day progresses swelling starts worsening. Feet can feel tight.   Denies increased sodium in diet. She does add salt to diet.  Long term amlodipine and hyzaar use.  More inactive recently.   Known CKD3 - levels stable as was renal US earlier in the year. (1.6 R slightly complex renal cyst rec rpt [place] 6 mo).      Relevant past medical, surgical, family and social history reviewed and updated as indicated. Interim medical history since our last visit reviewed. Allergies and medications reviewed and updated. Outpatient Medications Prior to Visit  Medication Sig Dispense Refill  . acetaminophen (TYLENOL) 650 MG CR tablet Take 650 mg by mouth 2 (two) times daily.    Marland Kitchen amLODipine (NORVASC) 5 MG tablet Take 1 tablet by mouth once daily 90 tablet 0  . aspirin EC 81 MG tablet Take 1 tablet (81 mg total) by mouth every Monday, Wednesday, and Friday.    Marland Kitchen atorvastatin (LIPITOR) 20 MG tablet TAKE 1 TABLET BY MOUTH ONCE DAILY 90 tablet 3  . calcium carbonate (OS-CAL) 600 MG TABS tablet Take 1 tablet (600 mg total) by mouth daily with breakfast.    . Cholecalciferol (VITAMIN D) 2000 units CAPS Take 1 capsule by mouth daily.    Marland Kitchen  losartan-hydrochlorothiazide (HYZAAR) 100-12.5 MG tablet TAKE 1 TABLET BY MOUTH ONCE DAILY 90 tablet 3  . Multiple Vitamin (MULTIVITAMIN) tablet Take 1 tablet by mouth daily.     No facility-administered medications prior to visit.      Per HPI unless specifically indicated in ROS section below Review of Systems Objective:    BP 136/62 (BP Location: Left Arm, Patient Position: Sitting, Cuff Size: Normal)   Pulse 89   Temp 99.3 F (37.4 C) (Oral)   Ht 5' 1.5" (1.562 m)   Wt 164 lb 8 oz (74.6 kg)   SpO2 94%   BMI 30.58 kg/m   Wt Readings from Last 3 Encounters:  09/11/18 164 lb 8 oz (74.6 kg)  06/30/18 161 lb 7 oz (73.2 kg)  12/31/17 161 lb 2.5 oz (73.1 kg)    Physical Exam Vitals signs and nursing note reviewed.  Constitutional:      Appearance: Normal appearance. She is not ill-appearing.  HENT:     Mouth/Throat:     Mouth: Mucous membranes are moist.     Pharynx: No posterior oropharyngeal erythema.  Cardiovascular:     Rate and Rhythm: Normal rate and regular rhythm.     Pulses: Normal pulses.     Heart sounds: Normal heart sounds. No murmur.  Pulmonary:     Effort:  Respiratory distress present.     Breath sounds: Normal breath sounds. No wheezing, rhonchi or rales.  Musculoskeletal: Normal range of motion.        General: No swelling.     Right lower leg: No edema.     Left lower leg: No edema.  Skin:    General: Skin is warm and dry.     Findings: No rash.  Neurological:     Mental Status: She is alert.  Psychiatric:        Mood and Affect: Mood normal.        Behavior: Behavior normal.       Results for orders placed or performed in visit on 06/26/18  Parathyroid hormone, intact (no Ca)  Result Value Ref Range   PTH 47 14 - 64 pg/mL  Serum protein electrophoresis with reflex  Result Value Ref Range   Total Protein 7.0 6.1 - 8.1 g/dL   Albumin ELP 4.0 3.8 - 4.8 g/dL   Alpha 1 0.3 0.2 - 0.3 g/dL   Alpha 2 0.9 0.5 - 0.9 g/dL   Beta Globulin 0.5 0.4 -  0.6 g/dL   Beta 2 0.4 0.2 - 0.5 g/dL   Gamma Globulin 1.0 0.8 - 1.7 g/dL   SPE Interp.    Renal function panel  Result Value Ref Range   Sodium 141 135 - 145 mEq/L   Potassium 3.9 3.5 - 5.1 mEq/L   Chloride 104 96 - 112 mEq/L   CO2 28 19 - 32 mEq/L   Calcium 9.8 8.4 - 10.5 mg/dL   Albumin 4.3 3.5 - 5.2 g/dL   BUN 21 6 - 23 mg/dL   Creatinine, Ser 1.16 0.40 - 1.20 mg/dL   Glucose, Bld 94 70 - 99 mg/dL   Phosphorus 3.3 2.3 - 4.6 mg/dL   GFR 45.18 (L) >60.00 mL/min  CBC with Differential/Platelet  Result Value Ref Range   WBC 6.5 4.0 - 10.5 K/uL   RBC 4.04 3.87 - 5.11 Mil/uL   Hemoglobin 12.6 12.0 - 15.0 g/dL   HCT 37.2 36.0 - 46.0 %   MCV 91.9 78.0 - 100.0 fl   MCHC 33.8 30.0 - 36.0 g/dL   RDW 13.4 11.5 - 15.5 %   Platelets 333.0 150.0 - 400.0 K/uL   Neutrophils Relative % 41.0 (L) 43.0 - 77.0 %   Lymphocytes Relative 45.3 12.0 - 46.0 %   Monocytes Relative 10.1 3.0 - 12.0 %   Eosinophils Relative 2.5 0.0 - 5.0 %   Basophils Relative 1.1 0.0 - 3.0 %   Neutro Abs 2.7 1.4 - 7.7 K/uL   Lymphs Abs 3.0 0.7 - 4.0 K/uL   Monocytes Absolute 0.7 0.1 - 1.0 K/uL   Eosinophils Absolute 0.2 0.0 - 0.7 K/uL   Basophils Absolute 0.1 0.0 - 0.1 K/uL  VITAMIN D 25 Hydroxy (Vit-D Deficiency, Fractures)  Result Value Ref Range   VITD 68.15 30.00 - 100.00 ng/mL   Assessment & Plan:   Problem List Items Addressed This Visit    Pedal edema - Primary    Dependent edema worse at end of day, resolves after supine overnight. Anticipate related to sodium intake in h/o CKD stage 3. Reviewed with patient. Doubt med related (amlodipine). rec Na restriction to 1500mg /day. Handout provided. Update if not improving or worsening for updated labs. She agrees with plan.       CKD (chronic kidney disease) stage 3, GFR 30-59 ml/min (HCC)       No orders of  the defined types were placed in this encounter.  No orders of the defined types were placed in this encounter.   Follow up plan: Return if  symptoms worsen or fail to improve.  Ria Bush, MD

## 2018-09-11 NOTE — Telephone Encounter (Signed)
Recommend in office visit for patient with leg swelling.

## 2018-09-11 NOTE — Patient Instructions (Signed)
I think swelling is coming from sodium in diet.  Continue good water intake. Try to limit sodium in diet to 1500mg /day.  Continue current medicines. Let me know if worsening for labwork.   Edema  Edema is an abnormal buildup of fluids in the body tissues and under the skin. Swelling of the legs, feet, and ankles is a common symptom that becomes more likely as you get older. Swelling is also common in looser tissues, like around the eyes. When the affected area is squeezed, the fluid may move out of that spot and leave a dent for a few moments. This dent is called pitting edema. There are many possible causes of edema. Eating too much salt (sodium) and being on your feet or sitting for a long time can cause edema in your legs, feet, and ankles. Hot weather may make edema worse. Common causes of edema include:  Heart failure.  Liver or kidney disease.  Weak leg blood vessels.  Cancer.  An injury.  Pregnancy.  Medicines.  Being obese.  Low protein levels in the blood. Edema is usually painless. Your skin may look swollen or shiny. Follow these instructions at home:  Keep the affected body part raised (elevated) above the level of your heart when you are sitting or lying down.  Do not sit still or stand for long periods of time.  Do not wear tight clothing. Do not wear garters on your upper legs.  Exercise your legs to get your circulation going. This helps to move the fluid back into your blood vessels, and it may help the swelling go down.  Wear elastic bandages or support stockings to reduce swelling as told by your health care provider.  Eat a low-salt (low-sodium) diet to reduce fluid as told by your health care provider.  Depending on the cause of your swelling, you may need to limit how much fluid you drink (fluid restriction).  Take over-the-counter and prescription medicines only as told by your health care provider. Contact a health care provider if:  Your edema  does not get better with treatment.  You have heart, liver, or kidney disease and have symptoms of edema.  You have sudden and unexplained weight gain. Get help right away if:  You develop shortness of breath or chest pain.  You cannot breathe when you lie down.  You develop pain, redness, or warmth in the swollen areas.  You have heart, liver, or kidney disease and suddenly get edema.  You have a fever and your symptoms suddenly get worse. Summary  Edema is an abnormal buildup of fluids in the body tissues and under the skin.  Eating too much salt (sodium) and being on your feet or sitting for a long time can cause edema in your legs, feet, and ankles.  Keep the affected body part raised (elevated) above the level of your heart when you are sitting or lying down. This information is not intended to replace advice given to you by your health care provider. Make sure you discuss any questions you have with your health care provider. Document Released: 04/12/2005 Document Revised: 05/15/2016 Document Reviewed: 05/15/2016 Elsevier Interactive Patient Education  2019 [employer].

## 2018-09-13 ENCOUNTER — Other Ambulatory Visit: Payer: Medicare Other

## 2018-09-13 ENCOUNTER — Ambulatory Visit (INDEPENDENT_AMBULATORY_CARE_PROVIDER_SITE_OTHER): Payer: Medicare Other | Admitting: Family Medicine

## 2018-09-13 ENCOUNTER — Encounter: Payer: Self-pay | Admitting: Family Medicine

## 2018-09-13 VITALS — Temp 98.7°F | Ht 61.5 in | Wt 165.0 lb

## 2018-09-13 DIAGNOSIS — N183 Chronic kidney disease, stage 3 unspecified: Secondary | ICD-10-CM

## 2018-09-13 DIAGNOSIS — R300 Dysuria: Secondary | ICD-10-CM

## 2018-09-13 DIAGNOSIS — N3001 Acute cystitis with hematuria: Secondary | ICD-10-CM | POA: Diagnosis not present

## 2018-09-13 LAB — POC URINALSYSI DIPSTICK (AUTOMATED)
Glucose, UA: NEGATIVE
Ketones, UA: NEGATIVE
Nitrite, UA: POSITIVE
Protein, UA: POSITIVE — AB
Spec Grav, UA: 1.025 (ref 1.010–1.025)
Urobilinogen, UA: 2 E.U./dL — AB
pH, UA: 5 (ref 5.0–8.0)

## 2018-09-13 MED ORDER — CEPHALEXIN 500 MG PO CAPS: 500.0000 mg | ORAL_CAPSULE | Freq: Two times a day (BID) | ORAL | 0 refills | Status: DC

## 2018-09-13 NOTE — Progress Notes (Signed)
Virtual visit completed through [email or web address]. Due to national recommendations of social distancing due to COVID-19, a virtual visit is felt to be most appropriate for this patient at this time.   Patient location: home Provider location: [facility], office If any vitals were documented, they were collected by patient at home unless specified below.    Temp 98.7 F (37.1 C) (Oral)   Ht 5' 1.5" (1.562 m)   Wt 165 lb (74.8 kg)   BMI 30.67 kg/m    CC: UTI? Subjective:    Patient ID: Carrie Lucas, female    DOB: [birth year], 78 y.o.   MRN: [id number]  HPI: Carrie Lucas is a 78 y.o. female presenting on 09/13/2018 for Dysuria (C/o burning with urination and urinary frequency. Says she "wet" herself twice today while out shopping. Sxs started yesterday. Hx of UTIs. )   1d h/o dysuria, urgency and frequency associated with 2 episodes of incontinence while grocery shopping at [place] today. This is first episode of incontinence she's had. Some suprapubic pressure present.   No fevers/chills, nausea/vomiting, flank pain, hematuria.   Has treated with azo tablets. Did not take one this morning.  No recent abx use.      Relevant past medical, surgical, family and social history reviewed and updated as indicated. Interim medical history since our last visit reviewed. Allergies and medications reviewed and updated. Outpatient Medications Prior to Visit  Medication Sig Dispense Refill  . acetaminophen (TYLENOL) 650 MG CR tablet Take 650 mg by mouth 2 (two) times daily.    Marland Kitchen amLODipine (NORVASC) 5 MG tablet Take 1 tablet by mouth once daily 90 tablet 0  . aspirin EC 81 MG tablet Take 1 tablet (81 mg total) by mouth every Monday, Wednesday, and Friday.    Marland Kitchen atorvastatin (LIPITOR) 20 MG tablet TAKE 1 TABLET BY MOUTH ONCE DAILY 90 tablet 3  . calcium carbonate (OS-CAL) 600 MG TABS tablet Take 1 tablet (600 mg total) by mouth daily with breakfast.    . Cholecalciferol  (VITAMIN D) 2000 units CAPS Take 1 capsule by mouth daily.    Marland Kitchen losartan-hydrochlorothiazide (HYZAAR) 100-12.5 MG tablet TAKE 1 TABLET BY MOUTH ONCE DAILY 90 tablet 3  . Multiple Vitamin (MULTIVITAMIN) tablet Take 1 tablet by mouth daily.     No facility-administered medications prior to visit.      Per HPI unless specifically indicated in ROS section below Review of Systems Objective:    Temp 98.7 F (37.1 C) (Oral)   Ht 5' 1.5" (1.562 m)   Wt 165 lb (74.8 kg)   BMI 30.67 kg/m   Wt Readings from Last 3 Encounters:  09/13/18 165 lb (74.8 kg)  09/11/18 164 lb 8 oz (74.6 kg)  06/30/18 161 lb 7 oz (73.2 kg)     Physical exam: Gen: alert, NAD, not ill appearing Pulm: speaks in complete sentences without increased work of breathing Psych: normal mood, normal thought content      Results for orders placed or performed in visit on 09/13/18  POCT Urinalysis Dipstick (Automated)  Result Value Ref Range   Color, UA orange    Clarity, UA cloudy    Glucose, UA Negative Negative   Bilirubin, UA 2+    Ketones, UA negative    Spec Grav, UA 1.025 1.010 - 1.025   Blood, UA 3+    pH, UA 5.0 5.0 - 8.0   Protein, UA Positive (A) Negative   Urobilinogen, UA 2.0 (  A) 0.2 or 1.0 E.U./dL   Nitrite, UA positive    Leukocytes, UA Large (3+) (A) Negative  Not enough urine provided for microscopy, but UCx sent.   Lab Results  Component Value Date   CREATININE 1.16 06/26/2018   BUN 21 06/26/2018   NA 141 06/26/2018   K 3.9 06/26/2018   CL 104 06/26/2018   CO2 28 06/26/2018    Assessment & Plan:   Problem List Items Addressed This Visit    CKD (chronic kidney disease) stage 3, GFR 30-59 ml/min (HCC)   Acute cystitis with hematuria - Primary    Story and UA consistent with acute cystitis. Treat with keflex 500mg  bid 7d course while we await urine culture. Discussed increased water. Pt agrees with plan.       Relevant Orders   Urine Culture    Other Visit Diagnoses    Dysuria        Relevant Orders   POCT Urinalysis Dipstick (Automated) (Completed)       Meds ordered this encounter  Medications  . cephALEXin (KEFLEX) 500 MG capsule    Sig: Take 1 capsule (500 mg total) by mouth 2 (two) times daily.    Dispense:  14 capsule    Refill:  0   Orders Placed This Encounter  Procedures  . Urine Culture  . POCT Urinalysis Dipstick (Automated)    Follow up plan: Return if symptoms worsen or fail to improve.  Ria Bush, MD

## 2018-09-13 NOTE — Assessment & Plan Note (Signed)
Story and UA consistent with acute cystitis. Treat with keflex 500mg  bid 7d course while we await urine culture. Discussed increased water. Pt agrees with plan.

## 2018-09-15 LAB — URINE CULTURE
MICRO NUMBER:: 491917
SPECIMEN QUALITY:: ADEQUATE

## 2018-09-26 ENCOUNTER — Telehealth (INDEPENDENT_AMBULATORY_CARE_PROVIDER_SITE_OTHER): Payer: Medicare Other | Admitting: *Deleted

## 2018-09-26 DIAGNOSIS — R300 Dysuria: Secondary | ICD-10-CM | POA: Diagnosis not present

## 2018-09-26 LAB — POC URINALSYSI DIPSTICK (AUTOMATED)
Bilirubin, UA: NEGATIVE
Glucose, UA: NEGATIVE
Ketones, UA: NEGATIVE
Nitrite, UA: NEGATIVE
Protein, UA: NEGATIVE
Spec Grav, UA: <=1.005 — AB (ref 1.010–1.025)
Urobilinogen, UA: 0.2 E.U./dL
pH, UA: 6.5 (ref 5.0–8.0)

## 2018-09-26 MED ORDER — CEPHALEXIN 500 MG PO CAPS
500.0000 mg | ORAL_CAPSULE | Freq: Two times a day (BID) | ORAL | 0 refills | Status: DC
Start: 2018-09-26 — End: 2019-01-05

## 2018-09-26 NOTE — Telephone Encounter (Signed)
Urine dipped, spun and drawn up for UCX (just in case).  Results of dip entered.

## 2018-09-26 NOTE — Addendum Note (Signed)
Addended by: Brenton Grills on: 07/02/4534 46:80 PM   Modules accepted: Orders

## 2018-09-26 NOTE — Telephone Encounter (Signed)
Noted. Thanks.

## 2018-09-26 NOTE — Telephone Encounter (Signed)
Patient called stating that she was on an antibiotic for a UTI and finished it on 09/20/18. Patient stated that she was fine for a few days after finishing the medication. Patient stated that she started back yesterday with burning when urinating. Patient thinks that she needs another round of medication to clear the infection up. Pharmacy [place]

## 2018-09-26 NOTE — Telephone Encounter (Signed)
Treated with 1wk keflex course, appropriate by UCx. Otherwise resistant to abx.  Ok to do second keflex course sent to pharmacy. Can she bring [place] urine sample for UA/urine culture prior to starting second round of abx?

## 2018-09-26 NOTE — Addendum Note (Signed)
Addended by: Brenton Grills on: 08/29/8125 51:70 PM   Modules accepted: Orders

## 2018-09-26 NOTE — Telephone Encounter (Signed)
Patient called back and stated that she will have her husband stop by and pick up a urine specimen container and bring it to her. Mrs. Carrie Lucas stated that she will have her husband bring the urine specimen back to the office as soon as she can get it done.

## 2018-09-26 NOTE — Telephone Encounter (Signed)
UCX ordered per Dr. Darnell Level.

## 2018-09-26 NOTE — Telephone Encounter (Signed)
Spoke with pt. She is going to call and make an appointment for later on today to give a UA before taking the keflex.

## 2018-09-26 NOTE — Telephone Encounter (Signed)
Plz notify - UA suspicious for recurrent infection - will await UCx, start keflex.

## 2018-09-27 NOTE — Telephone Encounter (Signed)
Left message on vm per dpr relaying Dr. G's message and instructions.  

## 2018-09-28 LAB — URINE CULTURE
MICRO NUMBER:: 528550
SPECIMEN QUALITY:: ADEQUATE

## 2018-12-06 ENCOUNTER — Other Ambulatory Visit: Payer: Self-pay

## 2018-12-06 MED ORDER — AMLODIPINE BESYLATE 5 MG PO TABS: 5.0000 mg | ORAL_TABLET | Freq: Every day | ORAL | 0 refills | Status: DC

## 2018-12-06 NOTE — Telephone Encounter (Signed)
E-scribed refill 

## 2019-01-02 ENCOUNTER — Other Ambulatory Visit (INDEPENDENT_AMBULATORY_CARE_PROVIDER_SITE_OTHER): Payer: Medicare Other

## 2019-01-02 ENCOUNTER — Other Ambulatory Visit: Payer: Self-pay | Admitting: Family Medicine

## 2019-01-02 ENCOUNTER — Ambulatory Visit (INDEPENDENT_AMBULATORY_CARE_PROVIDER_SITE_OTHER): Payer: Medicare Other

## 2019-01-02 ENCOUNTER — Ambulatory Visit: Payer: Medicare Other

## 2019-01-02 VITALS — BP 169/69 | HR 81 | Ht 62.0 in | Wt 163.0 lb

## 2019-01-02 DIAGNOSIS — E782 Mixed hyperlipidemia: Secondary | ICD-10-CM

## 2019-01-02 DIAGNOSIS — N183 Chronic kidney disease, stage 3 unspecified: Secondary | ICD-10-CM

## 2019-01-02 DIAGNOSIS — Z0000 Encounter for general adult medical examination without abnormal findings: Secondary | ICD-10-CM

## 2019-01-02 DIAGNOSIS — K760 Fatty (change of) liver, not elsewhere classified: Secondary | ICD-10-CM

## 2019-01-02 DIAGNOSIS — N281 Cyst of kidney, acquired: Secondary | ICD-10-CM | POA: Diagnosis not present

## 2019-01-02 LAB — COMPREHENSIVE METABOLIC PANEL
ALT: 24 U/L (ref 0–35)
AST: 21 U/L (ref 0–37)
Albumin: 4.2 g/dL (ref 3.5–5.2)
Alkaline Phosphatase: 82 U/L (ref 39–117)
BUN: 24 mg/dL — ABNORMAL HIGH (ref 6–23)
CO2: 26 mEq/L (ref 19–32)
Calcium: 9.6 mg/dL (ref 8.4–10.5)
Chloride: 104 mEq/L (ref 96–112)
Creatinine, Ser: 1.36 mg/dL — ABNORMAL HIGH (ref 0.40–1.20)
GFR: 37.56 mL/min — ABNORMAL LOW (ref >60.00)
Glucose, Bld: 97 mg/dL (ref 70–99)
Potassium: 3.9 mEq/L (ref 3.5–5.1)
Sodium: 140 mEq/L (ref 135–145)
Total Bilirubin: 0.5 mg/dL (ref 0.2–1.2)
Total Protein: 7.2 g/dL (ref 6.0–8.3)

## 2019-01-02 LAB — VITAMIN D 25 HYDROXY (VIT D DEFICIENCY, FRACTURES): VITD: 61.04 ng/mL (ref 30.00–100.00)

## 2019-01-02 LAB — CBC WITH DIFFERENTIAL/PLATELET
Basophils Absolute: 0.1 10*3/uL (ref 0.0–0.1)
Basophils Relative: 1 % (ref 0.0–3.0)
Eosinophils Absolute: 0.3 10*3/uL (ref 0.0–0.7)
Eosinophils Relative: 3.2 % (ref 0.0–5.0)
HCT: 37.5 % (ref 36.0–46.0)
Hemoglobin: 12.4 g/dL (ref 12.0–15.0)
Lymphocytes Relative: 48.7 % — ABNORMAL HIGH (ref 12.0–46.0)
Lymphs Abs: 3.8 10*3/uL (ref 0.7–4.0)
MCHC: 33.1 g/dL (ref 30.0–36.0)
MCV: 94.7 fl (ref 78.0–100.0)
Monocytes Absolute: 0.7 10*3/uL (ref 0.1–1.0)
Monocytes Relative: 8.5 % (ref 3.0–12.0)
Neutro Abs: 3 10*3/uL (ref 1.4–7.7)
Neutrophils Relative %: 38.6 % — ABNORMAL LOW (ref 43.0–77.0)
Platelets: 310 10*3/uL (ref 150.0–400.0)
RBC: 3.96 Mil/uL (ref 3.87–5.11)
RDW: 13.1 % (ref 11.5–15.5)
WBC: 7.9 10*3/uL (ref 4.0–10.5)

## 2019-01-02 LAB — LIPID PANEL
Cholesterol: 121 mg/dL (ref 0–200)
HDL: 32.2 mg/dL — ABNORMAL LOW (ref >39.00)
NonHDL: 88.73
Total CHOL/HDL Ratio: 4
Triglycerides: 218 mg/dL — ABNORMAL HIGH (ref 0.0–149.0)
VLDL: 43.6 mg/dL — ABNORMAL HIGH (ref 0.0–40.0)

## 2019-01-02 LAB — LDL CHOLESTEROL, DIRECT: Direct LDL: 60 mg/dL

## 2019-01-02 NOTE — Addendum Note (Signed)
Addended by: Ria Bush on: 01/02/2019 11:45 PM   Modules accepted: Orders

## 2019-01-02 NOTE — Progress Notes (Signed)
Subjective:   Carrie Lucas is a 78 y.o. female who presents for Medicare Annual (Subsequent) preventive examination.  This visit type was conducted due to national recommendations for restrictions regarding the COVID-19 Pandemic (e.g. social distancing). This format is felt to be most appropriate for this patient at this time. All issues noted in this document were discussed and addressed. No physical exam was performed (except for noted visual exam findings with Video Visits). This patient,Carrie Lucas, has given permission to perform this visit via telephone. Vital signs may be absent or patient reported.  Patient location:  At home  Nurse location:  At home     Review of Systems:  n/a Cardiac Risk Factors include: advanced age (>78men, >84 women);hypertension     Objective:     Vitals: BP (!) 169/69 Comment: per patient  Pulse 81 Comment: per patient  Ht 5\' 2"  (1.575 m) Comment: per patient  Wt 163 lb (73.9 kg) Comment: per patient  BMI 29.81 kg/m   Body mass index is 29.81 kg/m.  Advanced Directives 01/02/2019 12/31/2017 12/27/2017 12/20/2016 09/15/2016 12/18/2015 07/23/2015  Does Patient Have a Medical Advance Directive? Yes Yes Yes Yes No No No  Type of Advance Directive Living will - Living will [place];Living will - - -  Copy of [place] in Chart? - - No - copy requested Yes - - -  Would patient like information on creating a medical advance directive? - - - - Yes (MAU/Ambulatory/Procedural Areas - Information given) No - patient declined information No - patient declined information    Tobacco Social History   Tobacco Use  Smoking Status Never Smoker  Smokeless Tobacco Never Used     Counseling given: Not Answered   Clinical Intake:  Pre-visit preparation completed: Yes  Pain : No/denies pain     Nutritional Status: BMI 25 -29 Overweight Nutritional Risks: None Diabetes: No  How often do you need to have someone  help you when you read instructions, pamphlets, or other written materials from your doctor or pharmacy?: 1 - Never What is the last grade level you completed in school?: some college  Interpreter Needed?: No  Information entered by :: NAllen LPN  Past Medical History:  Diagnosis Date  . Arthritis    R middle finger, chronic arthralgias  . BCC (basal cell carcinoma of skin) 06/2015, 06/2017   supraclavicular, nasal bridge, R lower back - [name]  . Carotid stenosis, asymptomatic 12/16/2014   Mild bilateral 1-39%, consider rpt [place] 1 yr (11/2014)   . Chronic renal disease, stage III ([place])   . Colon polyp   . DDD (degenerative disc disease), lumbar 2014   severe facet degeneration L4/5, mod spinal setnosis, mild foraminal stenosis  . Headache   . Hepatic steatosis 2012   mild by CT  . History of melanoma 2003   face (Dr Luetta Nutting)  . HLD (hyperlipidemia)   . HTN (hypertension)   . IBS (irritable bowel syndrome)   . Insomnia   . LPRD (laryngopharyngeal reflux disease)    Redmond Baseman)  . Osteopenia 2011   DEXA T -1.6 hip, -1.3 forearm (12/2015)  . Sialadenitis 05/2013   acute [facility] Baseman)   Past Surgical History:  Procedure Laterality Date  . ABI  06/2010   WNL  . BACK SURGERY  1995   lumbar  . BILATERAL OOPHORECTOMY  2007   ovarian cysts  . BLADDER SUSPENSION     midurethral sling for SUI  . CARPAL  TUNNEL RELEASE Bilateral 2000  . COLONOSCOPY  8 30 12    TAx1, diverticulosis, rpt 5 yrs ([facility])  . COLONOSCOPY  03/2016   WNL (Outlaw)  . DEXA  2011   mild osteopenia (T -1.2 R femur)  . ESI  2014   L L4 transforaminal  . ESOPHAGOGASTRODUODENOSCOPY  12/24/2010   reflux esophagitis  . MOHS SURGERY  2016   BCC  . RADIAL KERATOTOMY Left   . TONSILLECTOMY  Childhood  . VAGINAL HYSTERECTOMY     bladder drop   Family History  Problem Relation Age of Onset  . Cancer Sister        breast  . CAD Sister   . Pulmonary fibrosis Mother 38  . Diabetes Father   . Hypertension Father   .  Stroke Father 67  . Hyperlipidemia Father   . CAD Sister   . Cancer Sister        breast  . Stroke Sister    Social History   Socioeconomic History  . Marital status: Married    Spouse name: Not on file  . Number of children: Not on file  . Years of education: Not on file  . Highest education level: Not on file  Occupational History  . Occupation: retired  Scientific laboratory technician  . Financial resource strain: Not hard at all  . Food insecurity    Worry: Never true    Inability: Never true  . Transportation needs    Medical: No    Non-medical: No  Tobacco Use  . Smoking status: Never Smoker  . Smokeless tobacco: Never Used  Substance and Sexual Activity  . Alcohol use: No    Alcohol/week: 0.0 standard drinks  . Drug use: No  . Sexual activity: Yes  Lifestyle  . Physical activity    Days per week: 3 days    Minutes per session: 20 min  . Stress: Not at all  Relationships  . Social Herbalist on phone: Not on file    Gets together: Not on file    Attends religious service: Not on file    Active member of club or organization: Not on file    Attends meetings of clubs or organizations: Not on file    Relationship status: Not on file  Other Topics Concern  . 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 Orpah Greek   Derm - Dr. Sherrye Payor   Dentist - Dr. Claudina Lick   ENT - Dr Redmond Baseman    Outpatient Encounter Medications as of 01/02/2019  Medication Sig  . acetaminophen (TYLENOL) 650 MG CR tablet Take 650 mg by mouth 2 (two) times daily.  Marland Kitchen amLODipine (NORVASC) 5 MG tablet Take 1 tablet (5 mg total) by mouth daily.  Marland Kitchen aspirin EC 81 MG tablet Take 1 tablet (81 mg total) by mouth every Monday, Wednesday, and Friday.  Marland Kitchen atorvastatin (LIPITOR) 20 MG tablet TAKE 1 TABLET BY  MOUTH ONCE DAILY  . calcium carbonate (OS-CAL) 600 MG TABS tablet Take 1 tablet (600 mg total) by mouth daily with breakfast.  . Cholecalciferol (VITAMIN D) 2000 units CAPS Take 1 capsule by mouth daily.  Marland Kitchen losartan-hydrochlorothiazide (HYZAAR) 100-12.5 MG tablet TAKE 1 TABLET BY MOUTH ONCE DAILY  .  Multiple Vitamin (MULTIVITAMIN) tablet Take 1 tablet by mouth daily.  . cephALEXin (KEFLEX) 500 MG capsule Take 1 capsule (500 mg total) by mouth 2 (two) times daily. (Patient not taking: Reported on 01/02/2019)   No facility-administered encounter medications on file as of 01/02/2019.     Activities of Daily Living In your present state of health, do you have any difficulty performing the following activities: 01/02/2019  Hearing? Y  Comment low and certain tones  Vision? N  Difficulty concentrating or making decisions? N  Walking or climbing stairs? N  Dressing or bathing? N  Doing errands, shopping? N  Preparing Food and eating ? N  Using the Toilet? N  In the past six months, have you accidently leaked urine? N  Do you have problems with loss of bowel control? N  Managing your Medications? N  Managing your Finances? N  Housekeeping or managing your Housekeeping? N  Some recent data might be hidden    Patient Care Team: Ria Bush, MD as PCP - General (Family Medicine) Luberta Mutter, MD as Consulting Physician (Ophthalmology) Cheri Fowler, MD as Consulting Physician (Obstetrics and Gynecology) Harriett Sine, MD as Consulting Physician (Dermatology)    Assessment:   This is a routine wellness examination for [place].  Exercise Activities and Dietary recommendations Current Exercise Habits: Home exercise routine, Type of exercise: walking, Time (Minutes): 20, Frequency (Times/Week): 3, Weekly Exercise (Minutes/Week): 60  Goals    . DIET - INCREASE WATER INTAKE     Starting 12/27/2017, I will attempt to drink at least 6-8 glasses of water daily.     . Weight (lb) < 200  lb (90.7 kg)     01/02/2019, wants to lose 20 pounds       Fall Risk Fall Risk  01/02/2019 12/27/2017 12/20/2016 12/18/2015 12/16/2014  Falls in the past year? 0 No No Yes No  Comment - - - hx of accidental fall with injury (sprained wrist) -  Number falls in past yr: - - - 1 -  Injury with Fall? - - - Yes -  Risk for fall due to : Medication side effect - - History of fall(s);Impaired balance/gait -  Risk for fall due to: Comment - - - pt reports at times her knee "buckles" without warning -  Follow up Falls evaluation completed;Falls prevention discussed - - Falls evaluation completed -   Is the patient's home free of loose throw rugs in walkways, pet beds, electrical cords, etc?   yes      Grab bars in the bathroom? no      Handrails on the stairs?   yes      Adequate lighting?   yes  Timed Get Up and Go performed: n/a  Depression Screen PHQ 2/9 Scores 01/02/2019 12/27/2017 12/20/2016 12/18/2015  PHQ - 2 Score 0 0 0 0  PHQ- 9 Score 3 0 4 0     Cognitive Function MMSE - Mini Mental State Exam 01/02/2019 12/27/2017 12/20/2016 12/18/2015  Orientation to time 5 5 5 5   Orientation to Place 5 5 5 5   Registration 3 3 3 3   Attention/ Calculation 5 0 0 0  Recall 3 3 3 3   Language- name 2 objects 0 0 0 0  Language- repeat 1 1 1 1   Language- follow 3 step command 0 3 3 3   Language- read & follow direction 0 0 0 0  Write a sentence 0 0 0 0  Copy design 0 0 0 0  Total score  22 20 20 20    Mini Cog  Mini-Cog screen was completed. Maximum score is 22. A value of 0 denotes this part of the MMSE was not completed or the patient failed this part of the Mini-Cog screening.       Immunization History  Administered Date(s) Administered  . Influenza Whole 02/05/2010  . Influenza,inj,Quad PF,6+ Mos 12/12/2013, 05/21/2015, 03/12/2016, 02/17/2017, 01/05/2018  . Pneumococcal Conjugate-13 12/12/2013  . Pneumococcal Polysaccharide-23 08/24/2005  . Td 02/18/2001  . Tdap 05/10/2013  . Zoster 09/11/2013     Qualifies for Shingles Vaccine? yes  Screening Tests Health Maintenance  Topic Date Due  . INFLUENZA VACCINE  11/25/2018  . MAMMOGRAM  04/12/2019  . DTaP/Tdap/Td (2 - Td) 05/11/2023  . TETANUS/TDAP  05/11/2023  . DEXA SCAN  Completed  . PNA vac Low Risk Adult  Completed    Cancer Screenings: Lung: Low Dose CT Chest recommended if Age 77-80 years, 30 pack-year currently smoking OR have quit w/in 15years. Patient does not qualify. Breast:  Up to date on Mammogram? Yes   Up to date of Bone Density/Dexa? Yes Colorectal: not required  Additional Screenings: : Hepatitis C Screening: n/a     Plan:    Patient wants to lose 20 pounds.   I have personally reviewed and noted the following in the patient's chart:   . Medical and social history . Use of alcohol, tobacco or illicit drugs  . Current medications and supplements . Functional ability and status . Nutritional status . Physical activity . Advanced directives . List of other physicians . Hospitalizations, surgeries, and ER visits in previous 12 months . Vitals . Screenings to include cognitive, depression, and falls . Referrals and appointments  In addition, I have reviewed and discussed with patient certain preventive protocols, quality metrics, and best practice recommendations. A written personalized care plan for preventive services as well as general preventive health recommendations were provided to patient.     Kellie Simmering, LPN  [id number]

## 2019-01-02 NOTE — Progress Notes (Addendum)
Will order renal US for f/u.

## 2019-01-02 NOTE — Assessment & Plan Note (Deleted)
Update renal US as due.

## 2019-01-02 NOTE — Patient Instructions (Signed)
Carrie Lucas , Thank you for taking time to come for your Medicare Wellness Visit. I appreciate your ongoing commitment to your health goals. Please review the following plan we discussed and let me know if I can assist you in the future.   Screening recommendations/referrals: Colonoscopy: not required Mammogram: 03/2018 Bone Density: 12/2015 Recommended yearly ophthalmology/optometry visit for glaucoma screening and checkup Recommended yearly dental visit for hygiene and checkup  Vaccinations: Influenza vaccine: 12/2017 Pneumococcal vaccine: 11/2013 Tdap vaccine: 04/2013 Shingles vaccine: discussed    Advanced directives: Please bring a copy of your POA (Power of [place]) and/or Living Will to your next appointment.    Conditions/risks identified: overweight  Next appointment: 01/05/2019 at 10:30   Preventive Care 3 Years and Older, Female Preventive care refers to lifestyle choices and visits with your health care provider that can promote health and wellness. What does preventive care include?  A yearly physical exam. This is also called an annual well check.  Dental exams once or twice a year.  Routine eye exams. Ask your health care provider how often you should have your eyes checked.  Personal lifestyle choices, including:  Daily care of your teeth and gums.  Regular physical activity.  Eating a healthy diet.  Avoiding tobacco and drug use.  Limiting alcohol use.  Practicing safe sex.  Taking low-dose aspirin every day.  Taking vitamin and mineral supplements as recommended by your health care provider. What happens during an annual well check? The services and screenings done by your health care provider during your annual well check will depend on your age, overall health, lifestyle risk factors, and family history of disease. Counseling  Your health care provider may ask you questions about your:  Alcohol use.  Tobacco use.  Drug use.  Emotional  well-being.  Home and relationship well-being.  Sexual activity.  Eating habits.  History of falls.  Memory and ability to understand (cognition).  Work and work Statistician.  Reproductive health. Screening  You may have the following tests or measurements:  Height, weight, and BMI.  Blood pressure.  Lipid and cholesterol levels. These may be checked every 5 years, or more frequently if you are over 57 years old.  Skin check.  Lung cancer screening. You may have this screening every year starting at age 27 if you have a 30-pack-year history of smoking and currently smoke or have quit within the past 15 years.  Fecal occult blood test (FOBT) of the stool. You may have this test every year starting at age 66.  Flexible sigmoidoscopy or colonoscopy. You may have a sigmoidoscopy every 5 years or a colonoscopy every 10 years starting at age 18.  Hepatitis C blood test.  Hepatitis B blood test.  Sexually transmitted disease (STD) testing.  Diabetes screening. This is done by checking your blood sugar (glucose) after you have not eaten for a while (fasting). You may have this done every 1-3 years.  Bone density scan. This is done to screen for osteoporosis. You may have this done starting at age 50.  Mammogram. This may be done every 1-2 years. Talk to your health care provider about how often you should have regular mammograms. Talk with your health care provider about your test results, treatment options, and if necessary, the need for more tests. Vaccines  Your health care provider may recommend certain vaccines, such as:  Influenza vaccine. This is recommended every year.  Tetanus, diphtheria, and acellular pertussis (Tdap, Td) vaccine. You may need a Td  booster every 10 years.  Zoster vaccine. You may need this after age 53.  Pneumococcal 13-valent conjugate (PCV13) vaccine. One dose is recommended after age 27.  Pneumococcal polysaccharide (PPSV23) vaccine. One  dose is recommended after age 21. Talk to your health care provider about which screenings and vaccines you need and how often you need them. This information is not intended to replace advice given to you by your health care provider. Make sure you discuss any questions you have with your health care provider. Document Released: 05/09/2015 Document Revised: 12/31/2015 Document Reviewed: 02/11/2015 Elsevier Interactive Patient Education  2017 [place] Prevention in the Home Falls can cause injuries. They can happen to people of all ages. There are many things you can do to make your home safe and to help prevent falls. What can I do on the outside of my home?  Regularly fix the edges of walkways and driveways and fix any cracks.  Remove anything that might make you trip as you walk through a door, such as a raised step or threshold.  Trim any bushes or trees on the path to your home.  Use bright outdoor lighting.  Clear any walking paths of anything that might make someone trip, such as rocks or tools.  Regularly check to see if handrails are loose or broken. Make sure that both sides of any steps have handrails.  Any raised decks and porches should have guardrails on the edges.  Have any leaves, snow, or ice cleared regularly.  Use sand or salt on walking paths during winter.  Clean up any spills in your garage right away. This includes oil or grease spills. What can I do in the bathroom?  Use night lights.  Install grab bars by the toilet and in the tub and shower. Do not use towel bars as grab bars.  Use non-skid mats or decals in the tub or shower.  If you need to sit down in the shower, use a plastic, non-slip stool.  Keep the floor dry. Clean up any water that spills on the floor as soon as it happens.  Remove soap buildup in the tub or shower regularly.  Attach bath mats securely with double-sided non-slip rug tape.  Do not have throw rugs and other  things on the floor that can make you trip. What can I do in the bedroom?  Use night lights.  Make sure that you have a light by your bed that is easy to reach.  Do not use any sheets or blankets that are too big for your bed. They should not hang down onto the floor.  Have a firm chair that has side arms. You can use this for support while you get dressed.  Do not have throw rugs and other things on the floor that can make you trip. What can I do in the kitchen?  Clean up any spills right away.  Avoid walking on wet floors.  Keep items that you use a lot in easy-to-reach places.  If you need to reach something above you, use a strong step stool that has a grab bar.  Keep electrical cords out of the way.  Do not use floor polish or wax that makes floors slippery. If you must use wax, use non-skid floor wax.  Do not have throw rugs and other things on the floor that can make you trip. What can I do with my stairs?  Do not leave any items on the stairs.  Make sure that there are handrails on both sides of the stairs and use them. Fix handrails that are broken or loose. Make sure that handrails are as long as the stairways.  Check any carpeting to make sure that it is firmly attached to the stairs. Fix any carpet that is loose or worn.  Avoid having throw rugs at the top or bottom of the stairs. If you do have throw rugs, attach them to the floor with carpet tape.  Make sure that you have a light switch at the top of the stairs and the bottom of the stairs. If you do not have them, ask someone to add them for you. What else can I do to help prevent falls?  Wear shoes that:  Do not have high heels.  Have rubber bottoms.  Are comfortable and fit you well.  Are closed at the toe. Do not wear sandals.  If you use a stepladder:  Make sure that it is fully opened. Do not climb a closed stepladder.  Make sure that both sides of the stepladder are locked into place.  Ask  someone to hold it for you, if possible.  Clearly mark and make sure that you can see:  Any grab bars or handrails.  First and last steps.  Where the edge of each step is.  Use tools that help you move around (mobility aids) if they are needed. These include:  Canes.  Walkers.  Scooters.  Crutches.  Turn on the lights when you go into a dark area. Replace any light bulbs as soon as they burn out.  Set up your furniture so you have a clear path. Avoid moving your furniture around.  If any of your floors are uneven, fix them.  If there are any pets around you, be aware of where they are.  Review your medicines with your doctor. Some medicines can make you feel dizzy. This can increase your chance of falling. Ask your doctor what other things that you can do to help prevent falls. This information is not intended to replace advice given to you by your health care provider. Make sure you discuss any questions you have with your health care provider. Document Released: 02/06/2009 Document Revised: 09/18/2015 Document Reviewed: 05/17/2014 Elsevier Interactive Patient Education  2017 [employer].

## 2019-01-02 NOTE — Progress Notes (Signed)
PCP notes:  Health Maintenance:  Flu vaccine due.  Abnormal Screenings:  None  Patient concerns:  Patient is having trouble sleeping. States she had an ultra sound of kidney that should a cyst. Was told to have a follow up one done.  Nurse concerns:  None  Next PCP appt.: 01/05/2019 at 10:30

## 2019-01-05 ENCOUNTER — Other Ambulatory Visit: Payer: Self-pay

## 2019-01-05 ENCOUNTER — Ambulatory Visit (INDEPENDENT_AMBULATORY_CARE_PROVIDER_SITE_OTHER): Payer: Medicare Other | Admitting: Family Medicine

## 2019-01-05 ENCOUNTER — Encounter: Payer: Self-pay | Admitting: Family Medicine

## 2019-01-05 VITALS — BP 140/66 | HR 91 | Temp 97.9°F | Ht 61.5 in | Wt 163.2 lb

## 2019-01-05 DIAGNOSIS — Z7189 Other specified counseling: Secondary | ICD-10-CM | POA: Diagnosis not present

## 2019-01-05 DIAGNOSIS — M8580 Other specified disorders of bone density and structure, unspecified site: Secondary | ICD-10-CM | POA: Diagnosis not present

## 2019-01-05 DIAGNOSIS — E782 Mixed hyperlipidemia: Secondary | ICD-10-CM | POA: Diagnosis not present

## 2019-01-05 DIAGNOSIS — I10 Essential (primary) hypertension: Secondary | ICD-10-CM | POA: Diagnosis not present

## 2019-01-05 DIAGNOSIS — M25511 Pain in right shoulder: Secondary | ICD-10-CM

## 2019-01-05 DIAGNOSIS — N183 Chronic kidney disease, stage 3 unspecified: Secondary | ICD-10-CM

## 2019-01-05 DIAGNOSIS — N281 Cyst of kidney, acquired: Secondary | ICD-10-CM

## 2019-01-05 DIAGNOSIS — I6523 Occlusion and stenosis of bilateral carotid arteries: Secondary | ICD-10-CM | POA: Diagnosis not present

## 2019-01-05 DIAGNOSIS — G8929 Other chronic pain: Secondary | ICD-10-CM | POA: Insufficient documentation

## 2019-01-05 MED ORDER — ATORVASTATIN CALCIUM 20 MG PO TABS: 20.0000 mg | ORAL_TABLET | Freq: Every day | ORAL | 3 refills | Status: DC

## 2019-01-05 MED ORDER — AMLODIPINE BESYLATE 5 MG PO TABS: 5.0000 mg | ORAL_TABLET | Freq: Every day | ORAL | 3 refills | Status: DC

## 2019-01-05 MED ORDER — LOSARTAN POTASSIUM-HCTZ 100-25 MG PO TABS: 1.0000 | ORAL_TABLET | Freq: Every day | ORAL | 3 refills | Status: DC

## 2019-01-05 NOTE — Assessment & Plan Note (Signed)
Chronic, on statin. Triglyceride level deterioration noted - reviewed diet choices to help control triglycerides, encouraged increased fatty fish in diet.  The ASCVD Risk score Mikey Bussing DC Jr., et al., 2013) failed to calculate for the following reasons:   The valid total cholesterol range is 130 to 320 mg/dL

## 2019-01-05 NOTE — Assessment & Plan Note (Signed)
Pending rpt renal US.

## 2019-01-05 NOTE — Assessment & Plan Note (Signed)
Chronic, reports elevated systolics at home - will increase losartan hctz to 100/25mg  full dose. This could contribute to deteriorated kidney function. Reassess at f/u visit.

## 2019-01-05 NOTE — Assessment & Plan Note (Signed)
Deterioration noted. Reviewed with patient. Pt knows to avoid dehydration and nephrotoxic agents. See below for HTN plan.

## 2019-01-05 NOTE — Assessment & Plan Note (Signed)
Advanced directives: Would want Carrie Lucas or daughter to be POA - but form not filled out. Living will scanned 11/2016

## 2019-01-05 NOTE — Assessment & Plan Note (Signed)
Continue calcium, vit D and regular weight bearing exercises.

## 2019-01-05 NOTE — Patient Instructions (Addendum)
Increase losartan hydrochlorothiazide to 100/25mg  daily. New dose at pharmacy.  Continue other medicines.  For R shoulder pain - possible biceps tendonitis - try exercises provided today. May take tylenol for discomfort.  If interested, check with pharmacy about new 2 shot shingles series (shingrix).  Call to schedule flu clinic visit.   Health Maintenance After Age 78 After age 30, you are at a higher risk for certain long-term diseases and infections as well as injuries from falls. Falls are a major cause of broken bones and head injuries in people who are older than age 31. Getting regular preventive care can help to keep you healthy and well. Preventive care includes getting regular testing and making lifestyle changes as recommended by your health care provider. Talk with your health care provider about:  Which screenings and tests you should have. A screening is a test that checks for a disease when you have no symptoms.  A diet and exercise plan that is right for you. What should I know about screenings and tests to prevent falls? Screening and testing are the best ways to find a health problem early. Early diagnosis and treatment give you the best chance of managing medical conditions that are common after age 52. Certain conditions and lifestyle choices may make you more likely to have a fall. Your health care provider may recommend:  Regular vision checks. Poor vision and conditions such as cataracts can make you more likely to have a fall. If you wear glasses, make sure to get your prescription updated if your vision changes.  Medicine review. Work with your health care provider to regularly review all of the medicines you are taking, including over-the-counter medicines. Ask your health care provider about any side effects that may make you more likely to have a fall. Tell your health care provider if any medicines that you take make you feel dizzy or sleepy.  Osteoporosis screening.  Osteoporosis is a condition that causes the bones to get weaker. This can make the bones weak and cause them to break more easily.  Blood pressure screening. Blood pressure changes and medicines to control blood pressure can make you feel dizzy.  Strength and balance checks. Your health care provider may recommend certain tests to check your strength and balance while standing, walking, or changing positions.  Foot health exam. Foot pain and numbness, as well as not wearing proper footwear, can make you more likely to have a fall.  Depression screening. You may be more likely to have a fall if you have a fear of falling, feel emotionally low, or feel unable to do activities that you used to do.  Alcohol use screening. Using too much alcohol can affect your balance and may make you more likely to have a fall. What actions can I take to lower my risk of falls? General instructions  Talk with your health care provider about your risks for falling. Tell your health care provider if: ? You fall. Be sure to tell your health care provider about all falls, even ones that seem minor. ? You feel dizzy, sleepy, or off-balance.  Take over-the-counter and prescription medicines only as told by your health care provider. These include any supplements.  Eat a healthy diet and maintain a healthy weight. A healthy diet includes low-fat dairy products, low-fat (lean) meats, and fiber from whole grains, beans, and lots of fruits and vegetables. Home safety  Remove any tripping hazards, such as rugs, cords, and clutter.  Install safety equipment  such as grab bars in bathrooms and safety rails on stairs.  Keep rooms and walkways well-lit. Activity   Follow a regular exercise program to stay fit. This will help you maintain your balance. Ask your health care provider what types of exercise are appropriate for you.  If you need a cane or walker, use it as recommended by your health care provider.  Wear  supportive shoes that have nonskid soles. Lifestyle  Do not drink alcohol if your health care provider tells you not to drink.  If you drink alcohol, limit how much you have: ? 0-1 drink a day for women. ? 0-2 drinks a day for men.  Be aware of how much alcohol is in your drink. In the [place] one drink equals one typical bottle of beer (12 oz), one-half glass of wine (5 oz), or one shot of hard liquor (1 oz).  Do not use any products that contain nicotine or tobacco, such as cigarettes and e-cigarettes. If you need help quitting, ask your health care provider. Summary  Having a healthy lifestyle and getting preventive care can help to protect your health and wellness after age 19.  Screening and testing are the best way to find a health problem early and help you avoid having a fall. Early diagnosis and treatment give you the best chance for managing medical conditions that are more common for people who are older than age 62.  Falls are a major cause of broken bones and head injuries in people who are older than age 65. Take precautions to prevent a fall at home.  Work with your health care provider to learn what changes you can make to improve your health and wellness and to prevent falls. This information is not intended to replace advice given to you by your health care provider. Make sure you discuss any questions you have with your health care provider. Document Released: 02/23/2017 Document Revised: 08/03/2018 Document Reviewed: 02/23/2017 Elsevier Patient Education  2020 [employer].

## 2019-01-05 NOTE — Assessment & Plan Note (Signed)
Faint L sided bruit. Will continue to monitor. Last [place] 2016.

## 2019-01-05 NOTE — Assessment & Plan Note (Signed)
Exam suggestive of biceps tendonitis - provided with resistance band and stretching exercises from [facility] pt advisor. Update if not improving with treatment.

## 2019-01-05 NOTE — Progress Notes (Signed)
This visit was conducted in person.  BP 140/66 (BP Location: Left Arm, Patient Position: Sitting, Cuff Size: Large)   Pulse 91   Temp 97.9 F (36.6 C) (Temporal)   Ht 5' 1.5" (1.562 m)   Wt 163 lb 4 oz (74 kg)   SpO2 95%   BMI 30.35 kg/m    CC: AMW f/u visit Subjective:    Patient ID: Carrie Lucas, female    DOB: May 16, 1940, 78 y.o.   MRN: [id number]  HPI: BRUCHY SEENEY is a 78 y.o. female presenting on 01/05/2019 for Annual Exam (Prt 2. )   Saw health advisor earlier this week for medicare wellness visit. Note reviewed.    Known CKD3 - levels stable as was renal US earlier in the year. (1.6 R slightly complex renal cyst rec rpt [place] 6 mo) - rpt scheduled for Monday.   She has started checking BP at home - systolic staying 99991111.   Noticing more trouble with R shoulder worse in the bed at night time. Points to lateral shoulder as site of pain. No inciting trauma/injury. Has tried lidocaine patches with benefit.   Preventative: COLONOSCOPY Date:8/2012TAx1, diverticulosis, rpt 5 yrs (Outlaw). COLONOSCOPY 03/2016 - WNL (Outlaw).  Well woman with GYN Dr. Orpah Greek, last saw 2017. S/p hysterectomy and oophorectomy. May not return.  Mammogram 03/2018. +fmhx. DEXA -9/2017DEXA T -1.6 hip, -1.3 forearm-mild osteopenia  Flu yearly  Pneumovax 08/2005. prevnar 11/2013  Tdap 04/2013  zostavax - 08/2013 shingrix - discussed Advanced directives: Would want Fritz Pickerel or daughter to be POA - but form not filled out. Living will scanned 11/2016 Seat belt use discussed.  Sunscreen use discussed. No changing moles on skin. H/o melanoma, sees derm Dr Elvera Lennox yearly.  Never smoker.  No alcohol.  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 Interim medical history since our last visit reviewed. Allergies and medications reviewed and updated. Outpatient Medications Prior to Visit  Medication Sig Dispense Refill  . acetaminophen (TYLENOL) 650 MG CR tablet Take 650 mg by mouth 2 (two) times daily.    Marland Kitchen aspirin EC 81 MG tablet Take 1 tablet (81 mg total) by mouth every Monday, Wednesday, and Friday.    . calcium carbonate (OS-CAL) 600 MG TABS tablet Take 1 tablet (600 mg total) by mouth daily with breakfast.    . Cholecalciferol (VITAMIN D) 2000 units CAPS Take 1 capsule by mouth daily.    . Multiple Vitamin (MULTIVITAMIN) tablet Take 1 tablet by mouth daily.    Marland Kitchen amLODipine (NORVASC) 5 MG tablet Take 1 tablet (5 mg total) by mouth daily. 90 tablet 0  . atorvastatin (LIPITOR) 20 MG tablet TAKE 1 TABLET BY MOUTH ONCE DAILY 90 tablet 3  . losartan-hydrochlorothiazide (HYZAAR) 100-12.5 MG tablet TAKE 1 TABLET BY MOUTH ONCE DAILY 90 tablet 3  . cephALEXin (KEFLEX) 500 MG capsule Take 1 capsule (500 mg total) by mouth 2 (two) times daily. (Patient not taking: Reported on 01/02/2019) 14 capsule 0   No facility-administered medications prior to visit.      Per HPI unless specifically indicated in ROS section below Review of Systems Objective:  BP 140/66 (BP Location: Left Arm, Patient Position: Sitting, Cuff Size: Large)   Pulse 91   Temp 97.9 F (36.6 C) (Temporal)   Ht 5' 1.5" (1.562 m)   Wt 163 lb 4 oz (74 kg)   SpO2 95%   BMI 30.35 kg/m   Wt Readings from Last 3 Encounters:  01/05/19 163 lb 4 oz (74 kg)  01/02/19 163 lb (73.9 kg)  09/13/18 165 lb (74.8 kg)    Physical Exam Vitals signs and nursing note reviewed.  Constitutional:      General: She is not in acute distress.    Appearance: Normal appearance. She is well-developed. She is not ill-appearing.  HENT:     Head: Normocephalic and  atraumatic.     Right Ear: Hearing, tympanic membrane, ear canal and external ear normal.     Left Ear: Hearing, tympanic membrane, ear canal and external ear normal.     Nose: Nose normal.     Mouth/Throat:     Mouth: Mucous membranes are moist.     Pharynx: Uvula midline. No oropharyngeal exudate or posterior oropharyngeal erythema.  Eyes:     General: No scleral icterus.    Extraocular Movements: Extraocular movements intact.     Conjunctiva/sclera: Conjunctivae normal.     Pupils: Pupils are equal, round, and reactive to light.  Neck:     Musculoskeletal: Normal range of motion and neck supple.     Vascular: Carotid bruit (faint L sided) present.  Cardiovascular:     Rate and Rhythm: Normal rate and regular rhythm.     Pulses: Normal pulses.          Radial pulses are 2+ on the right side and 2+ on the left side.     Heart sounds: Normal heart sounds. No murmur.  Pulmonary:     Effort: Pulmonary effort is normal. No respiratory distress.     Breath sounds: Normal breath sounds. No wheezing, rhonchi or rales.  Abdominal:     General: Abdomen is flat. Bowel sounds are normal. There is no distension.     Palpations: Abdomen is soft. There is no mass.     Tenderness: There is no abdominal tenderness. There is no guarding or rebound.     Hernia: No hernia is present.  Musculoskeletal: Normal range of motion.        General: Tenderness present.     Right lower leg: No edema.     Left lower leg: No edema.     Comments:  L shoulder WNL R shoulder exam: No deformity of shoulders on inspection. Tender to palpation of anterior shoulder FROM in abduction and forward flexion. No pain or weakness with testing SITS in ext/int rotation. No pain with empty can sign. + pain with Speed test. No impingement. No pain with rotation of humeral head in [facility] joint.   Lymphadenopathy:     Cervical: No cervical adenopathy.  Skin:    General: Skin is warm and dry.     Findings: No rash.   Neurological:     Mental Status: She is alert and oriented to person, place, and time.     Comments: CN grossly intact, station and gait intact  Psychiatric:        Behavior: Behavior normal.        Thought Content: Thought content normal.        Judgment: Judgment normal.       Results for orders placed or performed in visit on 01/02/19  CBC with Differential/Platelet  Result Value Ref Range   WBC 7.9 4.0 - 10.5 K/uL   RBC 3.96 3.87 - 5.11 Mil/uL   Hemoglobin 12.4 12.0 - 15.0 g/dL   HCT 37.5 36.0 - 46.0 %   MCV 94.7 78.0 - 100.0 fl   MCHC 33.1 30.0 - 36.0 g/dL   RDW 13.1 11.5 - 15.5 %   Platelets 310.0 150.0 - 400.0 K/uL   Neutrophils Relative % 38.6 (L) 43.0 - 77.0 %   Lymphocytes Relative 48.7 (H) 12.0 - 46.0 %   Monocytes Relative 8.5 3.0 - 12.0 %   Eosinophils Relative 3.2 0.0 - 5.0 %   Basophils Relative 1.0 0.0 - 3.0 %   Neutro Abs 3.0 1.4 - 7.7 K/uL   Lymphs Abs 3.8 0.7 - 4.0 K/uL   Monocytes Absolute 0.7 0.1 - 1.0 K/uL   Eosinophils Absolute 0.3 0.0 - 0.7 K/uL   Basophils Absolute 0.1 0.0 - 0.1 K/uL  VITAMIN D 25 Hydroxy (Vit-D Deficiency, Fractures)  Result Value Ref Range   VITD 61.04 30.00 - 100.00 ng/mL  Comprehensive metabolic panel  Result Value Ref Range   Sodium 140 135 - 145 mEq/L   Potassium 3.9 3.5 - 5.1 mEq/L   Chloride 104 96 - 112 mEq/L   CO2 26 19 - 32 mEq/L   Glucose, Bld 97 70 - 99 mg/dL   BUN 24 (H) 6 - 23 mg/dL   Creatinine, Ser 1.36 (H) 0.40 - 1.20 mg/dL   Total Bilirubin 0.5 0.2 - 1.2 mg/dL   Alkaline Phosphatase 82 39 - 117 U/L   AST 21 0 - 37 U/L   ALT 24 0 - 35 U/L   Total Protein 7.2 6.0 - 8.3 g/dL   Albumin 4.2 3.5 - 5.2 g/dL   Calcium 9.6 8.4 - 10.5 mg/dL   GFR 37.56 (L) >60.00 mL/min  Lipid panel  Result Value Ref Range   Cholesterol 121 0 - 200 mg/dL   Triglycerides 218.0 (H) 0.0 - 149.0 mg/dL   HDL 32.20 (L) >39.00 mg/dL   VLDL 43.6 (H) 0.0 - 40.0 mg/dL   Total CHOL/HDL Ratio 4    NonHDL 88.73   LDL cholesterol, direct   Result Value Ref Range   Direct LDL 60.0 mg/dL   Assessment & Plan:   Problem List Items Addressed This Visit    Renal cyst, acquired, right    Pending rpt renal US.       Osteopenia    Continue calcium, vit D and regular weight bearing exercises.       HTN (hypertension)    Chronic, reports elevated systolics at home - will increase losartan hctz to 100/25mg  full dose. This could contribute to deteriorated kidney function. Reassess at f/u visit.       Relevant Medications   losartan-hydrochlorothiazide (HYZAAR) 100-25 MG tablet   amLODipine (NORVASC) 5 MG tablet   atorvastatin (LIPITOR) 20 MG tablet   HLD (hyperlipidemia) (Chronic)    Chronic, on statin. Triglyceride level deterioration noted - reviewed diet choices to help control triglycerides, encouraged increased fatty fish in diet.  The ASCVD Risk score Mikey Bussing DC Jr., et al., 2013) failed to calculate for the following reasons:   The valid total cholesterol range is 130 to 320 mg/dL       Relevant Medications   losartan-hydrochlorothiazide (HYZAAR) 100-25 MG tablet   amLODipine (NORVASC) 5 MG tablet   atorvastatin (LIPITOR) 20 MG tablet   CKD (chronic kidney disease) stage 3, GFR 30-59  ml/min (HCC)    Deterioration noted. Reviewed with patient. Pt knows to avoid dehydration and nephrotoxic agents. See below for HTN plan.        Carotid stenosis, asymptomatic    Faint L sided bruit. Will continue to monitor. Last [place] 2016.       Relevant Medications   losartan-hydrochlorothiazide (HYZAAR) 100-25 MG tablet   amLODipine (NORVASC) 5 MG tablet   atorvastatin (LIPITOR) 20 MG tablet   Advanced care planning/counseling discussion - Primary    Advanced directives: Would want Fritz Pickerel or daughter to be POA - but form not filled out. Living will scanned 11/2016      Acute pain of right shoulder    Exam suggestive of biceps tendonitis - provided with resistance band and stretching exercises from [facility] pt advisor. Update if not  improving with treatment.           Meds ordered this encounter  Medications  . losartan-hydrochlorothiazide (HYZAAR) 100-25 MG tablet    Sig: Take 1 tablet by mouth daily.    Dispense:  90 tablet    Refill:  3  . amLODipine (NORVASC) 5 MG tablet    Sig: Take 1 tablet (5 mg total) by mouth daily.    Dispense:  90 tablet    Refill:  3  . atorvastatin (LIPITOR) 20 MG tablet    Sig: Take 1 tablet (20 mg total) by mouth daily.    Dispense:  90 tablet    Refill:  3   No orders of the defined types were placed in this encounter.   Patient instructions: Increase losartan hydrochlorothiazide to 100/25mg  daily. New dose at pharmacy.  Continue other medicines.  For R shoulder pain - possible biceps tendonitis - try exercises provided today. May take tylenol for discomfort.  If interested, check with pharmacy about new 2 shot shingles series (shingrix).  Call to schedule flu clinic visit.   Follow up plan: Return in about 6 months (around 07/05/2019) for follow up visit.  Ria Bush, MD

## 2019-01-08 ENCOUNTER — Ambulatory Visit
Admission: RE | Admit: 2019-01-08 | Discharge: 2019-01-08 | Disposition: A | Discharge status: Home / Self Care | Payer: Medicare Other | Source: Ambulatory Visit | Attending: Family Medicine | Admitting: Family Medicine

## 2019-01-08 DIAGNOSIS — N183 Chronic kidney disease, stage 3 (moderate): Secondary | ICD-10-CM | POA: Diagnosis not present

## 2019-01-08 DIAGNOSIS — N281 Cyst of kidney, acquired: Secondary | ICD-10-CM | POA: Diagnosis not present

## 2019-01-09 ENCOUNTER — Ambulatory Visit (INDEPENDENT_AMBULATORY_CARE_PROVIDER_SITE_OTHER): Payer: Medicare Other

## 2019-01-09 DIAGNOSIS — Z23 Encounter for immunization: Secondary | ICD-10-CM

## 2019-03-09 ENCOUNTER — Other Ambulatory Visit: Payer: Self-pay | Admitting: Family Medicine

## 2019-03-09 DIAGNOSIS — Z1231 Encounter for screening mammogram for malignant neoplasm of breast: Secondary | ICD-10-CM

## 2019-03-14 DIAGNOSIS — H52202 Unspecified astigmatism, left eye: Secondary | ICD-10-CM | POA: Diagnosis not present

## 2019-03-14 DIAGNOSIS — Z961 Presence of intraocular lens: Secondary | ICD-10-CM | POA: Diagnosis not present

## 2019-03-14 DIAGNOSIS — H11003 Unspecified pterygium of eye, bilateral: Secondary | ICD-10-CM | POA: Diagnosis not present

## 2019-05-03 ENCOUNTER — Other Ambulatory Visit: Payer: Self-pay

## 2019-05-03 ENCOUNTER — Ambulatory Visit
Admission: RE | Admit: 2019-05-03 | Discharge: 2019-05-03 | Disposition: A | Discharge status: Home / Self Care | Payer: Medicare Other | Source: Ambulatory Visit | Attending: Family Medicine | Admitting: Family Medicine

## 2019-05-03 DIAGNOSIS — Z1231 Encounter for screening mammogram for malignant neoplasm of breast: Secondary | ICD-10-CM | POA: Diagnosis not present

## 2019-05-03 LAB — HM MAMMOGRAPHY

## 2019-05-04 ENCOUNTER — Encounter: Payer: Self-pay | Admitting: Family Medicine

## 2019-05-11 ENCOUNTER — Encounter: Payer: Self-pay | Admitting: Family Medicine

## 2019-05-25 ENCOUNTER — Ambulatory Visit: Payer: Medicare Other

## 2019-06-11 ENCOUNTER — Ambulatory Visit: Payer: Medicare Other

## 2019-07-05 ENCOUNTER — Ambulatory Visit (INDEPENDENT_AMBULATORY_CARE_PROVIDER_SITE_OTHER): Payer: Medicare Other | Admitting: Family Medicine

## 2019-07-05 ENCOUNTER — Encounter: Payer: Self-pay | Admitting: Family Medicine

## 2019-07-05 ENCOUNTER — Other Ambulatory Visit: Payer: Self-pay

## 2019-07-05 VITALS — BP 130/62 | HR 83 | Temp 97.7°F | Ht 61.5 in | Wt 159.6 lb

## 2019-07-05 DIAGNOSIS — H547 Unspecified visual loss: Secondary | ICD-10-CM | POA: Diagnosis not present

## 2019-07-05 DIAGNOSIS — I10 Essential (primary) hypertension: Secondary | ICD-10-CM | POA: Diagnosis not present

## 2019-07-05 DIAGNOSIS — E782 Mixed hyperlipidemia: Secondary | ICD-10-CM | POA: Diagnosis not present

## 2019-07-05 DIAGNOSIS — N1832 Chronic kidney disease, stage 3b: Secondary | ICD-10-CM | POA: Diagnosis not present

## 2019-07-05 LAB — RENAL FUNCTION PANEL
Albumin: 4.3 g/dL (ref 3.5–5.2)
BUN: 28 mg/dL — ABNORMAL HIGH (ref 6–23)
CO2: 28 mEq/L (ref 19–32)
Calcium: 9.9 mg/dL (ref 8.4–10.5)
Chloride: 103 mEq/L (ref 96–112)
Creatinine, Ser: 1.43 mg/dL — ABNORMAL HIGH (ref 0.40–1.20)
GFR: 35.4 mL/min — ABNORMAL LOW (ref >60.00)
Glucose, Bld: 100 mg/dL — ABNORMAL HIGH (ref 70–99)
Phosphorus: 3.2 mg/dL (ref 2.3–4.6)
Potassium: 3.8 mEq/L (ref 3.5–5.1)
Sodium: 141 mEq/L (ref 135–145)

## 2019-07-05 LAB — LIPID PANEL
Cholesterol: 128 mg/dL (ref 0–200)
HDL: 38.4 mg/dL — ABNORMAL LOW (ref >39.00)
NonHDL: 89.74
Total CHOL/HDL Ratio: 3
Triglycerides: 204 mg/dL — ABNORMAL HIGH (ref 0.0–149.0)
VLDL: 40.8 mg/dL — ABNORMAL HIGH (ref 0.0–40.0)

## 2019-07-05 LAB — POC URINALSYSI DIPSTICK (AUTOMATED)
Bilirubin, UA: NEGATIVE
Blood, UA: NEGATIVE
Glucose, UA: NEGATIVE
Ketones, UA: NEGATIVE
Leukocytes, UA: NEGATIVE
Nitrite, UA: NEGATIVE
Protein, UA: NEGATIVE
Spec Grav, UA: 1.02 (ref 1.010–1.025)
Urobilinogen, UA: 0.2 E.U./dL
pH, UA: 6.5 (ref 5.0–8.0)

## 2019-07-05 LAB — LDL CHOLESTEROL, DIRECT: Direct LDL: 62 mg/dL

## 2019-07-05 NOTE — Assessment & Plan Note (Signed)
Update FLP.  The ASCVD Risk score Mikey Bussing DC Jr., et al., 2013) failed to calculate for the following reasons:   The valid total cholesterol range is 130 to 320 mg/dL

## 2019-07-05 NOTE — Assessment & Plan Note (Signed)
Notes fleeting trouble with focusing vision, predominantly when in car in the bright sun. benign exam today. ?eye strain from bright sun. She will buy new quality sunglasses and if ongoing trouble, encouraged f/u with eye doctor.

## 2019-07-05 NOTE — Assessment & Plan Note (Signed)
Chronic, improved. Continue current regimen. Goal BP <135/85 in CKD.

## 2019-07-05 NOTE — Progress Notes (Signed)
This visit was conducted in person.  BP 130/62 (BP Location: Left Arm, Patient Position: Sitting, Cuff Size: Normal)   Pulse 83   Temp 97.7 F (36.5 C) (Temporal)   Ht 5' 1.5" (1.562 m)   Wt 159 lb 9 oz (72.4 kg)   SpO2 94%   BMI 29.66 kg/m    CC: 6 mo f/u visit Subjective:    Patient ID: Carrie Lucas, female    DOB: 1940-12-30, 79 y.o.   MRN: [id number]  HPI: Carrie Lucas is a 79 y.o. female presenting on 07/05/2019 for Follow-up (Here for 6 mo f/u.)   Strong fmhx stroke and diabetes.   HTN - Compliant with current antihypertensive regimen of losartan hctz 100/25mg  daily and amlodipine 5mg  daily. Does check blood pressures at home: much better control. No low blood pressure readings or symptoms of dizziness/syncope. Denies HA, vision changes, CP/tightness, SOB, leg swelling.    HLD - worse triglycerides last visit - despite daily atorvastatin 20mg .   CKD - see plan.   Sometimes notices her eyes don't focus. Mainly happens when out in bright sunlight. Associated with posterior headache. This can last 30-45 seconds. Has even had to pull over when driving. No photopsia, floaters, blurry or cloudy vision, double vision, loss of vision. Had recent reassuring eye exam. Has known pterygiums, had radial keratotomy remotely.      Relevant past medical, surgical, family and social history reviewed and updated as indicated. Interim medical history since our last visit reviewed. Allergies and medications reviewed and updated. Outpatient Medications Prior to Visit  Medication Sig Dispense Refill  . acetaminophen (TYLENOL) 650 MG CR tablet Take 650 mg by mouth 2 (two) times daily.    Marland Kitchen amLODipine (NORVASC) 5 MG tablet Take 1 tablet (5 mg total) by mouth daily. 90 tablet 3  . aspirin EC 81 MG tablet Take 1 tablet (81 mg total) by mouth every Monday, Wednesday, and Friday.    Marland Kitchen atorvastatin (LIPITOR) 20 MG tablet Take 1 tablet (20 mg total) by mouth daily. 90 tablet 3  . calcium carbonate  (OS-CAL) 600 MG TABS tablet Take 1 tablet (600 mg total) by mouth daily with breakfast.    . Cholecalciferol (VITAMIN D) 2000 units CAPS Take 1 capsule by mouth daily.    Marland Kitchen losartan-hydrochlorothiazide (HYZAAR) 100-25 MG tablet Take 1 tablet by mouth daily. 90 tablet 3  . Multiple Vitamin (MULTIVITAMIN) tablet Take 1 tablet by mouth daily.     No facility-administered medications prior to visit.     Per HPI unless specifically indicated in ROS section below Review of Systems Objective:    BP 130/62 (BP Location: Left Arm, Patient Position: Sitting, Cuff Size: Normal)   Pulse 83   Temp 97.7 F (36.5 C) (Temporal)   Ht 5' 1.5" (1.562 m)   Wt 159 lb 9 oz (72.4 kg)   SpO2 94%   BMI 29.66 kg/m   Wt Readings from Last 3 Encounters:  07/05/19 159 lb 9 oz (72.4 kg)  01/05/19 163 lb 4 oz (74 kg)  01/02/19 163 lb (73.9 kg)    Physical Exam Vitals and nursing note reviewed.  Constitutional:      General: She is not in acute distress.    Appearance: Normal appearance. She is well-developed. She is not ill-appearing.  HENT:     Head: Normocephalic and atraumatic.  Eyes:     General: No scleral icterus.    Extraocular Movements: Extraocular movements intact.     Conjunctiva/sclera:  Conjunctivae normal.     Pupils: Pupils are equal, round, and reactive to light.  Cardiovascular:     Rate and Rhythm: Normal rate and regular rhythm.     Pulses: Normal pulses.     Heart sounds: Normal heart sounds. No murmur.  Pulmonary:     Effort: Pulmonary effort is normal. No respiratory distress.     Breath sounds: Normal breath sounds. No wheezing, rhonchi or rales.  Musculoskeletal:     Right lower leg: No edema.     Left lower leg: No edema.  Skin:    General: Skin is warm and dry.     Findings: No rash.  Neurological:     Mental Status: She is alert.  Psychiatric:        Mood and Affect: Mood normal.        Behavior: Behavior normal.       Results for orders placed or performed in  visit on 05/04/19  HM MAMMOGRAPHY  Result Value Ref Range   HM Mammogram 0-4 Bi-Rad 0-4 Bi-Rad, Self Reported Normal   Lab Results  Component Value Date   CREATININE 1.36 (H) 01/02/2019   BUN 24 (H) 01/02/2019   NA 140 01/02/2019   K 3.9 01/02/2019   CL 104 01/02/2019   CO2 26 01/02/2019    Assessment & Plan:  This visit occurred during the SARS-CoV-2 public health emergency.  Safety protocols were in place, including screening questions prior to the visit, additional usage of staff PPE, and extensive cleaning of exam room while observing appropriate contact time as indicated for disinfecting solutions.   Problem List Items Addressed This Visit    Vision problem    Notes fleeting trouble with focusing vision, predominantly when in car in the bright sun. benign exam today. ?eye strain from bright sun. She will buy new quality sunglasses and if ongoing trouble, encouraged f/u with eye doctor.       HTN (hypertension) - Primary    Chronic, improved. Continue current regimen. Goal BP <135/85 in CKD.       HLD (hyperlipidemia) (Chronic)    Update FLP.  The ASCVD Risk score Mikey Bussing DC Jr., et al., 2013) failed to calculate for the following reasons:   The valid total cholesterol range is 130 to 320 mg/dL       Relevant Orders   Lipid panel   CKD (chronic kidney disease) stage 3, GFR 30-59 ml/min (HCC)    Reviewed diagnosis with patient.  Update labs. Check UA.       Relevant Orders   Renal function panel   Parathyroid hormone, intact (no Ca)       No orders of the defined types were placed in this encounter.  Orders Placed This Encounter  Procedures  . Lipid panel  . Renal function panel  . Parathyroid hormone, intact (no Ca)    Patient Instructions  Labs today  Urinalysis today  Continue current medicines.  Get new pair of sunglasses -if ongoing trouble, check in with eye doctor for vision focusing trouble   Follow up plan: Return in about 6 months (around  01/05/2020) for medicare wellness visit, follow up visit.  Ria Bush, MD

## 2019-07-05 NOTE — Assessment & Plan Note (Signed)
Reviewed diagnosis with patient.  Update labs. Check UA.

## 2019-07-05 NOTE — Addendum Note (Signed)
Addended by: Brenton Grills on: [id number] AM   Modules accepted: Orders

## 2019-07-05 NOTE — Patient Instructions (Addendum)
Labs today  Urinalysis today  Continue current medicines.  Get new pair of sunglasses -if ongoing trouble, check in with eye doctor for vision focusing trouble

## 2019-07-06 LAB — PARATHYROID HORMONE, INTACT (NO CA): PTH: 25 pg/mL (ref 14–64)

## 2019-07-10 ENCOUNTER — Encounter: Payer: Self-pay | Admitting: Family Medicine

## 2019-07-17 ENCOUNTER — Encounter: Payer: Self-pay | Admitting: Family Medicine

## 2019-07-18 MED ORDER — AMLODIPINE BESYLATE 10 MG PO TABS: 10.0000 mg | ORAL_TABLET | Freq: Every day | ORAL | 3 refills | Status: DC

## 2019-07-18 NOTE — Telephone Encounter (Signed)
See other mychart message.

## 2019-07-20 DIAGNOSIS — Z85820 Personal history of malignant melanoma of skin: Secondary | ICD-10-CM | POA: Diagnosis not present

## 2019-07-20 DIAGNOSIS — D225 Melanocytic nevi of trunk: Secondary | ICD-10-CM | POA: Diagnosis not present

## 2019-07-20 DIAGNOSIS — L821 Other seborrheic keratosis: Secondary | ICD-10-CM | POA: Diagnosis not present

## 2019-07-20 DIAGNOSIS — L814 Other melanin hyperpigmentation: Secondary | ICD-10-CM | POA: Diagnosis not present

## 2019-07-20 DIAGNOSIS — Z85828 Personal history of other malignant neoplasm of skin: Secondary | ICD-10-CM | POA: Diagnosis not present

## 2019-07-20 DIAGNOSIS — D1801 Hemangioma of skin and subcutaneous tissue: Secondary | ICD-10-CM | POA: Diagnosis not present

## 2019-07-20 DIAGNOSIS — L820 Inflamed seborrheic keratosis: Secondary | ICD-10-CM | POA: Diagnosis not present

## 2019-07-23 ENCOUNTER — Ambulatory Visit (INDEPENDENT_AMBULATORY_CARE_PROVIDER_SITE_OTHER): Payer: Medicare Other | Admitting: Family Medicine

## 2019-07-23 ENCOUNTER — Other Ambulatory Visit: Payer: Self-pay

## 2019-07-23 ENCOUNTER — Encounter: Payer: Self-pay | Admitting: Family Medicine

## 2019-07-23 VITALS — BP 136/58 | HR 78 | Temp 97.1°F | Ht 61.5 in | Wt 158.6 lb

## 2019-07-23 DIAGNOSIS — R42 Dizziness and giddiness: Secondary | ICD-10-CM | POA: Insufficient documentation

## 2019-07-23 DIAGNOSIS — I10 Essential (primary) hypertension: Secondary | ICD-10-CM | POA: Diagnosis not present

## 2019-07-23 MED ORDER — AMLODIPINE BESYLATE 5 MG PO TABS: 5.0000 mg | ORAL_TABLET | Freq: Every day | ORAL | 3 refills | Status: DC

## 2019-07-23 NOTE — Patient Instructions (Signed)
Decrease amlodipine to 5mg  daily - prior dose.  I think the higher dose contributed to dizzy feeling. Increase water, fluid intake.  Return tomorrow at noon for EKG and BP check.

## 2019-07-23 NOTE — Assessment & Plan Note (Signed)
Persistently elevated readings at home. Has been checking randomly throughout the day. Reviewed correct procedure to check BP - waiting 30 min between meals, caffeine, alcohol, exercise, pain, upset, anxious etc as all these things can artificially increase blood pressure readings. Reassess at f/u visit.

## 2019-07-23 NOTE — Progress Notes (Signed)
This visit was conducted in person.  BP (!) 136/58   Pulse 78   Temp (!) 97.1 F (36.2 C) (Temporal)   Ht 5' 1.5" (1.562 m)   Wt 158 lb 9 oz (71.9 kg)   SpO2 98%   BMI 29.47 kg/m   Orthostatic VS for the past 24 hrs (Last 3 readings):  BP- Lying BP- Standing at 0 minutes  07/23/19 1308 -- 164/68  07/23/19 1306 160/64 --  On repeat - 154/60. BP Readings from Last 3 Encounters:  07/23/19 (!) 136/58  07/05/19 130/62  01/05/19 140/66    CC: dizziness Subjective:    Patient ID: Carrie Lucas, female    DOB: Mar 08, 1941, 79 y.o.   MRN: [id number]  HPI: Carrie Lucas is a 79 y.o. female presenting on 07/23/2019 for Dizziness (C/o dizziness.  Started 07/21/19.  Says dizzy spells last quite awhile. )   Walk in patient - several days of dizziness that may have started when we increased amlodipine to 10mg  daily (3/24). She has started checking BP at home twice daily - with wide fluctuations: 0000000 systolic, diastolics tend to stay low (60s). Intermittent episodes of lightheadedness and clammy feeling. This is worrying her.   No vertigo or syncope. No dyspnea or chest pain. No back pain, palpitations.  She has 2 BP cuffs at home - Relion and MedLine. Varying readings on them.  She has been keeping BP log but forgot to bring in today.   Strong fmhx stroke, diabetes.      Relevant past medical, surgical, family and social history reviewed and updated as indicated. Interim medical history since our last visit reviewed. Allergies and medications reviewed and updated. Outpatient Medications Prior to Visit  Medication Sig Dispense Refill  . acetaminophen (TYLENOL) 650 MG CR tablet Take 650 mg by mouth 2 (two) times daily.    Marland Kitchen aspirin EC 81 MG tablet Take 1 tablet (81 mg total) by mouth every Monday, Wednesday, and Friday.    Marland Kitchen atorvastatin (LIPITOR) 20 MG tablet Take 1 tablet (20 mg total) by mouth daily. 90 tablet 3  . calcium carbonate (OS-CAL) 600 MG TABS tablet Take 1 tablet (600 mg  total) by mouth daily with breakfast.    . Cholecalciferol (VITAMIN D) 2000 units CAPS Take 1 capsule by mouth daily.    Marland Kitchen losartan-hydrochlorothiazide (HYZAAR) 100-25 MG tablet Take 1 tablet by mouth daily. 90 tablet 3  . Multiple Vitamin (MULTIVITAMIN) tablet Take 1 tablet by mouth daily.    Marland Kitchen amLODipine (NORVASC) 10 MG tablet Take 1 tablet (10 mg total) by mouth daily. 90 tablet 3   No facility-administered medications prior to visit.     Per HPI unless specifically indicated in ROS section below Review of Systems Objective:    BP (!) 136/58   Pulse 78   Temp (!) 97.1 F (36.2 C) (Temporal)   Ht 5' 1.5" (1.562 m)   Wt 158 lb 9 oz (71.9 kg)   SpO2 98%   BMI 29.47 kg/m   Wt Readings from Last 3 Encounters:  07/23/19 158 lb 9 oz (71.9 kg)  07/05/19 159 lb 9 oz (72.4 kg)  01/05/19 163 lb 4 oz (74 kg)    Physical Exam Vitals and nursing note reviewed.  Constitutional:      Appearance: Normal appearance. She is not ill-appearing.  Cardiovascular:     Rate and Rhythm: Normal rate and regular rhythm.     Pulses: Normal pulses.     Heart sounds:  Normal heart sounds. No murmur.  Pulmonary:     Effort: Pulmonary effort is normal. No respiratory distress.     Breath sounds: Normal breath sounds. No wheezing, rhonchi or rales.  Musculoskeletal:     Right lower leg: No edema.     Left lower leg: No edema.  Neurological:     Mental Status: She is alert.  Psychiatric:        Mood and Affect: Mood normal.        Behavior: Behavior normal.       Lab Results  Component Value Date   CREATININE 1.43 (H) 07/05/2019   BUN 28 (H) 07/05/2019   NA 141 07/05/2019   K 3.8 07/05/2019   CL 103 07/05/2019   CO2 28 07/05/2019    Assessment & Plan:  This visit occurred during the SARS-CoV-2 public health emergency.  Safety protocols were in place, including screening questions prior to the visit, additional usage of staff PPE, and extensive cleaning of exam room while observing  appropriate contact time as indicated for disinfecting solutions.   Problem List Items Addressed This Visit    HTN (hypertension)    Persistently elevated readings at home. Has been checking randomly throughout the day. Reviewed correct procedure to check BP - waiting 30 min between meals, caffeine, alcohol, exercise, pain, upset, anxious etc as all these things can artificially increase blood pressure readings. Reassess at f/u visit.       Relevant Medications   amLODipine (NORVASC) 5 MG tablet   Dizziness    Describes orthostatic lightheadedness over the past few days which temporally correlates to recent increase in amlodipine - will decrease back to 5mg  dose, increase water intake, rec return in the short interim with her BP log, home BP cuff to ensure accuracy, and she will also return for EKG (did not have time for this today). Pt agrees with plan. Orthostatic vital signs negative today.           Meds ordered this encounter  Medications  . amLODipine (NORVASC) 5 MG tablet    Sig: Take 1 tablet (5 mg total) by mouth daily.    Dispense:  90 tablet    Refill:  3    Returning to previous dose   No orders of the defined types were placed in this encounter.  Patient Instructions  Decrease amlodipine to 5mg  daily - prior dose.  I think the higher dose contributed to dizzy feeling. Increase water, fluid intake.  Return tomorrow at noon for EKG and BP check.    Follow up plan: No follow-ups on file.  Ria Bush, MD

## 2019-07-23 NOTE — Assessment & Plan Note (Addendum)
Describes orthostatic lightheadedness over the past few days which temporally correlates to recent increase in amlodipine - will decrease back to 5mg  dose, increase water intake, rec return in the short interim with her BP log, home BP cuff to ensure accuracy, and she will also return for EKG (did not have time for this today). Pt agrees with plan. Orthostatic vital signs negative today.

## 2019-07-24 ENCOUNTER — Ambulatory Visit (INDEPENDENT_AMBULATORY_CARE_PROVIDER_SITE_OTHER): Payer: Medicare Other | Admitting: Family Medicine

## 2019-07-24 ENCOUNTER — Encounter: Payer: Self-pay | Admitting: Family Medicine

## 2019-07-24 ENCOUNTER — Other Ambulatory Visit: Payer: Self-pay

## 2019-07-24 VITALS — BP 132/54 | HR 76 | Temp 97.8°F | Ht 61.5 in | Wt 157.4 lb

## 2019-07-24 DIAGNOSIS — I10 Essential (primary) hypertension: Secondary | ICD-10-CM

## 2019-07-24 DIAGNOSIS — R42 Dizziness and giddiness: Secondary | ICD-10-CM

## 2019-07-24 NOTE — Assessment & Plan Note (Addendum)
Evidence of falsely high BP readings using home cuff when compared to our in-office readings. I suggested she buy new BP cuff. Continue current regimen of losartan hctz 100/25mg  daily in am and amlodipine 5mg  at night time. Anticipate higher amlodipine led to dizzy spells.  Continue to monitor at home, continue good hydration status.  EKG WNL today.

## 2019-07-24 NOTE — Assessment & Plan Note (Signed)
Anticipate improved orthostasis with lower amlodipine dose.

## 2019-07-24 NOTE — Patient Instructions (Signed)
Good to see you today EKG looked ok. Continue current medicines - losartan hctz 100/25mg  in am, amlodipine 5mg  in pm.  Consider new BP cuff as home cuff is running 10-20 points high.

## 2019-07-24 NOTE — Progress Notes (Signed)
This visit was conducted in person.  BP (!) 132/54 (BP Location: Right Arm, Patient Position: Sitting, Cuff Size: Large)   Pulse 76   Temp 97.8 F (36.6 C) (Temporal)   Ht 5' 1.5" (1.562 m)   Wt 157 lb 7 oz (71.4 kg)   SpO2 94%   BMI 29.27 kg/m   Home BP cuff: 149/71 On recheck in office: 136/54. With home BP cuff: 147/74.  CC: BP check Subjective:    Patient ID: Carrie Lucas, female    DOB: [birth year], 79 y.o.   MRN: [id number]  HPI: Carrie Lucas is a 79 y.o. female presenting on 07/24/2019 for Hypertension (Here for f/u.  BP first thing this AM, 144/71.  Pt brought in home BP monitor to compare, reading- 149/71.)   See yesterday's note for details.  Seen here with dizzy episodes temporally related to increase in amlodipine to 10mg  daily - so we decreased back to 5mg  daily, encouraged good hydration status.   She continues losartan hctz 100/25mg  daily in am, amlodipine 5mg  at night time.  No HA, vision changes, CP/tightness, SOB, leg swelling.  No dizzy spells since yesterday.      Relevant past medical, surgical, family and social history reviewed and updated as indicated. Interim medical history since our last visit reviewed. Allergies and medications reviewed and updated. Outpatient Medications Prior to Visit  Medication Sig Dispense Refill  . acetaminophen (TYLENOL) 650 MG CR tablet Take 650 mg by mouth 2 (two) times daily.    Marland Kitchen amLODipine (NORVASC) 5 MG tablet Take 1 tablet (5 mg total) by mouth daily. 90 tablet 3  . aspirin EC 81 MG tablet Take 1 tablet (81 mg total) by mouth every Monday, Wednesday, and Friday.    Marland Kitchen atorvastatin (LIPITOR) 20 MG tablet Take 1 tablet (20 mg total) by mouth daily. 90 tablet 3  . calcium carbonate (OS-CAL) 600 MG TABS tablet Take 1 tablet (600 mg total) by mouth daily with breakfast.    . Cholecalciferol (VITAMIN D) 2000 units CAPS Take 1 capsule by mouth daily.    Marland Kitchen losartan-hydrochlorothiazide (HYZAAR) 100-25 MG tablet Take 1 tablet  by mouth daily. 90 tablet 3  . Multiple Vitamin (MULTIVITAMIN) tablet Take 1 tablet by mouth daily.     No facility-administered medications prior to visit.     Per HPI unless specifically indicated in ROS section below Review of Systems Objective:    BP (!) 132/54 (BP Location: Right Arm, Patient Position: Sitting, Cuff Size: Large)   Pulse 76   Temp 97.8 F (36.6 C) (Temporal)   Ht 5' 1.5" (1.562 m)   Wt 157 lb 7 oz (71.4 kg)   SpO2 94%   BMI 29.27 kg/m   Wt Readings from Last 3 Encounters:  07/24/19 157 lb 7 oz (71.4 kg)  07/23/19 158 lb 9 oz (71.9 kg)  07/05/19 159 lb 9 oz (72.4 kg)    Physical Exam Vitals and nursing note reviewed.  Constitutional:      Appearance: Normal appearance. She is not ill-appearing.  Eyes:     Extraocular Movements: Extraocular movements intact.     Pupils: Pupils are equal, round, and reactive to light.  Neck:     Thyroid: No thyromegaly or thyroid tenderness.  Cardiovascular:     Rate and Rhythm: Normal rate and regular rhythm.     Pulses: Normal pulses.     Heart sounds: Normal heart sounds. No murmur.  Pulmonary:     Effort: Pulmonary effort  is normal. No respiratory distress.     Breath sounds: Normal breath sounds. No wheezing, rhonchi or rales.  Musculoskeletal:     Right lower leg: No edema.     Left lower leg: No edema.  Neurological:     Mental Status: She is alert.  Psychiatric:        Mood and Affect: Mood normal.        Behavior: Behavior normal.       Lab Results  Component Value Date   CREATININE 1.43 (H) 07/05/2019   BUN 28 (H) 07/05/2019   NA 141 07/05/2019   K 3.8 07/05/2019   CL 103 07/05/2019   CO2 28 07/05/2019    EKG - NSR rate 75, normal axis, intervals, no acute ST/T changes.  Assessment & Plan:  This visit occurred during the SARS-CoV-2 public health emergency.  Safety protocols were in place, including screening questions prior to the visit, additional usage of staff PPE, and extensive cleaning of  exam room while observing appropriate contact time as indicated for disinfecting solutions.   Problem List Items Addressed This Visit    HTN (hypertension) - Primary    Evidence of falsely high BP readings using home cuff when compared to our in-office readings. I suggested she buy new BP cuff. Continue current regimen of losartan hctz 100/25mg  daily in am and amlodipine 5mg  at night time. Anticipate higher amlodipine led to dizzy spells.  Continue to monitor at home, continue good hydration status.  EKG WNL today.       Relevant Orders   EKG 12-Lead (Completed)   Dizziness    Anticipate improved orthostasis with lower amlodipine dose.           No orders of the defined types were placed in this encounter.  Orders Placed This Encounter  Procedures  . EKG 12-Lead   Patient Instructions  Good to see you today EKG looked ok. Continue current medicines - losartan hctz 100/25mg  in am, amlodipine 5mg  in pm.  Consider new BP cuff as home cuff is running 10-20 points high.    Follow up plan: Return if symptoms worsen or fail to improve.  Ria Bush, MD

## 2019-09-17 ENCOUNTER — Encounter: Payer: Self-pay | Admitting: Family Medicine

## 2019-09-28 ENCOUNTER — Encounter: Payer: Self-pay | Admitting: Family Medicine

## 2019-09-28 ENCOUNTER — Ambulatory Visit: Payer: Medicare Other | Admitting: Family Medicine

## 2019-09-28 ENCOUNTER — Ambulatory Visit (INDEPENDENT_AMBULATORY_CARE_PROVIDER_SITE_OTHER): Payer: Medicare Other | Admitting: Family Medicine

## 2019-09-28 ENCOUNTER — Other Ambulatory Visit: Payer: Self-pay

## 2019-09-28 ENCOUNTER — Telehealth: Payer: Self-pay

## 2019-09-28 VITALS — BP 140/66 | HR 81 | Temp 97.8°F | Ht 61.5 in | Wt 156.1 lb

## 2019-09-28 DIAGNOSIS — I10 Essential (primary) hypertension: Secondary | ICD-10-CM | POA: Diagnosis not present

## 2019-09-28 DIAGNOSIS — R600 Localized edema: Secondary | ICD-10-CM

## 2019-09-28 DIAGNOSIS — N1832 Chronic kidney disease, stage 3b: Secondary | ICD-10-CM | POA: Diagnosis not present

## 2019-09-28 LAB — RENAL FUNCTION PANEL
Albumin: 4.7 g/dL (ref 3.5–5.2)
BUN: 19 mg/dL (ref 6–23)
CO2: 26 mEq/L (ref 19–32)
Calcium: 9.9 mg/dL (ref 8.4–10.5)
Chloride: 103 mEq/L (ref 96–112)
Creatinine, Ser: 1.5 mg/dL — ABNORMAL HIGH (ref 0.40–1.20)
GFR: 33.48 mL/min — ABNORMAL LOW (ref >60.00)
Glucose, Bld: 97 mg/dL (ref 70–99)
Phosphorus: 3.6 mg/dL (ref 2.3–4.6)
Potassium: 4.3 mEq/L (ref 3.5–5.1)
Sodium: 138 mEq/L (ref 135–145)

## 2019-09-28 LAB — MICROALBUMIN / CREATININE URINE RATIO
Creatinine,U: 41.2 mg/dL
Microalb Creat Ratio: 1.7 mg/g (ref 0.0–30.0)
Microalb, Ur: <0.7 mg/dL (ref 0.0–1.9)

## 2019-09-28 MED ORDER — VALSARTAN 320 MG PO TABS: 320.0000 mg | ORAL_TABLET | Freq: Every day | ORAL | 2 refills | Status: DC

## 2019-09-28 MED ORDER — FUROSEMIDE 20 MG PO TABS: 20.0000 mg | ORAL_TABLET | Freq: Every day | ORAL | 2 refills | Status: DC

## 2019-09-28 NOTE — Telephone Encounter (Signed)
Pt called back to let us know that she spoke to her insurance and all other alternatives are Tier # and because she has not met her deductible,that would be the cost.... She was advised to try [facility] and it will be $53 for 90 day supply and she will be able to afford that, so no need to change medication

## 2019-09-28 NOTE — Patient Instructions (Addendum)
Labs today Change hyzaar (losartan/hctz) to valsartan (diovan) 320mg  daily + furosemide (lasix) 20mg  daily. Start lasix at 1/2 tablet per day to ensure tolerated ok. Schedule lab visit in 1 week to recheck kidney function/potassium on new regimen.  Pending lab results we may refer you to kidney doctor.  Continue leg elevation, good water intake.

## 2019-09-28 NOTE — Assessment & Plan Note (Signed)
Chronic, stable. Forgot hyzaar this morning. See below re: antihypertensive changes - will stop hyzaar 100/25, start valsartan 320mg  + lasix 10-20mg  daily. RTC 1 wk rpt BMP.

## 2019-09-28 NOTE — Assessment & Plan Note (Addendum)
Ongoing dependent edema R>L, more noticeable over the past 3 weeks, causing leg tenderness/soreness. Not consistent with DVT or arterial insufficiency. ?related to CKD. Update labs today. Change meds as per above. She already limits sodium and drinks plenty of water daily.

## 2019-09-28 NOTE — Assessment & Plan Note (Addendum)
CKD present since at least 2015, overall stable stage 3. ?progressive over the past year with latest GFR 35. Check renal panel and microalbumin. Change antihypertensives as above. Anticipate HCTZ less effective given decreased kidney function. Update with effect. Discussed possible nephrology evaluation for progressive kidney disease.

## 2019-09-28 NOTE — Telephone Encounter (Signed)
Pt called the office to let you know that she went to pick up the Rx for Valsartan and the cost would be $140.Marland KitchenMarland KitchenMarland Kitchen pt is requesting alternative medication to send in to replace... I did advise pt, in the meantime to call her insurance to request what they will cover as an alternative

## 2019-09-28 NOTE — Telephone Encounter (Signed)
Noted  

## 2019-09-28 NOTE — Progress Notes (Signed)
This visit was conducted in person.  BP 140/66 (BP Location: Left Arm, Patient Position: Sitting, Cuff Size: Normal)   Pulse 81   Temp 97.8 F (36.6 C) (Temporal)   Ht 5' 1.5" (1.562 m)   Wt 156 lb 1 oz (70.8 kg)   SpO2 95%   BMI 29.01 kg/m    CC: bilateral foot swelling Subjective:    Patient ID: Carrie Lucas, female    DOB: [birth year], 79 y.o.   MRN: [id number]  HPI: Carrie Lucas is a 79 y.o. female presenting on 09/28/2019 for Foot Swelling (C/o bilateral feet swelling in afternoons, worse in right.  Started about 3 wks ago. )   3 wk h/o R>L foot swelling, worse as day progresses. This is painful - can be noticeable at night time. Some days better than others. She already avoids salt in diet. Good water intake daily. No fever, redness, warmth. Denies trauma/injury. Denies chest pain, tightness, dyspnea or dizziness. Stays fatigued. No UE swelling.   She is on amlodipine 5mg  daily and losartan hctz 100/25mg  daily. Avoids NSAIDs due to known CKD, latest GFR 35 (progressive worsening over the years).   Known dizziness attributed to BPV. Also has some intermittent orthostasis. Forgot to take Hyzaar this morning.      Relevant past medical, surgical, family and social history reviewed and updated as indicated. Interim medical history since our last visit reviewed. Allergies and medications reviewed and updated. Outpatient Medications Prior to Visit  Medication Sig Dispense Refill  . acetaminophen (TYLENOL) 650 MG CR tablet Take 650 mg by mouth 2 (two) times daily.    Marland Kitchen amLODipine (NORVASC) 5 MG tablet Take 1 tablet (5 mg total) by mouth daily. 90 tablet 3  . aspirin EC 81 MG tablet Take 1 tablet (81 mg total) by mouth every Monday, Wednesday, and Friday.    Marland Kitchen atorvastatin (LIPITOR) 20 MG tablet Take 1 tablet (20 mg total) by mouth daily. 90 tablet 3  . calcium carbonate (OS-CAL) 600 MG TABS tablet Take 1 tablet (600 mg total) by mouth daily with breakfast.    . Cholecalciferol  (VITAMIN D) 2000 units CAPS Take 1 capsule by mouth daily.    . Multiple Vitamin (MULTIVITAMIN) tablet Take 1 tablet by mouth daily.    Marland Kitchen losartan-hydrochlorothiazide (HYZAAR) 100-25 MG tablet Take 1 tablet by mouth daily. 90 tablet 3   No facility-administered medications prior to visit.     Per HPI unless specifically indicated in ROS section below Review of Systems Objective:  BP 140/66 (BP Location: Left Arm, Patient Position: Sitting, Cuff Size: Normal)   Pulse 81   Temp 97.8 F (36.6 C) (Temporal)   Ht 5' 1.5" (1.562 m)   Wt 156 lb 1 oz (70.8 kg)   SpO2 95%   BMI 29.01 kg/m   Wt Readings from Last 3 Encounters:  09/28/19 156 lb 1 oz (70.8 kg)  07/24/19 157 lb 7 oz (71.4 kg)  07/23/19 158 lb 9 oz (71.9 kg)      Physical Exam Vitals and nursing note reviewed.  Constitutional:      Appearance: Normal appearance. She is not ill-appearing.  Cardiovascular:     Rate and Rhythm: Normal rate and regular rhythm.     Pulses: Normal pulses.     Heart sounds: Normal heart sounds. No murmur.  Pulmonary:     Effort: Pulmonary effort is normal. No respiratory distress.     Breath sounds: No wheezing, rhonchi or rales.  Musculoskeletal:  Right lower leg: No edema.     Left lower leg: No edema.     Comments:  No pedal edema noted today - however marked edema of R foot based on recent MyChart picture (see below) 2+ DP bilaterally  Neurological:     Mental Status: She is alert.  Psychiatric:        Mood and Affect: Mood normal.        Behavior: Behavior normal.        Picture from MyChart message 09/17/2019  Results for orders placed or performed in visit on 07/05/19  Lipid panel  Result Value Ref Range   Cholesterol 128 0 - 200 mg/dL   Triglycerides 204.0 (H) 0.0 - 149.0 mg/dL   HDL 38.40 (L) >39.00 mg/dL   VLDL 40.8 (H) 0.0 - 40.0 mg/dL   Total CHOL/HDL Ratio 3    NonHDL 89.74   Renal function panel  Result Value Ref Range   Sodium 141 135 - 145 mEq/L    Potassium 3.8 3.5 - 5.1 mEq/L   Chloride 103 96 - 112 mEq/L   CO2 28 19 - 32 mEq/L   Albumin 4.3 3.5 - 5.2 g/dL   BUN 28 (H) 6 - 23 mg/dL   Creatinine, Ser 1.43 (H) 0.40 - 1.20 mg/dL   Glucose, Bld 100 (H) 70 - 99 mg/dL   Phosphorus 3.2 2.3 - 4.6 mg/dL   GFR 35.40 (L) >60.00 mL/min   Calcium 9.9 8.4 - 10.5 mg/dL  Parathyroid hormone, intact (no Ca)  Result Value Ref Range   PTH 25 14 - 64 pg/mL  LDL cholesterol, direct  Result Value Ref Range   Direct LDL 62.0 mg/dL  POCT Urinalysis Dipstick (Automated)  Result Value Ref Range   Color, UA yellow    Clarity, UA clear    Glucose, UA Negative Negative   Bilirubin, UA negative    Ketones, UA negative    Spec Grav, UA 1.020 1.010 - 1.025   Blood, UA negative    pH, UA 6.5 5.0 - 8.0   Protein, UA Negative Negative   Urobilinogen, UA 0.2 0.2 or 1.0 E.U./dL   Nitrite, UA negative    Leukocytes, UA Negative Negative   Assessment & Plan:  This visit occurred during the SARS-CoV-2 public health emergency.  Safety protocols were in place, including screening questions prior to the visit, additional usage of staff PPE, and extensive cleaning of exam room while observing appropriate contact time as indicated for disinfecting solutions.   Problem List Items Addressed This Visit    Pedal edema - Primary    Ongoing dependent edema R>L, more noticeable over the past 3 weeks, causing leg tenderness/soreness. Not consistent with DVT or arterial insufficiency. ?related to CKD. Update labs today. Change meds as per above. She already limits sodium and drinks plenty of water daily.       HTN (hypertension)    Chronic, stable. Forgot hyzaar this morning. See below re: antihypertensive changes - will stop hyzaar 100/25, start valsartan 320mg  + lasix 10-20mg  daily. RTC 1 wk rpt BMP.       Relevant Medications   valsartan (DIOVAN) 320 MG tablet   furosemide (LASIX) 20 MG tablet   CKD (chronic kidney disease) stage 3, GFR 30-59 ml/min (HCC)    CKD  present since at least 2015, overall stable stage 3. ?progressive over the past year with latest GFR 35. Check renal panel and microalbumin. Change antihypertensives as above. Anticipate HCTZ less effective given decreased kidney  function. Update with effect. Discussed possible nephrology evaluation for progressive kidney disease.       Relevant Orders   Renal function panel   Microalbumin / creatinine urine ratio   Basic metabolic panel       Meds ordered this encounter  Medications  . valsartan (DIOVAN) 320 MG tablet    Sig: Take 1 tablet (320 mg total) by mouth daily.    Dispense:  90 tablet    Refill:  2  . furosemide (LASIX) 20 MG tablet    Sig: Take 1 tablet (20 mg total) by mouth daily.    Dispense:  90 tablet    Refill:  2   Orders Placed This Encounter  Procedures  . Renal function panel  . Microalbumin / creatinine urine ratio  . Basic metabolic panel    Standing Status:   Future    Standing Expiration Date:   09/27/2020    Patient Instructions  Labs today Change hyzaar (losartan/hctz) to valsartan (diovan) 320mg  daily + furosemide (lasix) 20mg  daily. Start lasix at 1/2 tablet per day to ensure tolerated ok. Schedule lab visit in 1 week to recheck kidney function/potassium on new regimen.  Pending lab results we may refer you to kidney doctor.  Continue leg elevation, good water intake.    Follow up plan: No follow-ups on file.  Ria Bush, MD

## 2019-10-01 ENCOUNTER — Other Ambulatory Visit: Payer: Self-pay | Admitting: Family Medicine

## 2019-10-01 DIAGNOSIS — N1832 Chronic kidney disease, stage 3b: Secondary | ICD-10-CM

## 2019-10-04 ENCOUNTER — Other Ambulatory Visit: Payer: Self-pay

## 2019-10-05 ENCOUNTER — Other Ambulatory Visit (INDEPENDENT_AMBULATORY_CARE_PROVIDER_SITE_OTHER): Payer: Medicare Other

## 2019-10-05 DIAGNOSIS — N1832 Chronic kidney disease, stage 3b: Secondary | ICD-10-CM

## 2019-10-05 LAB — BASIC METABOLIC PANEL
BUN: 24 mg/dL — ABNORMAL HIGH (ref 6–23)
CO2: 27 mEq/L (ref 19–32)
Calcium: 9.7 mg/dL (ref 8.4–10.5)
Chloride: 104 mEq/L (ref 96–112)
Creatinine, Ser: 1.49 mg/dL — ABNORMAL HIGH (ref 0.40–1.20)
GFR: 33.74 mL/min — ABNORMAL LOW (ref >60.00)
Glucose, Bld: 107 mg/dL — ABNORMAL HIGH (ref 70–99)
Potassium: 4.5 mEq/L (ref 3.5–5.1)
Sodium: 138 mEq/L (ref 135–145)

## 2019-10-22 ENCOUNTER — Encounter: Payer: Self-pay | Admitting: Family Medicine

## 2019-11-14 ENCOUNTER — Encounter: Payer: Self-pay | Admitting: Family Medicine

## 2019-12-07 DIAGNOSIS — H8110 Benign paroxysmal vertigo, unspecified ear: Secondary | ICD-10-CM | POA: Diagnosis not present

## 2019-12-07 DIAGNOSIS — K589 Irritable bowel syndrome without diarrhea: Secondary | ICD-10-CM | POA: Diagnosis not present

## 2019-12-07 DIAGNOSIS — N1832 Chronic kidney disease, stage 3b: Secondary | ICD-10-CM | POA: Diagnosis not present

## 2019-12-07 DIAGNOSIS — N281 Cyst of kidney, acquired: Secondary | ICD-10-CM | POA: Diagnosis not present

## 2019-12-07 DIAGNOSIS — I129 Hypertensive chronic kidney disease with stage 1 through stage 4 chronic kidney disease, or unspecified chronic kidney disease: Secondary | ICD-10-CM | POA: Diagnosis not present

## 2020-01-03 ENCOUNTER — Ambulatory Visit: Payer: Medicare Other

## 2020-01-03 ENCOUNTER — Other Ambulatory Visit: Payer: Self-pay

## 2020-01-03 ENCOUNTER — Other Ambulatory Visit (INDEPENDENT_AMBULATORY_CARE_PROVIDER_SITE_OTHER): Payer: Medicare Other

## 2020-01-03 ENCOUNTER — Other Ambulatory Visit: Payer: Self-pay | Admitting: Family Medicine

## 2020-01-03 DIAGNOSIS — E782 Mixed hyperlipidemia: Secondary | ICD-10-CM

## 2020-01-03 DIAGNOSIS — N1832 Chronic kidney disease, stage 3b: Secondary | ICD-10-CM

## 2020-01-03 LAB — CBC WITH DIFFERENTIAL/PLATELET
Basophils Absolute: 0.1 10*3/uL (ref 0.0–0.1)
Basophils Relative: 0.8 % (ref 0.0–3.0)
Eosinophils Absolute: 0.2 10*3/uL (ref 0.0–0.7)
Eosinophils Relative: 3.2 % (ref 0.0–5.0)
HCT: 36.5 % (ref 36.0–46.0)
Hemoglobin: 12.2 g/dL (ref 12.0–15.0)
Lymphocytes Relative: 53.2 % — ABNORMAL HIGH (ref 12.0–46.0)
Lymphs Abs: 3.6 10*3/uL (ref 0.7–4.0)
MCHC: 33.5 g/dL (ref 30.0–36.0)
MCV: 94.9 fl (ref 78.0–100.0)
Monocytes Absolute: 0.5 10*3/uL (ref 0.1–1.0)
Monocytes Relative: 7.4 % (ref 3.0–12.0)
Neutro Abs: 2.4 10*3/uL (ref 1.4–7.7)
Neutrophils Relative %: 35.4 % — ABNORMAL LOW (ref 43.0–77.0)
Platelets: 306 10*3/uL (ref 150.0–400.0)
RBC: 3.84 Mil/uL — ABNORMAL LOW (ref 3.87–5.11)
RDW: 12.6 % (ref 11.5–15.5)
WBC: 6.8 10*3/uL (ref 4.0–10.5)

## 2020-01-03 LAB — COMPREHENSIVE METABOLIC PANEL
ALT: 14 U/L (ref 0–35)
AST: 17 U/L (ref 0–37)
Albumin: 4.3 g/dL (ref 3.5–5.2)
Alkaline Phosphatase: 77 U/L (ref 39–117)
BUN: 19 mg/dL (ref 6–23)
CO2: 27 mEq/L (ref 19–32)
Calcium: 9.7 mg/dL (ref 8.4–10.5)
Chloride: 106 mEq/L (ref 96–112)
Creatinine, Ser: 1.39 mg/dL — ABNORMAL HIGH (ref 0.40–1.20)
GFR: 36.53 mL/min — ABNORMAL LOW (ref >60.00)
Glucose, Bld: 98 mg/dL (ref 70–99)
Potassium: 4.4 mEq/L (ref 3.5–5.1)
Sodium: 140 mEq/L (ref 135–145)
Total Bilirubin: 0.4 mg/dL (ref 0.2–1.2)
Total Protein: 6.9 g/dL (ref 6.0–8.3)

## 2020-01-03 LAB — VITAMIN D 25 HYDROXY (VIT D DEFICIENCY, FRACTURES): VITD: 68.05 ng/mL (ref 30.00–100.00)

## 2020-01-03 LAB — LIPID PANEL
Cholesterol: 115 mg/dL (ref 0–200)
HDL: 40.6 mg/dL (ref >39.00)
LDL Cholesterol: 42 mg/dL (ref 0–99)
NonHDL: 74.33
Total CHOL/HDL Ratio: 3
Triglycerides: 160 mg/dL — ABNORMAL HIGH (ref 0.0–149.0)
VLDL: 32 mg/dL (ref 0.0–40.0)

## 2020-01-04 ENCOUNTER — Ambulatory Visit: Payer: Medicare Other

## 2020-01-07 ENCOUNTER — Encounter: Payer: Self-pay | Admitting: Family Medicine

## 2020-01-07 ENCOUNTER — Other Ambulatory Visit: Payer: Self-pay

## 2020-01-07 ENCOUNTER — Ambulatory Visit (INDEPENDENT_AMBULATORY_CARE_PROVIDER_SITE_OTHER): Payer: Medicare Other | Admitting: Family Medicine

## 2020-01-07 VITALS — BP 136/58 | HR 87 | Temp 97.9°F | Ht 61.25 in | Wt 146.5 lb

## 2020-01-07 DIAGNOSIS — D72820 Lymphocytosis (symptomatic): Secondary | ICD-10-CM | POA: Insufficient documentation

## 2020-01-07 DIAGNOSIS — M8949 Other hypertrophic osteoarthropathy, multiple sites: Secondary | ICD-10-CM | POA: Diagnosis not present

## 2020-01-07 DIAGNOSIS — M8580 Other specified disorders of bone density and structure, unspecified site: Secondary | ICD-10-CM

## 2020-01-07 DIAGNOSIS — E782 Mixed hyperlipidemia: Secondary | ICD-10-CM | POA: Diagnosis not present

## 2020-01-07 DIAGNOSIS — K581 Irritable bowel syndrome with constipation: Secondary | ICD-10-CM

## 2020-01-07 DIAGNOSIS — K219 Gastro-esophageal reflux disease without esophagitis: Secondary | ICD-10-CM | POA: Diagnosis not present

## 2020-01-07 DIAGNOSIS — Z0000 Encounter for general adult medical examination without abnormal findings: Secondary | ICD-10-CM

## 2020-01-07 DIAGNOSIS — I10 Essential (primary) hypertension: Secondary | ICD-10-CM

## 2020-01-07 DIAGNOSIS — Z7189 Other specified counseling: Secondary | ICD-10-CM

## 2020-01-07 DIAGNOSIS — H9193 Unspecified hearing loss, bilateral: Secondary | ICD-10-CM | POA: Diagnosis not present

## 2020-01-07 DIAGNOSIS — N281 Cyst of kidney, acquired: Secondary | ICD-10-CM

## 2020-01-07 DIAGNOSIS — N1832 Chronic kidney disease, stage 3b: Secondary | ICD-10-CM

## 2020-01-07 DIAGNOSIS — G4700 Insomnia, unspecified: Secondary | ICD-10-CM

## 2020-01-07 DIAGNOSIS — I6523 Occlusion and stenosis of bilateral carotid arteries: Secondary | ICD-10-CM

## 2020-01-07 DIAGNOSIS — M159 Polyosteoarthritis, unspecified: Secondary | ICD-10-CM

## 2020-01-07 MED ORDER — ACETAMINOPHEN ER 650 MG PO TBCR: 650.0000 mg | EXTENDED_RELEASE_TABLET | Freq: Three times a day (TID) | ORAL | Status: AC | PRN

## 2020-01-07 NOTE — Assessment & Plan Note (Addendum)
New lymphocytosis (53%). Pt denies LAD, night sweats fevers or fatigue. Recheck in 1-2 months.

## 2020-01-07 NOTE — Assessment & Plan Note (Signed)
Chronic, stable on lipitor - continue. The ASCVD Risk score Carrie Bussing DC Jr., et al., 2013) failed to calculate for the following reasons:   The valid total cholesterol range is 130 to 320 mg/dL

## 2020-01-07 NOTE — Assessment & Plan Note (Signed)
Stable on repeat imaging.

## 2020-01-07 NOTE — Assessment & Plan Note (Signed)
Doing well on scheduled tylenol, reviewed dosing. Avoids NSAIDs in h/o CKD.

## 2020-01-07 NOTE — Patient Instructions (Addendum)
Increase water intake.  Continue current medicines.  We will refer you for audiology evaluation.  Check up front about flu clinic.  If interested, check with pharmacy about new 2 shot shingles series (shingrix).  Bring [place] copy of your health care power of attorney.  We will order updated carotid ultrasound to follow mild plaque in neck arteries.  Return in 6 months for follow up visit  Return in 1-2 months for labs only to recheck blood counts (specifically lymphocytes).   Health Maintenance After Age 61 After age 28, you are at a higher risk for certain long-term diseases and infections as well as injuries from falls. Falls are a major cause of broken bones and head injuries in people who are older than age 17. Getting regular preventive care can help to keep you healthy and well. Preventive care includes getting regular testing and making lifestyle changes as recommended by your health care provider. Talk with your health care provider about:  Which screenings and tests you should have. A screening is a test that checks for a disease when you have no symptoms.  A diet and exercise plan that is right for you. What should I know about screenings and tests to prevent falls? Screening and testing are the best ways to find a health problem early. Early diagnosis and treatment give you the best chance of managing medical conditions that are common after age 9. Certain conditions and lifestyle choices may make you more likely to have a fall. Your health care provider may recommend:  Regular vision checks. Poor vision and conditions such as cataracts can make you more likely to have a fall. If you wear glasses, make sure to get your prescription updated if your vision changes.  Medicine review. Work with your health care provider to regularly review all of the medicines you are taking, including over-the-counter medicines. Ask your health care provider about any side effects that may make you more  likely to have a fall. Tell your health care provider if any medicines that you take make you feel dizzy or sleepy.  Osteoporosis screening. Osteoporosis is a condition that causes the bones to get weaker. This can make the bones weak and cause them to break more easily.  Blood pressure screening. Blood pressure changes and medicines to control blood pressure can make you feel dizzy.  Strength and balance checks. Your health care provider may recommend certain tests to check your strength and balance while standing, walking, or changing positions.  Foot health exam. Foot pain and numbness, as well as not wearing proper footwear, can make you more likely to have a fall.  Depression screening. You may be more likely to have a fall if you have a fear of falling, feel emotionally low, or feel unable to do activities that you used to do.  Alcohol use screening. Using too much alcohol can affect your balance and may make you more likely to have a fall. What actions can I take to lower my risk of falls? General instructions  Talk with your health care provider about your risks for falling. Tell your health care provider if: ? You fall. Be sure to tell your health care provider about all falls, even ones that seem minor. ? You feel dizzy, sleepy, or off-balance.  Take over-the-counter and prescription medicines only as told by your health care provider. These include any supplements.  Eat a healthy diet and maintain a healthy weight. A healthy diet includes low-fat dairy products, low-fat (lean)  meats, and fiber from whole grains, beans, and lots of fruits and vegetables. Home safety  Remove any tripping hazards, such as rugs, cords, and clutter.  Install safety equipment such as grab bars in bathrooms and safety rails on stairs.  Keep rooms and walkways well-lit. Activity   Follow a regular exercise program to stay fit. This will help you maintain your balance. Ask your health care provider  what types of exercise are appropriate for you.  If you need a cane or walker, use it as recommended by your health care provider.  Wear supportive shoes that have nonskid soles. Lifestyle  Do not drink alcohol if your health care provider tells you not to drink.  If you drink alcohol, limit how much you have: ? 0-1 drink a day for women. ? 0-2 drinks a day for men.  Be aware of how much alcohol is in your drink. In the [place] one drink equals one typical bottle of beer (12 oz), one-half glass of wine (5 oz), or one shot of hard liquor (1 oz).  Do not use any products that contain nicotine or tobacco, such as cigarettes and e-cigarettes. If you need help quitting, ask your health care provider. Summary  Having a healthy lifestyle and getting preventive care can help to protect your health and wellness after age 74.  Screening and testing are the best way to find a health problem early and help you avoid having a fall. Early diagnosis and treatment give you the best chance for managing medical conditions that are more common for people who are older than age 55.  Falls are a major cause of broken bones and head injuries in people who are older than age 76. Take precautions to prevent a fall at home.  Work with your health care provider to learn what changes you can make to improve your health and wellness and to prevent falls. This information is not intended to replace advice given to you by your health care provider. Make sure you discuss any questions you have with your health care provider. Document Revised: 08/03/2018 Document Reviewed: 02/23/2017 Elsevier Patient Education  2020 [employer].

## 2020-01-07 NOTE — Assessment & Plan Note (Addendum)
Advanced directives: Would want Fritz Pickerel or daughter to be POA- but form not filled out. She will bring updated HCPOA form. Living will scanned 11/2016

## 2020-01-07 NOTE — Assessment & Plan Note (Signed)
Reviewed. Appreciate renal care - has established with Dr Royce Macadamia - thought hypertensive nephropathy. Now off diuretic, just continues ARB with good effect. Aware to stay hydrated and avoid sodium in diet.

## 2020-01-07 NOTE — Progress Notes (Signed)
This visit was conducted in person.  BP (!) 136/58 (BP Location: Right Arm, Patient Position: Sitting, Cuff Size: Normal)   Pulse 87   Temp 97.9 F (36.6 C) (Temporal)   Ht 5' 1.25" (1.556 m)   Wt 146 lb 8 oz (66.5 kg)   SpO2 95%   BMI 27.46 kg/m   BP Readings from Last 3 Encounters:  01/07/20 (!) 136/58  09/28/19 140/66  07/24/19 (!) 132/54  Home BP running 110s/50s   CC: AMW Subjective:    Patient ID: Carrie Lucas, female    DOB: [birth year], 79 y.o.   MRN: [id number]  HPI: Carrie Lucas is a 79 y.o. female presenting on 01/07/2020 for Medicare Wellness (Wants to discuss Tylenol schedule.)   Did not see health advisor.   Hearing Screening   125Hz  250Hz  500Hz  1000Hz  2000Hz  3000Hz  4000Hz  6000Hz  8000Hz   Right ear:   25 40 40  0    Left ear:   25 40 0  0    Vision Screening Comments: Last eye exam, 02/2019.    Office Visit from 01/07/2020 in [place] at [facility] Total Score 0    Discussed hearing loss - declines audiology eval. Notes trouble hearing sermon at church. Agrees to formal audiology eval.  Fall Risk  01/07/2020 01/02/2019 12/27/2017 12/20/2016 12/18/2015  Falls in the past year? 0 0 No No Yes  Comment - - - - hx of accidental fall with injury (sprained wrist)  Number falls in past yr: - - - - 1  Injury with Fall? - - - - Yes  Risk for fall due to : - Medication side effect - - History of fall(s);Impaired balance/gait  Risk for fall due to: Comment - - - - pt reports at times her knee "buckles" without warning  Follow up - Falls evaluation completed;Falls prevention discussed - - Falls evaluation completed    Established with [facility] nephrology Dr Royce Macadamia - thought hypertensive nephropathy, lasix stopped to limit volume depletion. Has changed her diet - significantly limiting sodium content. 10 lb weight loss in the past 6 months! She has significantly changed diet.   Taking tylenol 650mg  BID with tylenol PM at night time. Asks about tylenol PM.  Recommended against anticholinergic. Tried melatonin without benefit. Will return to plain tylenol  Preventative: COLONOSCOPY Date:8/2012TAx1, diverticulosis, rpt 5 yrs (Outlaw). COLONOSCOPY 03/2016-WNL (Outlaw).  Well woman with GYN Dr. Orpah Greek, last saw 2017. S/p hysterectomy and oophorectomy. Discussed monitoring for skin changes (vaginal/vulvar cancer).  Mammogram 04/2019 Birads1. +fmhx. DEXA -9/2017DEXA T -1.6 hip, -1.3 forearm-mild osteopenia  Flu yearly  Pneumovax 08/2005. prevnar 11/2013  Tdap 04/2013  COVID vaccine - [place] 04/2019, 06/2019 zostavax - 08/2013 shingrix - discussed Advanced directives: Would want Fritz Pickerel or daughter to be POA- but form not filled out. Living will scanned 11/2016 Seat belt use discussed.  Sunscreen use discussed. No changing moles on skin. H/o melanoma, sees dermDr Whitworthyearly.  Never smoker. No alcohol. 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 Interim medical history since our last visit reviewed. Allergies and medications reviewed and updated. Outpatient Medications Prior to Visit  Medication Sig  Dispense Refill  . amLODipine (NORVASC) 5 MG tablet Take 1 tablet (5 mg total) by mouth daily. 90 tablet 3  . aspirin EC 81 MG tablet Take 1 tablet (81 mg total) by mouth every Monday, Wednesday, and Friday.    Marland Kitchen atorvastatin (LIPITOR) 20 MG tablet Take 1 tablet (20 mg total) by mouth daily. 90 tablet 3  . calcium carbonate (OS-CAL) 600 MG TABS tablet Take 1 tablet (600 mg total) by mouth daily with breakfast.    . Cholecalciferol (VITAMIN D) 2000 units  CAPS Take 1 capsule by mouth daily.    . Multiple Vitamin (MULTIVITAMIN) tablet Take 1 tablet by mouth daily.    . valsartan (DIOVAN) 320 MG tablet Take 1 tablet (320 mg total) by mouth daily. 90 tablet 2  . acetaminophen (TYLENOL) 650 MG CR tablet Take 650 mg by mouth 2 (two) times daily. Only takes in morning    . diphenhydramine-acetaminophen (TYLENOL PM) 25-500 MG TABS tablet Take 1 tablet by mouth at bedtime as needed.    . furosemide (LASIX) 20 MG tablet Take 1 tablet (20 mg total) by mouth daily. (Patient not taking: Reported on 01/07/2020) 90 tablet 2   No facility-administered medications prior to visit.     Per HPI unless specifically indicated in ROS section below Review of Systems Objective:  BP (!) 136/58 (BP Location: Right Arm, Patient Position: Sitting, Cuff Size: Normal)   Pulse 87   Temp 97.9 F (36.6 C) (Temporal)   Ht 5' 1.25" (1.556 m)   Wt 146 lb 8 oz (66.5 kg)   SpO2 95%   BMI 27.46 kg/m   Wt Readings from Last 3 Encounters:  01/07/20 146 lb 8 oz (66.5 kg)  09/28/19 156 lb 1 oz (70.8 kg)  07/24/19 157 lb 7 oz (71.4 kg)      Physical Exam Vitals and nursing note reviewed.  Constitutional:      General: She is not in acute distress.    Appearance: Normal appearance. She is well-developed. She is not ill-appearing.  HENT:     Head: Normocephalic and atraumatic.     Right Ear: Hearing, tympanic membrane, ear canal and external ear normal.     Left Ear: Hearing, tympanic membrane, ear canal and external ear normal.     Nose: Nose normal.     Mouth/Throat:     Pharynx: Uvula midline. No oropharyngeal exudate or posterior oropharyngeal erythema.  Eyes:     General: No scleral icterus.    Conjunctiva/sclera: Conjunctivae normal.     Pupils: Pupils are equal, round, and reactive to light.  Neck:     Vascular: Carotid bruit (L sided) present.  Cardiovascular:     Rate and Rhythm: Normal rate and regular rhythm.     Pulses:          Radial pulses are 2+ on  the right side and 2+ on the left side.     Heart sounds: Normal heart sounds. No murmur heard.   Pulmonary:     Effort: Pulmonary effort is normal. No respiratory distress.     Breath sounds: Normal breath sounds. No wheezing or rales.  Abdominal:     General: Bowel sounds are normal. There is no distension.     Palpations: Abdomen is soft. There is no mass.     Tenderness: There is no abdominal tenderness. There is no guarding or rebound.  Musculoskeletal:        General: Normal range of motion.  Cervical back: Normal range of motion and neck supple.  Lymphadenopathy:     Cervical: No cervical adenopathy.  Skin:    General: Skin is warm and dry.     Findings: No rash.  Neurological:     General: No focal deficit present.     Mental Status: She is alert and oriented to person, place, and time.     Comments:  CN grossly intact, station and gait intact Recall 3/3 Calculation 5/5 DLROW  Psychiatric:        Mood and Affect: Mood normal.        Behavior: Behavior normal.        Thought Content: Thought content normal.        Judgment: Judgment normal.       Results for orders placed or performed in visit on 01/03/20  VITAMIN D 25 Hydroxy (Vit-D Deficiency, Fractures)  Result Value Ref Range   VITD 68.05 30.00 - 100.00 ng/mL  CBC with Differential/Platelet  Result Value Ref Range   WBC 6.8 4.0 - 10.5 K/uL   RBC 3.84 (L) 3.87 - 5.11 Mil/uL   Hemoglobin 12.2 12.0 - 15.0 g/dL   HCT 36.5 36 - 46 %   MCV 94.9 78.0 - 100.0 fl   MCHC 33.5 30.0 - 36.0 g/dL   RDW 12.6 11.5 - 15.5 %   Platelets 306.0 150 - 400 K/uL   Neutrophils Relative % 35.4 (L) 43 - 77 %   Lymphocytes Relative 53.2 (H) 12 - 46 %   Monocytes Relative 7.4 3 - 12 %   Eosinophils Relative 3.2 0 - 5 %   Basophils Relative 0.8 0 - 3 %   Neutro Abs 2.4 1.4 - 7.7 K/uL   Lymphs Abs 3.6 0.7 - 4.0 K/uL   Monocytes Absolute 0.5 0 - 1 K/uL   Eosinophils Absolute 0.2 0 - 0 K/uL   Basophils Absolute 0.1 0 - 0 K/uL    Comprehensive metabolic panel  Result Value Ref Range   Sodium 140 135 - 145 mEq/L   Potassium 4.4 3.5 - 5.1 mEq/L   Chloride 106 96 - 112 mEq/L   CO2 27 19 - 32 mEq/L   Glucose, Bld 98 70 - 99 mg/dL   BUN 19 6 - 23 mg/dL   Creatinine, Ser 1.39 (H) 0.40 - 1.20 mg/dL   Total Bilirubin 0.4 0.2 - 1.2 mg/dL   Alkaline Phosphatase 77 39 - 117 U/L   AST 17 0 - 37 U/L   ALT 14 0 - 35 U/L   Total Protein 6.9 6.0 - 8.3 g/dL   Albumin 4.3 3.5 - 5.2 g/dL   GFR 36.53 (L) >60.00 mL/min   Calcium 9.7 8.4 - 10.5 mg/dL  Lipid panel  Result Value Ref Range   Cholesterol 115 0 - 200 mg/dL   Triglycerides 160.0 (H) 0 - 149 mg/dL   HDL 40.60 >39.00 mg/dL   VLDL 32.0 0.0 - 40.0 mg/dL   LDL Cholesterol 42 0 - 99 mg/dL   Total CHOL/HDL Ratio 3    NonHDL 74.33    Assessment & Plan:  This visit occurred during the SARS-CoV-2 public health emergency.  Safety protocols were in place, including screening questions prior to the visit, additional usage of staff PPE, and extensive cleaning of exam room while observing appropriate contact time as indicated for disinfecting solutions.   Problem List Items Addressed This Visit    Renal cyst, acquired, right    Stable on repeat imaging.  Osteopenia    Continue calcium vit D and regular weight bearing exercise. Update DEXA next year.       Osteoarthritis    Doing well on scheduled tylenol, reviewed dosing. Avoids NSAIDs in h/o CKD.       Relevant Medications   acetaminophen (TYLENOL) 650 MG CR tablet   Medicare annual wellness visit, subsequent - Primary    I have personally reviewed the Medicare Annual Wellness questionnaire and have noted 1. The patient's medical and social history 2. Their use of alcohol, tobacco or illicit drugs 3. Their current medications and supplements 4. The patient's functional ability including ADL's, fall risks, home safety risks and hearing or visual impairment. Cognitive function has been assessed and addressed as  indicated.  5. Diet and physical activity 6. Evidence for depression or mood disorders The patients weight, height, BMI have been recorded in the chart. I have made referrals, counseling and provided education to the patient based on review of the above and I have provided the pt with a written personalized care plan for preventive services. Provider list updated.. See scanned questionairre as needed for further documentation. Reviewed preventative protocols and updated unless pt declined.       Lymphocytosis    New lymphocytosis (53%). Pt denies LAD, night sweats fevers or fatigue. Recheck in 1-2 months.       Relevant Orders   CBC with Differential/Platelet   Pathologist smear review   LPRD (laryngopharyngeal reflux disease)    Now off PPI.       Insomnia    Chronic initiation insomnia. Discussed options - she will try to limit anticholinergic use. Melatonin previously ineffective.       IBS (irritable bowel syndrome)   HTN (hypertension)    Chronic, stable. Continue current regimen.       HLD (hyperlipidemia) (Chronic)    Chronic, stable on lipitor - continue. The ASCVD Risk score Mikey Bussing DC Jr., et al., 2013) failed to calculate for the following reasons:   The valid total cholesterol range is 130 to 320 mg/dL       CKD (chronic kidney disease) stage 3, GFR 30-59 ml/min (HCC)    Reviewed. Appreciate renal care - has established with Dr Royce Macadamia - thought hypertensive nephropathy. Now off diuretic, just continues ARB with good effect. Aware to stay hydrated and avoid sodium in diet.       Carotid stenosis, asymptomatic    Harsher sounding L bruit - update carotid US      Relevant Orders   VAS US CAROTID   Bilateral hearing loss    Agrees to audiology referral.       Relevant Orders   Ambulatory referral to Audiology   Advanced care planning/counseling discussion    Advanced directives: Would want Fritz Pickerel or daughter to be POA- but form not filled out. She will bring  updated HCPOA form. Living will scanned 11/2016          Meds ordered this encounter  Medications  . acetaminophen (TYLENOL) 650 MG CR tablet    Sig: Take 1 tablet (650 mg total) by mouth 3 (three) times daily as needed for pain.   Orders Placed This Encounter  Procedures  . CBC with Differential/Platelet    Standing Status:   Future    Standing Expiration Date:   01/06/2021  . Pathologist smear review    Standing Status:   Future    Standing Expiration Date:   01/06/2021  . Ambulatory referral to Audiology  Referral Priority:   Routine    Referral Type:   Audiology Exam    Referral Reason:   Specialty Services Required    Number of Visits Requested:   1    Patient instructions: Increase water intake.  Continue current medicines.  We will refer you for audiology evaluation.  Check up front about flu clinic.  If interested, check with pharmacy about new 2 shot shingles series (shingrix).  Bring [place] copy of your health care power of attorney.  We will order updated carotid ultrasound to follow mild plaque in neck arteries.  Return in 6 months for follow up visit  Return in 1-2 months for labs only to recheck blood counts (specifically lymphocytes).   Follow up plan: Return in about 6 months (around 07/06/2020), or if symptoms worsen or fail to improve, for follow up visit.  Ria Bush, MD

## 2020-01-07 NOTE — Assessment & Plan Note (Signed)
Continue calcium vit D and regular weight bearing exercise. Update DEXA next year.

## 2020-01-07 NOTE — Assessment & Plan Note (Signed)

## 2020-01-07 NOTE — Assessment & Plan Note (Signed)
Harsher sounding L bruit - update carotid US

## 2020-01-07 NOTE — Assessment & Plan Note (Signed)
Chronic initiation insomnia. Discussed options - she will try to limit anticholinergic use. Melatonin previously ineffective.

## 2020-01-07 NOTE — Assessment & Plan Note (Signed)
Agrees to audiology referral.

## 2020-01-07 NOTE — Assessment & Plan Note (Signed)
Now off PPI.

## 2020-01-07 NOTE — Assessment & Plan Note (Signed)
Chronic, stable. Continue current regimen. 

## 2020-01-10 ENCOUNTER — Ambulatory Visit (INDEPENDENT_AMBULATORY_CARE_PROVIDER_SITE_OTHER): Payer: Medicare Other

## 2020-01-10 ENCOUNTER — Other Ambulatory Visit: Payer: Self-pay

## 2020-01-10 DIAGNOSIS — Z23 Encounter for immunization: Secondary | ICD-10-CM | POA: Diagnosis not present

## 2020-01-11 DIAGNOSIS — I872 Venous insufficiency (chronic) (peripheral): Secondary | ICD-10-CM | POA: Diagnosis not present

## 2020-01-11 DIAGNOSIS — I8311 Varicose veins of right lower extremity with inflammation: Secondary | ICD-10-CM | POA: Diagnosis not present

## 2020-01-11 DIAGNOSIS — Z85828 Personal history of other malignant neoplasm of skin: Secondary | ICD-10-CM | POA: Diagnosis not present

## 2020-01-11 DIAGNOSIS — I8312 Varicose veins of left lower extremity with inflammation: Secondary | ICD-10-CM | POA: Diagnosis not present

## 2020-01-21 ENCOUNTER — Ambulatory Visit (HOSPITAL_COMMUNITY)
Admission: RE | Admit: 2020-01-21 | Discharge: 2020-01-21 | Disposition: A | Discharge status: Home / Self Care | Payer: Medicare Other | Source: Ambulatory Visit | Attending: Internal Medicine | Admitting: Internal Medicine

## 2020-01-21 ENCOUNTER — Other Ambulatory Visit: Payer: Self-pay

## 2020-01-21 ENCOUNTER — Other Ambulatory Visit: Payer: Self-pay | Admitting: Family Medicine

## 2020-01-21 DIAGNOSIS — I6523 Occlusion and stenosis of bilateral carotid arteries: Secondary | ICD-10-CM | POA: Insufficient documentation

## 2020-01-25 ENCOUNTER — Telehealth: Payer: Self-pay

## 2020-01-25 NOTE — Telephone Encounter (Signed)
Spoke with pt relaying Dr. G's message. Pt verbalizes understanding.  

## 2020-01-25 NOTE — Telephone Encounter (Signed)
Noted. I like phillips colon health or may try activia or align probiotic.

## 2020-01-25 NOTE — Telephone Encounter (Signed)
Pt said on and off for years pt has had lower abd pressure feeling; pt has seen specialist and had multiple testing with no resolve. Pt said when sitting or laying pt does not have lower abd pressure; when pt stands has mid lower abd pressure with no pain but does feel uncomfortable.  Pt has been taking Gas X and that does help the pressure feeling in lower abd.pt has stg 3 chronic kidney disease and does not want to take any med that Dr Darnell Level does not agree with. Pt wants to know if there is probiotic Dr Darnell Level would recommend.  Pt said she did not mention at med well on 01/07/20 because did not have abd pressure at that time. Pt also said 01/19/20 pt had a lot of urinary frequency with no other symptoms than the mid abd pressure. Pt was not sure if beginning a UTI and pt took AZO which has virtually alleviated the frequency of urine. Pt does not have any urinary symptoms now. Pt has no covid symptoms, and no known exposure to + covid. [place] Pt did go ahead and schedule an in office appt on 01/29/20 at 4 PM with DR G to see if UTI. If pt condition changes or worsens prior to that appt pt will go to [facility].(pt only wants to see Dr Darnell Level; other appts with other providers  offered with sooner appt but pt only wants to see DR G.) pt request cb after DR G reviews this note about a probiotic.

## 2020-01-29 ENCOUNTER — Encounter: Payer: Self-pay | Admitting: Family Medicine

## 2020-01-29 ENCOUNTER — Ambulatory Visit (INDEPENDENT_AMBULATORY_CARE_PROVIDER_SITE_OTHER): Payer: Medicare Other | Admitting: Family Medicine

## 2020-01-29 ENCOUNTER — Other Ambulatory Visit: Payer: Self-pay

## 2020-01-29 VITALS — BP 124/68 | HR 85 | Temp 97.9°F | Ht 61.25 in | Wt 145.0 lb

## 2020-01-29 DIAGNOSIS — R109 Unspecified abdominal pain: Secondary | ICD-10-CM

## 2020-01-29 DIAGNOSIS — N3000 Acute cystitis without hematuria: Secondary | ICD-10-CM | POA: Diagnosis not present

## 2020-01-29 DIAGNOSIS — I6523 Occlusion and stenosis of bilateral carotid arteries: Secondary | ICD-10-CM | POA: Diagnosis not present

## 2020-01-29 LAB — POC URINALSYSI DIPSTICK (AUTOMATED)
Bilirubin, UA: NEGATIVE
Blood, UA: NEGATIVE
Glucose, UA: NEGATIVE
Ketones, UA: NEGATIVE
Nitrite, UA: POSITIVE
Protein, UA: NEGATIVE
Spec Grav, UA: 1.015 (ref 1.010–1.025)
Urobilinogen, UA: 0.2 E.U./dL
pH, UA: 6 (ref 5.0–8.0)

## 2020-01-29 MED ORDER — CEPHALEXIN 500 MG PO CAPS: 500.0000 mg | ORAL_CAPSULE | Freq: Two times a day (BID) | ORAL | 0 refills | Status: DC

## 2020-01-29 NOTE — Assessment & Plan Note (Signed)
Story/exam consistent with acute simple cystitis (last one was 08/2018). Treat with keflex course (normally responds well to this). Update if not improving with treatment. Pt agrees with plan.

## 2020-01-29 NOTE — Patient Instructions (Signed)
Story/urine test consistent with UTI. Treat with 1 wk keflex course. Urine culture sent.  Let us know if not improved with treatment.

## 2020-01-29 NOTE — Progress Notes (Signed)
This visit was conducted in person.  BP 124/68 (BP Location: Left Arm, Patient Position: Sitting, Cuff Size: Normal)   Pulse 85   Temp 97.9 F (36.6 C) (Temporal)   Ht 5' 1.25" (1.556 m)   Wt 145 lb (65.8 kg)   SpO2 97%   BMI 27.17 kg/m    CC: abd pressure, dysuria Subjective:    Patient ID: Carrie Lucas, female    DOB: 24-Apr-1941, 79 y.o.   MRN: [id number]  HPI: Carrie Lucas is a 79 y.o. female presenting on 01/29/2020 for Abdominal Pain (C/o low abd pressure and burning when urinating.  Sxs started about 1.5 wks ago.  Abd pressure is worse when standing.  Tried AZO, helpful.  H/o UTIs. )   1.5 wk h/o lower abd pressure, dysuria, urgency and frequency.  No fevers/chills, nausea, vomiting, flank pain or hematuria.  Azo has helped.  H/o recurrent UTI - last was 08/2018. No recent abx use.  Known CKD.  She started taking [facility] colon [facility] with some benefit.      Relevant past medical, surgical, family and social history reviewed and updated as indicated. Interim medical history since our last visit reviewed. Allergies and medications reviewed and updated. Outpatient Medications Prior to Visit  Medication Sig Dispense Refill  . acetaminophen (TYLENOL) 650 MG CR tablet Take 1 tablet (650 mg total) by mouth 3 (three) times daily as needed for pain.    Marland Kitchen amLODipine (NORVASC) 5 MG tablet Take 1 tablet (5 mg total) by mouth daily. 90 tablet 3  . aspirin EC 81 MG tablet Take 1 tablet (81 mg total) by mouth every Monday, Wednesday, and Friday.    Marland Kitchen atorvastatin (LIPITOR) 20 MG tablet Take 1 tablet by mouth once daily 90 tablet 3  . calcium carbonate (OS-CAL) 600 MG TABS tablet Take 1 tablet (600 mg total) by mouth daily with breakfast.    . Cholecalciferol (VITAMIN D) 2000 units CAPS Take 1 capsule by mouth daily.    . Multiple Vitamin (MULTIVITAMIN) tablet Take 1 tablet by mouth daily.    . valsartan (DIOVAN) 320 MG tablet Take 1 tablet (320 mg total) by mouth daily. 90 tablet 2     No facility-administered medications prior to visit.     Per HPI unless specifically indicated in ROS section below Review of Systems Objective:  BP 124/68 (BP Location: Left Arm, Patient Position: Sitting, Cuff Size: Normal)   Pulse 85   Temp 97.9 F (36.6 C) (Temporal)   Ht 5' 1.25" (1.556 m)   Wt 145 lb (65.8 kg)   SpO2 97%   BMI 27.17 kg/m   Wt Readings from Last 3 Encounters:  01/29/20 145 lb (65.8 kg)  01/07/20 146 lb 8 oz (66.5 kg)  09/28/19 156 lb 1 oz (70.8 kg)      Physical Exam Vitals and nursing note reviewed.  Constitutional:      Appearance: Normal appearance. She is not ill-appearing.  Abdominal:     General: Bowel sounds are normal. There is no distension.     Palpations: Abdomen is soft. There is no mass.     Tenderness: There is no abdominal tenderness. There is no right CVA tenderness, left CVA tenderness, guarding or rebound. Negative signs include Murphy's sign.     Hernia: No hernia is present.  Skin:    General: Skin is warm and dry.     Findings: No rash.  Neurological:     Mental Status: She is alert.  Psychiatric:        Mood and Affect: Mood normal.        Behavior: Behavior normal.       Results for orders placed or performed in visit on 01/29/20  POCT Urinalysis Dipstick (Automated)  Result Value Ref Range   Color, UA yellow    Clarity, UA cloudy    Glucose, UA Negative Negative   Bilirubin, UA negative    Ketones, UA negative    Spec Grav, UA 1.015 1.010 - 1.025   Blood, UA negative    pH, UA 6.0 5.0 - 8.0   Protein, UA Negative Negative   Urobilinogen, UA 0.2 0.2 or 1.0 E.U./dL   Nitrite, UA positive    Leukocytes, UA Large (3+) (A) Negative   Assessment & Plan:  This visit occurred during the SARS-CoV-2 public health emergency.  Safety protocols were in place, including screening questions prior to the visit, additional usage of staff PPE, and extensive cleaning of exam room while observing appropriate contact time as  indicated for disinfecting solutions.   Problem List Items Addressed This Visit    UTI (urinary tract infection) - Primary    Story/exam consistent with acute simple cystitis (last one was 08/2018). Treat with keflex course (normally responds well to this). Update if not improving with treatment. Pt agrees with plan.       Relevant Medications   cephALEXin (KEFLEX) 500 MG capsule   Other Relevant Orders   Urine Culture    Other Visit Diagnoses    Abdominal pressure       Relevant Orders   POCT Urinalysis Dipstick (Automated) (Completed)       Meds ordered this encounter  Medications  . cephALEXin (KEFLEX) 500 MG capsule    Sig: Take 1 capsule (500 mg total) by mouth 2 (two) times daily.    Dispense:  14 capsule    Refill:  0   Orders Placed This Encounter  Procedures  . Urine Culture  . POCT Urinalysis Dipstick (Automated)    Patient Instructions  Story/urine test consistent with UTI. Treat with 1 wk keflex course. Urine culture sent.  Let us know if not improved with treatment.   Follow up plan: Return if symptoms worsen or fail to improve.  Ria Bush, MD

## 2020-02-01 DIAGNOSIS — Z23 Encounter for immunization: Secondary | ICD-10-CM | POA: Diagnosis not present

## 2020-02-01 LAB — URINE CULTURE
MICRO NUMBER:: 11033277
SPECIMEN QUALITY:: ADEQUATE

## 2020-02-05 ENCOUNTER — Telehealth: Payer: Self-pay | Admitting: *Deleted

## 2020-02-05 MED ORDER — CEPHALEXIN 500 MG PO CAPS: 500.0000 mg | ORAL_CAPSULE | Freq: Two times a day (BID) | ORAL | 0 refills | Status: DC

## 2020-02-05 NOTE — Telephone Encounter (Signed)
Pt notified as instructed.  Verbalizes understanding.

## 2020-02-05 NOTE — Telephone Encounter (Signed)
Patient called stating that she saw Dr. Danise Mina last week and was diagnosed with a UTI. Patient stated that she finished her antibiotics yesterday. Patient stated that all of her symptoms are gone with the exception of lower abdominal pressure. Patient denies a fever, burning or pain with urination. Patient stated that she has had a lot of UTI's and sometimes has to take two round of antibiotics to get over them. Patient wants to know if Dr. Danise Mina thinks that he should send in another round of antibiotics for her? [facility]

## 2020-02-05 NOTE — Telephone Encounter (Signed)
plz notify I've sent in another 5 days of keflex for her.

## 2020-02-14 ENCOUNTER — Other Ambulatory Visit (INDEPENDENT_AMBULATORY_CARE_PROVIDER_SITE_OTHER): Payer: Medicare Other

## 2020-02-14 ENCOUNTER — Other Ambulatory Visit: Payer: Self-pay

## 2020-02-14 DIAGNOSIS — D72820 Lymphocytosis (symptomatic): Secondary | ICD-10-CM

## 2020-02-14 NOTE — Addendum Note (Signed)
Addended by: Cloyd Stagers on: 02/14/2020 08:45 AM   Modules accepted: Orders

## 2020-02-15 ENCOUNTER — Encounter: Payer: Self-pay | Admitting: Family Medicine

## 2020-02-15 LAB — PATHOLOGIST SMEAR REVIEW

## 2020-02-15 LAB — CBC WITH DIFFERENTIAL/PLATELET
Absolute Monocytes: 517 cells/uL (ref 200–950)
Basophils Absolute: 82 cells/uL (ref 0–200)
Basophils Relative: 1.3 %
Eosinophils Absolute: 151 cells/uL (ref 15–500)
Eosinophils Relative: 2.4 %
HCT: 36.1 % (ref 35.0–45.0)
Hemoglobin: 11.9 g/dL (ref 11.7–15.5)
Lymphs Abs: 2785 cells/uL (ref 850–3900)
MCH: 31.5 pg (ref 27.0–33.0)
MCHC: 33 g/dL (ref 32.0–36.0)
MCV: 95.5 fL (ref 80.0–100.0)
MPV: 11 fL (ref 7.5–12.5)
Monocytes Relative: 8.2 %
Neutro Abs: 2766 cells/uL (ref 1500–7800)
Neutrophils Relative %: 43.9 %
Platelets: 326 10*3/uL (ref 140–400)
RBC: 3.78 10*6/uL — ABNORMAL LOW (ref 3.80–5.10)
RDW: 12.2 % (ref 11.0–15.0)
Total Lymphocyte: 44.2 %
WBC: 6.3 10*3/uL (ref 3.8–10.8)

## 2020-02-19 ENCOUNTER — Other Ambulatory Visit: Payer: Medicare Other

## 2020-03-10 ENCOUNTER — Other Ambulatory Visit: Payer: Medicare Other

## 2020-03-14 ENCOUNTER — Encounter: Payer: Self-pay | Admitting: Family Medicine

## 2020-03-25 DIAGNOSIS — H11003 Unspecified pterygium of eye, bilateral: Secondary | ICD-10-CM | POA: Diagnosis not present

## 2020-03-25 DIAGNOSIS — H52202 Unspecified astigmatism, left eye: Secondary | ICD-10-CM | POA: Diagnosis not present

## 2020-03-25 DIAGNOSIS — Z961 Presence of intraocular lens: Secondary | ICD-10-CM | POA: Diagnosis not present

## 2020-04-17 ENCOUNTER — Other Ambulatory Visit: Payer: Self-pay | Admitting: Family Medicine

## 2020-04-17 DIAGNOSIS — Z1231 Encounter for screening mammogram for malignant neoplasm of breast: Secondary | ICD-10-CM

## 2020-05-27 ENCOUNTER — Other Ambulatory Visit: Payer: Self-pay

## 2020-05-27 ENCOUNTER — Ambulatory Visit
Admission: RE | Admit: 2020-05-27 | Discharge: 2020-05-27 | Disposition: A | Discharge status: Home / Self Care | Payer: Medicare Other | Source: Ambulatory Visit | Attending: Family Medicine | Admitting: Family Medicine

## 2020-05-27 DIAGNOSIS — Z1231 Encounter for screening mammogram for malignant neoplasm of breast: Secondary | ICD-10-CM | POA: Diagnosis not present

## 2020-06-02 DIAGNOSIS — N1832 Chronic kidney disease, stage 3b: Secondary | ICD-10-CM | POA: Diagnosis not present

## 2020-06-09 DIAGNOSIS — I129 Hypertensive chronic kidney disease with stage 1 through stage 4 chronic kidney disease, or unspecified chronic kidney disease: Secondary | ICD-10-CM | POA: Diagnosis not present

## 2020-06-09 DIAGNOSIS — K589 Irritable bowel syndrome without diarrhea: Secondary | ICD-10-CM | POA: Diagnosis not present

## 2020-06-09 DIAGNOSIS — N1832 Chronic kidney disease, stage 3b: Secondary | ICD-10-CM | POA: Diagnosis not present

## 2020-06-09 DIAGNOSIS — N281 Cyst of kidney, acquired: Secondary | ICD-10-CM | POA: Diagnosis not present

## 2020-06-09 DIAGNOSIS — H8110 Benign paroxysmal vertigo, unspecified ear: Secondary | ICD-10-CM | POA: Diagnosis not present

## 2020-06-10 DIAGNOSIS — I872 Venous insufficiency (chronic) (peripheral): Secondary | ICD-10-CM | POA: Diagnosis not present

## 2020-06-10 DIAGNOSIS — Z85828 Personal history of other malignant neoplasm of skin: Secondary | ICD-10-CM | POA: Diagnosis not present

## 2020-06-10 DIAGNOSIS — I8311 Varicose veins of right lower extremity with inflammation: Secondary | ICD-10-CM | POA: Diagnosis not present

## 2020-06-10 DIAGNOSIS — I8312 Varicose veins of left lower extremity with inflammation: Secondary | ICD-10-CM | POA: Diagnosis not present

## 2020-06-12 ENCOUNTER — Other Ambulatory Visit: Payer: Self-pay | Admitting: Nephrology

## 2020-06-12 DIAGNOSIS — N1832 Chronic kidney disease, stage 3b: Secondary | ICD-10-CM

## 2020-06-17 ENCOUNTER — Ambulatory Visit
Admission: RE | Admit: 2020-06-17 | Discharge: 2020-06-17 | Disposition: A | Discharge status: Home / Self Care | Payer: Medicare Other | Source: Ambulatory Visit | Attending: Nephrology | Admitting: Nephrology

## 2020-06-17 DIAGNOSIS — N1832 Chronic kidney disease, stage 3b: Secondary | ICD-10-CM | POA: Diagnosis not present

## 2020-06-19 ENCOUNTER — Encounter: Payer: Self-pay | Admitting: Family Medicine

## 2020-06-24 ENCOUNTER — Other Ambulatory Visit: Payer: Self-pay | Admitting: Family Medicine

## 2020-06-24 DIAGNOSIS — L821 Other seborrheic keratosis: Secondary | ICD-10-CM

## 2020-06-24 HISTORY — DX: Other seborrheic keratosis: L82.1

## 2020-07-01 ENCOUNTER — Other Ambulatory Visit: Payer: Self-pay

## 2020-07-01 ENCOUNTER — Ambulatory Visit (INDEPENDENT_AMBULATORY_CARE_PROVIDER_SITE_OTHER): Payer: Medicare Other | Admitting: Family Medicine

## 2020-07-01 ENCOUNTER — Encounter: Payer: Self-pay | Admitting: Family Medicine

## 2020-07-01 VITALS — BP 154/60 | HR 74 | Temp 97.9°F | Ht 61.25 in | Wt 147.1 lb

## 2020-07-01 DIAGNOSIS — I872 Venous insufficiency (chronic) (peripheral): Secondary | ICD-10-CM | POA: Insufficient documentation

## 2020-07-01 DIAGNOSIS — N1832 Chronic kidney disease, stage 3b: Secondary | ICD-10-CM

## 2020-07-01 DIAGNOSIS — G629 Polyneuropathy, unspecified: Secondary | ICD-10-CM | POA: Insufficient documentation

## 2020-07-01 DIAGNOSIS — R202 Paresthesia of skin: Secondary | ICD-10-CM

## 2020-07-01 DIAGNOSIS — I10 Essential (primary) hypertension: Secondary | ICD-10-CM

## 2020-07-01 DIAGNOSIS — I83811 Varicose veins of right lower extremities with pain: Secondary | ICD-10-CM | POA: Diagnosis not present

## 2020-07-01 MED ORDER — AMLODIPINE BESYLATE 10 MG PO TABS: 10.0000 mg | ORAL_TABLET | Freq: Every day | ORAL | 1 refills | Status: DC

## 2020-07-01 NOTE — Progress Notes (Signed)
Patient ID: Carrie Lucas, female    DOB: [birth year], 80 y.o.   MRN: [id number]  This visit was conducted in person.  BP (!) 154/60 (BP Location: Right Arm, Cuff Size: Normal)   Pulse 74   Temp 97.9 F (36.6 C) (Temporal)   Ht 5' 1.25" (1.556 m)   Wt 147 lb 1 oz (66.7 kg)   SpO2 97%   BMI 27.56 kg/m   BP Readings from Last 3 Encounters:  07/01/20 (!) 154/60  01/29/20 124/68  01/07/20 (!) 136/58    Orthostatic VS for the past 24 hrs (Last 3 readings):  BP- Lying BP- Standing at 3 minutes  07/01/20 1247 - 150/64  07/01/20 1243 120/60 -    CC: left leg tingling, painful R foot Subjective:   HPI: Carrie Lucas is a 80 y.o. female presenting on 07/01/2020 for Tingling (C/o left leg tingling.  Started about 2 mos ago.  Also, pt brought in copy (to keep) of hers and husbands Medical POA. ) and Foot Pain (C/o a painful vein in right foot.  Started about 1 mo ago. Pain/swelling comes and goes. )   About to turn 80!  2 mo h/o L leg paresthesias - actually improving some. Also noted some swelling and redness associated with itching to left lower leg leading to excoriations. Saw dermatologist who first recommended steroid cream with benefit, then recurred so prescribed compression stockings with benefit. Also used menthol cream for legs. Tingling has actually improved with less swelling. Endorses chronic recurrent LLE dry skin dermatitis.    1 mo h/o R foot pain stemming from swollen vein on dorsal right foot. Foot can get swollen and very tender.   HTN - BP noted elevated today as well as at nephrologist's recent appointment - despite regularly taking vaslartan 320mg  daily and amlodipine 5mg  daily. Regularly sees nephrology, great readings on last check. Notes some orthostatic dizziness also present when bending over.      Relevant past medical, surgical, family and social history reviewed and updated as indicated. Interim medical history since our last visit reviewed. Allergies and  medications reviewed and updated. Outpatient Medications Prior to Visit  Medication Sig Dispense Refill  . acetaminophen (TYLENOL) 650 MG CR tablet Take 1 tablet (650 mg total) by mouth 3 (three) times daily as needed for pain.    Marland Kitchen aspirin EC 81 MG tablet Take 1 tablet (81 mg total) by mouth every Monday, Wednesday, and Friday.    Marland Kitchen atorvastatin (LIPITOR) 20 MG tablet Take 1 tablet by mouth once daily 90 tablet 3  . calcium carbonate (OS-CAL) 600 MG TABS tablet Take 1 tablet (600 mg total) by mouth daily with breakfast.    . Cholecalciferol (VITAMIN D) 2000 units CAPS Take 1 capsule by mouth daily.    . Multiple Vitamin (MULTIVITAMIN) tablet Take 1 tablet by mouth daily.    . valsartan (DIOVAN) 320 MG tablet Take 1 tablet by mouth once daily 90 tablet 2  . amLODipine (NORVASC) 5 MG tablet Take 1 tablet (5 mg total) by mouth daily. 90 tablet 3  . cephALEXin (KEFLEX) 500 MG capsule Take 1 capsule (500 mg total) by mouth 2 (two) times daily. 10 capsule 0   No facility-administered medications prior to visit.     Per HPI unless specifically indicated in ROS section below Review of Systems Objective:  BP (!) 154/60 (BP Location: Right Arm, Cuff Size: Normal)   Pulse 74   Temp 97.9 F (36.6 C) (Temporal)  Ht 5' 1.25" (1.556 m)   Wt 147 lb 1 oz (66.7 kg)   SpO2 97%   BMI 27.56 kg/m   Wt Readings from Last 3 Encounters:  07/01/20 147 lb 1 oz (66.7 kg)  01/29/20 145 lb (65.8 kg)  01/07/20 146 lb 8 oz (66.5 kg)      Physical Exam Vitals and nursing note reviewed.  Constitutional:      Appearance: Normal appearance. She is not ill-appearing.  Cardiovascular:     Rate and Rhythm: Normal rate and regular rhythm.     Pulses: Normal pulses.     Heart sounds: Normal heart sounds. No murmur heard.   Pulmonary:     Effort: Pulmonary effort is normal. No respiratory distress.     Breath sounds: Normal breath sounds. No wheezing, rhonchi or rales.  Musculoskeletal:        General:  Normal range of motion.     Right lower leg: No edema.     Left lower leg: No edema.     Comments:  1+ DP/PT bilaterally No significant pedal edema Dilated varicose vein to R dorsal midfoot   Skin:    General: Skin is warm and dry.     Findings: Erythema (slight to LLE) present. No rash.  Neurological:     Mental Status: She is alert.  Psychiatric:        Mood and Affect: Mood normal.        Behavior: Behavior normal.       Assessment & Plan:  This visit occurred during the SARS-CoV-2 public health emergency.  Safety protocols were in place, including screening questions prior to the visit, additional usage of staff PPE, and extensive cleaning of exam room while observing appropriate contact time as indicated for disinfecting solutions.   Problem List Items Addressed This Visit    Varicose veins of leg with pain, right    To R foot - supportive care reviewed. Try voltaren gel topically.       Relevant Medications   amLODipine (NORVASC) 10 MG tablet   Paresthesia of left lower extremity    Anticipate compressive neuropathy as symptoms worse when leg edema worse, now improved as pedal edema has improved.  Consider tsh, b12 if ongoing symptoms.       HTN (hypertension)    BP elevated today and at recent renal appointment. Will increase amlodipine to 10mg  daily, monitoring for ankle swelling. Update if this occurs.       Relevant Medications   amLODipine (NORVASC) 10 MG tablet   CKD (chronic kidney disease) stage 3, GFR 30-59 ml/min ([facility])    Appreciate nephrology care, recent readings overall stable.       Chronic venous insufficiency of lower extremity - Primary    Anticipate contributing to leg symptoms.  Discussed supportive care - to restart compression stockings.       Relevant Medications   amLODipine (NORVASC) 10 MG tablet       Meds ordered this encounter  Medications  . amLODipine (NORVASC) 10 MG tablet    Sig: Take 1 tablet (10 mg total) by mouth daily.     Dispense:  90 tablet    Refill:  1    Note new dose   No orders of the defined types were placed in this encounter.   Patient Instructions  Tingling could be coming from compression of nerves when legs get swollen.  I think right foot pain is coming from inflamed varicose vein. I think you  have component of chronic venous insufficiency.  Treat with leg elevation, regular use compression stockings, drinking plenty of water, avoiding salt/sodium in the diet.  May use small amount of voltaren topical anti inflammatory gel to tender spot on right foot.  Increase amlodipine to 10mg  daily - watch for ankle swelling on this higher dose.    Follow up plan: Return if symptoms worsen or fail to improve.  Ria Bush, MD

## 2020-07-01 NOTE — Assessment & Plan Note (Addendum)
Anticipate compressive neuropathy as symptoms worse when leg edema worse, now improved as pedal edema has improved.  Consider tsh, b12 if ongoing symptoms.

## 2020-07-01 NOTE — Assessment & Plan Note (Signed)
To R foot - supportive care reviewed. Try voltaren gel topically.

## 2020-07-01 NOTE — Assessment & Plan Note (Signed)
Anticipate contributing to leg symptoms.  Discussed supportive care - to restart compression stockings.

## 2020-07-01 NOTE — Assessment & Plan Note (Signed)
Appreciate nephrology care, recent readings overall stable.

## 2020-07-01 NOTE — Patient Instructions (Addendum)
Tingling could be coming from compression of nerves when legs get swollen.  I think right foot pain is coming from inflamed varicose vein. I think you have component of chronic venous insufficiency.  Treat with leg elevation, regular use compression stockings, drinking plenty of water, avoiding salt/sodium in the diet.  May use small amount of voltaren topical anti inflammatory gel to tender spot on right foot.  Increase amlodipine to 10mg  daily - watch for ankle swelling on this higher dose.

## 2020-07-01 NOTE — Assessment & Plan Note (Signed)
BP elevated today and at recent renal appointment. Will increase amlodipine to 10mg  daily, monitoring for ankle swelling. Update if this occurs.

## 2020-07-10 ENCOUNTER — Other Ambulatory Visit: Payer: Self-pay | Admitting: Family Medicine

## 2020-07-10 DIAGNOSIS — N1832 Chronic kidney disease, stage 3b: Secondary | ICD-10-CM

## 2020-07-10 DIAGNOSIS — E782 Mixed hyperlipidemia: Secondary | ICD-10-CM

## 2020-07-11 ENCOUNTER — Other Ambulatory Visit: Payer: Self-pay

## 2020-07-11 ENCOUNTER — Other Ambulatory Visit (INDEPENDENT_AMBULATORY_CARE_PROVIDER_SITE_OTHER): Payer: Medicare Other

## 2020-07-11 DIAGNOSIS — N1832 Chronic kidney disease, stage 3b: Secondary | ICD-10-CM | POA: Diagnosis not present

## 2020-07-11 DIAGNOSIS — E782 Mixed hyperlipidemia: Secondary | ICD-10-CM | POA: Diagnosis not present

## 2020-07-11 LAB — CBC WITH DIFFERENTIAL/PLATELET
Basophils Absolute: 0.1 10*3/uL (ref 0.0–0.1)
Basophils Relative: 0.9 % (ref 0.0–3.0)
Eosinophils Absolute: 0.4 10*3/uL (ref 0.0–0.7)
Eosinophils Relative: 5.9 % — ABNORMAL HIGH (ref 0.0–5.0)
HCT: 36.3 % (ref 36.0–46.0)
Hemoglobin: 12.3 g/dL (ref 12.0–15.0)
Lymphocytes Relative: 43.6 % (ref 12.0–46.0)
Lymphs Abs: 3 10*3/uL (ref 0.7–4.0)
MCHC: 33.9 g/dL (ref 30.0–36.0)
MCV: 94.2 fl (ref 78.0–100.0)
Monocytes Absolute: 0.5 10*3/uL (ref 0.1–1.0)
Monocytes Relative: 7.6 % (ref 3.0–12.0)
Neutro Abs: 2.9 10*3/uL (ref 1.4–7.7)
Neutrophils Relative %: 42 % — ABNORMAL LOW (ref 43.0–77.0)
Platelets: 309 10*3/uL (ref 150.0–400.0)
RBC: 3.85 Mil/uL — ABNORMAL LOW (ref 3.87–5.11)
RDW: 12.6 % (ref 11.5–15.5)
WBC: 6.8 10*3/uL (ref 4.0–10.5)

## 2020-07-11 LAB — LIPID PANEL
Cholesterol: 115 mg/dL (ref 0–200)
HDL: 43.4 mg/dL (ref >39.00)
LDL Cholesterol: 37 mg/dL (ref 0–99)
NonHDL: 71.43
Total CHOL/HDL Ratio: 3
Triglycerides: 171 mg/dL — ABNORMAL HIGH (ref 0.0–149.0)
VLDL: 34.2 mg/dL (ref 0.0–40.0)

## 2020-07-11 LAB — COMPREHENSIVE METABOLIC PANEL
ALT: 14 U/L (ref 0–35)
AST: 17 U/L (ref 0–37)
Albumin: 4.1 g/dL (ref 3.5–5.2)
Alkaline Phosphatase: 78 U/L (ref 39–117)
BUN: 19 mg/dL (ref 6–23)
CO2: 27 mEq/L (ref 19–32)
Calcium: 9.9 mg/dL (ref 8.4–10.5)
Chloride: 107 mEq/L (ref 96–112)
Creatinine, Ser: 1.27 mg/dL — ABNORMAL HIGH (ref 0.40–1.20)
GFR: 40.08 mL/min — ABNORMAL LOW (ref >60.00)
Glucose, Bld: 96 mg/dL (ref 70–99)
Potassium: 4.7 mEq/L (ref 3.5–5.1)
Sodium: 142 mEq/L (ref 135–145)
Total Bilirubin: 0.5 mg/dL (ref 0.2–1.2)
Total Protein: 6.6 g/dL (ref 6.0–8.3)

## 2020-07-11 LAB — VITAMIN D 25 HYDROXY (VIT D DEFICIENCY, FRACTURES): VITD: 66.56 ng/mL (ref 30.00–100.00)

## 2020-07-14 LAB — PARATHYROID HORMONE, INTACT (NO CA): PTH: 38 pg/mL (ref 16–77)

## 2020-07-16 ENCOUNTER — Ambulatory Visit (INDEPENDENT_AMBULATORY_CARE_PROVIDER_SITE_OTHER): Payer: Medicare Other | Admitting: Family Medicine

## 2020-07-16 ENCOUNTER — Encounter: Payer: Self-pay | Admitting: Family Medicine

## 2020-07-16 ENCOUNTER — Other Ambulatory Visit: Payer: Self-pay

## 2020-07-16 DIAGNOSIS — E782 Mixed hyperlipidemia: Secondary | ICD-10-CM

## 2020-07-16 DIAGNOSIS — I872 Venous insufficiency (chronic) (peripheral): Secondary | ICD-10-CM | POA: Diagnosis not present

## 2020-07-16 DIAGNOSIS — I10 Essential (primary) hypertension: Secondary | ICD-10-CM

## 2020-07-16 DIAGNOSIS — R21 Rash and other nonspecific skin eruption: Secondary | ICD-10-CM | POA: Diagnosis not present

## 2020-07-16 DIAGNOSIS — N1832 Chronic kidney disease, stage 3b: Secondary | ICD-10-CM | POA: Diagnosis not present

## 2020-07-16 NOTE — Assessment & Plan Note (Signed)
Has established with Dr Royce Macadamia at [facility]. Appreciate renal care. Stable to improved readings over the past year.

## 2020-07-16 NOTE — Assessment & Plan Note (Signed)
Now regularly using compression stocking

## 2020-07-16 NOTE — Assessment & Plan Note (Signed)
Chronic, improved since increased amlodipine to 10mg  daily - tolerating well. Continue current regimen.

## 2020-07-16 NOTE — Progress Notes (Signed)
Patient ID: Carrie Lucas, female    DOB: 02/17/1941, 80 y.o.   MRN: [id number]  This visit was conducted in person.  BP 132/60 (BP Location: Left Arm, Patient Position: Sitting, Cuff Size: Normal)   Pulse 76   Temp (!) 97.5 F (36.4 C) (Temporal)   Ht 5\' 1"  (1.549 m)   Wt 146 lb (66.2 kg)   SpO2 96%   BMI 27.59 kg/m    CC: 6 mo f/u visit  Subjective:   HPI: Carrie Lucas is a 80 y.o. female presenting on 07/16/2020 for Follow-up (6 month f/u)   Recent 80th birthday   CVI - recently restarted 20-10mmHg compression stocking use - hasn't noticed improvement with this. Ongoing itchy red rash to left lower leg, not affecting right side. Ongoing rash since 04/2020. Seeing dermatology for this.   CKD stage 3 - regularly seeing nephrologist. Thought due to hypertensive nephropathy.   HTN - Compliant with current antihypertensive regimen of amlodipine 10mg  daily (recent increase) and valsartan 320mg  daily. Does check blood pressures at home: improved readings. Tolerating amlodipine well without increased swelling or dizziness. No significant low blood pressure readings or symptoms of dizziness/syncope. Denies HA, vision changes, CP/tightness, SOB, leg swelling.     Relevant past medical, surgical, family and social history reviewed and updated as indicated. Interim medical history since our last visit reviewed. Allergies and medications reviewed and updated. Outpatient Medications Prior to Visit  Medication Sig Dispense Refill  . acetaminophen (TYLENOL) 650 MG CR tablet Take 1 tablet (650 mg total) by mouth 3 (three) times daily as needed for pain.    Marland Kitchen amLODipine (NORVASC) 10 MG tablet Take 1 tablet (10 mg total) by mouth daily. 90 tablet 1  . aspirin EC 81 MG tablet Take 1 tablet (81 mg total) by mouth every Monday, Wednesday, and Friday.    Marland Kitchen atorvastatin (LIPITOR) 20 MG tablet Take 1 tablet by mouth once daily 90 tablet 3  . calcium carbonate (OS-CAL) 600 MG TABS tablet Take 1  tablet (600 mg total) by mouth daily with breakfast.    . Cholecalciferol (VITAMIN D) 2000 units CAPS Take 1 capsule by mouth daily.    . Multiple Vitamin (MULTIVITAMIN) tablet Take 1 tablet by mouth daily.    . valsartan (DIOVAN) 320 MG tablet Take 1 tablet by mouth once daily 90 tablet 2   No facility-administered medications prior to visit.     Per HPI unless specifically indicated in ROS section below Review of Systems Objective:  BP 132/60 (BP Location: Left Arm, Patient Position: Sitting, Cuff Size: Normal)   Pulse 76   Temp (!) 97.5 F (36.4 C) (Temporal)   Ht 5\' 1"  (1.549 m)   Wt 146 lb (66.2 kg)   SpO2 96%   BMI 27.59 kg/m   Wt Readings from Last 3 Encounters:  07/16/20 146 lb (66.2 kg)  07/01/20 147 lb 1 oz (66.7 kg)  01/29/20 145 lb (65.8 kg)      Physical Exam Vitals and nursing note reviewed.  Constitutional:      Appearance: Normal appearance. She is not ill-appearing.  Cardiovascular:     Rate and Rhythm: Normal rate and regular rhythm.     Pulses: Normal pulses.     Heart sounds: Murmur (2/6 systolic USB) heard.    Pulmonary:     Effort: Pulmonary effort is normal. No respiratory distress.     Breath sounds: Normal breath sounds. No wheezing, rhonchi or rales.  Musculoskeletal:  Right lower leg: No edema.     Left lower leg: No edema.  Skin:    General: Skin is warm and dry.     Findings: Erythema and rash present.     Comments: Red itchy macular blanching rash to left anterior lower leg   Neurological:     Mental Status: She is alert.  Psychiatric:        Mood and Affect: Mood normal.        Behavior: Behavior normal.       Results for orders placed or performed in visit on 07/11/20  Parathyroid hormone, intact (no Ca)  Result Value Ref Range   PTH 38 16 - 77 pg/mL  VITAMIN D 25 Hydroxy (Vit-D Deficiency, Fractures)  Result Value Ref Range   VITD 66.56 30.00 - 100.00 ng/mL  CBC with Differential/Platelet  Result Value Ref Range   WBC  6.8 4.0 - 10.5 K/uL   RBC 3.85 (L) 3.87 - 5.11 Mil/uL   Hemoglobin 12.3 12.0 - 15.0 g/dL   HCT 36.3 36.0 - 46.0 %   MCV 94.2 78.0 - 100.0 fl   MCHC 33.9 30.0 - 36.0 g/dL   RDW 12.6 11.5 - 15.5 %   Platelets 309.0 150.0 - 400.0 K/uL   Neutrophils Relative % 42.0 (L) 43.0 - 77.0 %   Lymphocytes Relative 43.6 12.0 - 46.0 %   Monocytes Relative 7.6 3.0 - 12.0 %   Eosinophils Relative 5.9 (H) 0.0 - 5.0 %   Basophils Relative 0.9 0.0 - 3.0 %   Neutro Abs 2.9 1.4 - 7.7 K/uL   Lymphs Abs 3.0 0.7 - 4.0 K/uL   Monocytes Absolute 0.5 0.1 - 1.0 K/uL   Eosinophils Absolute 0.4 0.0 - 0.7 K/uL   Basophils Absolute 0.1 0.0 - 0.1 K/uL  Comprehensive metabolic panel  Result Value Ref Range   Sodium 142 135 - 145 mEq/L   Potassium 4.7 3.5 - 5.1 mEq/L   Chloride 107 96 - 112 mEq/L   CO2 27 19 - 32 mEq/L   Glucose, Bld 96 70 - 99 mg/dL   BUN 19 6 - 23 mg/dL   Creatinine, Ser 1.27 (H) 0.40 - 1.20 mg/dL   Total Bilirubin 0.5 0.2 - 1.2 mg/dL   Alkaline Phosphatase 78 39 - 117 U/L   AST 17 0 - 37 U/L   ALT 14 0 - 35 U/L   Total Protein 6.6 6.0 - 8.3 g/dL   Albumin 4.1 3.5 - 5.2 g/dL   GFR 40.08 (L) >60.00 mL/min   Calcium 9.9 8.4 - 10.5 mg/dL  Lipid panel  Result Value Ref Range   Cholesterol 115 0 - 200 mg/dL   Triglycerides 171.0 (H) 0.0 - 149.0 mg/dL   HDL 43.40 >39.00 mg/dL   VLDL 34.2 0.0 - 40.0 mg/dL   LDL Cholesterol 37 0 - 99 mg/dL   Total CHOL/HDL Ratio 3    NonHDL 71.43    Assessment & Plan:  This visit occurred during the SARS-CoV-2 public health emergency.  Safety protocols were in place, including screening questions prior to the visit, additional usage of staff PPE, and extensive cleaning of exam room while observing appropriate contact time as indicated for disinfecting solutions.   Problem List Items Addressed This Visit    HLD (hyperlipidemia) (Chronic)    Chronic, great control on atorvastatin with LDL 30s. The ASCVD Risk score Mikey Bussing DC Jr., et al., 2013) failed to  calculate for the following reasons:   The 2013 ASCVD  risk score is only valid for ages 56 to 73       HTN (hypertension)    Chronic, improved since increased amlodipine to 10mg  daily - tolerating well. Continue current regimen.       CKD (chronic kidney disease) stage 3, GFR 30-59 ml/min ([facility])    Has established with Dr Royce Macadamia at [facility]. Appreciate renal care. Stable to improved readings over the past year.       Chronic venous insufficiency of lower extremity    Now regularly using compression stocking      Skin rash    Unilateral to left anterior lower leg - anticipate related to chronic venous insufficiency. She has f/u planned with dermatology this coming Monday.           No orders of the defined types were placed in this encounter.  No orders of the defined types were placed in this encounter.   Patient Instructions  Happy Rudene Anda!  Return in 6 months for wellness visit  Kidneys are very stable to improved.  Continue current medicines - blood pressures are better controlled.    Follow up plan: Return in about 6 months (around 01/16/2021) for follow up visit, medicare wellness visit.  Ria Bush, MD

## 2020-07-16 NOTE — Assessment & Plan Note (Addendum)
Chronic, great control on atorvastatin with LDL 30s. The ASCVD Risk score Mikey Bussing DC Jr., et al., 2013) failed to calculate for the following reasons:   The 2013 ASCVD risk score is only valid for ages 79 to 59

## 2020-07-16 NOTE — Patient Instructions (Addendum)
Happy Birthday!  Return in 6 months for wellness visit  Kidneys are very stable to improved.  Continue current medicines - blood pressures are better controlled.

## 2020-07-16 NOTE — Assessment & Plan Note (Signed)
Unilateral to left anterior lower leg - anticipate related to chronic venous insufficiency. She has f/u planned with dermatology this coming Monday.

## 2020-07-21 DIAGNOSIS — D1801 Hemangioma of skin and subcutaneous tissue: Secondary | ICD-10-CM | POA: Diagnosis not present

## 2020-07-21 DIAGNOSIS — D692 Other nonthrombocytopenic purpura: Secondary | ICD-10-CM | POA: Diagnosis not present

## 2020-07-21 DIAGNOSIS — I872 Venous insufficiency (chronic) (peripheral): Secondary | ICD-10-CM | POA: Diagnosis not present

## 2020-07-21 DIAGNOSIS — L814 Other melanin hyperpigmentation: Secondary | ICD-10-CM | POA: Diagnosis not present

## 2020-07-21 DIAGNOSIS — L821 Other seborrheic keratosis: Secondary | ICD-10-CM | POA: Diagnosis not present

## 2020-07-21 DIAGNOSIS — Z85828 Personal history of other malignant neoplasm of skin: Secondary | ICD-10-CM | POA: Diagnosis not present

## 2020-07-21 DIAGNOSIS — I8312 Varicose veins of left lower extremity with inflammation: Secondary | ICD-10-CM | POA: Diagnosis not present

## 2020-07-21 DIAGNOSIS — L918 Other hypertrophic disorders of the skin: Secondary | ICD-10-CM | POA: Diagnosis not present

## 2020-07-21 DIAGNOSIS — L570 Actinic keratosis: Secondary | ICD-10-CM | POA: Diagnosis not present

## 2020-07-21 DIAGNOSIS — D485 Neoplasm of uncertain behavior of skin: Secondary | ICD-10-CM | POA: Diagnosis not present

## 2020-07-21 DIAGNOSIS — I8311 Varicose veins of right lower extremity with inflammation: Secondary | ICD-10-CM | POA: Diagnosis not present

## 2020-07-30 ENCOUNTER — Encounter: Payer: Self-pay | Admitting: Family Medicine

## 2020-08-17 ENCOUNTER — Encounter: Payer: Self-pay | Admitting: Family Medicine

## 2020-08-17 DIAGNOSIS — I10 Essential (primary) hypertension: Secondary | ICD-10-CM

## 2020-09-29 ENCOUNTER — Other Ambulatory Visit: Payer: Self-pay | Admitting: Family Medicine

## 2020-09-30 NOTE — Telephone Encounter (Signed)
ERx 

## 2020-12-09 DIAGNOSIS — J209 Acute bronchitis, unspecified: Secondary | ICD-10-CM | POA: Diagnosis not present

## 2020-12-09 DIAGNOSIS — Z20822 Contact with and (suspected) exposure to covid-19: Secondary | ICD-10-CM | POA: Diagnosis not present

## 2020-12-09 DIAGNOSIS — R059 Cough, unspecified: Secondary | ICD-10-CM | POA: Diagnosis not present

## 2020-12-09 DIAGNOSIS — R519 Headache, unspecified: Secondary | ICD-10-CM | POA: Diagnosis not present

## 2020-12-09 DIAGNOSIS — J029 Acute pharyngitis, unspecified: Secondary | ICD-10-CM | POA: Diagnosis not present

## 2020-12-19 ENCOUNTER — Telehealth (INDEPENDENT_AMBULATORY_CARE_PROVIDER_SITE_OTHER): Payer: Medicare Other | Admitting: Family Medicine

## 2020-12-19 VITALS — Temp 100.2°F

## 2020-12-19 DIAGNOSIS — U071 COVID-19: Secondary | ICD-10-CM

## 2020-12-19 MED ORDER — MOLNUPIRAVIR EUA 200MG CAPSULE: 4.0000 | ORAL_CAPSULE | Freq: Two times a day (BID) | ORAL | 0 refills | Status: AC

## 2020-12-19 NOTE — Progress Notes (Signed)
Virtual Visit via Video Note Pt unable to get her video working.  Visit was audio only.  I connected with Carrie Lucas on 12/19/20 at  3:30 PM EDT by a video enabled telemedicine application 2/2 [id number] pandemic and verified that I am speaking with the correct person using two identifiers.  Location patient: home Location provider:work or home office Persons participating in the virtual visit: patient, provider  I discussed the limitations of evaluation and management by telemedicine and the availability of in person appointments. The patient expressed understanding and agreed to proceed.  Chief Complaint  Patient presents with   Covid Positive    Home test results positive today, has cough, fever 100.2, and diarrhea. UC in [place] tests were negative but was given a Z-pack and symptoms got worse.    HPI: Pt developed respiratory sx while in [place] last wk.  Seen at [facility] for sore throat, sneezing, rhinorrhea that seemed to improve.  Last night started coughing, temp 100.57F, HA, and diarrhea.  Had a positive home COVID test today. Taking Tylenol, Delsym. Pt with good appetite.  Denies loss of taste or smell, ear pain or pressure, sick contacts.  Pt had 2 COVID vaccines and a booster.  ROS: See pertinent positives and negatives per HPI.  Past Medical History:  Diagnosis Date   Arthritis    R middle finger, chronic arthralgias   BCC (basal cell carcinoma of skin) 06/2015, 06/2017   supraclavicular, nasal bridge, R lower back - [name]   Carotid stenosis, asymptomatic 12/16/2014   Mild bilateral 1-39%, consider rpt [place] 1 yr (11/2014)    Chronic renal disease, stage III (HCC)    Colon polyp    DDD (degenerative disc disease), lumbar 2014   severe facet degeneration L4/5, mod spinal setnosis, mild foraminal stenosis   Headache    Hepatic steatosis 2012   mild by CT   History of melanoma 2003   face (Dr Luetta Nutting)   HLD (hyperlipidemia)    HTN (hypertension)    IBS (irritable bowel syndrome)     Insomnia    LPRD (laryngopharyngeal reflux disease)    [facility])   Osteopenia 2011   DEXA T -1.6 hip, -1.3 forearm (12/2015)   Sialadenitis 05/2013   acute [facility])   SK (seborrheic keratosis) 06/2020   L lateral upper back ([name])    Past Surgical History:  Procedure Laterality Date   ABI  06/2010   WNL   BACK SURGERY  1995   lumbar   BILATERAL OOPHORECTOMY  2007   ovarian cysts   BLADDER SUSPENSION     midurethral sling for SUI   CARPAL TUNNEL RELEASE Bilateral 2000   COLONOSCOPY  '8 30 12   '$ TAx1, diverticulosis, rpt 5 yrs ([facility])   COLONOSCOPY  03/2016   WNL (Out[facility])   DEXA  2011   mild osteopenia (T -1.2 R femur)   ESI  2014   L L4 transforaminal   ESOPHAGOGASTRODUODENOSCOPY  12/24/2010   reflux esophagitis   MOHS SURGERY  2016   BCC   RADIAL KERATOTOMY Left    TONSILLECTOMY  Childhood   VAGINAL HYSTERECTOMY     bladder drop    Family History  Problem Relation Age of Onset   Cancer Sister        breast   CAD Sister    Pulmonary fibrosis Mother 21   Diabetes Father    Hypertension Father    Stroke Father 41   Hyperlipidemia Father    CAD Sister  Cancer Sister        breast   Stroke Sister     Current Outpatient Medications:    acetaminophen (TYLENOL) 650 MG CR tablet, Take 1 tablet (650 mg total) by mouth 3 (three) times daily as needed for pain., Disp: , Rfl:    amLODipine (NORVASC) 5 MG tablet, Take 1 tablet by mouth once daily, Disp: 90 tablet, Rfl: 1   aspirin EC 81 MG tablet, Take 1 tablet (81 mg total) by mouth every Monday, Wednesday, and Friday., Disp: , Rfl:    atorvastatin (LIPITOR) 20 MG tablet, Take 1 tablet by mouth once daily, Disp: 90 tablet, Rfl: 3   calcium carbonate (OS-CAL) 600 MG TABS tablet, Take 1 tablet (600 mg total) by mouth daily with breakfast., Disp: , Rfl:    Cholecalciferol (VITAMIN D) 2000 units CAPS, Take 1 capsule by mouth daily., Disp: , Rfl:    Multiple Vitamin (MULTIVITAMIN) tablet, Take 1 tablet by mouth daily.,  Disp: , Rfl:    valsartan (DIOVAN) 320 MG tablet, Take 1 tablet by mouth once daily, Disp: 90 tablet, Rfl: 2  EXAM:  VITALS per patient if applicable:  GENERAL: alert, oriented, pleasant and in no acute distress  LUNGS: intermittent dry cough  PSYCH/NEURO: pleasant and cooperative, no obvious depression or anxiety, speech and thought processing grossly intact  ASSESSMENT AND PLAN:  Discussed the following assessment and plan:  COVID-19 virus infection  -mild COVID symptoms -positive at home test 12/19/20 -continue supportive care including rest, hydration, OTC cough/cold meds -discussed r/b/a of antiviral medication.  -given precautinons  - Plan: molnupiravir EUA 200 mg CAPS  F/u prn   I discussed the assessment and treatment plan with the patient. The patient was provided an opportunity to ask questions and all were answered. The patient agreed with the plan and demonstrated an understanding of the instructions.   The patient was advised to call back or seek an in-person evaluation if the symptoms worsen or if the condition fails to improve as anticipated.  I provided 11 minutes of non-face-to-face time during this encounter as pt could not get her video to work.   Billie Ruddy, MD

## 2020-12-22 ENCOUNTER — Telehealth: Payer: Self-pay

## 2020-12-22 NOTE — Telephone Encounter (Signed)
This is not typical side effect of molnupiravir - so either COVID related diarrhea or something else.  Continue pushing small sips of fluids throughout the day, may try imodium if no abd pain or fever or blood in stool.  Cut valsartan in half or hold while ongoing diarrhea (depending on how blood pressure readings are doing).  If not improving over time or any worsening, recommend in person evaluation at [facility] or ER.

## 2020-12-22 NOTE — Telephone Encounter (Signed)
Spoke with pt relaying Dr. G's message. Pt verbalizes understanding.  

## 2020-12-22 NOTE — Telephone Encounter (Signed)
Virtual with Dr Volanda Napoleon on Friday. Started on Molnupiravir on Friday night. Loose stools started Friday before she started medications. But has increased after starting mediations. Stools are water to the point where in the middle of the night she did not wake up in time and had to change bed sheets. And has had to change clothes several time. She has had loose stools 8-10 times and now is almost clear. Has improved a little yesterday and today. Denies any abdominal pain or blood in stool. She is weak but has been trying to drink as much water as she can. She has been drinking 4 16oz bottles a day of water. Some nausea but no vomiting. She has not had any dry mouth or eyes.

## 2020-12-31 ENCOUNTER — Other Ambulatory Visit: Payer: Self-pay | Admitting: Family Medicine

## 2021-01-10 ENCOUNTER — Other Ambulatory Visit: Payer: Self-pay | Admitting: Family Medicine

## 2021-01-10 DIAGNOSIS — D72820 Lymphocytosis (symptomatic): Secondary | ICD-10-CM

## 2021-01-10 DIAGNOSIS — E782 Mixed hyperlipidemia: Secondary | ICD-10-CM

## 2021-01-10 DIAGNOSIS — N1832 Chronic kidney disease, stage 3b: Secondary | ICD-10-CM

## 2021-01-14 ENCOUNTER — Other Ambulatory Visit (INDEPENDENT_AMBULATORY_CARE_PROVIDER_SITE_OTHER): Payer: Medicare Other

## 2021-01-14 ENCOUNTER — Other Ambulatory Visit: Payer: Self-pay

## 2021-01-14 DIAGNOSIS — E782 Mixed hyperlipidemia: Secondary | ICD-10-CM | POA: Diagnosis not present

## 2021-01-14 DIAGNOSIS — N1832 Chronic kidney disease, stage 3b: Secondary | ICD-10-CM

## 2021-01-14 LAB — COMPREHENSIVE METABOLIC PANEL
ALT: 11 U/L (ref 0–35)
AST: 15 U/L (ref 0–37)
Albumin: 4.2 g/dL (ref 3.5–5.2)
Alkaline Phosphatase: 82 U/L (ref 39–117)
BUN: 16 mg/dL (ref 6–23)
CO2: 27 mEq/L (ref 19–32)
Calcium: 9.9 mg/dL (ref 8.4–10.5)
Chloride: 106 mEq/L (ref 96–112)
Creatinine, Ser: 1.23 mg/dL — ABNORMAL HIGH (ref 0.40–1.20)
GFR: 41.5 mL/min — ABNORMAL LOW (ref >60.00)
Glucose, Bld: 95 mg/dL (ref 70–99)
Potassium: 4.5 mEq/L (ref 3.5–5.1)
Sodium: 141 mEq/L (ref 135–145)
Total Bilirubin: 0.6 mg/dL (ref 0.2–1.2)
Total Protein: 6.8 g/dL (ref 6.0–8.3)

## 2021-01-14 LAB — CBC WITH DIFFERENTIAL/PLATELET
Basophils Absolute: 0 10*3/uL (ref 0.0–0.1)
Basophils Relative: 0.8 % (ref 0.0–3.0)
Eosinophils Absolute: 0.2 10*3/uL (ref 0.0–0.7)
Eosinophils Relative: 3.7 % (ref 0.0–5.0)
HCT: 35.8 % — ABNORMAL LOW (ref 36.0–46.0)
Hemoglobin: 11.9 g/dL — ABNORMAL LOW (ref 12.0–15.0)
Lymphocytes Relative: 52.7 % — ABNORMAL HIGH (ref 12.0–46.0)
Lymphs Abs: 2.8 10*3/uL (ref 0.7–4.0)
MCHC: 33.2 g/dL (ref 30.0–36.0)
MCV: 95 fl (ref 78.0–100.0)
Monocytes Absolute: 0.5 10*3/uL (ref 0.1–1.0)
Monocytes Relative: 9 % (ref 3.0–12.0)
Neutro Abs: 1.8 10*3/uL (ref 1.4–7.7)
Neutrophils Relative %: 33.8 % — ABNORMAL LOW (ref 43.0–77.0)
Platelets: 281 10*3/uL (ref 150.0–400.0)
RBC: 3.77 Mil/uL — ABNORMAL LOW (ref 3.87–5.11)
RDW: 13.1 % (ref 11.5–15.5)
WBC: 5.3 10*3/uL (ref 4.0–10.5)

## 2021-01-14 LAB — PHOSPHORUS: Phosphorus: 3.9 mg/dL (ref 2.3–4.6)

## 2021-01-14 LAB — LIPID PANEL
Cholesterol: 118 mg/dL (ref 0–200)
HDL: 40 mg/dL (ref >39.00)
LDL Cholesterol: 47 mg/dL (ref 0–99)
NonHDL: 78.17
Total CHOL/HDL Ratio: 3
Triglycerides: 155 mg/dL — ABNORMAL HIGH (ref 0.0–149.0)
VLDL: 31 mg/dL (ref 0.0–40.0)

## 2021-01-14 LAB — VITAMIN D 25 HYDROXY (VIT D DEFICIENCY, FRACTURES): VITD: 58.89 ng/mL (ref 30.00–100.00)

## 2021-01-15 LAB — PARATHYROID HORMONE, INTACT (NO CA): PTH: 31 pg/mL (ref 16–77)

## 2021-01-16 ENCOUNTER — Ambulatory Visit: Payer: Medicare Other

## 2021-01-16 DIAGNOSIS — Z23 Encounter for immunization: Secondary | ICD-10-CM | POA: Diagnosis not present

## 2021-01-20 ENCOUNTER — Ambulatory Visit (INDEPENDENT_AMBULATORY_CARE_PROVIDER_SITE_OTHER)
Admission: RE | Admit: 2021-01-20 | Discharge: 2021-01-20 | Disposition: A | Discharge status: Home / Self Care | Payer: Medicare Other | Source: Ambulatory Visit | Attending: Family Medicine | Admitting: Family Medicine

## 2021-01-20 ENCOUNTER — Encounter: Payer: Self-pay | Admitting: Family Medicine

## 2021-01-20 ENCOUNTER — Ambulatory Visit
Admission: RE | Admit: 2021-01-20 | Discharge: 2021-01-20 | Disposition: A | Discharge status: Home / Self Care | Payer: Medicare Other | Source: Ambulatory Visit | Attending: Family Medicine | Admitting: Family Medicine

## 2021-01-20 ENCOUNTER — Ambulatory Visit (INDEPENDENT_AMBULATORY_CARE_PROVIDER_SITE_OTHER): Payer: Medicare Other | Admitting: Family Medicine

## 2021-01-20 ENCOUNTER — Other Ambulatory Visit: Payer: Self-pay

## 2021-01-20 VITALS — BP 132/62 | HR 80 | Temp 97.9°F | Ht 61.25 in | Wt 148.2 lb

## 2021-01-20 DIAGNOSIS — M159 Polyosteoarthritis, unspecified: Secondary | ICD-10-CM

## 2021-01-20 DIAGNOSIS — H9193 Unspecified hearing loss, bilateral: Secondary | ICD-10-CM

## 2021-01-20 DIAGNOSIS — M189 Osteoarthritis of first carpometacarpal joint, unspecified: Secondary | ICD-10-CM | POA: Diagnosis not present

## 2021-01-20 DIAGNOSIS — M79641 Pain in right hand: Secondary | ICD-10-CM

## 2021-01-20 DIAGNOSIS — M85852 Other specified disorders of bone density and structure, left thigh: Secondary | ICD-10-CM | POA: Diagnosis not present

## 2021-01-20 DIAGNOSIS — Z0000 Encounter for general adult medical examination without abnormal findings: Secondary | ICD-10-CM

## 2021-01-20 DIAGNOSIS — M154 Erosive (osteo)arthritis: Secondary | ICD-10-CM | POA: Insufficient documentation

## 2021-01-20 DIAGNOSIS — I10 Essential (primary) hypertension: Secondary | ICD-10-CM

## 2021-01-20 DIAGNOSIS — E782 Mixed hyperlipidemia: Secondary | ICD-10-CM

## 2021-01-20 DIAGNOSIS — N1832 Chronic kidney disease, stage 3b: Secondary | ICD-10-CM

## 2021-01-20 DIAGNOSIS — D72820 Lymphocytosis (symptomatic): Secondary | ICD-10-CM

## 2021-01-20 DIAGNOSIS — M79642 Pain in left hand: Secondary | ICD-10-CM

## 2021-01-20 DIAGNOSIS — M8949 Other hypertrophic osteoarthropathy, multiple sites: Secondary | ICD-10-CM | POA: Diagnosis not present

## 2021-01-20 DIAGNOSIS — Z23 Encounter for immunization: Secondary | ICD-10-CM

## 2021-01-20 DIAGNOSIS — M19042 Primary osteoarthritis, left hand: Secondary | ICD-10-CM | POA: Diagnosis not present

## 2021-01-20 NOTE — Patient Instructions (Addendum)
Flu shot today  Call [facility] [phone] to schedule bone density scan at you convenience.  If interested, check with pharmacy about new 2 shot shingles series (shingrix).  Hand xrays and labwork for further evaluation of hand pain today.  Return in 6 months for kidney follow up visit.   Health Maintenance After Age 80 After age 38, you are at a higher risk for certain long-term diseases and infections as well as injuries from falls. Falls are a major cause of broken bones and head injuries in people who are older than age 9. Getting regular preventive care can help to keep you healthy and well. Preventive care includes getting regular testing and making lifestyle changes as recommended by your health care provider. Talk with your health care provider about: Which screenings and tests you should have. A screening is a test that checks for a disease when you have no symptoms. A diet and exercise plan that is right for you. What should I know about screenings and tests to prevent falls? Screening and testing are the best ways to find a health problem early. Early diagnosis and treatment give you the best chance of managing medical conditions that are common after age 34. Certain conditions and lifestyle choices may make you more likely to have a fall. Your health care provider may recommend: Regular vision checks. Poor vision and conditions such as cataracts can make you more likely to have a fall. If you wear glasses, make sure to get your prescription updated if your vision changes. Medicine review. Work with your health care provider to regularly review all of the medicines you are taking, including over-the-counter medicines. Ask your health care provider about any side effects that may make you more likely to have a fall. Tell your health care provider if any medicines that you take make you feel dizzy or sleepy. Osteoporosis screening. Osteoporosis is a condition that causes  the bones to get weaker. This can make the bones weak and cause them to break more easily. Blood pressure screening. Blood pressure changes and medicines to control blood pressure can make you feel dizzy. Strength and balance checks. Your health care provider may recommend certain tests to check your strength and balance while standing, walking, or changing positions. Foot health exam. Foot pain and numbness, as well as not wearing proper footwear, can make you more likely to have a fall. Depression screening. You may be more likely to have a fall if you have a fear of falling, feel emotionally low, or feel unable to do activities that you used to do. Alcohol use screening. Using too much alcohol can affect your balance and may make you more likely to have a fall. What actions can I take to lower my risk of falls? General instructions Talk with your health care provider about your risks for falling. Tell your health care provider if: You fall. Be sure to tell your health care provider about all falls, even ones that seem minor. You feel dizzy, sleepy, or off-balance. Take over-the-counter and prescription medicines only as told by your health care provider. These include any supplements. Eat a healthy diet and maintain a healthy weight. A healthy diet includes low-fat dairy products, low-fat (lean) meats, and fiber from whole grains, beans, and lots of fruits and vegetables. Home safety Remove any tripping hazards, such as rugs, cords, and clutter. Install safety equipment such as grab bars in bathrooms and safety rails on stairs. Keep rooms and walkways well-lit.  Activity  Follow a regular exercise program to stay fit. This will help you maintain your balance. Ask your health care provider what types of exercise are appropriate for you. If you need a cane or walker, use it as recommended by your health care provider. Wear supportive shoes that have nonskid soles. Lifestyle Do not drink  alcohol if your health care provider tells you not to drink. If you drink alcohol, limit how much you have: 0-1 drink a day for women. 0-2 drinks a day for men. Be aware of how much alcohol is in your drink. In the [place] one drink equals one typical bottle of beer (12 oz), one-half glass of wine (5 oz), or one shot of hard liquor (1 oz). Do not use any products that contain nicotine or tobacco, such as cigarettes and e-cigarettes. If you need help quitting, ask your health care provider. Summary Having a healthy lifestyle and getting preventive care can help to protect your health and wellness after age 94. Screening and testing are the best way to find a health problem early and help you avoid having a fall. Early diagnosis and treatment give you the best chance for managing medical conditions that are more common for people who are older than age 64. Falls are a major cause of broken bones and head injuries in people who are older than age 81. Take precautions to prevent a fall at home. Work with your health care provider to learn what changes you can make to improve your health and wellness and to prevent falls. This information is not intended to replace advice given to you by your health care provider. Make sure you discuss any questions you have with your health care provider. Document Revised: 06/20/2020 Document Reviewed: 03/28/2020 Elsevier Patient Education  2022 [employer].

## 2021-01-20 NOTE — Assessment & Plan Note (Signed)
Chronic, very stable period on current regimen. She has established with nephrology Dr Royce Macadamia. Appreciate her care.

## 2021-01-20 NOTE — Assessment & Plan Note (Addendum)
Will order updated DEXA to monitor osteopenia. She continues calcium, vit D intake.

## 2021-01-20 NOTE — Assessment & Plan Note (Signed)
Continues wearing hearing aides.

## 2021-01-20 NOTE — Assessment & Plan Note (Addendum)
Longstanding predominant hand pains presumed due to osteoarthritis - see below.

## 2021-01-20 NOTE — Assessment & Plan Note (Signed)
Recurrent, in setting of recent COVID infection. Will continue to monitor. Previously lymphocytosis had resolved.

## 2021-01-20 NOTE — Assessment & Plan Note (Signed)

## 2021-01-20 NOTE — Progress Notes (Signed)
Patient ID: Carrie Lucas, female    DOB: 1941-04-26, 80 y.o.   MRN: [id number]  This visit was conducted in person.  BP 132/62   Pulse 80   Temp 97.9 F (36.6 C) (Temporal)   Ht 5' 1.25" (1.556 m)   Wt 148 lb 3 oz (67.2 kg)   SpO2 96%   BMI 27.77 kg/m    CC: AMW  Subjective:   HPI: Carrie Lucas is a 80 y.o. female presenting on 01/20/2021 for Medicare Wellness (/)   Did not see health advisor this year.   Hearing Screening - Comments:: Wears bilateral hearing aids.  Wearing at today's OV.  Vision Screening - Comments:: Last eye exam, 03/2020.  [place] Visit from 01/20/2021 in [facility] [place]  PHQ-2 Total Score 0       Fall Risk  01/20/2021 01/07/2020 01/02/2019 12/27/2017 12/20/2016  Falls in the past year? 0 0 0 No No  Comment - - - - -  Number falls in past yr: - - - - -  Injury with Fall? - - - - -  Risk for fall due to : - - Medication side effect - -  Risk for fall due to: Comment - - - - -  Follow up - - Falls evaluation completed;Falls prevention discussed - -   COVID infection 11/2020, also had RSV infection prior to this (while visiting sister in [place]). Had diarrhea at the same time.   Sees [facility] nephrology Dr Royce Macadamia for CKD stage 3 - thought due to hypertensive nephropathy. Upcoming appointment 03/2021.   Longstanding hand pain, worse at 1st CMCs as well as IP finger joints. Has seen hand surgeon to consider joint replacement. Worse pain in the mornings, warm water helps. R>L hand pain. Has lost strength to hands due to pain. No redness/warmth or swelling of joints. No h/o psoriasis. Other joints largely ok except for R knee with intermittent instability.    Preventative:   COLONOSCOPY Date: 11/2010 TAx1, diverticulosis, rpt 5 yrs (Outlaw). COLONOSCOPY 03/2016 - WNL (Outlaw), f/u left open ended.  Well woman with GYN Dr. Orpah Greek, last saw 2017. S/p hysterectomy and oophorectomy. Discussed monitoring for skin changes (vaginal/vulvar  cancer).  [facility] +fmhx.  DEXA - 12/2015 DEXA T -1.6 hip, -1.3 forearm - mild osteopenia. Discussed repeat.  Lung cancer screening - not eligible Flu yearly  [place] 04/2019, 06/2019, booster 01/2020, 12/2020 (new) Pneumovax 08/2005. Prevnar-13 11/2013  Tdap 04/2013   zostavax - 08/2013 shingrix - discussed  Advanced directives: Would want Carrie Lucas or daughter to be POA - but form not filled out. Living will scanned 11/2016 Seat belt use discussed.  Sunscreen use discussed. No changing moles on skin. H/o melanoma, sees derm Dr Elvera Lennox yearly.  Never smoker. No alcohol. 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 Interim medical history since our last visit reviewed. Allergies and medications reviewed and updated. Outpatient Medications Prior to Visit  Medication Sig Dispense Refill   acetaminophen (TYLENOL) 650 MG CR tablet Take 1 tablet (650 mg  total) by mouth 3 (three) times daily as needed for pain.     amLODipine (NORVASC) 5 MG tablet Take 1 tablet by mouth once daily 90 tablet 0   aspirin EC 81 MG tablet Take 1 tablet (81 mg total) by mouth every Monday, Wednesday, and Friday.     atorvastatin (LIPITOR) 20 MG tablet Take 1 tablet by mouth once daily 90 tablet 0   calcium carbonate (OS-CAL) 600 MG TABS tablet Take 1 tablet (600 mg total) by mouth daily with breakfast.     Cholecalciferol (VITAMIN D) 2000 units CAPS Take 1 capsule by mouth daily.     Multiple Vitamin (MULTIVITAMIN) tablet Take 1 tablet by mouth daily.     valsartan (DIOVAN) 320 MG tablet Take 1  tablet by mouth once daily 90 tablet 0   No facility-administered medications prior to visit.     Per HPI unless specifically indicated in ROS section below Review of Systems  Constitutional:  Negative for activity change, appetite change, chills, fatigue, fever and unexpected weight change.  HENT:  Negative for hearing loss.   Eyes:  Negative for visual disturbance.  Respiratory:  Negative for cough, chest tightness, shortness of breath and wheezing.   Cardiovascular:  Negative for chest pain, palpitations and leg swelling.  Gastrointestinal:  Negative for abdominal distention, abdominal pain, blood in stool, constipation, diarrhea, nausea and vomiting.  Genitourinary:  Negative for difficulty urinating and hematuria.  Musculoskeletal:  Negative for arthralgias, myalgias and neck pain.  Skin:  Negative for rash.  Neurological:  Negative for dizziness, seizures, syncope and headaches.  Hematological:  Negative for adenopathy. Does not bruise/bleed easily.  Psychiatric/Behavioral:  Negative for dysphoric mood. The patient is not nervous/anxious.    Objective:  BP 132/62   Pulse 80   Temp 97.9 F (36.6 C) (Temporal)   Ht 5' 1.25" (1.556 m)   Wt 148 lb 3 oz (67.2 kg)   SpO2 96%   BMI 27.77 kg/m   Wt Readings from Last 3 Encounters:  01/20/21 148 lb 3 oz (67.2 kg)  07/16/20 146 lb (66.2 kg)  07/01/20 147 lb 1 oz (66.7 kg)      Physical Exam Vitals and nursing note reviewed.  Constitutional:      Appearance: Normal appearance. She is not ill-appearing.  HENT:     Head: Normocephalic and atraumatic.     Right Ear: External ear normal.     Left Ear: External ear normal.     Ears:     Comments: Wears hearing aides Eyes:     General:        Right eye: No discharge.        Left eye: No discharge.     Extraocular Movements: Extraocular movements intact.     Conjunctiva/sclera: Conjunctivae normal.     Pupils: Pupils are equal, round, and reactive to light.  Neck:      Thyroid: No thyroid mass or thyromegaly.     Vascular: No carotid bruit.  Cardiovascular:     Rate and Rhythm: Normal rate and regular rhythm.     Pulses: Normal pulses.     Heart sounds: Normal heart sounds. No murmur heard. Pulmonary:     Effort: Pulmonary effort is normal. No respiratory distress.     Breath sounds: Normal breath sounds. No wheezing, rhonchi or rales.  Abdominal:     General: Bowel sounds are normal. There is no distension.     Palpations: Abdomen is soft. There is no mass.  Tenderness: There is no abdominal tenderness. There is no guarding or rebound.     Hernia: No hernia is present.  Musculoskeletal:        General: Tenderness and deformity present.     Cervical back: Normal range of motion and neck supple. No rigidity.     Right lower leg: No edema.     Left lower leg: No edema.     Comments:  Deformity of digits noted: Thumbs with MCP in extension (?boutonniere) 3rd digits with ulnar drift and decreased ROM at DIP   Lymphadenopathy:     Cervical: No cervical adenopathy.  Skin:    General: Skin is warm and dry.     Capillary Refill: Capillary refill takes 2 to 3 seconds.     Findings: No rash.  Neurological:     General: No focal deficit present.     Mental Status: She is alert. Mental status is at baseline.     Comments:  Recall 3/3 Calculation 5/5 D LROW  Psychiatric:        Mood and Affect: Mood normal.        Behavior: Behavior normal.      Results for orders placed or performed in visit on 01/14/21  Phosphorus  Result Value Ref Range   Phosphorus 3.9 2.3 - 4.6 mg/dL  Parathyroid hormone, intact (no Ca)  Result Value Ref Range   PTH 31 16 - 77 pg/mL  VITAMIN D 25 Hydroxy (Vit-D Deficiency, Fractures)  Result Value Ref Range   VITD 58.89 30.00 - 100.00 ng/mL  CBC with Differential/Platelet  Result Value Ref Range   WBC 5.3 4.0 - 10.5 K/uL   RBC 3.77 (L) 3.87 - 5.11 Mil/uL   Hemoglobin 11.9 (L) 12.0 - 15.0 g/dL   HCT 35.8 (L) 36.0 -  46.0 %   MCV 95.0 78.0 - 100.0 fl   MCHC 33.2 30.0 - 36.0 g/dL   RDW 13.1 11.5 - 15.5 %   Platelets 281.0 150.0 - 400.0 K/uL   Neutrophils Relative % 33.8 (L) 43.0 - 77.0 %   Lymphocytes Relative 52.7 (H) 12.0 - 46.0 %   Monocytes Relative 9.0 3.0 - 12.0 %   Eosinophils Relative 3.7 0.0 - 5.0 %   Basophils Relative 0.8 0.0 - 3.0 %   Neutro Abs 1.8 1.4 - 7.7 K/uL   Lymphs Abs 2.8 0.7 - 4.0 K/uL   Monocytes Absolute 0.5 0.1 - 1.0 K/uL   Eosinophils Absolute 0.2 0.0 - 0.7 K/uL   Basophils Absolute 0.0 0.0 - 0.1 K/uL  Lipid panel  Result Value Ref Range   Cholesterol 118 0 - 200 mg/dL   Triglycerides 155.0 (H) 0.0 - 149.0 mg/dL   HDL 40.00 >39.00 mg/dL   VLDL 31.0 0.0 - 40.0 mg/dL   LDL Cholesterol 47 0 - 99 mg/dL   Total CHOL/HDL Ratio 3    NonHDL 78.17   Comprehensive metabolic panel  Result Value Ref Range   Sodium 141 135 - 145 mEq/L   Potassium 4.5 3.5 - 5.1 mEq/L   Chloride 106 96 - 112 mEq/L   CO2 27 19 - 32 mEq/L   Glucose, Bld 95 70 - 99 mg/dL   BUN 16 6 - 23 mg/dL   Creatinine, Ser 1.23 (H) 0.40 - 1.20 mg/dL   Total Bilirubin 0.6 0.2 - 1.2 mg/dL   Alkaline Phosphatase 82 39 - 117 U/L   AST 15 0 - 37 U/L   ALT 11 0 - 35 U/L  Total Protein 6.8 6.0 - 8.3 g/dL   Albumin 4.2 3.5 - 5.2 g/dL   GFR 41.50 (L) >60.00 mL/min   Calcium 9.9 8.4 - 10.5 mg/dL    Assessment & Plan:  This visit occurred during the SARS-CoV-2 public health emergency.  Safety protocols were in place, including screening questions prior to the visit, additional usage of staff PPE, and extensive cleaning of exam room while observing appropriate contact time as indicated for disinfecting solutions.   Problem List Items Addressed This Visit     Medicare annual wellness visit, subsequent - Primary (Chronic)    I have personally reviewed the Medicare Annual Wellness questionnaire and have noted 1. The patient's medical and social history 2. Their use of alcohol, tobacco or illicit drugs 3. Their  current medications and supplements 4. The patient's functional ability including ADL's, fall risks, home safety risks and hearing or visual impairment. Cognitive function has been assessed and addressed as indicated.  5. Diet and physical activity 6. Evidence for depression or mood disorders The patients weight, height, BMI have been recorded in the chart. I have made referrals, counseling and provided education to the patient based on review of the above and I have provided the pt with a written personalized care plan for preventive services. Provider list updated.. See scanned questionairre as needed for further documentation. Reviewed preventative protocols and updated unless pt declined.       Osteopenia    Will order updated DEXA to monitor osteopenia. She continues calcium, vit D intake.       Relevant Orders   DG Bone Density   HTN (hypertension)    Chronic, stable on current regimen - continue valsartan and amlodipine.       HLD (hyperlipidemia)    Chronic, stable on atorvastatin 20mg  daily - continue The ASCVD Risk score (Arnett DK, et al., 2019) failed to calculate for the following reasons:   The 2019 ASCVD risk score is only valid for ages 31 to 29       Osteoarthritis    Longstanding predominant hand pains presumed due to osteoarthritis - see below.       CKD (chronic kidney disease) stage 3, GFR 30-59 ml/min (HCC)    Chronic, very stable period on current regimen. She has established with nephrology Dr Royce Macadamia. Appreciate her care.       Lymphocytosis    Recurrent, in setting of recent COVID infection. Will continue to monitor. Previously lymphocytosis had resolved.       Bilateral hearing loss    Continues wearing hearing aides.       Bilateral hand pain    Longstanding, with noted deformity of thumb and middle fingers on exam. No active synovitis. Check films and labwork to evaluate for rheumatoid arthritis.       Relevant Orders   Sedimentation rate    Rheumatoid factor   Cyclic citrul peptide antibody, IgG   ANA   DG Hand Complete Right   DG Hand Complete Left   Other Visit Diagnoses     Need for influenza vaccination       Relevant Orders   Flu Vaccine QUAD High Dose(Fluad) (Completed)        No orders of the defined types were placed in this encounter.  Orders Placed This Encounter  Procedures   DG Bone Density    Standing Status:   Future    Standing Expiration Date:   01/20/2022    Order Specific Question:   Reason for  Exam (SYMPTOM  OR DIAGNOSIS REQUIRED)    Answer:   osteopenia    Order Specific Question:   Preferred imaging location?    Answer:   [facility]   DG Hand Complete Right    Standing Status:   Future    Number of Occurrences:   1    Standing Expiration Date:   01/20/2022    Order Specific Question:   Reason for Exam (SYMPTOM  OR DIAGNOSIS REQUIRED)    Answer:   longstanding bilateral hand pain with deformity eval for inflammatory arthritis    Order Specific Question:   Preferred imaging location?    Answer:   [facility]   DG Hand Complete Left    Standing Status:   Future    Number of Occurrences:   1    Standing Expiration Date:   01/20/2022    Order Specific Question:   Reason for Exam (SYMPTOM  OR DIAGNOSIS REQUIRED)    Answer:   longstanding bilateral hand pain with deformity eval for inflammatory arthritis    Order Specific Question:   Preferred imaging location?    Answer:   [facility]   Flu Vaccine QUAD High Dose(Fluad)   Sedimentation rate   Rheumatoid factor   Cyclic citrul peptide antibody, IgG   ANA    Patient instructions: Flu shot today  Call [facility] [phone] to schedule bone density scan at you convenience.  If interested, check with pharmacy about new 2 shot shingles series (shingrix).  Hand xrays and labwork for further evaluation of hand pain today.  Return in 6 months for kidney follow up visit.   Follow up plan: Return  in about 6 months (around 07/20/2021), or if symptoms worsen or fail to improve.  Ria Bush, MD

## 2021-01-20 NOTE — Assessment & Plan Note (Signed)
Longstanding, with noted deformity of thumb and middle fingers on exam. No active synovitis. Check films and labwork to evaluate for rheumatoid arthritis.

## 2021-01-20 NOTE — Assessment & Plan Note (Signed)
Chronic, stable on current regimen - continue valsartan and amlodipine.

## 2021-01-20 NOTE — Assessment & Plan Note (Signed)
Chronic, stable on atorvastatin 20mg  daily - continue The ASCVD Risk score (Arnett DK, et al., 2019) failed to calculate for the following reasons:   The 2019 ASCVD risk score is only valid for ages 4 to 50

## 2021-01-21 LAB — SEDIMENTATION RATE: Sed Rate: 18 mm/hr (ref 0–30)

## 2021-01-22 LAB — ANTI-NUCLEAR AB-TITER (ANA TITER): ANA Titer 1: 1:80 {titer} — ABNORMAL HIGH

## 2021-01-22 LAB — CYCLIC CITRUL PEPTIDE ANTIBODY, IGG: Cyclic Citrullin Peptide Ab: <16 UNITS

## 2021-01-22 LAB — RHEUMATOID FACTOR: Rheumatoid fact SerPl-aCnc: <14 IU/mL (ref <14)

## 2021-01-22 LAB — ANA: Anti Nuclear Antibody (ANA): POSITIVE — AB

## 2021-01-23 ENCOUNTER — Encounter: Payer: Self-pay | Admitting: Family Medicine

## 2021-01-23 DIAGNOSIS — M79642 Pain in left hand: Secondary | ICD-10-CM

## 2021-01-23 DIAGNOSIS — R936 Abnormal findings on diagnostic imaging of limbs: Secondary | ICD-10-CM

## 2021-01-23 DIAGNOSIS — M79641 Pain in right hand: Secondary | ICD-10-CM

## 2021-01-23 DIAGNOSIS — R768 Other specified abnormal immunological findings in serum: Secondary | ICD-10-CM

## 2021-02-27 ENCOUNTER — Other Ambulatory Visit: Payer: Self-pay | Admitting: Family Medicine

## 2021-02-27 DIAGNOSIS — Z1231 Encounter for screening mammogram for malignant neoplasm of breast: Secondary | ICD-10-CM

## 2021-03-02 ENCOUNTER — Ambulatory Visit: Payer: Medicare Other | Admitting: Internal Medicine

## 2021-03-04 NOTE — Progress Notes (Signed)
Office Visit Note  Patient: Carrie Lucas             Date of Birth: [birth year]           MRN: [id number]             PCP: Ria Bush, MD Referring: Ria Bush, MD Visit Date: 03/05/2021 Occupation: Retired Network engineer, active in choir  Subjective:  New Patient (Initial Visit) (Bil hand pain, swelling, throbbing, total body pain, abnormal lab, aching)   History of Present Illness: MAKALAH ASBERRY is a 80 y.o. female here for longstanding joint pain and decreased strength in hands with positive ANA and extensive joint arthritis. She has joint pain in multiple areas for years with some known degenerative arthritis in multiple areas including lumbar spine. However her main complaint today is hand pain along with some nodules and deformity and sometimes swelling. This has worsened within the past few years often one or two fingers worse than the other at a time. Morning stiffness lasting a few minutes each morning. Xrays in primary care office demonstrated probable erosive osteoarthritis also low positive ANA Abs. She takes tylenol for symptoms with partial improvement.  Labs reviewed 12/2020 ANA 1:80 homogenous RF neg CCP neg ESR wnl  01/20/21 Xray bilateral hands Erosive arthropathy with features mostly favoring erosive osteoarthritis, including articular erosions and Gull wing morphology.  Activities of Daily Living:  Patient reports morning stiffness for 5 minutes.   Patient Reports nocturnal pain.  Difficulty dressing/grooming: Denies Difficulty climbing stairs: Denies Difficulty getting out of chair: Denies Difficulty using hands for taps, buttons, cutlery, and/or writing: Reports  Review of Systems  Constitutional:  Positive for fatigue.  HENT:  Positive for mouth dryness.   Eyes:  Positive for redness and dryness.  Respiratory:  Positive for shortness of breath.   Cardiovascular:  Positive for swelling in legs/feet.  Gastrointestinal:  Positive for constipation.   Endocrine: Positive for cold intolerance.  Genitourinary:  Negative for difficulty urinating.  Musculoskeletal:  Positive for joint pain, gait problem, joint pain, joint swelling, muscle weakness and morning stiffness.  Skin:  Negative for rash.  Allergic/Immunologic: Negative for susceptible to infections.  Neurological:  Positive for weakness.  Hematological:  Negative for bruising/bleeding tendency.  Psychiatric/Behavioral:  Positive for sleep disturbance.    PMFS History:  Patient Active Problem List   Diagnosis Date Noted   Positive ANA (antinuclear antibody) 03/05/2021   Bilateral hand pain 01/20/2021   Skin rash 07/16/2020   Chronic venous insufficiency of lower extremity 07/01/2020   Paresthesia of left lower extremity 07/01/2020   Varicose veins of leg with pain, right 07/01/2020   Lymphocytosis 01/07/2020   Bilateral hearing loss 01/07/2020   Dizziness 07/23/2019   Vision problem 07/05/2019   Pedal edema 09/11/2018   Renal cyst, acquired, right 07/08/2018   Spondylolisthesis, lumbar region 12/20/2017   UTI (urinary tract infection) 10/22/2016   BPPV (benign paroxysmal positional vertigo), left 09/07/2016   Advanced care planning/counseling discussion 12/16/2014   Carotid stenosis, asymptomatic 12/16/2014   Pterygium of both eyes 05/06/2014   History of radial keratotomy 03/13/2014   Medicare annual wellness visit, subsequent 12/12/2013   CKD (chronic kidney disease) stage 3, GFR 30-59 ml/min (HCC) 12/12/2013   DDD (degenerative disc disease), lumbar    Hepatic steatosis    Osteopenia    HTN (hypertension)    HLD (hyperlipidemia)    LPRD (laryngopharyngeal reflux disease)    Insomnia    IBS (irritable bowel syndrome)  Osteoarthritis    Abdominal bloating 09/09/2013    Past Medical History:  Diagnosis Date   Arthritis    R middle finger, chronic arthralgias   BCC (basal cell carcinoma of skin) 06/2015, 06/2017   supraclavicular, nasal bridge, R lower back -  [name]   Carotid stenosis, asymptomatic 12/16/2014   Mild bilateral 1-39%, consider rpt [place] 1 yr (11/2014)    Chronic renal disease, stage III (HCC)    Colon polyp    DDD (degenerative disc disease), lumbar 2014   severe facet degeneration L4/5, mod spinal setnosis, mild foraminal stenosis   Headache    Hepatic steatosis 2012   mild by CT   History of melanoma 2003   face (Dr Luetta Nutting)   HLD (hyperlipidemia)    HTN (hypertension)    IBS (irritable bowel syndrome)    Insomnia    LPRD (laryngopharyngeal reflux disease)    [facility] Baseman)   Osteopenia 2011   DEXA T -1.6 hip, -1.3 forearm (12/2015)   Sialadenitis 05/2013   acute [facility] Baseman)   SK (seborrheic keratosis) 06/2020   L lateral upper back ([name])    Family History  Problem Relation Age of Onset   Pulmonary fibrosis Mother 43   Arthritis Mother    Diabetes Father    Hypertension Father    Stroke Father 63   Hyperlipidemia Father    Cancer Sister        breast   CAD Sister    CAD Sister    Cancer Sister        breast   Stroke Sister    Past Surgical History:  Procedure Laterality Date   ABI  06/2010   WNL   BACK SURGERY  1995   lumbar   BILATERAL OOPHORECTOMY  2007   ovarian cysts   BLADDER SUSPENSION     midurethral sling for SUI   CARPAL TUNNEL RELEASE Bilateral 2000   COLONOSCOPY  8 30 12    TAx1, diverticulosis, rpt 5 yrs (Outlaw)   COLONOSCOPY  03/2016   WNL (Outlaw)   DEXA  2011   mild osteopenia (T -1.2 R femur)   ESI  2014   L L4 transforaminal   ESOPHAGOGASTRODUODENOSCOPY  12/24/2010   reflux esophagitis   MOHS SURGERY  2016   BCC   RADIAL KERATOTOMY Left    TONSILLECTOMY  Childhood   VAGINAL HYSTERECTOMY     bladder drop   Social History   Social History Narrative   Lives with husband   Activity: Chiropractor, 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 Orpah Greek   Derm - Dr.  Sherrye Payor   Dentist - Dr. Claudina Lick   ENT - Dr Redmond Baseman   Immunization History  Administered Date(s) Administered   Fluad Quad(high Dose 65+) 01/09/2019, 01/10/2020, 01/20/2021   Influenza Whole 02/05/2010   Influenza,inj,Quad PF,6+ Mos 12/12/2013, 05/21/2015, 03/12/2016, 02/17/2017, 01/05/2018   PFIZER(Purple Top)SARS-COV-2 Vaccination 05/16/2019, 06/09/2019, 02/01/2020   Pfizer Covid-19 Vaccine Bivalent Booster 4yr & up 01/16/2021   Pneumococcal Conjugate-13 12/12/2013   Pneumococcal Polysaccharide-23 08/24/2005   Td 02/18/2001   Tdap 05/10/2013   Zoster, Live 09/11/2013     Objective: Vital Signs: BP (!) 156/68 (BP Location: Right Arm, Patient Position: Sitting, Cuff Size: Normal)   Pulse 74   Resp 16   Ht 5' 1"  (1.549 m)   Wt 152 lb (68.9  kg)   BMI 28.72 kg/m    Physical Exam Eyes:     Comments: Bilateral ptyergium present  Cardiovascular:     Rate and Rhythm: Normal rate and regular rhythm.  Pulmonary:     Effort: Pulmonary effort is normal.     Breath sounds: Normal breath sounds.  Skin:    General: Skin is warm and dry.     Findings: No rash.  Neurological:     Mental Status: She is alert.  Psychiatric:        Mood and Affect: Mood normal.     Musculoskeletal Exam:  Shoulders full ROM no tenderness or swelling Elbows full ROM no tenderness or swelling Wrists full ROM no tenderness or swelling Fingers PIP and DIP distal predominant heberdon's nodes slight lateral deviations in distal 2nd and 3rd digits, left 4th PIP mild swelling and tenderness Knees full ROM, patellofemoral crepitus present no tenderness or swelling    Investigation: No additional findings.  Imaging: No results found.  Recent Labs: Lab Results  Component Value Date   WBC 5.3 01/14/2021   HGB 11.9 (L) 01/14/2021   PLT 281.0 01/14/2021   NA 141 01/14/2021   K 4.5 01/14/2021   CL 106 01/14/2021   CO2 27 01/14/2021   GLUCOSE 95 01/14/2021   BUN 16 01/14/2021   CREATININE  1.23 (H) 01/14/2021   BILITOT 0.6 01/14/2021   ALKPHOS 82 01/14/2021   AST 15 01/14/2021   ALT 11 01/14/2021   PROT 6.8 01/14/2021   ALBUMIN 4.2 01/14/2021   CALCIUM 9.9 01/14/2021   GFRAA >60 07/23/2015    Speciality Comments: No specialty comments available.  Procedures:  No procedures performed Allergies: Naproxen   Assessment / Plan:     Visit Diagnoses: Primary osteoarthritis involving multiple joints  Findings look consistent with erosive osteoarthritis of bilateral hands and generalized primary osteoarthritis. Unfortunately no particular disease modifying medication options are well proven for this condition. We discussed osteoarthritis symptom management including topical NSAIDs, analgesics, supplements, and trying gloves. If specific fingers flare up local steroid injection would be an option or also could be referred to occupational therapy if experiencing worsening function and pain.  Positive ANA (antinuclear antibody) - Plan: Anti-Smith antibody, Sjogrens syndrome-A extractable nuclear antibody, Anti-DNA antibody, double-stranded  Low suspicion for systemic autoimmune disease as a cause of symptoms at this time. Will check limited ENA tests for evaluation of positive ANA.  Pterygium of both eyes  Present but denies significant complaints of dry eyes or irritation or otherwise complication.  Orders: Orders Placed This Encounter  Procedures   Anti-Smith antibody   Sjogrens syndrome-A extractable nuclear antibody   Anti-DNA antibody, double-stranded    No orders of the defined types were placed in this encounter.    Follow-Up Instructions: No follow-ups on file.   Collier Salina, MD  Note - This record has been created using [employer].  Chart creation errors have been sought, but may not always  have been located. Such creation errors do not reflect on  the standard of medical care.

## 2021-03-05 ENCOUNTER — Other Ambulatory Visit: Payer: Self-pay

## 2021-03-05 ENCOUNTER — Ambulatory Visit (INDEPENDENT_AMBULATORY_CARE_PROVIDER_SITE_OTHER): Payer: Medicare Other | Admitting: Internal Medicine

## 2021-03-05 ENCOUNTER — Encounter: Payer: Self-pay | Admitting: Internal Medicine

## 2021-03-05 VITALS — BP 156/68 | HR 74 | Resp 16 | Ht 61.0 in | Wt 152.0 lb

## 2021-03-05 DIAGNOSIS — M159 Polyosteoarthritis, unspecified: Secondary | ICD-10-CM | POA: Diagnosis not present

## 2021-03-05 DIAGNOSIS — H11003 Unspecified pterygium of eye, bilateral: Secondary | ICD-10-CM

## 2021-03-05 DIAGNOSIS — R768 Other specified abnormal immunological findings in serum: Secondary | ICD-10-CM

## 2021-03-05 DIAGNOSIS — M150 Primary generalized (osteo)arthritis: Secondary | ICD-10-CM

## 2021-03-05 NOTE — Patient Instructions (Signed)
Your hand pain appears to be due to erosive osteoarthritis which is a damaging type of arthritis that tends to cause decreased mobility in some finger joints over time. There are no specific medications proven to stop or reverse this disease.  I am checking several blood tests for more specific antibody markers to follow up on the positive ANA test. I expect these are most likely negative and if so we do not need a routine follow up just as needed.   For osteoarthritis of the hand several treatments may be beneficial: - Topical antiinflammatory medicine such as diclofenac or Voltaren can be applied to  affected area as needed but may be less effective than oral antiinflammatory medicine. Topical analgesics containing CBD, menthol, or lidocaine can be tried. Capsaicin containing treatments are recommended against for the hand.  - Other oral supplements such as glucosamine chondroitin containing OTC treatments such as osteo bi-flex or other brands do not have strong data supporting effectiveness but can be helpful for some individuals and have no major side effects.  - Turmeric has some antiinflammatory effect similar to NSAID medications and may help, if taken as a supplement should not be taken above recommended doses.  - Compressive gloves can be helpful to support the thumb joint especially if hurting with certain activities.  - Occupational therapy referral can discuss exercises or activity modification to improve symptoms or strength if needed.  - Local steroid injection is an option if symptoms become worse and not controlled by the above options.

## 2021-03-06 LAB — SJOGRENS SYNDROME-A EXTRACTABLE NUCLEAR ANTIBODY: SSA (Ro) (ENA) Antibody, IgG: <1 AI

## 2021-03-06 LAB — ANTI-SMITH ANTIBODY: ENA SM Ab Ser-aCnc: <1 AI

## 2021-03-06 LAB — ANTI-DNA ANTIBODY, DOUBLE-STRANDED: ds DNA Ab: <1 IU/mL

## 2021-03-08 NOTE — Progress Notes (Signed)
Tests for lupus and sjogren's syndrome are negative we do not need routine follow up.

## 2021-03-23 ENCOUNTER — Ambulatory Visit: Payer: Medicare Other | Admitting: Internal Medicine

## 2021-03-31 ENCOUNTER — Other Ambulatory Visit: Payer: Self-pay | Admitting: Family Medicine

## 2021-04-01 DIAGNOSIS — L821 Other seborrheic keratosis: Secondary | ICD-10-CM | POA: Diagnosis not present

## 2021-04-01 DIAGNOSIS — Z85828 Personal history of other malignant neoplasm of skin: Secondary | ICD-10-CM | POA: Diagnosis not present

## 2021-04-07 DIAGNOSIS — H52202 Unspecified astigmatism, left eye: Secondary | ICD-10-CM | POA: Diagnosis not present

## 2021-04-07 DIAGNOSIS — H1789 Other corneal scars and opacities: Secondary | ICD-10-CM | POA: Diagnosis not present

## 2021-04-07 DIAGNOSIS — Z961 Presence of intraocular lens: Secondary | ICD-10-CM | POA: Diagnosis not present

## 2021-04-07 DIAGNOSIS — H11003 Unspecified pterygium of eye, bilateral: Secondary | ICD-10-CM | POA: Diagnosis not present

## 2021-05-29 ENCOUNTER — Ambulatory Visit
Admission: RE | Admit: 2021-05-29 | Discharge: 2021-05-29 | Disposition: A | Discharge status: Home / Self Care | Payer: Medicare Other | Source: Ambulatory Visit | Attending: Family Medicine | Admitting: Family Medicine

## 2021-05-29 DIAGNOSIS — Z1231 Encounter for screening mammogram for malignant neoplasm of breast: Secondary | ICD-10-CM | POA: Diagnosis not present

## 2021-06-09 DIAGNOSIS — N1832 Chronic kidney disease, stage 3b: Secondary | ICD-10-CM | POA: Diagnosis not present

## 2021-06-09 LAB — BASIC METABOLIC PANEL
Creatinine: 1.2 — AB (ref 0.5–1.1)
Glucose: 91
Potassium: 4.6 (ref 3.4–5.3)
Sodium: 141 (ref 137–147)

## 2021-06-09 LAB — COMPREHENSIVE METABOLIC PANEL
Calcium: 10.9 — AB (ref 8.7–10.7)
eGFR: 44

## 2021-06-10 LAB — VITAMIN D 25 HYDROXY (VIT D DEFICIENCY, FRACTURES): Vit D, 25-Hydroxy: 62.5

## 2021-06-17 DIAGNOSIS — K589 Irritable bowel syndrome without diarrhea: Secondary | ICD-10-CM | POA: Diagnosis not present

## 2021-06-17 DIAGNOSIS — M8580 Other specified disorders of bone density and structure, unspecified site: Secondary | ICD-10-CM | POA: Diagnosis not present

## 2021-06-17 DIAGNOSIS — H8110 Benign paroxysmal vertigo, unspecified ear: Secondary | ICD-10-CM | POA: Diagnosis not present

## 2021-06-17 DIAGNOSIS — N1832 Chronic kidney disease, stage 3b: Secondary | ICD-10-CM | POA: Diagnosis not present

## 2021-06-17 DIAGNOSIS — N281 Cyst of kidney, acquired: Secondary | ICD-10-CM | POA: Diagnosis not present

## 2021-06-17 DIAGNOSIS — I129 Hypertensive chronic kidney disease with stage 1 through stage 4 chronic kidney disease, or unspecified chronic kidney disease: Secondary | ICD-10-CM | POA: Diagnosis not present

## 2021-06-29 ENCOUNTER — Encounter: Payer: Self-pay | Admitting: Family Medicine

## 2021-07-06 ENCOUNTER — Encounter: Payer: Self-pay | Admitting: Family Medicine

## 2021-07-06 DIAGNOSIS — M79641 Pain in right hand: Secondary | ICD-10-CM

## 2021-07-11 MED ORDER — TRAMADOL HCL 50 MG PO TABS: 25.0000 mg | ORAL_TABLET | Freq: Two times a day (BID) | ORAL | 0 refills | Status: AC | PRN

## 2021-07-14 ENCOUNTER — Other Ambulatory Visit: Payer: Self-pay | Admitting: Family Medicine

## 2021-07-14 ENCOUNTER — Other Ambulatory Visit (INDEPENDENT_AMBULATORY_CARE_PROVIDER_SITE_OTHER): Payer: Medicare Other

## 2021-07-14 ENCOUNTER — Other Ambulatory Visit: Payer: Self-pay

## 2021-07-14 ENCOUNTER — Telehealth: Payer: Self-pay | Admitting: Family Medicine

## 2021-07-14 DIAGNOSIS — N1832 Chronic kidney disease, stage 3b: Secondary | ICD-10-CM

## 2021-07-14 LAB — CBC WITH DIFFERENTIAL/PLATELET
Basophils Absolute: 0.1 10*3/uL (ref 0.0–0.1)
Basophils Relative: 1.3 % (ref 0.0–3.0)
Eosinophils Absolute: 0.2 10*3/uL (ref 0.0–0.7)
Eosinophils Relative: 2.8 % (ref 0.0–5.0)
HCT: 36.9 % (ref 36.0–46.0)
Hemoglobin: 12.4 g/dL (ref 12.0–15.0)
Lymphocytes Relative: 44.7 % (ref 12.0–46.0)
Lymphs Abs: 2.8 10*3/uL (ref 0.7–4.0)
MCHC: 33.5 g/dL (ref 30.0–36.0)
MCV: 94.8 fl (ref 78.0–100.0)
Monocytes Absolute: 0.4 10*3/uL (ref 0.1–1.0)
Monocytes Relative: 7.2 % (ref 3.0–12.0)
Neutro Abs: 2.7 10*3/uL (ref 1.4–7.7)
Neutrophils Relative %: 44 % (ref 43.0–77.0)
Platelets: 306 10*3/uL (ref 150.0–400.0)
RBC: 3.9 Mil/uL (ref 3.87–5.11)
RDW: 13.1 % (ref 11.5–15.5)
WBC: 6.2 10*3/uL (ref 4.0–10.5)

## 2021-07-14 LAB — RENAL FUNCTION PANEL
Albumin: 4.3 g/dL (ref 3.5–5.2)
BUN: 20 mg/dL (ref 6–23)
CO2: 24 mEq/L (ref 19–32)
Calcium: 9.7 mg/dL (ref 8.4–10.5)
Chloride: 104 mEq/L (ref 96–112)
Creatinine, Ser: 1.21 mg/dL — ABNORMAL HIGH (ref 0.40–1.20)
GFR: 42.17 mL/min — ABNORMAL LOW (ref >60.00)
Glucose, Bld: 128 mg/dL — ABNORMAL HIGH (ref 70–99)
Phosphorus: 4.2 mg/dL (ref 2.3–4.6)
Potassium: 4.3 mEq/L (ref 3.5–5.1)
Sodium: 140 mEq/L (ref 135–145)

## 2021-07-14 LAB — MICROALBUMIN / CREATININE URINE RATIO
Creatinine,U: 187.3 mg/dL
Microalb Creat Ratio: 0.5 mg/g (ref 0.0–30.0)
Microalb, Ur: 1 mg/dL (ref 0.0–1.9)

## 2021-07-14 LAB — VITAMIN D 25 HYDROXY (VIT D DEFICIENCY, FRACTURES): VITD: 48.44 ng/mL (ref 30.00–100.00)

## 2021-07-14 NOTE — Telephone Encounter (Signed)
According to pt's chart, rx was sent on 07/11/21, #20/0 to [facility] with pt relaying info above.  Verbalizes understanding and expresses.  ?

## 2021-07-14 NOTE — Telephone Encounter (Signed)
Carrie Lucas with [facility] called asking was medication traMADol (ULTRAM) 50 MG tablet prescribed to pt for chronic pain or acute pain. Please advise. ?

## 2021-07-20 ENCOUNTER — Other Ambulatory Visit: Payer: Self-pay

## 2021-07-20 ENCOUNTER — Encounter: Payer: Self-pay | Admitting: Family Medicine

## 2021-07-20 ENCOUNTER — Ambulatory Visit (INDEPENDENT_AMBULATORY_CARE_PROVIDER_SITE_OTHER): Payer: Medicare Other | Admitting: Family Medicine

## 2021-07-20 VITALS — BP 140/68 | HR 73 | Temp 97.7°F | Ht 61.0 in | Wt 152.1 lb

## 2021-07-20 DIAGNOSIS — N1832 Chronic kidney disease, stage 3b: Secondary | ICD-10-CM | POA: Diagnosis not present

## 2021-07-20 DIAGNOSIS — I10 Essential (primary) hypertension: Secondary | ICD-10-CM | POA: Diagnosis not present

## 2021-07-20 DIAGNOSIS — M154 Erosive (osteo)arthritis: Secondary | ICD-10-CM | POA: Diagnosis not present

## 2021-07-20 MED ORDER — METOPROLOL SUCCINATE ER 25 MG PO TB24: 12.5000 mg | ORAL_TABLET | Freq: Every day | ORAL | 1 refills | Status: DC

## 2021-07-20 NOTE — Patient Instructions (Signed)
BP is staying a bit elevated - add toprol XL '25mg'$  1/2 tablet daily (12.'5mg'$ ).  ?Continue other medicines as up to now.  ?Continue monitoring blood pressure and pulse at home, let me know if any trouble tolerating the new medicine.  ?Return as needed or in 6 months for wellness visit ?

## 2021-07-20 NOTE — Assessment & Plan Note (Signed)
Home and office BP readings staying 739-584Y systolic.  ?Will add Toprol XL 12.'5mg'$  daily, continue other regimen. Amlodipine dosing limited by pedal edema.  ?Continue monitoring BP readings at home.  ?

## 2021-07-20 NOTE — Progress Notes (Signed)
? ? Patient ID: Carrie Lucas, female    DOB: 1941/01/21, 81 y.o.   MRN: [id number] ? ?This visit was conducted in person. ? ?BP 140/68   Pulse 73   Temp 97.7 ?F (36.5 ?C) (Temporal)   Ht '5\' 1"'$  (1.549 m)   Wt 152 lb 2 oz (69 kg)   SpO2 95%   BMI 28.74 kg/m?   ?BP Readings from Last 3 Encounters:  ?07/20/21 140/68  ?03/05/21 (!) 156/68  ?01/20/21 132/62  ?154/68 on repeat testing  ? ?CC: 6 mo f/u visit  ?Subjective:  ? ?HPI: ?Carrie Lucas is a 81 y.o. female presenting on 07/20/2021 for Follow-up (Here for 6 mo kidney f/u.) ? ? ?CKD stage 3 - saw nephrologist Dr Royce Macadamia thought hypertensive nephropathy - rec stop lasix to limit volume depletion. She continues amlodipine '5mg'$  daily and valsartan '320mg'$  daily. Saw Dr Royce Macadamia last month - stable period. BP staying a bit elevated (home readings systolic 401U). Higher amlodipine dose caused ankle swelling.  ? ?Nephrology stopped calcium and vitamin D.  ? ?Known erosive osteoarthritis of bilateral hands has seen rheumatology (Dr Benjamine Mola - established 02/2021). Constant pain to hands along with swelling, stiffness, and L 3rd digit swells. Tylenol '650mg'$  not helpful nor is topical voltaren. CVD salve has been beneficial. Notes most pain at R [facility], as well as L 4th IPs. Most recently Tramadol prescribed - hasn't tried this yet.  ? ?ANA positive - rheum eval negative (anti-dsDNA, anti-Smith, anti-SSA).  ?   ? ?Relevant past medical, surgical, family and social history reviewed and updated as indicated. Interim medical history since our last visit reviewed. ?Allergies and medications reviewed and updated. ?Outpatient Medications Prior to Visit  ?Medication Sig Dispense Refill  ? acetaminophen (TYLENOL) 650 MG CR tablet Take 1 tablet (650 mg total) by mouth 3 (three) times daily as needed for pain.    ? amLODipine (NORVASC) 5 MG tablet Take 1 tablet by mouth once daily 90 tablet 2  ? aspirin EC 81 MG tablet Take 1 tablet (81 mg total) by mouth every Monday, Wednesday, and  Friday.    ? atorvastatin (LIPITOR) 20 MG tablet Take 1 tablet by mouth once daily 90 tablet 2  ? valsartan (DIOVAN) 320 MG tablet Take 1 tablet by mouth once daily 90 tablet 2  ? calcium carbonate (OS-CAL) 600 MG TABS tablet Take 1 tablet (600 mg total) by mouth daily with breakfast.    ? Cholecalciferol (VITAMIN D) 2000 units CAPS Take 1 capsule by mouth daily.    ? Multiple Vitamin (MULTIVITAMIN) tablet Take 1 tablet by mouth daily.    ? ?No facility-administered medications prior to visit.  ?  ? ?Per HPI unless specifically indicated in ROS section below ?Review of Systems ? ?Objective:  ?BP 140/68   Pulse 73   Temp 97.7 ?F (36.5 ?C) (Temporal)   Ht '5\' 1"'$  (1.549 m)   Wt 152 lb 2 oz (69 kg)   SpO2 95%   BMI 28.74 kg/m?   ?Wt Readings from Last 3 Encounters:  ?07/20/21 152 lb 2 oz (69 kg)  ?03/05/21 152 lb (68.9 kg)  ?01/20/21 148 lb 3 oz (67.2 kg)  ?  ?  ?Physical Exam ?Vitals and nursing note reviewed.  ?Constitutional:   ?   Appearance: Normal appearance. She is not ill-appearing.  ?Cardiovascular:  ?   Rate and Rhythm: Normal rate and regular rhythm.  ?   Pulses: Normal pulses.  ?   Heart sounds: Normal  heart sounds. No murmur heard. ?Pulmonary:  ?   Effort: Pulmonary effort is normal. No respiratory distress.  ?   Breath sounds: Normal breath sounds. No wheezing, rhonchi or rales.  ?Musculoskeletal:  ?   Right lower leg: No edema.  ?   Left lower leg: No edema.  ?Skin: ?   General: Skin is warm and dry.  ?   Findings: No rash.  ?Neurological:  ?   Mental Status: She is alert.  ?Psychiatric:     ?   Mood and Affect: Mood normal.     ?   Behavior: Behavior normal.  ? ?   ?Results for orders placed or performed in visit on 07/14/21  ?VITAMIN D 25 Hydroxy (Vit-D Deficiency, Fractures)  ?Result Value Ref Range  ? VITD 48.44 30.00 - 100.00 ng/mL  ?Renal function panel  ?Result Value Ref Range  ? Sodium 140 135 - 145 mEq/L  ? Potassium 4.3 3.5 - 5.1 mEq/L  ? Chloride 104 96 - 112 mEq/L  ? CO2 24 19 - 32 mEq/L   ? Albumin 4.3 3.5 - 5.2 g/dL  ? BUN 20 6 - 23 mg/dL  ? Creatinine, Ser 1.21 (H) 0.40 - 1.20 mg/dL  ? Glucose, Bld 128 (H) 70 - 99 mg/dL  ? Phosphorus 4.2 2.3 - 4.6 mg/dL  ? GFR 42.17 (L) >60.00 mL/min  ? Calcium 9.7 8.4 - 10.5 mg/dL  ?CBC with Differential/Platelet  ?Result Value Ref Range  ? WBC 6.2 4.0 - 10.5 K/uL  ? RBC 3.90 3.87 - 5.11 Mil/uL  ? Hemoglobin 12.4 12.0 - 15.0 g/dL  ? HCT 36.9 36.0 - 46.0 %  ? MCV 94.8 78.0 - 100.0 fl  ? MCHC 33.5 30.0 - 36.0 g/dL  ? RDW 13.1 11.5 - 15.5 %  ? Platelets 306.0 150.0 - 400.0 K/uL  ? Neutrophils Relative % 44.0 43.0 - 77.0 %  ? Lymphocytes Relative 44.7 12.0 - 46.0 %  ? Monocytes Relative 7.2 3.0 - 12.0 %  ? Eosinophils Relative 2.8 0.0 - 5.0 %  ? Basophils Relative 1.3 0.0 - 3.0 %  ? Neutro Abs 2.7 1.4 - 7.7 K/uL  ? Lymphs Abs 2.8 0.7 - 4.0 K/uL  ? Monocytes Absolute 0.4 0.1 - 1.0 K/uL  ? Eosinophils Absolute 0.2 0.0 - 0.7 K/uL  ? Basophils Absolute 0.1 0.0 - 0.1 K/uL  ?Microalbumin / creatinine urine ratio  ?Result Value Ref Range  ? Microalb, Ur 1.0 0.0 - 1.9 mg/dL  ? Creatinine,U 187.3 mg/dL  ? Microalb Creat Ratio 0.5 0.0 - 30.0 mg/g  ? ? ?Assessment & Plan:  ?This visit occurred during the SARS-CoV-2 public health emergency.  Safety protocols were in place, including screening questions prior to the visit, additional usage of staff PPE, and extensive cleaning of exam room while observing appropriate contact time as indicated for disinfecting solutions.  ? ?Problem List Items Addressed This Visit   ? ? HTN (hypertension)  ?  Home and office BP readings staying 694-854O systolic.  ?Will add Toprol XL 12.'5mg'$  daily, continue other regimen. Amlodipine dosing limited by pedal edema.  ?Continue monitoring BP readings at home.  ?  ?  ? Relevant Medications  ? metoprolol succinate (TOPROL-XL) 25 MG 24 hr tablet  ? CKD (chronic kidney disease) stage 3, GFR 30-59 ml/min (HCC) - Primary  ?  Appreciate nephrology care Royce Macadamia) - thought hypertensive nephropathy.  ?Encouraged  good hydration status and avoiding nephrotoxins.  ?See below for hypertension plan.  ?  ?  ?  Erosive osteoarthritis of both hands  ?  Appreciate rheum eval - f/u PRN with them.  ?Continue CBD salve with benefit.  ?Discussed tramadol use - hasn't tried this yet.  ?  ?  ?  ? ?Meds ordered this encounter  ?Medications  ? metoprolol succinate (TOPROL-XL) 25 MG 24 hr tablet  ?  Sig: Take 0.5 tablets (12.5 mg total) by mouth daily.  ?  Dispense:  45 tablet  ?  Refill:  1  ? ?No orders of the defined types were placed in this encounter. ? ? ? ?Patient Instructions  ?BP is staying a bit elevated - add toprol XL '25mg'$  1/2 tablet daily (12.'5mg'$ ).  ?Continue other medicines as up to now.  ?Continue monitoring blood pressure and pulse at home, let me know if any trouble tolerating the new medicine.  ?Return as needed or in 6 months for wellness visit ? ?Follow up plan: ?Return in about 6 months (around 01/20/2022) for medicare wellness visit, follow up visit. ? ?Ria Bush, MD   ?

## 2021-07-20 NOTE — Assessment & Plan Note (Signed)
Appreciate rheum eval - f/u PRN with them.  ?Continue CBD salve with benefit.  ?Discussed tramadol use - hasn't tried this yet.  ?

## 2021-07-20 NOTE — Assessment & Plan Note (Addendum)
Appreciate nephrology care Carrie Lucas) - thought hypertensive nephropathy.  ?Encouraged good hydration status and avoiding nephrotoxins.  ?See below for hypertension plan.  ?

## 2021-07-23 ENCOUNTER — Encounter: Payer: Self-pay | Admitting: Family Medicine

## 2021-07-23 DIAGNOSIS — L821 Other seborrheic keratosis: Secondary | ICD-10-CM | POA: Diagnosis not present

## 2021-07-23 DIAGNOSIS — Z85828 Personal history of other malignant neoplasm of skin: Secondary | ICD-10-CM | POA: Diagnosis not present

## 2021-07-23 DIAGNOSIS — D225 Melanocytic nevi of trunk: Secondary | ICD-10-CM | POA: Diagnosis not present

## 2021-07-23 DIAGNOSIS — D1801 Hemangioma of skin and subcutaneous tissue: Secondary | ICD-10-CM | POA: Diagnosis not present

## 2021-07-23 DIAGNOSIS — L814 Other melanin hyperpigmentation: Secondary | ICD-10-CM | POA: Diagnosis not present

## 2021-07-23 DIAGNOSIS — D485 Neoplasm of uncertain behavior of skin: Secondary | ICD-10-CM | POA: Diagnosis not present

## 2021-07-23 DIAGNOSIS — L570 Actinic keratosis: Secondary | ICD-10-CM | POA: Diagnosis not present

## 2021-07-29 ENCOUNTER — Ambulatory Visit
Admission: RE | Admit: 2021-07-29 | Discharge: 2021-07-29 | Disposition: A | Discharge status: Home / Self Care | Payer: Medicare Other | Source: Ambulatory Visit | Attending: Family Medicine | Admitting: Family Medicine

## 2021-07-29 DIAGNOSIS — M85852 Other specified disorders of bone density and structure, left thigh: Secondary | ICD-10-CM

## 2021-07-29 DIAGNOSIS — M8589 Other specified disorders of bone density and structure, multiple sites: Secondary | ICD-10-CM | POA: Diagnosis not present

## 2021-07-29 DIAGNOSIS — Z780 Asymptomatic menopausal state: Secondary | ICD-10-CM | POA: Diagnosis not present

## 2021-08-01 ENCOUNTER — Encounter: Payer: Self-pay | Admitting: Family Medicine

## 2021-08-04 ENCOUNTER — Encounter: Payer: Self-pay | Admitting: Family Medicine

## 2021-08-04 DIAGNOSIS — D239 Other benign neoplasm of skin, unspecified: Secondary | ICD-10-CM

## 2021-08-04 HISTORY — DX: Other benign neoplasm of skin, unspecified: D23.9

## 2021-08-07 ENCOUNTER — Telehealth: Payer: Self-pay

## 2021-08-07 NOTE — Telephone Encounter (Signed)
Faxed received from pharmacy that Tramadol '50mg'$  tablet needs a prior authorization. Prior authorization sent to plan via [email or web address] which states, "Your information has been submitted to [place] [employer] Part D. [employer] Part D will review the request and will issue a decision, typically within 1-3 days from your submission. You can check the updated outcome later by reopening this request." ?

## 2021-08-10 NOTE — Telephone Encounter (Signed)
Received PA approval for qty opioid 7 day, valid 04/26/2021- 4/13/204.   Fyi to Dr. Darnell Level.  ?

## 2021-08-11 NOTE — Telephone Encounter (Signed)
Noted. Had not yet tried tramadol when we last discussed.  ?

## 2021-10-05 ENCOUNTER — Encounter: Payer: Self-pay | Admitting: Family Medicine

## 2021-10-05 ENCOUNTER — Ambulatory Visit (INDEPENDENT_AMBULATORY_CARE_PROVIDER_SITE_OTHER)
Admission: RE | Admit: 2021-10-05 | Discharge: 2021-10-05 | Disposition: A | Discharge status: Home / Self Care | Payer: Medicare Other | Source: Ambulatory Visit | Attending: Family Medicine | Admitting: Family Medicine

## 2021-10-05 ENCOUNTER — Ambulatory Visit (INDEPENDENT_AMBULATORY_CARE_PROVIDER_SITE_OTHER): Payer: Medicare Other | Admitting: Family Medicine

## 2021-10-05 VITALS — BP 140/64 | HR 58 | Temp 97.6°F | Ht 61.0 in | Wt 154.2 lb

## 2021-10-05 DIAGNOSIS — G5703 Lesion of sciatic nerve, bilateral lower limbs: Secondary | ICD-10-CM

## 2021-10-05 DIAGNOSIS — M48061 Spinal stenosis, lumbar region without neurogenic claudication: Secondary | ICD-10-CM | POA: Insufficient documentation

## 2021-10-05 NOTE — Progress Notes (Signed)
Patient ID: Carrie Lucas, female    DOB: October 24, 1940, 81 y.o.   MRN: [id number]  This visit was conducted in person.  BP 140/64   Pulse (!) 58   Temp 97.6 F (36.4 C) (Temporal)   Ht '5\' 1"'$  (1.549 m)   Wt 154 lb 4 oz (70 kg)   SpO2 94%   BMI 29.15 kg/m    CC: discuss PT Subjective:   HPI: Carrie Lucas is a 81 y.o. female presenting on 10/05/2021 for Buttock Pain (C/o bilateral buttock pain- worse in L and sometimes radiates down to leg.  Started about 2 mos ago after being on hands/knees in the garden.  Pain occurs when walking, standing and rolling over in bed. )   2 mo h/o bilateral buttock pain - started after planting flowers 07/25/2021. Next day started having bilateral buttock pain, some better than 2 months ago but still present. Worse pain with prolonged standing and walking, turning over in bed. No pain with sitting or laying supine. Occasional shooting pain down posterior L>R leg to foot. No numbness or tingling or weakness of leg, no bowel/bladder accidents, no saddle anesthesia, no fevers/chills.   Has tried heating pad, tried home exercises recommended by physical therapy friend, topical mineral oil all with limited benefit.   H/o erosive osteoarthritis of B hands, has seen rheumatology  ANA positive, rheum eval negative.      Relevant past medical, surgical, family and social history reviewed and updated as indicated. Interim medical history since our last visit reviewed. Allergies and medications reviewed and updated. Outpatient Medications Prior to Visit  Medication Sig Dispense Refill   acetaminophen (TYLENOL) 650 MG CR tablet Take 1 tablet (650 mg total) by mouth 3 (three) times daily as needed for pain.     amLODipine (NORVASC) 5 MG tablet Take 1 tablet by mouth once daily 90 tablet 2   aspirin EC 81 MG tablet Take 1 tablet (81 mg total) by mouth every Monday, Wednesday, and Friday.     atorvastatin (LIPITOR) 20 MG tablet Take 1 tablet by mouth once daily 90  tablet 2   metoprolol succinate (TOPROL-XL) 25 MG 24 hr tablet Take 0.5 tablets (12.5 mg total) by mouth daily. 45 tablet 1   valsartan (DIOVAN) 320 MG tablet Take 1 tablet by mouth once daily 90 tablet 2   No facility-administered medications prior to visit.     Per HPI unless specifically indicated in ROS section below Review of Systems  Objective:  BP 140/64   Pulse (!) 58   Temp 97.6 F (36.4 C) (Temporal)   Ht '5\' 1"'$  (1.549 m)   Wt 154 lb 4 oz (70 kg)   SpO2 94%   BMI 29.15 kg/m   Wt Readings from Last 3 Encounters:  10/05/21 154 lb 4 oz (70 kg)  07/20/21 152 lb 2 oz (69 kg)  03/05/21 152 lb (68.9 kg)      Physical Exam Vitals and nursing note reviewed.  Constitutional:      Appearance: Normal appearance. She is not ill-appearing.  Musculoskeletal:        General: Normal range of motion.     Right lower leg: No edema.     Left lower leg: No edema.     Comments:  No pain midline spine No paraspinous mm tenderness Neg SLR bilaterally. No pain with int/ext rotation at hip. Neg FABER. No pain at SIJ, GTB bilaterally.  Mild discomfort to L>R sciatic notch, marked tightness  of bilateral gluteus muscles  Skin:    General: Skin is warm and dry.     Findings: No rash.  Neurological:     Mental Status: She is alert.     Sensory: Sensation is intact.     Motor: Motor function is intact.     Coordination: Coordination is intact.     Deep Tendon Reflexes:     Reflex Scores:      Patellar reflexes are 2+ on the right side and 1+ on the left side.    Comments:  Diminished DTRs on left Sensation intact 5/5 strength BLE  Psychiatric:        Mood and Affect: Mood normal.        Behavior: Behavior normal.        Assessment & Plan:   Problem List Items Addressed This Visit     Piriformis syndrome of both sides - Primary    Presumed L>R sciatica due to piriformis syndrome. Ongoing for 2+ months. Check lumbar films. Refer for formal PT. Update if not improving with  this. Avoid NSAIDs in CKD hx.       Relevant Orders   Ambulatory referral to Physical Therapy   DG Lumbar Spine Complete     No orders of the defined types were placed in this encounter.  Orders Placed This Encounter  Procedures   DG Lumbar Spine Complete    Standing Status:   Future    Number of Occurrences:   1    Standing Expiration Date:   10/06/2022    Order Specific Question:   Reason for Exam (SYMPTOM  OR DIAGNOSIS REQUIRED)    Answer:   presumed piriformis L>R with sciatica    Order Specific Question:   Preferred imaging location?    Answer:   [facility]   Ambulatory referral to Physical Therapy    Referral Priority:   Routine    Referral Type:   Physical Medicine    Referral Reason:   Specialty Services Required    Requested Specialty:   Physical Therapy    Number of Visits Requested:   1     Patient Instructions  Lower back xrays today.  We will refer you to physical therapy.  Let me know how you do with this.   Follow up plan: Return if symptoms worsen or fail to improve.  Ria Bush, MD

## 2021-10-05 NOTE — Assessment & Plan Note (Signed)
Presumed L>R sciatica due to piriformis syndrome. Ongoing for 2+ months. Check lumbar films. Refer for formal PT. Update if not improving with this. Avoid NSAIDs in CKD hx.

## 2021-10-05 NOTE — Patient Instructions (Addendum)
Lower back xrays today.  We will refer you to physical therapy.  Let me know how you do with this.

## 2021-10-12 NOTE — Therapy (Signed)
OUTPATIENT PHYSICAL THERAPY THORACOLUMBAR EVALUATION   Patient Name: Carrie Lucas MRN: [id number] DOB:[birth year], 81 y.o., female Today's Date: 10/13/2021   PT End of Session - 10/13/21 1130     Visit Number 1    Number of Visits 12    Progress Note Due on Visit 10    PT Start Time 6789    PT Stop Time 1104    PT Time Calculation (min) 49 min    Activity Tolerance Patient tolerated treatment well    Behavior During Therapy Carrie Lucas for tasks assessed/performed             Past Medical History:  Diagnosis Date   Arthritis    R middle finger, chronic arthralgias   BCC (basal cell carcinoma of skin) 06/2015, 06/2017   supraclavicular, nasal bridge, R lower back - Carrie Lucas   Carotid stenosis, asymptomatic 12/16/2014   Mild bilateral 1-39%, consider rpt [place] 1 yr (11/2014)    Chronic renal disease, stage III (HCC)    Colon polyp    DDD (degenerative disc disease), lumbar 2014   severe facet degeneration L4/5, mod spinal setnosis, mild foraminal stenosis   Dysplastic nevus 08/04/2021   To mid upper back, with severe atypia, rec re-excision (Carrie Lucas) 06/2021   Headache    Hepatic steatosis 2012   mild by CT   History of melanoma 2003   face (Dr Carrie Lucas)   HLD (hyperlipidemia)    HTN (hypertension)    IBS (irritable bowel syndrome)    Insomnia    LPRD (laryngopharyngeal reflux disease)    Carrie Lucas)   Osteopenia 2011   DEXA T -1.6 hip, -1.3 forearm (12/2015)   Sialadenitis 05/2013   acute Carrie Lucas)   SK (seborrheic keratosis) 06/2020   L lateral upper back (Carrie Lucas)   Past Surgical History:  Procedure Laterality Date   ABI  06/2010   WNL   BACK SURGERY  1995   lumbar   BILATERAL OOPHORECTOMY  2007   ovarian cysts   BLADDER SUSPENSION     midurethral sling for SUI   CARPAL TUNNEL RELEASE Bilateral 2000   COLONOSCOPY  '8 30 12   '$ TAx1, diverticulosis, rpt 5 yrs (Carrie Lucas)   COLONOSCOPY  03/2016   WNL (Carrie Lucas)   DEXA  2011   mild osteopenia (T -1.2 R femur)   ESI  2014   L  L4 transforaminal   ESOPHAGOGASTRODUODENOSCOPY  12/24/2010   reflux esophagitis   MOHS SURGERY  2016   BCC   RADIAL KERATOTOMY Left    TONSILLECTOMY  Childhood   VAGINAL HYSTERECTOMY     bladder drop   Patient Active Problem List   Diagnosis Date Noted   Piriformis syndrome of both sides 10/05/2021   Dysplastic nevus 08/04/2021   Hypercalcemia 08/01/2021   Positive ANA (antinuclear antibody) 03/05/2021   Erosive osteoarthritis of both hands 01/20/2021   Skin rash 07/16/2020   Chronic venous insufficiency of lower extremity 07/01/2020   Paresthesia of left lower extremity 07/01/2020   Varicose veins of leg with pain, right 07/01/2020   Lymphocytosis 01/07/2020   Bilateral hearing loss 01/07/2020   Dizziness 07/23/2019   Vision problem 07/05/2019   Pedal edema 09/11/2018   Renal cyst, acquired, right 07/08/2018   Spondylolisthesis, lumbar region 12/20/2017   UTI (urinary tract infection) 10/22/2016   BPPV (benign paroxysmal positional vertigo), left 09/07/2016   Advanced care planning/counseling discussion 12/16/2014   Carotid stenosis, asymptomatic 12/16/2014   Pterygium of both eyes 05/06/2014   History of radial keratotomy  03/13/2014   Medicare annual wellness visit, subsequent 12/12/2013   CKD (chronic kidney disease) stage 3, GFR 30-59 ml/min (HCC) 12/12/2013   DDD (degenerative disc disease), lumbar    Hepatic steatosis    Osteopenia    HTN (hypertension)    HLD (hyperlipidemia)    LPRD (laryngopharyngeal reflux disease)    Insomnia    IBS (irritable bowel syndrome)    Osteoarthritis    Abdominal bloating 09/09/2013    PCP: Carrie Bush, MD  REFERRING PROVIDER: Ria Bush, MD  REFERRING DIAG: G57.03 (ICD-10-CM) - Piriformis syndrome of both sides   Rationale for Evaluation and Treatment Rehabilitation  THERAPY DIAG:  Abnormal posture  Difficulty in walking, not elsewhere classified  Muscle weakness (generalized)  Radiculopathy, lumbar  region  ONSET DATE:   SUBJECTIVE:                                                                                                                                                                                           SUBJECTIVE STATEMENT: In early April, Carrie Lucas started doing some yard work.  A few days later, her gluteals were really bothering her.  It bothers her at night, when standing and walking.  Less trouble with sitting.  She also gets L sciatica to the toes.  PERTINENT HISTORY:  OA, stage 3 chronic renal disease, severe DDD L4-5, HLD, HTN, previous low back surgery in 1995, chronic venous insufficiency, HOH, dizziness/BPPV, pedal edema, carotid stenosis   PAIN:  Are you having pain? Yes: NPRS scale: 1-5/10 Pain location: B butt cheeks and L leg to the toes Pain description: Steady ache Aggravating factors: Standing and walking Relieving factors: Sitting   PRECAUTIONS: Other: Previous back surgery so flexion and flexion with rotation are discouraged  WEIGHT BEARING RESTRICTIONS No  FALLS:  Has patient fallen in last 6 months? No  LIVING ENVIRONMENT: Lives with: lives with their family Lives in: House/apartment Stairs:  Uses a handrail Has following equipment at home: None  OCCUPATION: Retired  PLOF: Independent  PATIENT GOALS Return to walking normally   OBJECTIVE:   DIAGNOSTIC FINDINGS:  Lumbar vertebral body height are preserved without evidence of fracture. Grade 1 anterolisthesis of L4 on L5. No spondylolysis identified. Severe intervertebral disc space narrowing at L4-L5 and moderate narrowing at L5-S1. Facet arthropathy.  PATIENT SURVEYS:  FOTO 52, Goal 64 in 11 visits  SCREENING FOR RED FLAGS: Bowel or bladder incontinence: No Spinal tumors: No Cauda equina syndrome: No Compression fracture: No Abdominal aneurysm: No  COGNITION:  Overall cognitive status: Within functional limits for tasks assessed     SENSATION: Carrie Lucas notes occasional L  sciatica to  the toes  MUSCLE LENGTH: Hamstrings: Right 35 deg; Left 35 deg  POSTURE: rounded shoulders, forward head, decreased lumbar lordosis, and flexed trunk    LUMBAR ROM:   Active  A/PROM  eval  Flexion   Extension 5  Right lateral flexion   Left lateral flexion   Right rotation   Left rotation    (Blank rows = not tested)  LOWER EXTREMITY ROM:     Passive  Right eval Left eval  Hip flexion 95 95  Hip extension    Hip abduction    Hip adduction    Hip internal rotation 15 15  Hip external rotation 23 12  Knee flexion    Knee extension    Ankle dorsiflexion    Ankle plantarflexion    Ankle inversion    Ankle eversion     (Blank rows = not tested)  LOWER EXTREMITY MMT:    MMT Right eval Left eval  Spine strength test Junnie was unable to assume postures required for spine strength testing     GAIT: Distance walked: 100+ feet Assistive device utilized: None Level of assistance: Complete Independence Comments: Forward lean at the trunk and pain increases with increased standing and walking    TODAY'S TREATMENT  10/13/2021 Lumbar extension AROM (limited range, hips forward) 10X 3 seconds Shoulder blade pinches 10X 5 seconds Figure 4 stretch 4X 20 seconds B Hamstrings stretch (other leg straight) 4X 20 seconds B  Functional Activities: Review imaging, log roll, mechanics with yard work and Careers adviser education   PATIENT EDUCATION:  Education details: See above Person educated: Patient Education method: Consulting civil engineer, Media planner, Corporate treasurer cues, Verbal cues, and Handouts Education comprehension: verbalized understanding, returned demonstration, verbal cues required, tactile cues required, and needs further education   HOME EXERCISE PROGRAM: Access Code: [email or web address] URL: [email or web address] Date: 10/13/2021 Prepared by: Vista Mink  Exercises - Supine Figure 4 Piriformis Stretch  - 2-3 x daily - 7 x weekly - 1  sets - 5 reps - 20 seconds hold - Supine Hamstring Stretch  - 2-3 x daily - 7 x weekly - 1 sets - 5 reps - 20 seconds hold - Standing Lumbar Extension at [place]  - 5 x daily - 7 x weekly - 1 sets - 5 reps - 3 seconds hold - Standing Scapular Retraction  - 5 x daily - 7 x weekly - 1 sets - 5 reps - 5 second hold  ASSESSMENT:  CLINICAL IMPRESSION: Patient is a 81 y.o. female who was seen today for physical therapy evaluation and treatment for B piriformis syndrome.    OBJECTIVE IMPAIRMENTS Abnormal gait, decreased activity tolerance, decreased endurance, decreased knowledge of condition, difficulty walking, decreased ROM, decreased strength, decreased safety awareness, impaired perceived functional ability, increased muscle spasms, impaired flexibility, improper body mechanics, postural dysfunction, and pain.   ACTIVITY LIMITATIONS bending, standing, sleeping, stairs, bed mobility, and locomotion level  PARTICIPATION LIMITATIONS: community activity and yard work  PERSONAL FACTORS OA, stage 3 chronic renal disease, severe DDD L4-5, HLD, HTN, previous low back surgery in 1995, chronic venous insufficiency, HOH, dizziness/BPPV, pedal edema, carotid stenosis  are also affecting patient's functional outcome.   REHAB POTENTIAL: Good  CLINICAL DECISION MAKING: Stable/uncomplicated  EVALUATION COMPLEXITY: Low   GOALS: Goals reviewed with patient? Yes  SHORT TERM GOALS: Target date: 11/10/2021  Improve lumbar extension to a pain free 10 degrees Baseline: Painful 5 degrees Goal status: INITIAL  2.  Taniaya will be able to demonstrate correct log roll,  use a lumbar roll correctly and demonstrate correct golfer's and diagonal squat lift techniques Baseline: Started at evaluation Goal status: INITIAL  3.  Haden will report no L sciatica Baseline: L sciatica to the toes Goal status: INITIAL   LONG TERM GOALS: Target date: 12/08/2021  Improve FOTO to 64 Baseline: 52 Goal status:  INITIAL  2.  Ayana will report low back and B gluteal pain consistently 0-3/10 on the Numeric Pain Rating Scale Baseline: 1-5/10 Goal status: INITIAL  3.  Improve B LE flexibility for: hip flexors 105; hamstrings 45; hip ER 40 degrees Baseline: 95; 35; 12/23 respectively Goal status: INITIAL  4.  Kelita will demonstrate improved spine strength by the prone spine strength test and FOTO scores Baseline: See FOTO and unable to assume test positions for the prone spine strength test Goal status: INITIAL  5.  Tien will be independent with her HEP at DC Baseline: Started 10/13/2021 Goal status: INITIAL   PLAN: PT FREQUENCY: 1-2x/week  PT DURATION: 8 weeks  PLANNED INTERVENTIONS: Therapeutic exercises, Therapeutic activity, Gait training, Patient/Family education, Joint mobilization, Stair training, Dry Needling, Electrical stimulation, Cryotherapy, Traction, and Manual therapy.  PLAN FOR NEXT SESSION: Review HEP, start HT Raises with transversus abdominus activation, hip hike in doorway, practical posture and body mechanics, theraband rows   Farley Ly, PT, MPT 10/13/2021, 11:32 AM

## 2021-10-13 ENCOUNTER — Ambulatory Visit (INDEPENDENT_AMBULATORY_CARE_PROVIDER_SITE_OTHER): Payer: Medicare Other | Admitting: Rehabilitative and Restorative Service Providers"

## 2021-10-13 ENCOUNTER — Encounter: Payer: Self-pay | Admitting: Rehabilitative and Restorative Service Providers"

## 2021-10-13 DIAGNOSIS — R262 Difficulty in walking, not elsewhere classified: Secondary | ICD-10-CM | POA: Diagnosis not present

## 2021-10-13 DIAGNOSIS — M6281 Muscle weakness (generalized): Secondary | ICD-10-CM

## 2021-10-13 DIAGNOSIS — R293 Abnormal posture: Secondary | ICD-10-CM | POA: Diagnosis not present

## 2021-10-13 DIAGNOSIS — M5416 Radiculopathy, lumbar region: Secondary | ICD-10-CM

## 2021-10-15 ENCOUNTER — Encounter: Payer: Self-pay | Admitting: Physical Therapy

## 2021-10-15 ENCOUNTER — Ambulatory Visit (INDEPENDENT_AMBULATORY_CARE_PROVIDER_SITE_OTHER): Payer: Medicare Other | Admitting: Physical Therapy

## 2021-10-15 DIAGNOSIS — M5416 Radiculopathy, lumbar region: Secondary | ICD-10-CM

## 2021-10-15 DIAGNOSIS — M6281 Muscle weakness (generalized): Secondary | ICD-10-CM | POA: Diagnosis not present

## 2021-10-15 DIAGNOSIS — R262 Difficulty in walking, not elsewhere classified: Secondary | ICD-10-CM

## 2021-10-15 DIAGNOSIS — R293 Abnormal posture: Secondary | ICD-10-CM | POA: Diagnosis not present

## 2021-10-15 NOTE — Therapy (Signed)
OUTPATIENT PHYSICAL THERAPY TREATMENT NOTE   Patient Name: Carrie Lucas MRN: [id number] DOB:12-26-1940, 81 y.o., female Today's Date: 10/15/2021   END OF SESSION:   PT End of Session - 10/15/21 0843     Visit Number 2    Number of Visits 12    Progress Note Due on Visit 10    PT Start Time 0845    PT Stop Time 0930    PT Time Calculation (min) 45 min    Activity Tolerance Patient tolerated treatment well    Behavior During Therapy [facility] for tasks assessed/performed             Past Medical History:  Diagnosis Date   Arthritis    R middle finger, chronic arthralgias   BCC (basal cell carcinoma of skin) 06/2015, 06/2017   supraclavicular, nasal bridge, R lower back - Carrie Lucas   Carotid stenosis, asymptomatic 12/16/2014   Mild bilateral 1-39%, consider rpt [place] 1 yr (11/2014)    Chronic renal disease, stage III (HCC)    Colon polyp    DDD (degenerative disc disease), lumbar 2014   severe facet degeneration L4/5, mod spinal setnosis, mild foraminal stenosis   Dysplastic nevus 08/04/2021   To mid upper back, with severe atypia, rec re-excision (Carrie Lucas) 06/2021   Headache    Hepatic steatosis 2012   mild by CT   History of melanoma 2003   face (Dr Carrie Lucas)   HLD (hyperlipidemia)    HTN (hypertension)    IBS (irritable bowel syndrome)    Insomnia    LPRD (laryngopharyngeal reflux disease)    Carrie Lucas)   Osteopenia 2011   DEXA T -1.6 hip, -1.3 forearm (12/2015)   Sialadenitis 05/2013   acute Carrie Lucas)   SK (seborrheic keratosis) 06/2020   L lateral upper back (Carrie Lucas)   Past Surgical History:  Procedure Laterality Date   ABI  06/2010   WNL   BACK SURGERY  1995   lumbar   BILATERAL OOPHORECTOMY  2007   ovarian cysts   BLADDER SUSPENSION     midurethral sling for SUI   CARPAL TUNNEL RELEASE Bilateral 2000   COLONOSCOPY  '8 30 12   '$ TAx1, diverticulosis, rpt 5 yrs (Carrie Lucas)   COLONOSCOPY  03/2016   WNL (Carrie Lucas)   DEXA  2011   mild osteopenia (T -1.2 R femur)   ESI   2014   L L4 transforaminal   ESOPHAGOGASTRODUODENOSCOPY  12/24/2010   reflux esophagitis   MOHS SURGERY  2016   BCC   RADIAL KERATOTOMY Left    TONSILLECTOMY  Childhood   VAGINAL HYSTERECTOMY     bladder drop   Patient Active Problem List   Diagnosis Date Noted   Piriformis syndrome of both sides 10/05/2021   Dysplastic nevus 08/04/2021   Hypercalcemia 08/01/2021   Positive ANA (antinuclear antibody) 03/05/2021   Erosive osteoarthritis of both hands 01/20/2021   Skin rash 07/16/2020   Chronic venous insufficiency of lower extremity 07/01/2020   Paresthesia of left lower extremity 07/01/2020   Varicose veins of leg with pain, right 07/01/2020   Lymphocytosis 01/07/2020   Bilateral hearing loss 01/07/2020   Dizziness 07/23/2019   Vision problem 07/05/2019   Pedal edema 09/11/2018   Renal cyst, acquired, right 07/08/2018   Spondylolisthesis, lumbar region 12/20/2017   UTI (urinary tract infection) 10/22/2016   BPPV (benign paroxysmal positional vertigo), left 09/07/2016   Advanced care planning/counseling discussion 12/16/2014   Carotid stenosis, asymptomatic 12/16/2014   Pterygium of both eyes 05/06/2014  History of radial keratotomy 03/13/2014   Medicare annual wellness visit, subsequent 12/12/2013   CKD (chronic kidney disease) stage 3, GFR 30-59 ml/min (HCC) 12/12/2013   DDD (degenerative disc disease), lumbar    Hepatic steatosis    Osteopenia    HTN (hypertension)    HLD (hyperlipidemia)    LPRD (laryngopharyngeal reflux disease)    Insomnia    IBS (irritable bowel syndrome)    Osteoarthritis    Abdominal bloating 09/09/2013      THERAPY DIAG:  Abnormal posture  Difficulty in walking, not elsewhere classified  Muscle weakness (generalized)  Radiculopathy, lumbar region  PCP: Ria Bush, MD   REFERRING PROVIDER: Ria Bush, MD   REFERRING DIAG: G57.03 (ICD-10-CM) - Piriformis syndrome of both sides    Rationale for Evaluation and  Treatment Rehabilitation   ONSET DATE:    SUBJECTIVE:                                                                                                                                                                                            SUBJECTIVE STATEMENT: Pain is the same, nothing has changed yet but this is her first treatment appointment after evaluation   PERTINENT HISTORY:  OA, stage 3 chronic renal disease, severe DDD L4-5, HLD, HTN, previous low back surgery in 1995, chronic venous insufficiency, HOH, dizziness/BPPV, pedal edema, carotid stenosis    PAIN:  Are you having pain? Yes: NPRS scale: 1-5/10 Pain location: B butt cheeks and L leg to the toes Pain description: Steady ache Aggravating factors: Standing and walking Relieving factors: Sitting     PRECAUTIONS: Other: Previous back surgery so flexion and flexion with rotation are discouraged   WEIGHT BEARING RESTRICTIONS No   FALLS:  Has patient fallen in last 6 months? No   LIVING ENVIRONMENT: Lives with: lives with their family Lives in: House/apartment Stairs:  Uses a handrail Has following equipment at home: None   OCCUPATION: Retired   PLOF: Independent   PATIENT GOALS Return to walking normally     OBJECTIVE:    DIAGNOSTIC FINDINGS:  Lumbar vertebral body height are preserved without evidence of fracture. Grade 1 anterolisthesis of L4 on L5. No spondylolysis identified. Severe intervertebral disc space narrowing at L4-L5 and moderate narrowing at L5-S1. Facet arthropathy.   PATIENT SURVEYS:  FOTO 52, Goal 64 in 11 visits   SCREENING FOR RED FLAGS: Bowel or bladder incontinence: No Spinal tumors: No Cauda equina syndrome: No Compression fracture: No Abdominal aneurysm: No   COGNITION:           Overall cognitive status: Within functional limits for tasks assessed  SENSATION: Carrie Lucas notes occasional L sciatica to the toes   MUSCLE LENGTH: Hamstrings: Right 35  deg; Left 35 deg   POSTURE: rounded shoulders, forward head, decreased lumbar lordosis, and flexed trunk      LUMBAR ROM:    Active  A/PROM  eval  Flexion    Extension 5  Right lateral flexion    Left lateral flexion    Right rotation    Left rotation     (Blank rows = not tested)   LOWER EXTREMITY ROM:      Passive  Right eval Left eval  Hip flexion 95 95  Hip extension      Hip abduction      Hip adduction      Hip internal rotation 15 15  Hip external rotation 23 12  Knee flexion      Knee extension      Ankle dorsiflexion      Ankle plantarflexion      Ankle inversion      Ankle eversion       (Blank rows = not tested)   LOWER EXTREMITY MMT:     MMT Right eval Left eval  Spine strength test Areli was unable to assume postures required for spine strength testing        GAIT: Distance walked: 100+ feet Assistive device utilized: None Level of assistance: Complete Independence Comments: Forward lean at the trunk and pain increases with increased standing and walking       TODAY'S TREATMENT  10/15/21 Nu Step L5 X 6 min seat #4 UE/LE Rt lateral trunk shift X 10 holding 3 seconds Lumbar extension AROM (limited range, hips forward) 10X 3 seconds Heel and toe raises with TAC X 10 each Shoulder blade pinches 10X 5 seconds Figure 4 stretch 3X 20 seconds B Hamstrings stretch (other leg straight) 3X 20 seconds B Mod thomas stretch with left leg off EOB 3X60 sec  Manual therapy: left leg long axis distraction and manual ITB stetching   10/13/2021 Lumbar extension AROM (limited range, hips forward) 10X 3 seconds Shoulder blade pinches 10X 5 seconds Figure 4 stretch 4X 20 seconds B Hamstrings stretch (other leg straight) 4X 20 seconds B   Functional Activities: Review imaging, log roll, mechanics with yard work and Careers adviser education     PATIENT EDUCATION:  Education details: See above Person educated: Patient Education method:  Consulting civil engineer, Media planner, Corporate treasurer cues, Verbal cues, and Handouts Education comprehension: verbalized understanding, returned demonstration, verbal cues required, tactile cues required, and needs further education     HOME EXERCISE PROGRAM: Access Code: [email or web address] URL: [email or web address] Date: 10/13/2021 Prepared by: Vista Mink   Exercises - Supine Figure 4 Piriformis Stretch  - 2-3 x daily - 7 x weekly - 1 sets - 5 reps - 20 seconds hold - Supine Hamstring Stretch  - 2-3 x daily - 7 x weekly - 1 sets - 5 reps - 20 seconds hold - Standing Lumbar Extension at [place]  - 5 x daily - 7 x weekly - 1 sets - 5 reps - 3 seconds hold - Standing Scapular Retraction  - 5 x daily - 7 x weekly - 1 sets - 5 reps - 5 second hold  Access Code: [email or web address] URL: [email or web address] Date: 10/15/2021 Prepared by: Elsie Ra  Exercises - Slump Stretch  - 2 x daily - 6 x weekly - 1-2 sets - 10 reps - 3 sec hold - Right Standing Lateral Shift Correction at [place] -  Repetitions  - 2 x daily - 6 x weekly - 1-2 sets - 10 reps - 3 sec hold - Thomas Stretch on Table  - 2 x daily - 6 x weekly - 1 sets - 3 reps - 60 sec hold   ASSESSMENT:   CLINICAL IMPRESSION: I do feel she has a lateral trunk shift with shoulders shifted right of hips. I did give her exercise for this today as well as sciatic nerve glide due to complaints of left sided sciatica and mod thomas stretch for some self hip distraction. We reviewed her otherHEP and she showed good understanding of this and I added some new ones to see if they give her any additional benefit. See right above for details     OBJECTIVE IMPAIRMENTS Abnormal gait, decreased activity tolerance, decreased endurance, decreased knowledge of condition, difficulty walking, decreased ROM, decreased strength, decreased safety awareness, impaired perceived functional ability, increased muscle spasms, impaired flexibility, improper body  mechanics, postural dysfunction, and pain.    ACTIVITY LIMITATIONS bending, standing, sleeping, stairs, bed mobility, and locomotion level   PARTICIPATION LIMITATIONS: community activity and yard work   PERSONAL FACTORS OA, stage 3 chronic renal disease, severe DDD L4-5, HLD, HTN, previous low back surgery in 1995, chronic venous insufficiency, HOH, dizziness/BPPV, pedal edema, carotid stenosis  are also affecting patient's functional outcome.    REHAB POTENTIAL: Good   CLINICAL DECISION MAKING: Stable/uncomplicated   EVALUATION COMPLEXITY: Low     GOALS: Goals reviewed with patient? Yes   SHORT TERM GOALS: Target date: 11/10/2021   Improve lumbar extension to a pain free 10 degrees Baseline: Painful 5 degrees Goal status: INITIAL   2.  Latoya will be able to demonstrate correct log roll, use a lumbar roll correctly and demonstrate correct golfer's and diagonal squat lift techniques Baseline: Started at evaluation Goal status: INITIAL   3.  Carita will report no L sciatica Baseline: L sciatica to the toes Goal status: INITIAL     LONG TERM GOALS: Target date: 12/08/2021   Improve FOTO to 64 Baseline: 52 Goal status: INITIAL   2.  Eulonda will report low back and B gluteal pain consistently 0-3/10 on the Numeric Pain Rating Scale Baseline: 1-5/10 Goal status: INITIAL   3.  Improve B LE flexibility for: hip flexors 105; hamstrings 45; hip ER 40 degrees Baseline: 95; 35; 12/23 respectively Goal status: INITIAL   4.  Jirah will demonstrate improved spine strength by the prone spine strength test and FOTO scores Baseline: See FOTO and unable to assume test positions for the prone spine strength test Goal status: INITIAL   5.  Myalynn will be independent with her HEP at DC Baseline: Started 10/13/2021 Goal status: INITIAL     PLAN: PT FREQUENCY: 1-2x/week   PT DURATION: 8 weeks   PLANNED INTERVENTIONS: Therapeutic exercises, Therapeutic activity, Gait training,  Patient/Family education, Joint mobilization, Stair training, Dry Needling, Electrical stimulation, Cryotherapy, Traction, and Manual therapy.   PLAN FOR NEXT SESSION: how was new exercises? when able try hip hike in doorway, practical posture and body mechanics, theraband rows    Debbe Odea, PT,DPT 10/15/2021, 9:34 AM

## 2021-10-20 ENCOUNTER — Encounter: Payer: Self-pay | Admitting: Physical Therapy

## 2021-10-20 ENCOUNTER — Ambulatory Visit (INDEPENDENT_AMBULATORY_CARE_PROVIDER_SITE_OTHER): Payer: Medicare Other | Admitting: Physical Therapy

## 2021-10-20 DIAGNOSIS — R293 Abnormal posture: Secondary | ICD-10-CM | POA: Diagnosis not present

## 2021-10-20 DIAGNOSIS — M5416 Radiculopathy, lumbar region: Secondary | ICD-10-CM

## 2021-10-20 DIAGNOSIS — R262 Difficulty in walking, not elsewhere classified: Secondary | ICD-10-CM

## 2021-10-20 DIAGNOSIS — M6281 Muscle weakness (generalized): Secondary | ICD-10-CM | POA: Diagnosis not present

## 2021-10-22 ENCOUNTER — Ambulatory Visit (INDEPENDENT_AMBULATORY_CARE_PROVIDER_SITE_OTHER): Payer: Medicare Other | Admitting: Rehabilitative and Restorative Service Providers"

## 2021-10-22 ENCOUNTER — Encounter: Payer: Self-pay | Admitting: Rehabilitative and Restorative Service Providers"

## 2021-10-22 DIAGNOSIS — M6281 Muscle weakness (generalized): Secondary | ICD-10-CM

## 2021-10-22 DIAGNOSIS — R262 Difficulty in walking, not elsewhere classified: Secondary | ICD-10-CM

## 2021-10-22 DIAGNOSIS — R293 Abnormal posture: Secondary | ICD-10-CM | POA: Diagnosis not present

## 2021-10-22 DIAGNOSIS — M5416 Radiculopathy, lumbar region: Secondary | ICD-10-CM | POA: Diagnosis not present

## 2021-10-22 NOTE — Therapy (Signed)
OUTPATIENT PHYSICAL THERAPY TREATMENT NOTE   Patient Name: Carrie Lucas MRN: [id number] DOB:1940/05/15, 81 y.o., female Today's Date: 10/22/2021   END OF SESSION:   PT End of Session - 10/22/21 1155     Visit Number 4    Number of Visits 12    Progress Note Due on Visit 10    PT Start Time 1102    PT Stop Time 4627    PT Time Calculation (min) 40 min    Activity Tolerance Patient tolerated treatment well    Behavior During Therapy WFL for tasks assessed/performed              Past Medical History:  Diagnosis Date   Arthritis    R middle finger, chronic arthralgias   BCC (basal cell carcinoma of skin) 06/2015, 06/2017   supraclavicular, nasal bridge, R lower back - [name]   Carotid stenosis, asymptomatic 12/16/2014   Mild bilateral 1-39%, consider rpt [place] 1 yr (11/2014)    Chronic renal disease, stage III (HCC)    Colon polyp    DDD (degenerative disc disease), lumbar 2014   severe facet degeneration L4/5, mod spinal setnosis, mild foraminal stenosis   Dysplastic nevus 08/04/2021   To mid upper back, with severe atypia, rec re-excision ([name]) 06/2021   Headache    Hepatic steatosis 2012   mild by CT   History of melanoma 2003   face (Dr Luetta Nutting)   HLD (hyperlipidemia)    HTN (hypertension)    IBS (irritable bowel syndrome)    Insomnia    LPRD (laryngopharyngeal reflux disease)    [facility])   Osteopenia 2011   DEXA T -1.6 hip, -1.3 forearm (12/2015)   Sialadenitis 05/2013   acute [facility])   SK (seborrheic keratosis) 06/2020   L lateral upper back ([name])   Past Surgical History:  Procedure Laterality Date   ABI  06/2010   WNL   BACK SURGERY  1995   lumbar   BILATERAL OOPHORECTOMY  2007   ovarian cysts   BLADDER SUSPENSION     midurethral sling for SUI   CARPAL TUNNEL RELEASE Bilateral 2000   COLONOSCOPY  '8 30 12   '$ TAx1, diverticulosis, rpt 5 yrs (Outlaw)   COLONOSCOPY  03/2016   WNL (Outlaw)   DEXA  2011   mild osteopenia (T -1.2 R femur)   ESI   2014   L L4 transforaminal   ESOPHAGOGASTRODUODENOSCOPY  12/24/2010   reflux esophagitis   MOHS SURGERY  2016   BCC   RADIAL KERATOTOMY Left    TONSILLECTOMY  Childhood   VAGINAL HYSTERECTOMY     bladder drop   Patient Active Problem List   Diagnosis Date Noted   Piriformis syndrome of both sides 10/05/2021   Dysplastic nevus 08/04/2021   Hypercalcemia 08/01/2021   Positive ANA (antinuclear antibody) 03/05/2021   Erosive osteoarthritis of both hands 01/20/2021   Skin rash 07/16/2020   Chronic venous insufficiency of lower extremity 07/01/2020   Paresthesia of left lower extremity 07/01/2020   Varicose veins of leg with pain, right 07/01/2020   Lymphocytosis 01/07/2020   Bilateral hearing loss 01/07/2020   Dizziness 07/23/2019   Vision problem 07/05/2019   Pedal edema 09/11/2018   Renal cyst, acquired, right 07/08/2018   Spondylolisthesis, lumbar region 12/20/2017   UTI (urinary tract infection) 10/22/2016   BPPV (benign paroxysmal positional vertigo), left 09/07/2016   Advanced care planning/counseling discussion 12/16/2014   Carotid stenosis, asymptomatic 12/16/2014   Pterygium of both eyes 05/06/2014  History of radial keratotomy 03/13/2014   Medicare annual wellness visit, subsequent 12/12/2013   CKD (chronic kidney disease) stage 3, GFR 30-59 ml/min (HCC) 12/12/2013   DDD (degenerative disc disease), lumbar    Hepatic steatosis    Osteopenia    HTN (hypertension)    HLD (hyperlipidemia)    LPRD (laryngopharyngeal reflux disease)    Insomnia    IBS (irritable bowel syndrome)    Osteoarthritis    Abdominal bloating 09/09/2013      THERAPY DIAG:  Abnormal posture  Difficulty in walking, not elsewhere classified  Muscle weakness (generalized)  Radiculopathy, lumbar region  PCP: Ria Bush, MD   REFERRING PROVIDER: Ria Bush, MD   REFERRING DIAG: G57.03 (ICD-10-CM) - Piriformis syndrome of both sides    Rationale for Evaluation and  Treatment Rehabilitation   ONSET DATE:    SUBJECTIVE:                                                                                                                                                                                            SUBJECTIVE STATEMENT: [name] notes little pain on days where she is not as active or when she gets an opportunity to sit and take frequent breaks.  She has pain with prolonged standing and walking.    PERTINENT HISTORY:  OA, stage 3 chronic renal disease, severe DDD L4-5, HLD, HTN, previous low back surgery in 1995, chronic venous insufficiency, HOH, dizziness/BPPV, pedal edema, carotid stenosis    PAIN:  Are you having pain? Yes: NPRS scale: 1-5/10 this week Pain location: B butt cheeks and L leg to the toes Pain description: Steady ache that increases with prolonged WB Aggravating factors: Standing and walking Relieving factors: Sitting and taking a break     PRECAUTIONS: Other: Previous back surgery so flexion and flexion with rotation are discouraged   WEIGHT BEARING RESTRICTIONS No   FALLS:  Has patient fallen in last 6 months? No   LIVING ENVIRONMENT: Lives with: lives with their family Lives in: House/apartment Stairs:  Uses a handrail Has following equipment at home: None   OCCUPATION: Retired   PLOF: Independent   PATIENT GOALS Return to walking normally     OBJECTIVE:    DIAGNOSTIC FINDINGS:  Lumbar vertebral body height are preserved without evidence of fracture. Grade 1 anterolisthesis of L4 on L5. No spondylolysis identified. Severe intervertebral disc space narrowing at L4-L5 and moderate narrowing at L5-S1. Facet arthropathy.   PATIENT SURVEYS:  FOTO 52, Goal 64 in 11 visits   SCREENING FOR RED FLAGS: Bowel or bladder incontinence: No Spinal tumors: No Cauda equina syndrome: No Compression fracture:  No Abdominal aneurysm: No   COGNITION:           Overall cognitive status: Within functional limits for tasks  assessed                          SENSATION: Lilyth notes occasional L sciatica to the toes   MUSCLE LENGTH: Hamstrings: Right 35 deg; Left 35 deg   POSTURE: rounded shoulders, forward head, decreased lumbar lordosis, and flexed trunk      LUMBAR ROM:    Active  A/PROM  eval  Flexion    Extension 5  Right lateral flexion    Left lateral flexion    Right rotation    Left rotation     (Blank rows = not tested)   LOWER EXTREMITY ROM:      Passive  Right eval Left eval  Hip flexion 95 95  Hip extension      Hip abduction      Hip adduction      Hip internal rotation 15 15  Hip external rotation 23 12  Knee flexion      Knee extension      Ankle dorsiflexion      Ankle plantarflexion      Ankle inversion      Ankle eversion       (Blank rows = not tested)   LOWER EXTREMITY MMT:     MMT Right eval Left eval  Spine strength test Ayshia was unable to assume postures required for spine strength testing        GAIT: Distance walked: 100+ feet Assistive device utilized: None Level of assistance: Complete Independence Comments: Forward lean at the trunk and pain increases with increased standing and walking       TODAY'S TREATMENT  10/22/2021 Lumbar extension AROM (limited range, hips forward) 10X 3 seconds Shoulder blade pinches 10X 5 seconds Figure 4 stretch 4X 20 seconds B Hamstrings stretch (other leg straight) 4X 20 seconds B Clams (lie R, lift L) 2 sets of 10 with 3 second hold and slow eccentrics Prone alternating hip extensions 2 sets of 10 for 3 seconds (forehead on forearms, toes pulled back) Attempted hip hike in doorway but too much weakness   Functional Activities: Review log roll, mechanics with yard work and Sit to stand slow eccentrics 5X for activities involving getting low while avoiding spine flexion   10/20/21 Rt lateral trunk shift correction supine with bridge X 10 holding 3 seconds Double knee to chest stretch 10 sec X 10 Ab set  isometric bilat shoulder extension into red ball in hooklying 5 sec X10 Ab set isometric hip flexion into red ball in hooklying 5 sec X 5 bilat Hamstrings stretch (other leg straight) 3X 30 seconds B Mod thomas stretch with left leg off EOB using strap 30 sec X 3 on left Seated row machine 15# 3X10 Leg press machine DL 75# 2X10, then left leg only 37# 2X10  Lumbar extension AROM (limited range, hips forward) 10X 3 seconds  Manual therapy: left leg long axis distraction    10/15/21 Nu Step L5 X 6 min seat #4 UE/LE Rt lateral trunk shift X 10 holding 3 seconds Lumbar extension AROM (limited range, hips forward) 10X 3 seconds Heel and toe raises with TAC X 10 each Shoulder blade pinches 10X 5 seconds Figure 4 stretch 3X 20 seconds B Hamstrings stretch (other leg straight) 3X 20 seconds B Mod thomas stretch with left leg off  EOB 3X60 sec  Manual therapy: left leg long axis distraction and manual ITB stetching     PATIENT EDUCATION:  Education details: See above Person educated: Patient Education method: Explanation, Demonstration, Tactile cues, Verbal cues, and Handouts Education comprehension: verbalized understanding, returned demonstration, verbal cues required, tactile cues required, and needs further education     HOME EXERCISE PROGRAM: Access Code: [email or web address] URL: [email or web address] Date: 10/22/2021 Prepared by: Vista Mink  Exercises - Supine Figure 4 Piriformis Stretch  - 2-3 x daily - 7 x weekly - 1 sets - 5 reps - 20 seconds hold - Modified Thomas Stretch  - 2-3 x daily - 1-7 x weekly - 1 sets - 5 reps - 20 seconds hold - Sit to Stand with Armchair  - 2 x daily - 7 x weekly - 1 sets - 5 reps - Standing Hip Hiking  - 2 x daily - 1-7 x weekly - 2 sets - 5 reps - 3 seconds hold - Yoga Bridge  - 2 x daily - 1-7 x weekly - 1 sets - 10 reps - 5 seconds hold - Clamshell  - 1 x daily - 7 x weekly - 2-3 sets - 10 reps - 5 seconds hold - Prone Hip Extension  -  1 x daily - 7 x weekly - 2-3 sets - 10 reps - 3 seconds hold - Supine Hamstring Stretch  - 2-3 x daily - 7 x weekly - 1 sets - 5 reps - 20 seconds hold    ASSESSMENT:   CLINICAL IMPRESSION: Lilia appears to be making progress with her lateral shift work as only a mild shift was noted today.  Progressions to strength, body mechanics practical work and correcting her current HEP were today's focus.  Of particular interest are her hip abductors and low back weakness which is consistent with her poor WB endurance.  Continue to appropriately progress low back and hip abductors strength to meet pain and functional goals.     OBJECTIVE IMPAIRMENTS Abnormal gait, decreased activity tolerance, decreased endurance, decreased knowledge of condition, difficulty walking, decreased ROM, decreased strength, decreased safety awareness, impaired perceived functional ability, increased muscle spasms, impaired flexibility, improper body mechanics, postural dysfunction, and pain.    ACTIVITY LIMITATIONS bending, standing, sleeping, stairs, bed mobility, and locomotion level   PARTICIPATION LIMITATIONS: community activity and yard work   PERSONAL FACTORS OA, stage 3 chronic renal disease, severe DDD L4-5, HLD, HTN, previous low back surgery in 1995, chronic venous insufficiency, HOH, dizziness/BPPV, pedal edema, carotid stenosis  are also affecting patient's functional outcome.    REHAB POTENTIAL: Good   CLINICAL DECISION MAKING: Stable/uncomplicated   EVALUATION COMPLEXITY: Low     GOALS: Goals reviewed with patient? Yes   SHORT TERM GOALS: Target date: 11/10/2021   Improve lumbar extension to a pain free 10 degrees Baseline: Painful 5 degrees Goal status: INITIAL   2.  Ahmira will be able to demonstrate correct log roll, use a lumbar roll correctly and demonstrate correct golfer's and diagonal squat lift techniques Baseline: Started at evaluation Goal status: On Going 10/22/2021   3.  Jenan will  report no L sciatica Baseline: L sciatica to the toes Goal status: Ion Going 10/22/2021     LONG TERM GOALS: Target date: 12/08/2021   Improve FOTO to 64 Baseline: 52 Goal status: INITIAL   2.  Peggi will report low back and B gluteal pain consistently 0-3/10 on the Numeric Pain Rating Scale Baseline: 1-5/10 Goal status: INITIAL   3.  Improve B LE flexibility for: hip flexors 105; hamstrings 45; hip ER 40 degrees Baseline: 95; 35; 12/23 respectively Goal status: INITIAL   4.  Tylia will demonstrate improved spine strength by the prone spine strength test and FOTO scores Baseline: See FOTO and unable to assume test positions for the prone spine strength test Goal status: INITIAL   5.  Vika will be independent with her HEP at DC Baseline: Started 10/13/2021 Goal status: INITIAL     PLAN: PT FREQUENCY: 1-2x/week   PT DURATION: 8 weeks   PLANNED INTERVENTIONS: Therapeutic exercises, Therapeutic activity, Gait training, Patient/Family education, Joint mobilization, Stair training, Dry Needling, Electrical stimulation, Cryotherapy, Traction, and Manual therapy.   PLAN FOR NEXT SESSION: Continue low back and hip abductors strengthening while avoiding flexion to improve pain, sciatica and WB endurance.    Farley Ly, PT, MPT 10/22/2021, 12:05 PM

## 2021-10-28 ENCOUNTER — Ambulatory Visit (INDEPENDENT_AMBULATORY_CARE_PROVIDER_SITE_OTHER): Payer: Medicare Other | Admitting: Rehabilitative and Restorative Service Providers"

## 2021-10-28 ENCOUNTER — Encounter: Payer: Self-pay | Admitting: Rehabilitative and Restorative Service Providers"

## 2021-10-28 DIAGNOSIS — M6281 Muscle weakness (generalized): Secondary | ICD-10-CM | POA: Diagnosis not present

## 2021-10-28 DIAGNOSIS — R293 Abnormal posture: Secondary | ICD-10-CM

## 2021-10-28 DIAGNOSIS — M5416 Radiculopathy, lumbar region: Secondary | ICD-10-CM | POA: Diagnosis not present

## 2021-10-28 DIAGNOSIS — R262 Difficulty in walking, not elsewhere classified: Secondary | ICD-10-CM | POA: Diagnosis not present

## 2021-10-28 NOTE — Therapy (Signed)
OUTPATIENT PHYSICAL THERAPY TREATMENT NOTE   Patient Name: Carrie Lucas MRN: [id number] DOB:Mar 03, 1941, 81 y.o., female Today's Date: 10/28/2021   END OF SESSION:   PT End of Session - 10/28/21 1530     Visit Number 5    Number of Visits 12    Progress Note Due on Visit 10    PT Start Time 7846    PT Stop Time 1431    PT Time Calculation (min) 49 min    Activity Tolerance Patient tolerated treatment well;No increased pain    Behavior During Therapy WFL for tasks assessed/performed             Past Medical History:  Diagnosis Date   Arthritis    R middle finger, chronic arthralgias   BCC (basal cell carcinoma of skin) 06/2015, 06/2017   supraclavicular, nasal bridge, R lower back - [name]   Carotid stenosis, asymptomatic 12/16/2014   Mild bilateral 1-39%, consider rpt [place] 1 yr (11/2014)    Chronic renal disease, stage III ([place])    Colon polyp    DDD (degenerative disc disease), lumbar 2014   severe facet degeneration L4/5, mod spinal setnosis, mild foraminal stenosis   Dysplastic nevus 08/04/2021   To mid upper back, with severe atypia, rec re-excision ([name]) 06/2021   Headache    Hepatic steatosis 2012   mild by CT   History of melanoma 2003   face (Dr Luetta Nutting)   HLD (hyperlipidemia)    HTN (hypertension)    IBS (irritable bowel syndrome)    Insomnia    LPRD (laryngopharyngeal reflux disease)    [facility])   Osteopenia 2011   DEXA T -1.6 hip, -1.3 forearm (12/2015)   Sialadenitis 05/2013   acute [facility])   SK (seborrheic keratosis) 06/2020   L lateral upper back ([name])   Past Surgical History:  Procedure Laterality Date   ABI  06/2010   WNL   BACK SURGERY  1995   lumbar   BILATERAL OOPHORECTOMY  2007   ovarian cysts   BLADDER SUSPENSION     midurethral sling for SUI   CARPAL TUNNEL RELEASE Bilateral 2000   COLONOSCOPY  _0 TAx1, diverticulosis, rpt 5 yrs ([facility])   COLONOSCOPY  03/2016   WNL ([facility])   DEXA  2011   mild osteopenia (T -1.2 R  [place])   ESI  2014   L L4 transforaminal   ESOPHAGOGASTRODUODENOSCOPY  12/24/2010   reflux esophagitis   MOHS SURGERY  2016   BCC   RADIAL KERATOTOMY Left    TONSILLECTOMY  Childhood   VAGINAL HYSTERECTOMY     bladder drop   Patient Active Problem List   Diagnosis Date Noted   Piriformis syndrome of both sides 10/05/2021   Dysplastic nevus 08/04/2021   Hypercalcemia 08/01/2021   Positive ANA (antinuclear antibody) 03/05/2021   Erosive osteoarthritis of both hands 01/20/2021   Skin rash 07/16/2020   Chronic venous insufficiency of lower extremity 07/01/2020   Paresthesia of left lower extremity 07/01/2020   Varicose veins of leg with pain, right 07/01/2020   Lymphocytosis 01/07/2020   Bilateral hearing loss 01/07/2020   Dizziness 07/23/2019   Vision problem 07/05/2019   Pedal edema 09/11/2018   Renal cyst, acquired, right 07/08/2018   Spondylolisthesis, lumbar region 12/20/2017   UTI (urinary tract infection) 10/22/2016   BPPV (benign paroxysmal positional vertigo), left 09/07/2016   Advanced care planning/counseling discussion 12/16/2014   Carotid stenosis, asymptomatic 12/16/2014   Pterygium of both eyes  05/06/2014   History of radial keratotomy 03/13/2014   Medicare annual wellness visit, subsequent 12/12/2013   CKD (chronic kidney disease) stage 3, GFR 30-59 ml/min (HCC) 12/12/2013   DDD (degenerative disc disease), lumbar    Hepatic steatosis    Osteopenia    HTN (hypertension)    HLD (hyperlipidemia)    LPRD (laryngopharyngeal reflux disease)    Insomnia    IBS (irritable bowel syndrome)    Osteoarthritis    Abdominal bloating 09/09/2013      THERAPY DIAG:  Abnormal posture  Difficulty in walking, not elsewhere classified  Muscle weakness (generalized)  Radiculopathy, lumbar region  PCP: Ria Bush, MD   REFERRING PROVIDER: Ria Bush, MD   REFERRING DIAG: G57.03 (ICD-10-CM) - Piriformis syndrome of both sides    Rationale for  Evaluation and Treatment Rehabilitation   ONSET DATE:    SUBJECTIVE:                                                                                                                                                                                            SUBJECTIVE STATEMENT: [name] notes pain is a low level constant.. She has little pain on days where she is not as active or when she gets an opportunity to sit and take frequent breaks.  She has more pain with prolonged standing and walking.    PERTINENT HISTORY:  OA, stage 3 chronic renal disease, severe DDD L4-5, HLD, HTN, previous low back surgery in 1995, chronic venous insufficiency, HOH, dizziness/BPPV, pedal edema, carotid stenosis    PAIN:  Are you having pain? Yes: NPRS scale: 1-5/10 this week Pain location: B butt cheeks and L leg to the toes Pain description: Steady ache that increases with prolonged WB Aggravating factors: Standing and walking Relieving factors: Sitting and taking a break     PRECAUTIONS: Other: Previous back surgery so flexion and flexion with rotation are discouraged   WEIGHT BEARING RESTRICTIONS No   FALLS:  Has patient fallen in last 6 months? No   LIVING ENVIRONMENT: Lives with: lives with their family Lives in: House/apartment Stairs:  Uses a handrail Has following equipment at home: None   OCCUPATION: Retired   PLOF: Independent   PATIENT GOALS Return to walking normally     OBJECTIVE:    DIAGNOSTIC FINDINGS:  Lumbar vertebral body height are preserved without evidence of fracture. Grade 1 anterolisthesis of L4 on L5. No spondylolysis identified. Severe intervertebral disc space narrowing at L4-L5 and moderate narrowing at L5-S1. Facet arthropathy.   PATIENT SURVEYS:  FOTO 52, Goal 64 in 11 visits   SCREENING FOR RED FLAGS: Bowel or  bladder incontinence: No Spinal tumors: No Cauda equina syndrome: No Compression fracture: No Abdominal aneurysm: No   COGNITION:            Overall cognitive status: Within functional limits for tasks assessed                          SENSATION: Genisis notes occasional L sciatica to the toes   MUSCLE LENGTH: 10/28/2021 Hamstrings: Right 40 deg; Left 40 deg 10/13/2021 Hamstrings: Right 35 deg; Left 35 deg   POSTURE: rounded shoulders, forward head, decreased lumbar lordosis, and flexed trunk      LUMBAR ROM:    Active  A/PROM  10/13/2021 AROM 10/28/2021  Flexion     Extension 5 5  Right lateral flexion     Left lateral flexion     Right rotation     Left rotation      (Blank rows = not tested)   LOWER EXTREMITY ROM:      Passive  Right 10/13/2021 Left 10/13/2021 L/R 10/28/2021  Hip flexion 95 95 105/105  Hip extension       Hip abduction       Hip adduction       Hip internal rotation 15 15 20/20  Hip external rotation 23 12 30/30  Knee flexion       Knee extension       Ankle dorsiflexion       Ankle plantarflexion       Ankle inversion       Ankle eversion        (Blank rows = not tested)   LOWER EXTREMITY MMT:     MMT  Eval 10/28/2021  Spine strength test Azaiah was unable to assume postures required for spine strength testing Able to assume test posture but unable to hold for any measurable length of time       GAIT: Distance walked: 100+ feet Assistive device utilized: None Level of assistance: Complete Independence Comments: Forward lean at the trunk and pain increases with increased standing and walking       TODAY'S TREATMENT  10/28/2021 Lumbar extension AROM (limited range, hips forward) 10X 3 seconds Shoulder blade pinches 10X 5 seconds Figure 4 stretch 4X 20 seconds B Hamstrings stretch (other leg straight) 4X 20 seconds B Clams (lie R, lift L) 2 sets of 10 with 3 second hold and slow eccentrics with blue resistance band Prone alternating hip extensions 2 sets of 10 for 3 seconds (forehead on forearms, toes pulled back) Side lie hip abduction 2 sets of 10 B (1/4 turn to stomach)     10/22/2021 Lumbar extension AROM (limited range, hips forward) 10X 3 seconds Shoulder blade pinches 10X 5 seconds Figure 4 stretch 4X 20 seconds B Hamstrings stretch (other leg straight) 4X 20 seconds B Clams (lie R, lift L) 2 sets of 10 with 3 second hold and slow eccentrics Prone alternating hip extensions 2 sets of 10 for 3 seconds (forehead on forearms, toes pulled back) Attempted hip hike in doorway but too much weakness   Functional Activities: Review log roll, mechanics with yard work and Sit to stand slow eccentrics 5X for activities involving getting low while avoiding spine flexion   10/20/21 Rt lateral trunk shift correction supine with bridge X 10 holding 3 seconds Double knee to chest stretch 10 sec X 10 Ab set isometric bilat shoulder extension into red ball in hooklying 5 sec X10 Ab set isometric hip  flexion into red ball in hooklying 5 sec X 5 bilat Hamstrings stretch (other leg straight) 3X 30 seconds B Mod thomas stretch with left leg off EOB using strap 30 sec X 3 on left Seated row machine 15# 3X10 Leg press machine DL 75# 2X10, then left leg only 37# 2X10  Lumbar extension AROM (limited range, hips forward) 10X 3 seconds  Manual therapy: left leg long axis distraction     PATIENT EDUCATION:  Education details: See above Person educated: Patient Education method: Explanation, Demonstration, Tactile cues, Verbal cues, and Handouts Education comprehension: verbalized understanding, returned demonstration, verbal cues required, tactile cues required, and needs further education     HOME EXERCISE PROGRAM: Access Code: [email or web address] URL: [email or web address] Date: 10/28/2021 Prepared by: Vista Mink  Exercises - Supine Figure 4 Piriformis Stretch  - 2-3 x daily - 7 x weekly - 1 sets - 5 reps - 20 seconds hold - Modified Thomas Stretch  - 2-3 x daily - 1 x weekly - 1 sets - 5 reps - 20 seconds hold - Sit to Stand with Armchair  - 2 x daily - 7 x  weekly - 1 sets - 5 reps - Standing Hip Hiking  - 2 x daily - 1 x weekly - 2 sets - 5 reps - 3 seconds hold - Yoga Bridge  - 2 x daily - 1 x weekly - 1 sets - 10 reps - 5 seconds hold - Clamshell  - 1 x daily - 7 x weekly - 2-3 sets - 10 reps - 5 seconds hold - Prone Hip Extension  - 1 x daily - 7 x weekly - 2-3 sets - 10 reps - 3 seconds hold - Supine Hamstring Stretch  - 2-3 x daily - 7 x weekly - 1 sets - 5 reps - 20 seconds hold - Sidelying Hip Abduction  - 1 x daily - 7 x weekly - 2 sets - 10 reps - 5 seconds hold   ASSESSMENT:   CLINICAL IMPRESSION: Ayan is making early objective progress with her hip AROM.  This is consistent with her reports of less pain at night as compared to evaluation.  Strength is still poor and she will benefit from strength progressions added today (resisted clams and side lie hip abduction) along with continued strength and functional work to meet LTGs.     OBJECTIVE IMPAIRMENTS Abnormal gait, decreased activity tolerance, decreased endurance, decreased knowledge of condition, difficulty walking, decreased ROM, decreased strength, decreased safety awareness, impaired perceived functional ability, increased muscle spasms, impaired flexibility, improper body mechanics, postural dysfunction, and pain.    ACTIVITY LIMITATIONS bending, standing, sleeping, stairs, bed mobility, and locomotion level   PARTICIPATION LIMITATIONS: community activity and yard work   PERSONAL FACTORS OA, stage 3 chronic renal disease, severe DDD L4-5, HLD, HTN, previous low back surgery in 1995, chronic venous insufficiency, HOH, dizziness/BPPV, pedal edema, carotid stenosis  are also affecting patient's functional outcome.    REHAB POTENTIAL: Good   CLINICAL DECISION MAKING: Stable/uncomplicated   EVALUATION COMPLEXITY: Low     GOALS: Goals reviewed with patient? Yes   SHORT TERM GOALS: Target date: 11/10/2021   Improve lumbar extension to a pain free 10 degrees Baseline:  Painful 5 degrees Goal status: On Going 10/28/2021 (No pain but still 5 degrees)   2.  Cyntia will be able to demonstrate correct log roll, use a lumbar roll correctly and demonstrate correct golfer's and diagonal squat lift techniques Baseline: Started at evaluation Goal status: On Going  10/28/2021   3.  Kayte will report no L sciatica Baseline: L sciatica to the toes Goal status: [place] Going 10/28/2021     LONG TERM GOALS: Target date: 12/08/2021   Improve FOTO to 64 Baseline: 52 Goal status: INITIAL   2.  Mirel will report low back and B gluteal pain consistently 0-3/10 on the Numeric Pain Rating Scale Baseline: 1-5/10 Goal status: On Going 10/28/2021   3.  Improve B LE flexibility for: hip flexors 105; hamstrings 45; hip ER 40 degrees Baseline: 95; 35; 12/23 respectively Goal status: Partially Met 10/28/2021   4.  Dionicia will demonstrate improved spine strength by the prone spine strength test and FOTO scores Baseline: See FOTO and unable to assume test positions for the prone spine strength test Goal status: Ion Going 10/28/2021   5.  Parrie will be independent with her HEP at DC Baseline: Started 10/13/2021 Goal status: INITIAL     PLAN: PT FREQUENCY: 1-2x/week   PT DURATION: 8 weeks   PLANNED INTERVENTIONS: Therapeutic exercises, Therapeutic activity, Gait training, Patient/Family education, Joint mobilization, Stair training, Dry Needling, Electrical stimulation, Cryotherapy, Traction, and Manual therapy.   PLAN FOR NEXT SESSION: Continue low back and hip abductors strengthening progressions while avoiding flexion and excessive extension to improve pain, sciatica and WB endurance.    Farley Ly, PT, MPT 10/28/2021, 3:36 PM

## 2021-10-30 ENCOUNTER — Ambulatory Visit (INDEPENDENT_AMBULATORY_CARE_PROVIDER_SITE_OTHER): Payer: Medicare Other | Admitting: Rehabilitative and Restorative Service Providers"

## 2021-10-30 ENCOUNTER — Encounter: Payer: Self-pay | Admitting: Rehabilitative and Restorative Service Providers"

## 2021-10-30 DIAGNOSIS — R293 Abnormal posture: Secondary | ICD-10-CM

## 2021-10-30 DIAGNOSIS — M6281 Muscle weakness (generalized): Secondary | ICD-10-CM

## 2021-10-30 DIAGNOSIS — R262 Difficulty in walking, not elsewhere classified: Secondary | ICD-10-CM

## 2021-10-30 DIAGNOSIS — M5416 Radiculopathy, lumbar region: Secondary | ICD-10-CM | POA: Diagnosis not present

## 2021-10-30 NOTE — Therapy (Signed)
OUTPATIENT PHYSICAL THERAPY TREATMENT NOTE   Patient Name: Carrie Lucas MRN: [id number] DOB:[birth year], 81 y.o., female Today's Date: 10/30/2021   END OF SESSION:   PT End of Session - 10/30/21 1154     Visit Number 6    Number of Visits 12    Progress Note Due on Visit 10    PT Start Time 5400    PT Stop Time 1229    PT Time Calculation (min) 44 min    Activity Tolerance Patient tolerated treatment well;No increased pain    Behavior During Therapy WFL for tasks assessed/performed              Past Medical History:  Diagnosis Date   Arthritis    R middle finger, chronic arthralgias   BCC (basal cell carcinoma of skin) 06/2015, 06/2017   supraclavicular, nasal bridge, R lower back - [name]   Carotid stenosis, asymptomatic 12/16/2014   Mild bilateral 1-39%, consider rpt [place] 1 yr (11/2014)    Chronic renal disease, stage III ([place])    Colon polyp    DDD (degenerative disc disease), lumbar 2014   severe facet degeneration L4/5, mod spinal setnosis, mild foraminal stenosis   Dysplastic nevus 08/04/2021   To mid upper back, with severe atypia, rec re-excision ([name]) 06/2021   Headache    Hepatic steatosis 2012   mild by CT   History of melanoma 2003   face (Dr Luetta Nutting)   HLD (hyperlipidemia)    HTN (hypertension)    IBS (irritable bowel syndrome)    Insomnia    LPRD (laryngopharyngeal reflux disease)    [facility])   Osteopenia 2011   DEXA T -1.6 hip, -1.3 forearm (12/2015)   Sialadenitis 05/2013   acute [facility])   SK (seborrheic keratosis) 06/2020   L lateral upper back ([name])   Past Surgical History:  Procedure Laterality Date   ABI  06/2010   WNL   BACK SURGERY  1995   lumbar   BILATERAL OOPHORECTOMY  2007   ovarian cysts   BLADDER SUSPENSION     midurethral sling for SUI   CARPAL TUNNEL RELEASE Bilateral 2000   COLONOSCOPY  8 30 12    TAx1, diverticulosis, rpt 5 yrs ([place])   COLONOSCOPY  03/2016   WNL ([place])   DEXA  2011   mild osteopenia (T -1.2  R femur)   ESI  2014   L L4 transforaminal   ESOPHAGOGASTRODUODENOSCOPY  12/24/2010   reflux esophagitis   MOHS SURGERY  2016   BCC   RADIAL KERATOTOMY Left    TONSILLECTOMY  Childhood   VAGINAL HYSTERECTOMY     bladder drop   Patient Active Problem List   Diagnosis Date Noted   Piriformis syndrome of both sides 10/05/2021   Dysplastic nevus 08/04/2021   Hypercalcemia 08/01/2021   Positive ANA (antinuclear antibody) 03/05/2021   Erosive osteoarthritis of both hands 01/20/2021   Skin rash 07/16/2020   Chronic venous insufficiency of lower extremity 07/01/2020   Paresthesia of left lower extremity 07/01/2020   Varicose veins of leg with pain, right 07/01/2020   Lymphocytosis 01/07/2020   Bilateral hearing loss 01/07/2020   Dizziness 07/23/2019   Vision problem 07/05/2019   Pedal edema 09/11/2018   Renal cyst, acquired, right 07/08/2018   Spondylolisthesis, lumbar region 12/20/2017   UTI (urinary tract infection) 10/22/2016   BPPV (benign paroxysmal positional vertigo), left 09/07/2016   Advanced care planning/counseling discussion 12/16/2014   Carotid stenosis, asymptomatic 12/16/2014   Pterygium of both  eyes 05/06/2014   History of radial keratotomy 03/13/2014   Medicare annual wellness visit, subsequent 12/12/2013   CKD (chronic kidney disease) stage 3, GFR 30-59 ml/min (HCC) 12/12/2013   DDD (degenerative disc disease), lumbar    Hepatic steatosis    Osteopenia    HTN (hypertension)    HLD (hyperlipidemia)    LPRD (laryngopharyngeal reflux disease)    Insomnia    IBS (irritable bowel syndrome)    Osteoarthritis    Abdominal bloating 09/09/2013      THERAPY DIAG:  Abnormal posture  Difficulty in walking, not elsewhere classified  Muscle weakness (generalized)  Radiculopathy, lumbar region  PCP: Ria Bush, MD   REFERRING PROVIDER: Ria Bush, MD   REFERRING DIAG: G57.03 (ICD-10-CM) - Piriformis syndrome of both sides    Rationale for  Evaluation and Treatment Rehabilitation   ONSET DATE:    SUBJECTIVE:                                                                                                                                                                                            SUBJECTIVE STATEMENT: Carrie Lucas is frustrated with her poor standing and walking endurance (due to gluteal pain).  He reports and demonstrates a good understanding of her HEP.  She reports inconsistency with her body mechanics including too much flexion.    PERTINENT HISTORY:  OA, stage 3 chronic renal disease, severe DDD L4-5, HLD, HTN, previous low back surgery in 1995, chronic venous insufficiency, HOH, dizziness/BPPV, pedal edema, carotid stenosis    PAIN:  Are you having pain? Yes: NPRS scale: 1-5/10 this week Pain location: B butt cheeks and L leg to the toes Pain description: Steady ache that increases with prolonged WB Aggravating factors: Standing and walking Relieving factors: Sitting and taking a break     PRECAUTIONS: Other: Previous back surgery so flexion and flexion with rotation are discouraged   WEIGHT BEARING RESTRICTIONS No   FALLS:  Has patient fallen in last 6 months? No   LIVING ENVIRONMENT: Lives with: lives with their family Lives in: House/apartment Stairs:  Uses a handrail Has following equipment at home: None   OCCUPATION: Retired   PLOF: Independent   PATIENT GOALS Return to walking normally     OBJECTIVE:    DIAGNOSTIC FINDINGS:  Lumbar vertebral body height are preserved without evidence of fracture. Grade 1 anterolisthesis of L4 on L5. No spondylolysis identified. Severe intervertebral disc space narrowing at L4-L5 and moderate narrowing at L5-S1. Facet arthropathy.   PATIENT SURVEYS:  FOTO 52, Goal 64 in 11 visits   SCREENING FOR RED FLAGS: Bowel or bladder incontinence: No Spinal  tumors: No Cauda equina syndrome: No Compression fracture: No Abdominal aneurysm: No   COGNITION:            Overall cognitive status: Within functional limits for tasks assessed                          SENSATION: Carrie Lucas notes occasional L sciatica to the toes   MUSCLE LENGTH: 10/28/2021 Hamstrings: Right 40 deg; Left 40 deg 10/13/2021 Hamstrings: Right 35 deg; Left 35 deg   POSTURE: rounded shoulders, forward head, decreased lumbar lordosis, and flexed trunk      LUMBAR ROM:    Active  A/PROM  10/13/2021 AROM 10/28/2021  Flexion     Extension 5 5  Right lateral flexion     Left lateral flexion     Right rotation     Left rotation      (Blank rows = not tested)   LOWER EXTREMITY ROM:      Passive  Right 10/13/2021 Left 10/13/2021 L/R 10/28/2021  Hip flexion 95 95 105/105  Hip extension       Hip abduction       Hip adduction       Hip internal rotation 15 15 20/20  Hip external rotation 23 12 30/30  Knee flexion       Knee extension       Ankle dorsiflexion       Ankle plantarflexion       Ankle inversion       Ankle eversion        (Blank rows = not tested)   LOWER EXTREMITY MMT:     MMT  Eval 10/28/2021  Spine strength test Carrie Lucas was unable to assume postures required for spine strength testing Able to assume test posture but unable to hold for any measurable length of time       GAIT: Distance walked: 100+ feet Assistive device utilized: None Level of assistance: Complete Independence Comments: Forward lean at the trunk and pain increases with increased standing and walking       TODAY'S TREATMENT  10/30/2021 Lumbar extension AROM (limited range, hips forward) 10X 3 seconds Shoulder blade pinches 10X 5 seconds Figure 4 stretch 4X 20 seconds B Hamstrings stretch (other leg straight) 4X 20 seconds B Clams (lie R, lift L) 2 sets of 10 with 3 second hold and slow eccentrics with blue resistance band Prone alternating hip extensions 2 sets of 10 for 3 seconds (forehead on forearms, toes pulled back) Side lie hip abduction 2 sets of 10 B (1/4 turn to  stomach)  Functional Activities: Golfer's lift practical for activities in the kitchen and that are low, close to the floor   10/28/2021 Lumbar extension AROM (limited range, hips forward) 10X 3 seconds Shoulder blade pinches 10X 5 seconds Figure 4 stretch 4X 20 seconds B Hamstrings stretch (other leg straight) 4X 20 seconds B Clams (lie R, lift L) 2 sets of 10 with 3 second hold and slow eccentrics with blue resistance band Prone alternating hip extensions 2 sets of 10 for 3 seconds (forehead on forearms, toes pulled back) Side lie hip abduction 2 sets of 10 B (1/4 turn to stomach)    10/22/2021 Lumbar extension AROM (limited range, hips forward) 10X 3 seconds Shoulder blade pinches 10X 5 seconds Figure 4 stretch 4X 20 seconds B Hamstrings stretch (other leg straight) 4X 20 seconds B Clams (lie R, lift L) 2 sets of 10 with 3 second  hold and slow eccentrics Prone alternating hip extensions 2 sets of 10 for 3 seconds (forehead on forearms, toes pulled back) Attempted hip hike in doorway but too much weakness   Functional Activities: Review log roll, mechanics with yard work and Sit to stand slow eccentrics 5X for activities involving getting low while avoiding spine flexion  +  PATIENT EDUCATION:  Education details: See above Person educated: Patient Education method: Consulting civil engineer, Demonstration, Tactile cues, Verbal cues, and Handouts Education comprehension: verbalized understanding, returned demonstration, verbal cues required, tactile cues required, and needs further education     HOME EXERCISE PROGRAM: Access Code: [email or web address] URL: [email or web address] Date: 10/28/2021 Prepared by: Vista Mink  Exercises - Supine Figure 4 Piriformis Stretch  - 2-3 x daily - 7 x weekly - 1 sets - 5 reps - 20 seconds hold - Modified Thomas Stretch  - 2-3 x daily - 1 x weekly - 1 sets - 5 reps - 20 seconds hold - Sit to Stand with Armchair  - 2 x daily - 7 x weekly - 1 sets - 5  reps - Standing Hip Hiking  - 2 x daily - 1 x weekly - 2 sets - 5 reps - 3 seconds hold - Yoga Bridge  - 2 x daily - 1 x weekly - 1 sets - 10 reps - 5 seconds hold - Clamshell  - 1 x daily - 7 x weekly - 2-3 sets - 10 reps - 5 seconds hold - Prone Hip Extension  - 1 x daily - 7 x weekly - 2-3 sets - 10 reps - 3 seconds hold - Supine Hamstring Stretch  - 2-3 x daily - 7 x weekly - 1 sets - 5 reps - 20 seconds hold - Sidelying Hip Abduction  - 1 x daily - 7 x weekly - 2 sets - 10 reps - 5 seconds hold   ASSESSMENT:   CLINICAL IMPRESSION: Carrie Lucas is frustrated with her poor standing and walking endurance, although she reports less pain at night as compared to evaluation.  Lumbar extensors, hip abductors and quadratus lumborum strength is still poor and she will benefit from continued, appropriate strength progressions and functional work to meet LTGs.     OBJECTIVE IMPAIRMENTS Abnormal gait, decreased activity tolerance, decreased endurance, decreased knowledge of condition, difficulty walking, decreased ROM, decreased strength, decreased safety awareness, impaired perceived functional ability, increased muscle spasms, impaired flexibility, improper body mechanics, postural dysfunction, and pain.    ACTIVITY LIMITATIONS bending, standing, sleeping, stairs, bed mobility, and locomotion level   PARTICIPATION LIMITATIONS: community activity and yard work   PERSONAL FACTORS OA, stage 3 chronic renal disease, severe DDD L4-5, HLD, HTN, previous low back surgery in 1995, chronic venous insufficiency, HOH, dizziness/BPPV, pedal edema, carotid stenosis  are also affecting patient's functional outcome.    REHAB POTENTIAL: Good   CLINICAL DECISION MAKING: Stable/uncomplicated   EVALUATION COMPLEXITY: Low     GOALS: Goals reviewed with patient? Yes   SHORT TERM GOALS: Target date: 11/10/2021   Improve lumbar extension to a pain free 10 degrees Baseline: Painful 5 degrees Goal status: On Going  10/28/2021 (No pain but still 5 degrees)   2.  Carrie Lucas will be able to demonstrate correct log roll, use a lumbar roll correctly and demonstrate correct golfer's and diagonal squat lift techniques Baseline: Started at evaluation Goal status: On Going 10/28/2021   3.  Carrie Lucas will report no L sciatica Baseline: L sciatica to the toes Goal status: [place] Going 10/28/2021  LONG TERM GOALS: Target date: 12/08/2021   Improve FOTO to 64 Baseline: 52 Goal status: INITIAL   2.  Carrie Lucas will report low back and B gluteal pain consistently 0-3/10 on the Numeric Pain Rating Scale Baseline: 1-5/10 Goal status: On Going 10/28/2021   3.  Improve B LE flexibility for: hip flexors 105; hamstrings 45; hip ER 40 degrees Baseline: 95; 35; 12/23 respectively Goal status: Partially Met 10/28/2021   4.  Carrie Lucas will demonstrate improved spine strength by the prone spine strength test and FOTO scores Baseline: See FOTO and unable to assume test positions for the prone spine strength test Goal status: Ion Going 10/28/2021   5.  Carrie Lucas will be independent with her HEP at DC Baseline: Started 10/13/2021 Goal status: INITIAL     PLAN: PT FREQUENCY: 1-2x/week   PT DURATION: 8 weeks   PLANNED INTERVENTIONS: Therapeutic exercises, Therapeutic activity, Gait training, Patient/Family education, Joint mobilization, Stair training, Dry Needling, Electrical stimulation, Cryotherapy, Traction, and Manual therapy.   PLAN FOR NEXT SESSION: Continue low back and hip abductors strengthening progressions while avoiding flexion and excessive extension to improve pain, sciatica and WB endurance.  Continued practical work.    Farley Ly, PT, MPT 10/30/2021, 1:00 PM

## 2021-11-04 ENCOUNTER — Encounter: Payer: Medicare Other | Admitting: Rehabilitative and Restorative Service Providers"

## 2021-11-04 ENCOUNTER — Ambulatory Visit (INDEPENDENT_AMBULATORY_CARE_PROVIDER_SITE_OTHER): Payer: Medicare Other | Admitting: Rehabilitative and Restorative Service Providers"

## 2021-11-04 ENCOUNTER — Encounter: Payer: Self-pay | Admitting: Rehabilitative and Restorative Service Providers"

## 2021-11-04 DIAGNOSIS — M5416 Radiculopathy, lumbar region: Secondary | ICD-10-CM

## 2021-11-04 DIAGNOSIS — M6281 Muscle weakness (generalized): Secondary | ICD-10-CM

## 2021-11-04 DIAGNOSIS — R293 Abnormal posture: Secondary | ICD-10-CM | POA: Diagnosis not present

## 2021-11-04 DIAGNOSIS — R262 Difficulty in walking, not elsewhere classified: Secondary | ICD-10-CM

## 2021-11-04 NOTE — Therapy (Signed)
OUTPATIENT PHYSICAL THERAPY TREATMENT NOTE   Patient Name: Carrie Lucas MRN: [id number] DOB:1941-01-08, 81 y.o., female Today's Date: 11/04/2021   END OF SESSION:   PT End of Session - 11/04/21 0852     Visit Number 7    Number of Visits 12    Progress Note Due on Visit 10    PT Start Time 3536    PT Stop Time 0928    PT Time Calculation (min) 44 min    Activity Tolerance Patient tolerated treatment well;No increased pain    Behavior During Therapy WFL for tasks assessed/performed               Past Medical History:  Diagnosis Date   Arthritis    R middle finger, chronic arthralgias   BCC (basal cell carcinoma of skin) 06/2015, 06/2017   supraclavicular, nasal bridge, R lower back - [name]   Carotid stenosis, asymptomatic 12/16/2014   Mild bilateral 1-39%, consider rpt [place] 1 yr (11/2014)    Chronic renal disease, stage III ([place])    Colon polyp    DDD (degenerative disc disease), lumbar 2014   severe facet degeneration L4/5, mod spinal setnosis, mild foraminal stenosis   Dysplastic nevus 08/04/2021   To mid upper back, with severe atypia, rec re-excision ([name]) 06/2021   Headache    Hepatic steatosis 2012   mild by CT   History of melanoma 2003   face (Dr Luetta Nutting)   HLD (hyperlipidemia)    HTN (hypertension)    IBS (irritable bowel syndrome)    Insomnia    LPRD (laryngopharyngeal reflux disease)    [facility])   Osteopenia 2011   DEXA T -1.6 hip, -1.3 forearm (12/2015)   Sialadenitis 05/2013   acute [facility])   SK (seborrheic keratosis) 06/2020   L lateral upper back ([name])   Past Surgical History:  Procedure Laterality Date   ABI  06/2010   WNL   BACK SURGERY  1995   lumbar   BILATERAL OOPHORECTOMY  2007   ovarian cysts   BLADDER SUSPENSION     midurethral sling for SUI   CARPAL TUNNEL RELEASE Bilateral 2000   COLONOSCOPY  _0 TAx1, diverticulosis, rpt 5 yrs ([facility])   COLONOSCOPY  03/2016   WNL ([facility])   DEXA  2011   mild osteopenia (T  -1.2 R femur)   ESI  2014   L L4 transforaminal   ESOPHAGOGASTRODUODENOSCOPY  12/24/2010   reflux esophagitis   MOHS SURGERY  2016   BCC   RADIAL KERATOTOMY Left    TONSILLECTOMY  Childhood   VAGINAL HYSTERECTOMY     bladder drop   Patient Active Problem List   Diagnosis Date Noted   Piriformis syndrome of both sides 10/05/2021   Dysplastic nevus 08/04/2021   Hypercalcemia 08/01/2021   Positive ANA (antinuclear antibody) 03/05/2021   Erosive osteoarthritis of both hands 01/20/2021   Skin rash 07/16/2020   Chronic venous insufficiency of lower extremity 07/01/2020   Paresthesia of left lower extremity 07/01/2020   Varicose veins of leg with pain, right 07/01/2020   Lymphocytosis 01/07/2020   Bilateral hearing loss 01/07/2020   Dizziness 07/23/2019   Vision problem 07/05/2019   Pedal edema 09/11/2018   Renal cyst, acquired, right 07/08/2018   Spondylolisthesis, lumbar region 12/20/2017   UTI (urinary tract infection) 10/22/2016   BPPV (benign paroxysmal positional vertigo), left 09/07/2016   Advanced care planning/counseling discussion 12/16/2014   Carotid stenosis, asymptomatic 12/16/2014   Pterygium of  both eyes 05/06/2014   History of radial keratotomy 03/13/2014   Medicare annual wellness visit, subsequent 12/12/2013   CKD (chronic kidney disease) stage 3, GFR 30-59 ml/min (HCC) 12/12/2013   DDD (degenerative disc disease), lumbar    Hepatic steatosis    Osteopenia    HTN (hypertension)    HLD (hyperlipidemia)    LPRD (laryngopharyngeal reflux disease)    Insomnia    IBS (irritable bowel syndrome)    Osteoarthritis    Abdominal bloating 09/09/2013      THERAPY DIAG:  Abnormal posture  Difficulty in walking, not elsewhere classified  Muscle weakness (generalized)  Radiculopathy, lumbar region  PCP: Ria Bush, MD   REFERRING PROVIDER: Ria Bush, MD   REFERRING DIAG: G57.03 (ICD-10-CM) - Piriformis syndrome of both sides    Rationale for  Evaluation and Treatment Rehabilitation   ONSET DATE:    SUBJECTIVE:                                                                                                                                                                                            SUBJECTIVE STATEMENT: Carrie Lucas reports better endurance with standing during a singing rehearsal last night.  She was very sore this morning.  She reports improved consistency with her body mechanics including avoiding too much flexion.    PERTINENT HISTORY:  OA, stage 3 chronic renal disease, severe DDD L4-5, HLD, HTN, previous low back surgery in 1995, chronic venous insufficiency, HOH, dizziness/BPPV, pedal edema, carotid stenosis    PAIN:  Are you having pain? Yes: NPRS scale: 1-5/10 this week Pain location: B butt cheeks and L leg to the toes Pain description: Steady ache that increases with prolonged WB Aggravating factors: Standing and walking Relieving factors: Sitting and taking a break     PRECAUTIONS: Other: Previous back surgery so flexion and flexion with rotation are discouraged   WEIGHT BEARING RESTRICTIONS No   FALLS:  Has patient fallen in last 6 months? No   LIVING ENVIRONMENT: Lives with: lives with their family Lives in: House/apartment Stairs:  Uses a handrail Has following equipment at home: None   OCCUPATION: Retired   PLOF: Independent   PATIENT GOALS Return to walking normally     OBJECTIVE:    DIAGNOSTIC FINDINGS:  Lumbar vertebral body height are preserved without evidence of fracture. Grade 1 anterolisthesis of L4 on L5. No spondylolysis identified. Severe intervertebral disc space narrowing at L4-L5 and moderate narrowing at L5-S1. Facet arthropathy.   PATIENT SURVEYS:  FOTO 52, Goal 64 in 11 visits   SCREENING FOR RED FLAGS: Bowel or bladder incontinence: No Spinal tumors: No Cauda  equina syndrome: No Compression fracture: No Abdominal aneurysm: No   COGNITION:           Overall  cognitive status: Within functional limits for tasks assessed                          SENSATION: Carrie Lucas notes occasional L sciatica to the toes   MUSCLE LENGTH: 10/28/2021 Hamstrings: Right 40 deg; Left 40 deg 10/13/2021 Hamstrings: Right 35 deg; Left 35 deg   POSTURE: rounded shoulders, forward head, decreased lumbar lordosis, and flexed trunk      LUMBAR ROM:    Active  A/PROM  10/13/2021 AROM 10/28/2021  Flexion     Extension 5 5  Right lateral flexion     Left lateral flexion     Right rotation     Left rotation      (Blank rows = not tested)   LOWER EXTREMITY ROM:      Passive  Right 10/13/2021 Left 10/13/2021 L/R 10/28/2021  Hip flexion 95 95 105/105  Hip extension       Hip abduction       Hip adduction       Hip internal rotation 15 15 20/20  Hip external rotation 23 12 30/30  Knee flexion       Knee extension       Ankle dorsiflexion       Ankle plantarflexion       Ankle inversion       Ankle eversion        (Blank rows = not tested)   LOWER EXTREMITY MMT:     MMT  Eval 10/28/2021  Spine strength test Marga was unable to assume postures required for spine strength testing Able to assume test posture but unable to hold for any measurable length of time       GAIT: Distance walked: 100+ feet Assistive device utilized: None Level of assistance: Complete Independence Comments: Forward lean at the trunk and pain increases with increased standing and walking       TODAY'S TREATMENT  11/04/2021 Lumbar extension AROM (limited range, hips forward) 10X 3 seconds Shoulder blade pinches 10X 5 seconds Figure 4 stretch 4X 20 seconds B Hamstrings stretch (other leg straight) 4X 20 seconds B Clams (lie R, lift L) 2 sets of 10 with 3 second hold and slow eccentrics with blue resistance band Prone alternating hip extensions 2 sets of 10 for 5 seconds (forehead on forearms, toes pulled back) Side lie hip abduction 2 sets of 10 B (1/4 turn to stomach) Hip hike in parallel  bars 2 sets of 10 for 3 seconds  Functional Activities: Golfer's lift practical for activities in the kitchen and that are low, close to the floor, review of disc pressures in various positions with particular emphasis on   Ice low back supine 10 minutes post-exercise (un timed)   10/30/2021 Lumbar extension AROM (limited range, hips forward) 10X 3 seconds Shoulder blade pinches 10X 5 seconds Figure 4 stretch 4X 20 seconds B Hamstrings stretch (other leg straight) 4X 20 seconds B Clams (lie R, lift L) 2 sets of 10 with 3 second hold and slow eccentrics with blue resistance band Prone alternating hip extensions 2 sets of 10 for 3 seconds (forehead on forearms, toes pulled back) Side lie hip abduction 2 sets of 10 B (1/4 turn to stomach)  Functional Activities: Golfer's lift practical for activities in the kitchen and that are low, close to  the floor   10/28/2021 Lumbar extension AROM (limited range, hips forward) 10X 3 seconds Shoulder blade pinches 10X 5 seconds Figure 4 stretch 4X 20 seconds B Hamstrings stretch (other leg straight) 4X 20 seconds B Clams (lie R, lift L) 2 sets of 10 with 3 second hold and slow eccentrics with blue resistance band Prone alternating hip extensions 2 sets of 10 for 3 seconds (forehead on forearms, toes pulled back) Side lie hip abduction 2 sets of 10 B (1/4 turn to stomach)   +  PATIENT EDUCATION:  Education details: See above Person educated: Patient Education method: Explanation, Demonstration, Tactile cues, Verbal cues, and Handouts Education comprehension: verbalized understanding, returned demonstration, verbal cues required, tactile cues required, and needs further education     HOME EXERCISE PROGRAM: Access Code: [email or web address] URL: [email or web address] Date: 11/04/2021 Prepared by: Vista Mink  Exercises - Supine Figure 4 Piriformis Stretch  - 2-3 x daily - 7 x weekly - 1 sets - 5 reps - 20 seconds hold - Modified Thomas  Stretch  - 2-3 x daily - 1 x weekly - 1 sets - 5 reps - 20 seconds hold - Sit to Stand with Armchair  - 2 x daily - 7 x weekly - 1 sets - 5 reps - Standing Hip Hiking  - 2 x daily - 1 x weekly - 2 sets - 10 reps - 3 seconds hold - Yoga Bridge  - 2 x daily - 1 x weekly - 1 sets - 10 reps - 5 seconds hold - Clamshell  - 1 x daily - 7 x weekly - 2-3 sets - 10 reps - 5 seconds hold - Prone Hip Extension  - 1 x daily - 7 x weekly - 2-3 sets - 10 reps - 3 seconds hold - Supine Hamstring Stretch  - 2-3 x daily - 7 x weekly - 1 sets - 5 reps - 20 seconds hold - Sidelying Hip Abduction  - 1 x daily - 7 x weekly - 2 sets - 10 reps - 5 seconds hold   ASSESSMENT:   CLINICAL IMPRESSION: Keirsten notes progress at night and with standing endurance during her singing practice.  She was feeling some delayed pain this morning after her practice yesterday, suggesting core strength still needs to improve.  We also re-introduced quadratus lumborum strengthening and will modify this PRN based on symptoms next visit.  Lumbar extensors, hip abductors and quadratus lumborum strength is still in need of appropriate strength progressions along with functional work to meet LTGs.     OBJECTIVE IMPAIRMENTS Abnormal gait, decreased activity tolerance, decreased endurance, decreased knowledge of condition, difficulty walking, decreased ROM, decreased strength, decreased safety awareness, impaired perceived functional ability, increased muscle spasms, impaired flexibility, improper body mechanics, postural dysfunction, and pain.    ACTIVITY LIMITATIONS bending, standing, sleeping, stairs, bed mobility, and locomotion level   PARTICIPATION LIMITATIONS: community activity and yard work   PERSONAL FACTORS OA, stage 3 chronic renal disease, severe DDD L4-5, HLD, HTN, previous low back surgery in 1995, chronic venous insufficiency, HOH, dizziness/BPPV, pedal edema, carotid stenosis  are also affecting patient's functional outcome.     REHAB POTENTIAL: Good   CLINICAL DECISION MAKING: Stable/uncomplicated   EVALUATION COMPLEXITY: Low     GOALS: Goals reviewed with patient? Yes   SHORT TERM GOALS: Target date: 11/10/2021   Improve lumbar extension to a pain free 10 degrees Baseline: Painful 5 degrees Goal status: On Going 10/28/2021 (No pain but still 5 degrees)   2.  Amie will be able to demonstrate correct log roll, use a lumbar roll correctly and demonstrate correct golfer's and diagonal squat lift techniques Baseline: Started at evaluation Goal status: Improving 11/04/2021   3.  Mallary will report no L sciatica Baseline: L sciatica to the toes Goal status: On Going 11/04/2021     LONG TERM GOALS: Target date: 12/08/2021   Improve FOTO to 64 Baseline: 52 Goal status: INITIAL   2.  Johnica will report low back and B gluteal pain consistently 0-3/10 on the Numeric Pain Rating Scale Baseline: 1-5/10 Goal status: On Going 11/04/2021   3.  Improve B LE flexibility for: hip flexors 105; hamstrings 45; hip ER 40 degrees Baseline: 95; 35; 12/23 respectively Goal status: Partially Met 10/28/2021   4.  Zanovia will demonstrate improved spine strength by the prone spine strength test and FOTO scores Baseline: See FOTO and unable to assume test positions for the prone spine strength test Goal status: On Going 10/28/2021   5.  Geana will be independent with her HEP at DC Baseline: Started 10/13/2021 Goal status: INITIAL     PLAN: PT FREQUENCY: 1-2x/week   PT DURATION: 8 weeks   PLANNED INTERVENTIONS: Therapeutic exercises, Therapeutic activity, Gait training, Patient/Family education, Joint mobilization, Stair training, Dry Needling, Electrical stimulation, Cryotherapy, Traction, and Manual therapy.   PLAN FOR NEXT SESSION: Continue low back and hip abductors strengthening progressions with increased emphasis on quadratus lumborum while avoiding flexion and excessive extension to improve pain, sciatica and WB  endurance.  Continued practical work.  FOTO next visit.    Farley Ly, PT, MPT 11/04/2021, 9:29 AM

## 2021-11-06 ENCOUNTER — Encounter: Payer: Self-pay | Admitting: Rehabilitative and Restorative Service Providers"

## 2021-11-06 ENCOUNTER — Ambulatory Visit (INDEPENDENT_AMBULATORY_CARE_PROVIDER_SITE_OTHER): Payer: Medicare Other | Admitting: Rehabilitative and Restorative Service Providers"

## 2021-11-06 DIAGNOSIS — M6281 Muscle weakness (generalized): Secondary | ICD-10-CM | POA: Diagnosis not present

## 2021-11-06 DIAGNOSIS — M5416 Radiculopathy, lumbar region: Secondary | ICD-10-CM

## 2021-11-06 DIAGNOSIS — R262 Difficulty in walking, not elsewhere classified: Secondary | ICD-10-CM | POA: Diagnosis not present

## 2021-11-06 DIAGNOSIS — R293 Abnormal posture: Secondary | ICD-10-CM | POA: Diagnosis not present

## 2021-11-06 NOTE — Therapy (Signed)
OUTPATIENT PHYSICAL THERAPY TREATMENT NOTE   Patient Name: Carrie Lucas MRN: [id number] DOB:1940-11-06, 81 y.o., female Today's Date: 11/06/2021   END OF SESSION:   PT End of Session - 11/06/21 1143     Visit Number 8    Number of Visits 12    Progress Note Due on Visit 10    PT Start Time 1660    PT Stop Time 1222    PT Time Calculation (min) 40 min    Activity Tolerance Patient tolerated treatment well;No increased pain    Behavior During Therapy WFL for tasks assessed/performed                Past Medical History:  Diagnosis Date   Arthritis    R middle finger, chronic arthralgias   BCC (basal cell carcinoma of skin) 06/2015, 06/2017   supraclavicular, nasal bridge, R lower back - [name]   Carotid stenosis, asymptomatic 12/16/2014   Mild bilateral 1-39%, consider rpt [place] 1 yr (11/2014)    Chronic renal disease, stage III ([facility])    Colon polyp    DDD (degenerative disc disease), lumbar 2014   severe facet degeneration L4/5, mod spinal setnosis, mild foraminal stenosis   Dysplastic nevus 08/04/2021   To mid upper back, with severe atypia, rec re-excision ([name]) 06/2021   Headache    Hepatic steatosis 2012   mild by CT   History of melanoma 2003   face (Dr Luetta Nutting)   HLD (hyperlipidemia)    HTN (hypertension)    IBS (irritable bowel syndrome)    Insomnia    LPRD (laryngopharyngeal reflux disease)    [facility])   Osteopenia 2011   DEXA T -1.6 hip, -1.3 forearm (12/2015)   Sialadenitis 05/2013   acute [facility])   SK (seborrheic keratosis) 06/2020   L lateral upper back ([name])   Past Surgical History:  Procedure Laterality Date   ABI  06/2010   WNL   BACK SURGERY  1995   lumbar   BILATERAL OOPHORECTOMY  2007   ovarian cysts   BLADDER SUSPENSION     midurethral sling for SUI   CARPAL TUNNEL RELEASE Bilateral 2000   COLONOSCOPY  _0 TAx1, diverticulosis, rpt 5 yrs ([facility])   COLONOSCOPY  03/2016   WNL ([facility])   DEXA  2011   mild osteopenia (T  -1.2 R femur)   ESI  2014   L L4 transforaminal   ESOPHAGOGASTRODUODENOSCOPY  12/24/2010   reflux esophagitis   MOHS SURGERY  2016   BCC   RADIAL KERATOTOMY Left    TONSILLECTOMY  Childhood   VAGINAL HYSTERECTOMY     bladder drop   Patient Active Problem List   Diagnosis Date Noted   Piriformis syndrome of both sides 10/05/2021   Dysplastic nevus 08/04/2021   Hypercalcemia 08/01/2021   Positive ANA (antinuclear antibody) 03/05/2021   Erosive osteoarthritis of both hands 01/20/2021   Skin rash 07/16/2020   Chronic venous insufficiency of lower extremity 07/01/2020   Paresthesia of left lower extremity 07/01/2020   Varicose veins of leg with pain, right 07/01/2020   Lymphocytosis 01/07/2020   Bilateral hearing loss 01/07/2020   Dizziness 07/23/2019   Vision problem 07/05/2019   Pedal edema 09/11/2018   Renal cyst, acquired, right 07/08/2018   Spondylolisthesis, lumbar region 12/20/2017   UTI (urinary tract infection) 10/22/2016   BPPV (benign paroxysmal positional vertigo), left 09/07/2016   Advanced care planning/counseling discussion 12/16/2014   Carotid stenosis, asymptomatic 12/16/2014   Pterygium  of both eyes 05/06/2014   History of radial keratotomy 03/13/2014   Medicare annual wellness visit, subsequent 12/12/2013   CKD (chronic kidney disease) stage 3, GFR 30-59 ml/min (HCC) 12/12/2013   DDD (degenerative disc disease), lumbar    Hepatic steatosis    Osteopenia    HTN (hypertension)    HLD (hyperlipidemia)    LPRD (laryngopharyngeal reflux disease)    Insomnia    IBS (irritable bowel syndrome)    Osteoarthritis    Abdominal bloating 09/09/2013      THERAPY DIAG:  Abnormal posture  Difficulty in walking, not elsewhere classified  Muscle weakness (generalized)  Radiculopathy, lumbar region  PCP: Javier Gutierrez, MD   REFERRING PROVIDER: Javier Gutierrez, MD   REFERRING DIAG: G57.03 (ICD-10-CM) - Piriformis syndrome of both sides    Rationale for  Evaluation and Treatment Rehabilitation   ONSET DATE:    SUBJECTIVE:                                                                                                                                                                                            SUBJECTIVE STATEMENT: Esta reports soreness is better when she uses ice.  She is well aware of her L sided weakness (hip abductors, quadratus lumborum) and she reports improved consistency with her body mechanics including avoiding too much flexion.    PERTINENT HISTORY:  OA, stage 3 chronic renal disease, severe DDD L4-5, HLD, HTN, previous low back surgery in 1995, chronic venous insufficiency, HOH, dizziness/BPPV, pedal edema, carotid stenosis    PAIN:  Are you having pain? Yes: NPRS scale: 1-5/10 this week Pain location: B butt cheeks and L leg to the toes Pain description: Steady ache that increases with prolonged WB Aggravating factors: Standing and walking Relieving factors: Sitting and taking a break     PRECAUTIONS: Other: Previous back surgery so flexion and flexion with rotation are discouraged   WEIGHT BEARING RESTRICTIONS No   FALLS:  Has patient fallen in last 6 months? No   LIVING ENVIRONMENT: Lives with: lives with their family Lives in: House/apartment Stairs:  Uses a handrail Has following equipment at home: None   OCCUPATION: Retired   PLOF: Independent   PATIENT GOALS Return to walking normally     OBJECTIVE:    DIAGNOSTIC FINDINGS:  Lumbar vertebral body height are preserved without evidence of fracture. Grade 1 anterolisthesis of L4 on L5. No spondylolysis identified. Severe intervertebral disc space narrowing at L4-L5 and moderate narrowing at L5-S1. Facet arthropathy.   PATIENT SURVEYS:  FOTO 52, Goal 64 in 11 visits   SCREENING FOR RED FLAGS: Bowel or bladder incontinence:   No Spinal tumors: No Cauda equina syndrome: No Compression fracture: No Abdominal aneurysm: No   COGNITION:            Overall cognitive status: Within functional limits for tasks assessed                          SENSATION: Rebekka notes occasional L sciatica to the toes   MUSCLE LENGTH: 10/28/2021 Hamstrings: Right 40 deg; Left 40 deg 10/13/2021 Hamstrings: Right 35 deg; Left 35 deg   POSTURE: rounded shoulders, forward head, decreased lumbar lordosis, and flexed trunk      LUMBAR ROM:    Active  A/PROM  10/13/2021 AROM 10/28/2021  Flexion     Extension 5 5  Right lateral flexion     Left lateral flexion     Right rotation     Left rotation      (Blank rows = not tested)   LOWER EXTREMITY ROM:      Passive  Right 10/13/2021 Left 10/13/2021 L/R 10/28/2021  Hip flexion 95 95 105/105  Hip extension       Hip abduction       Hip adduction       Hip internal rotation 15 15 20/20  Hip external rotation 23 12 30/30  Knee flexion       Knee extension       Ankle dorsiflexion       Ankle plantarflexion       Ankle inversion       Ankle eversion        (Blank rows = not tested)   LOWER EXTREMITY MMT:     MMT  Eval 10/28/2021  Spine strength test Kaithlyn was unable to assume postures required for spine strength testing Able to assume test posture but unable to hold for any measurable length of time       GAIT: Distance walked: 100+ feet Assistive device utilized: None Level of assistance: Complete Independence Comments: Forward lean at the trunk and pain increases with increased standing and walking       TODAY'S TREATMENT  11/06/2021 Lumbar extension AROM (limited range, hips forward) 10X 3 seconds Shoulder blade pinches 5X 5 seconds Figure 4 stretch 4X 20 seconds B Hamstrings stretch (other leg straight) 4X 20 seconds B Clams (lie R, lift L) 2 sets of 10 with 3 second hold and slow eccentrics with blue resistance band Reverse Clams (lie R, lift L) 10X 3 seconds slow eccentrics Prone alternating hip extensions 3 sets of 10 for 5 seconds (forehead on forearms, toes pulled back) Side  lie hip abduction 2 sets of 10 B (1/4 turn to stomach) Hip hike in parallel bars 2 sets of 10 for 3 seconds Bridging 10X 5 seconds Bridging with marching, attempted but hips too weak   11/04/2021 Lumbar extension AROM (limited range, hips forward) 10X 3 seconds Shoulder blade pinches 10X 5 seconds Figure 4 stretch 4X 20 seconds B Hamstrings stretch (other leg straight) 4X 20 seconds B Clams (lie R, lift L) 2 sets of 10 with 3 second hold and slow eccentrics with blue resistance band Prone alternating hip extensions 3 sets of 10 for 5 seconds (forehead on forearms, toes pulled back) Side lie hip abduction 2 sets of 10 B (1/4 turn to stomach) Hip hike in parallel bars 2 sets of 10 for 3 seconds  Functional Activities: Golfer's lift practical for activities in the kitchen and that are low, close to the  floor, review of disc pressures in various positions with particular emphasis on   Ice low back supine 10 minutes post-exercise (un timed)   10/30/2021 Lumbar extension AROM (limited range, hips forward) 10X 3 seconds Shoulder blade pinches 10X 5 seconds Figure 4 stretch 4X 20 seconds B Hamstrings stretch (other leg straight) 4X 20 seconds B Clams (lie R, lift L) 2 sets of 10 with 3 second hold and slow eccentrics with blue resistance band Prone alternating hip extensions 2 sets of 10 for 3 seconds (forehead on forearms, toes pulled back) Side lie hip abduction 2 sets of 10 B (1/4 turn to stomach)  Functional Activities: Golfer's lift practical for activities in the kitchen and that are low, close to the floor   +  PATIENT EDUCATION:  Education details: See above Person educated: Patient Education method: Explanation, Demonstration, Tactile cues, Verbal cues, and Handouts Education comprehension: verbalized understanding, returned demonstration, verbal cues required, tactile cues required, and needs further education     HOME EXERCISE PROGRAM: Access Code: [email or web address] URL:  [email or web address] Date: 11/04/2021 Prepared by: Robert Lovell  Exercises - Supine Figure 4 Piriformis Stretch  - 2-3 x daily - 7 x weekly - 1 sets - 5 reps - 20 seconds hold - Modified Thomas Stretch  - 2-3 x daily - 1 x weekly - 1 sets - 5 reps - 20 seconds hold - Sit to Stand with Armchair  - 2 x daily - 7 x weekly - 1 sets - 5 reps - Standing Hip Hiking  - 2 x daily - 1 x weekly - 2 sets - 10 reps - 3 seconds hold - Yoga Bridge  - 2 x daily - 1 x weekly - 1 sets - 10 reps - 5 seconds hold - Clamshell  - 1 x daily - 7 x weekly - 2-3 sets - 10 reps - 5 seconds hold - Prone Hip Extension  - 1 x daily - 7 x weekly - 2-3 sets - 10 reps - 3 seconds hold - Supine Hamstring Stretch  - 2-3 x daily - 7 x weekly - 1 sets - 5 reps - 20 seconds hold - Sidelying Hip Abduction  - 1 x daily - 7 x weekly - 2 sets - 10 reps - 5 seconds hold   ASSESSMENT:   CLINICAL IMPRESSION: Darcee is pleased with her improvements in pain at night and with standing endurance during her singing practice.  She is well aware of her L sided weakness with therapeutic exercises, suggesting core and L hip strength still need to improve.  We progressed hip and quadratus lumborum strengthening and will modify this PRN based on symptoms next visit.  Lumbar extensors, hip abductors and quadratus lumborum strength still need appropriate strength progressions along with functional work to meet LTGs.     OBJECTIVE IMPAIRMENTS Abnormal gait, decreased activity tolerance, decreased endurance, decreased knowledge of condition, difficulty walking, decreased ROM, decreased strength, decreased safety awareness, impaired perceived functional ability, increased muscle spasms, impaired flexibility, improper body mechanics, postural dysfunction, and pain.    ACTIVITY LIMITATIONS bending, standing, sleeping, stairs, bed mobility, and locomotion level   PARTICIPATION LIMITATIONS: community activity and yard work   PERSONAL  FACTORS OA, stage 3 chronic renal disease, severe DDD L4-5, HLD, HTN, previous low back surgery in 1995, chronic venous insufficiency, HOH, dizziness/BPPV, pedal edema, carotid stenosis  are also affecting patient's functional outcome.    REHAB POTENTIAL: Good   CLINICAL DECISION MAKING: Stable/uncomplicated   EVALUATION COMPLEXITY: Low       GOALS: Goals reviewed with patient? Yes   SHORT TERM GOALS: Target date: 11/10/2021   Improve lumbar extension to a pain free 10 degrees Baseline: Painful 5 degrees Goal status: On Going 10/28/2021 (No pain but still 5 degrees)   2.  Alexie will be able to demonstrate correct log roll, use a lumbar roll correctly and demonstrate correct golfer's and diagonal squat lift techniques Baseline: Started at evaluation Goal status: Improving 11/04/2021   3.  Alleah will report no L sciatica Baseline: L sciatica to the toes Goal status: On Going 11/04/2021     LONG TERM GOALS: Target date: 12/08/2021   Improve FOTO to 64 Baseline: 52 Goal status: INITIAL   2.  Cassi will report low back and B gluteal pain consistently 0-3/10 on the Numeric Pain Rating Scale Baseline: 1-5/10 Goal status: On Going 11/04/2021   3.  Improve B LE flexibility for: hip flexors 105; hamstrings 45; hip ER 40 degrees Baseline: 95; 35; 12/23 respectively Goal status: Partially Met 10/28/2021   4.  Selisa will demonstrate improved spine strength by the prone spine strength test and FOTO scores Baseline: See FOTO and unable to assume test positions for the prone spine strength test Goal status: On Going 10/28/2021   5.  Meeka will be independent with her HEP at DC Baseline: Started 10/13/2021 Goal status: INITIAL     PLAN: PT FREQUENCY: 1-2x/week   PT DURATION: 8 weeks   PLANNED INTERVENTIONS: Therapeutic exercises, Therapeutic activity, Gait training, Patient/Family education, Joint mobilization, Stair training, Dry Needling, Electrical stimulation, Cryotherapy, Traction, and  Manual therapy.   PLAN FOR NEXT SESSION: Continue low back and hip abductors strengthening progressions with increased emphasis on quadratus lumborum while avoiding flexion and excessive extension to improve pain, sciatica and WB endurance.  Continued practical work.  FOTO next visit.    Farley Ly, PT, MPT 11/06/2021, 12:27 PM

## 2021-11-11 ENCOUNTER — Ambulatory Visit (INDEPENDENT_AMBULATORY_CARE_PROVIDER_SITE_OTHER): Payer: Medicare Other | Admitting: Physical Therapy

## 2021-11-11 ENCOUNTER — Encounter: Payer: Self-pay | Admitting: Physical Therapy

## 2021-11-11 DIAGNOSIS — M5416 Radiculopathy, lumbar region: Secondary | ICD-10-CM

## 2021-11-11 DIAGNOSIS — R293 Abnormal posture: Secondary | ICD-10-CM | POA: Diagnosis not present

## 2021-11-11 DIAGNOSIS — R262 Difficulty in walking, not elsewhere classified: Secondary | ICD-10-CM

## 2021-11-11 DIAGNOSIS — M6281 Muscle weakness (generalized): Secondary | ICD-10-CM

## 2021-11-11 NOTE — Therapy (Signed)
OUTPATIENT PHYSICAL THERAPY TREATMENT NOTE   Patient Name: Carrie Lucas MRN: [id number] DOB:[birth year], 81 y.o., female Today's Date: 11/11/2021   END OF SESSION:   PT End of Session - 11/11/21 1144     Visit Number 9    Number of Visits 12    Progress Note Due on Visit 10    PT Start Time 4193    PT Stop Time 1225    PT Time Calculation (min) 40 min    Activity Tolerance Patient tolerated treatment well;No increased pain    Behavior During Therapy WFL for tasks assessed/performed                Past Medical History:  Diagnosis Date   Arthritis    R middle finger, chronic arthralgias   BCC (basal cell carcinoma of skin) 06/2015, 06/2017   supraclavicular, nasal bridge, R lower back - [name]   Carotid stenosis, asymptomatic 12/16/2014   Mild bilateral 1-39%, consider rpt [place] 1 yr (11/2014)    Chronic renal disease, stage III ([facility])    Colon polyp    DDD (degenerative disc disease), lumbar 2014   severe facet degeneration L4/5, mod spinal setnosis, mild foraminal stenosis   Dysplastic nevus 08/04/2021   To mid upper back, with severe atypia, rec re-excision ([name]) 06/2021   Headache    Hepatic steatosis 2012   mild by CT   History of melanoma 2003   face (Dr Luetta Nutting)   HLD (hyperlipidemia)    HTN (hypertension)    IBS (irritable bowel syndrome)    Insomnia    LPRD (laryngopharyngeal reflux disease)    [facility])   Osteopenia 2011   DEXA T -1.6 hip, -1.3 forearm (12/2015)   Sialadenitis 05/2013   acute [facility])   SK (seborrheic keratosis) 06/2020   L lateral upper back ([name])   Past Surgical History:  Procedure Laterality Date   ABI  06/2010   WNL   BACK SURGERY  1995   lumbar   BILATERAL OOPHORECTOMY  2007   ovarian cysts   BLADDER SUSPENSION     midurethral sling for SUI   CARPAL TUNNEL RELEASE Bilateral 2000   COLONOSCOPY  8 30 12    TAx1, diverticulosis, rpt 5 yrs ([facility])   COLONOSCOPY  03/2016   WNL ([facility])   DEXA  2011   mild osteopenia (T  -1.2 R femur)   ESI  2014   L L4 transforaminal   ESOPHAGOGASTRODUODENOSCOPY  12/24/2010   reflux esophagitis   MOHS SURGERY  2016   BCC   RADIAL KERATOTOMY Left    TONSILLECTOMY  Childhood   VAGINAL HYSTERECTOMY     bladder drop   Patient Active Problem List   Diagnosis Date Noted   Piriformis syndrome of both sides 10/05/2021   Dysplastic nevus 08/04/2021   Hypercalcemia 08/01/2021   Positive ANA (antinuclear antibody) 03/05/2021   Erosive osteoarthritis of both hands 01/20/2021   Skin rash 07/16/2020   Chronic venous insufficiency of lower extremity 07/01/2020   Paresthesia of left lower extremity 07/01/2020   Varicose veins of leg with pain, right 07/01/2020   Lymphocytosis 01/07/2020   Bilateral hearing loss 01/07/2020   Dizziness 07/23/2019   Vision problem 07/05/2019   Pedal edema 09/11/2018   Renal cyst, acquired, right 07/08/2018   Spondylolisthesis, lumbar region 12/20/2017   UTI (urinary tract infection) 10/22/2016   BPPV (benign paroxysmal positional vertigo), left 09/07/2016   Advanced care planning/counseling discussion 12/16/2014   Carotid stenosis, asymptomatic 12/16/2014   Pterygium  of both eyes 05/06/2014   History of radial keratotomy 03/13/2014   Medicare annual wellness visit, subsequent 12/12/2013   CKD (chronic kidney disease) stage 3, GFR 30-59 ml/min (HCC) 12/12/2013   DDD (degenerative disc disease), lumbar    Hepatic steatosis    Osteopenia    HTN (hypertension)    HLD (hyperlipidemia)    LPRD (laryngopharyngeal reflux disease)    Insomnia    IBS (irritable bowel syndrome)    Osteoarthritis    Abdominal bloating 09/09/2013      THERAPY DIAG:  Abnormal posture  Difficulty in walking, not elsewhere classified  Muscle weakness (generalized)  Radiculopathy, lumbar region  PCP: Ria Bush, MD   REFERRING PROVIDER: Ria Bush, MD   REFERRING DIAG: G57.03 (ICD-10-CM) - Piriformis syndrome of both sides    Rationale for  Evaluation and Treatment Rehabilitation   ONSET DATE:    SUBJECTIVE:                                                                                                                                                                                            SUBJECTIVE STATEMENT: Carrie Lucas reports the pain is there but not bad    PERTINENT HISTORY:  OA, stage 3 chronic renal disease, severe DDD L4-5, HLD, HTN, previous low back surgery in 1995, chronic venous insufficiency, HOH, dizziness/BPPV, pedal edema, carotid stenosis    PAIN:  Are you having pain? Yes: NPRS scale: 2/10 currently Pain location: B butt cheeks and L leg to the toes Pain description: Steady ache that increases with prolonged WB Aggravating factors: Standing and walking Relieving factors: Sitting and taking a break     PRECAUTIONS: Other: Previous back surgery so flexion and flexion with rotation are discouraged   WEIGHT BEARING RESTRICTIONS No   FALLS:  Has patient fallen in last 6 months? No   LIVING ENVIRONMENT: Lives with: lives with their family Lives in: House/apartment Stairs:  Uses a handrail Has following equipment at home: None   OCCUPATION: Retired   PLOF: Independent   PATIENT GOALS Return to walking normally     OBJECTIVE:    DIAGNOSTIC FINDINGS:  Lumbar vertebral body height are preserved without evidence of fracture. Grade 1 anterolisthesis of L4 on L5. No spondylolysis identified. Severe intervertebral disc space narrowing at L4-L5 and moderate narrowing at L5-S1. Facet arthropathy.   PATIENT SURVEYS:  FOTO 52, Goal 64 in 11 visits   SCREENING FOR RED FLAGS: Bowel or bladder incontinence: No Spinal tumors: No Cauda equina syndrome: No Compression fracture: No Abdominal aneurysm: No   COGNITION:           Overall cognitive  status: Within functional limits for tasks assessed                          SENSATION: Carrie Lucas notes occasional L sciatica to the toes   MUSCLE  LENGTH: 10/28/2021 Hamstrings: Right 40 deg; Left 40 deg 10/13/2021 Hamstrings: Right 35 deg; Left 35 deg   POSTURE: rounded shoulders, forward head, decreased lumbar lordosis, and flexed trunk      LUMBAR ROM:    Active  A/PROM  10/13/2021 AROM 10/28/2021  Flexion     Extension 5 5  Right lateral flexion     Left lateral flexion     Right rotation     Left rotation      (Blank rows = not tested)   LOWER EXTREMITY ROM:      Passive  Right 10/13/2021 Left 10/13/2021 L/R 10/28/2021  Hip flexion 95 95 105/105  Hip extension       Hip abduction       Hip adduction       Hip internal rotation 15 15 20/20  Hip external rotation 23 12 30/30  Knee flexion       Knee extension       Ankle dorsiflexion       Ankle plantarflexion       Ankle inversion       Ankle eversion        (Blank rows = not tested)   LOWER EXTREMITY MMT:     MMT  Eval 10/28/2021  Spine strength test Carrie Lucas was unable to assume postures required for spine strength testing Able to assume test posture but unable to hold for any measurable length of time       GAIT: Distance walked: 100+ feet Assistive device utilized: None Level of assistance: Complete Independence Comments: Forward lean at the trunk and pain increases with increased standing and walking       TODAY'S TREATMENT  11/11/2021 Hip hike in parallel bars 10 for 5 seconds Sidestepping with red band around ankles 3 round trips in bars Step ups onto 6 inch step X 10 bilat Lateral step ups 6 inch X 10 bilat Lumbar extension AROM (limited range, hips forward) 10X 3 seconds Shoulder blade pinches 10X 5 seconds Figure 4 stretch 4X 20 seconds B Hamstrings stretch (other leg straight) 4X 20 seconds B Clams (lie R, lift L) 2 sets of 10 with 3 second hold and slow eccentrics with blue resistance band Reverse Clams (lie R, lift L) 10X 3 seconds slow eccentrics Prone alternating hip extensions 3 sets of 10 for 5 seconds (forehead on forearms, toes pulled  back) Side lie hip abduction 2 sets of 10 B (1/4 turn to stomach) Bridging 10X 5 seconds Bridging with marching, attempted but hips too weak  11/06/2021 Lumbar extension AROM (limited range, hips forward) 10X 3 seconds Shoulder blade pinches 5X 5 seconds Figure 4 stretch 4X 20 seconds B Hamstrings stretch (other leg straight) 4X 20 seconds B Clams (lie R, lift L) 2 sets of 10 with 3 second hold and slow eccentrics with blue resistance band Reverse Clams (lie R, lift L) 10X 3 seconds slow eccentrics Prone alternating hip extensions 3 sets of 10 for 5 seconds (forehead on forearms, toes pulled back) Side lie hip abduction 2 sets of 10 B (1/4 turn to stomach) Hip hike in parallel bars 2 sets of 10 for 3 seconds Bridging 10X 5 seconds Bridging with marching, attempted but hips too weak  11/04/2021 Lumbar extension AROM (limited range, hips forward) 10X 3 seconds Shoulder blade pinches 10X 5 seconds Figure 4 stretch 4X 20 seconds B Hamstrings stretch (other leg straight) 4X 20 seconds B Clams (lie R, lift L) 2 sets of 10 with 3 second hold and slow eccentrics with blue resistance band Prone alternating hip extensions 3 sets of 10 for 5 seconds (forehead on forearms, toes pulled back) Side lie hip abduction 2 sets of 10 B (1/4 turn to stomach) Hip hike in parallel bars 2 sets of 10 for 3 seconds  Functional Activities: Golfer's lift practical for activities in the kitchen and that are low, close to the floor, review of disc pressures in various positions with particular emphasis on   Ice low back supine 10 minutes post-exercise (un timed)   10/30/2021 Lumbar extension AROM (limited range, hips forward) 10X 3 seconds Shoulder blade pinches 10X 5 seconds Figure 4 stretch 4X 20 seconds B Hamstrings stretch (other leg straight) 4X 20 seconds B Clams (lie R, lift L) 2 sets of 10 with 3 second hold and slow eccentrics with blue resistance band Prone alternating hip extensions 2 sets of 10  for 3 seconds (forehead on forearms, toes pulled back) Side lie hip abduction 2 sets of 10 B (1/4 turn to stomach)  Functional Activities: Golfer's lift practical for activities in the kitchen and that are low, close to the floor   +  PATIENT EDUCATION:  Education details: See above Person educated: Patient Education method: Explanation, Demonstration, Tactile cues, Verbal cues, and Handouts Education comprehension: verbalized understanding, returned demonstration, verbal cues required, tactile cues required, and needs further education     HOME EXERCISE PROGRAM: Access Code: [email or web address] URL: [email or web address] Date: 11/04/2021 Prepared by: Vista Mink  Exercises - Supine Figure 4 Piriformis Stretch  - 2-3 x daily - 7 x weekly - 1 sets - 5 reps - 20 seconds hold - Modified Thomas Stretch  - 2-3 x daily - 1 x weekly - 1 sets - 5 reps - 20 seconds hold - Sit to Stand with Armchair  - 2 x daily - 7 x weekly - 1 sets - 5 reps - Standing Hip Hiking  - 2 x daily - 1 x weekly - 2 sets - 10 reps - 3 seconds hold - Yoga Bridge  - 2 x daily - 1 x weekly - 1 sets - 10 reps - 5 seconds hold - Clamshell  - 1 x daily - 7 x weekly - 2-3 sets - 10 reps - 5 seconds hold - Prone Hip Extension  - 1 x daily - 7 x weekly - 2-3 sets - 10 reps - 3 seconds hold - Supine Hamstring Stretch  - 2-3 x daily - 7 x weekly - 1 sets - 5 reps - 20 seconds hold - Sidelying Hip Abduction  - 1 x daily - 7 x weekly - 2 sets - 10 reps - 5 seconds hold   ASSESSMENT:   CLINICAL IMPRESSION: Carrie Lucas continues to have good days and bad days in terms of her pain. This should continue to trend towards more good days than bad with improved strengthening of lumbar/core, and hips which we are working to progress as tolerated.      OBJECTIVE IMPAIRMENTS Abnormal gait, decreased activity tolerance, decreased endurance, decreased knowledge of condition, difficulty walking, decreased ROM, decreased strength, decreased  safety awareness, impaired perceived functional ability, increased muscle spasms, impaired flexibility, improper body mechanics, postural dysfunction, and pain.    ACTIVITY  LIMITATIONS bending, standing, sleeping, stairs, bed mobility, and locomotion level   PARTICIPATION LIMITATIONS: community activity and yard work   PERSONAL FACTORS OA, stage 3 chronic renal disease, severe DDD L4-5, HLD, HTN, previous low back surgery in 1995, chronic venous insufficiency, HOH, dizziness/BPPV, pedal edema, carotid stenosis  are also affecting patient's functional outcome.    REHAB POTENTIAL: Good   CLINICAL DECISION MAKING: Stable/uncomplicated   EVALUATION COMPLEXITY: Low     GOALS: Goals reviewed with patient? Yes   SHORT TERM GOALS: Target date: 11/10/2021   Improve lumbar extension to a pain free 10 degrees Baseline: Painful 5 degrees Goal status: On Going 10/28/2021 (No pain but still 5 degrees)   2.  Carrie Lucas will be able to demonstrate correct log roll, use a lumbar roll correctly and demonstrate correct golfer's and diagonal squat lift techniques Baseline: Started at evaluation Goal status: Improving 11/04/2021   3.  Carrie Lucas will report no L sciatica Baseline: L sciatica to the toes Goal status: On Going 11/04/2021     LONG TERM GOALS: Target date: 12/08/2021   Improve FOTO to 64 Baseline: 52 Goal status: INITIAL   2.  Carrie Lucas will report low back and B gluteal pain consistently 0-3/10 on the Numeric Pain Rating Scale Baseline: 1-5/10 Goal status: On Going 11/04/2021   3.  Improve B LE flexibility for: hip flexors 105; hamstrings 45; hip ER 40 degrees Baseline: 95; 35; 12/23 respectively Goal status: Partially Met 10/28/2021   4.  Carrie Lucas will demonstrate improved spine strength by the prone spine strength test and FOTO scores Baseline: See FOTO and unable to assume test positions for the prone spine strength test Goal status: On Going 10/28/2021   5.  Carrie Lucas will be independent with her HEP  at [place] Baseline: Started 10/13/2021 Goal status: INITIAL     PLAN: PT FREQUENCY: 1-2x/week   PT DURATION: 8 weeks   PLANNED INTERVENTIONS: Therapeutic exercises, Therapeutic activity, Gait training, Patient/Family education, Joint mobilization, Stair training, Dry Needling, Electrical stimulation, Cryotherapy, Traction, and Manual therapy.   PLAN FOR NEXT SESSION: Continue low back and hip abductors strengthening progressions with increased emphasis on quadratus lumborum while avoiding flexion and excessive extension to improve pain, sciatica and WB endurance.  Continued practical work.  FOTO next visit.    Debbe Odea, PT, DPT 11/11/2021, 11:46 AM

## 2021-11-13 ENCOUNTER — Ambulatory Visit (INDEPENDENT_AMBULATORY_CARE_PROVIDER_SITE_OTHER): Payer: Medicare Other | Admitting: Rehabilitative and Restorative Service Providers"

## 2021-11-13 ENCOUNTER — Encounter: Payer: Self-pay | Admitting: Rehabilitative and Restorative Service Providers"

## 2021-11-13 DIAGNOSIS — M5416 Radiculopathy, lumbar region: Secondary | ICD-10-CM

## 2021-11-13 DIAGNOSIS — R293 Abnormal posture: Secondary | ICD-10-CM

## 2021-11-13 DIAGNOSIS — M6281 Muscle weakness (generalized): Secondary | ICD-10-CM

## 2021-11-13 DIAGNOSIS — R262 Difficulty in walking, not elsewhere classified: Secondary | ICD-10-CM

## 2021-11-13 NOTE — Therapy (Signed)
OUTPATIENT PHYSICAL THERAPY TREATMENT/PROGRESS NOTE   Patient Name: JOBY HERSHKOWITZ MRN: [id number] DOB:07-05-1940, 81 y.o., female Today's Date: 11/13/2021   END OF SESSION:   PT End of Session - 11/13/21 1355     Visit Number 10    Number of Visits 12    Progress Note Due on Visit 14    PT Start Time 1145    PT Stop Time 1233    PT Time Calculation (min) 48 min    Activity Tolerance Patient tolerated treatment well;No increased pain    Behavior During Therapy [facility] for tasks assessed/performed            Progress Note Reporting Period 10/13/2021 to 11/13/2021  See note below for Objective Data and Assessment of Progress/Goals.      Past Medical History:  Diagnosis Date   Arthritis    R middle finger, chronic arthralgias   BCC (basal cell carcinoma of skin) 06/2015, 06/2017   supraclavicular, nasal bridge, R lower back - [name]   Carotid stenosis, asymptomatic 12/16/2014   Mild bilateral 1-39%, consider rpt [place] 1 yr (11/2014)    Chronic renal disease, stage III (HCC)    Colon polyp    DDD (degenerative disc disease), lumbar 2014   severe facet degeneration L4/5, mod spinal setnosis, mild foraminal stenosis   Dysplastic nevus 08/04/2021   To mid upper back, with severe atypia, rec re-excision ([name]) 06/2021   Headache    Hepatic steatosis 2012   mild by CT   History of melanoma 2003   face (Dr Luetta Nutting)   HLD (hyperlipidemia)    HTN (hypertension)    IBS (irritable bowel syndrome)    Insomnia    LPRD (laryngopharyngeal reflux disease)    [facility])   Osteopenia 2011   DEXA T -1.6 hip, -1.3 forearm (12/2015)   Sialadenitis 05/2013   acute [facility] Bas[facility])   SK (seborrheic keratosis) 06/2020   L lateral upper back ([name])   Past Surgical History:  Procedure Laterality Date   ABI  06/2010   WNL   BACK SURGERY  1995   lumbar   BILATERAL OOPHORECTOMY  2007   ovarian cysts   BLADDER SUSPENSION     midurethral sling for SUI   CARPAL TUNNEL RELEASE Bilateral 2000    COLONOSCOPY  8 30 12    TAx1, diverticulosis, rpt 5 yrs (Outlaw)   COLONOSCOPY  03/2016   WNL (Outlaw)   DEXA  2011   mild osteopenia (T -1.2 R femur)   ESI  2014   L L4 transforaminal   ESOPHAGOGASTRODUODENOSCOPY  12/24/2010   reflux esophagitis   MOHS SURGERY  2016   BCC   RADIAL KERATOTOMY Left    TONSILLECTOMY  Childhood   VAGINAL HYSTERECTOMY     bladder drop   Patient Active Problem List   Diagnosis Date Noted   Piriformis syndrome of both sides 10/05/2021   Dysplastic nevus 08/04/2021   Hypercalcemia 08/01/2021   Positive ANA (antinuclear antibody) 03/05/2021   Erosive osteoarthritis of both hands 01/20/2021   Skin rash 07/16/2020   Chronic venous insufficiency of lower extremity 07/01/2020   Paresthesia of left lower extremity 07/01/2020   Varicose veins of leg with pain, right 07/01/2020   Lymphocytosis 01/07/2020   Bilateral hearing loss 01/07/2020   Dizziness 07/23/2019   Vision problem 07/05/2019   Pedal edema 09/11/2018   Renal cyst, acquired, right 07/08/2018   Spondylolisthesis, lumbar region 12/20/2017   UTI (urinary tract infection) 10/22/2016   BPPV (benign paroxysmal positional  vertigo), left 09/07/2016   Advanced care planning/counseling discussion 12/16/2014   Carotid stenosis, asymptomatic 12/16/2014   Pterygium of both eyes 05/06/2014   History of radial keratotomy 03/13/2014   Medicare annual wellness visit, subsequent 12/12/2013   CKD (chronic kidney disease) stage 3, GFR 30-59 ml/min (HCC) 12/12/2013   DDD (degenerative disc disease), lumbar    Hepatic steatosis    Osteopenia    HTN (hypertension)    HLD (hyperlipidemia)    LPRD (laryngopharyngeal reflux disease)    Insomnia    IBS (irritable bowel syndrome)    Osteoarthritis    Abdominal bloating 09/09/2013     THERAPY DIAG:  Abnormal posture  Difficulty in walking, not elsewhere classified  Muscle weakness (generalized)  Radiculopathy, lumbar region  PCP: Ria Bush,  MD   REFERRING PROVIDER: Ria Bush, MD   REFERRING DIAG: G57.03 (ICD-10-CM) - Piriformis syndrome of both sides    Rationale for Evaluation and Treatment Rehabilitation   ONSET DATE:    SUBJECTIVE:                                                                                                                                                                                            SUBJECTIVE STATEMENT: Low back and gluteal pain was "as bad as it has been" yesterday.  She feels like her activities on Wednesday in PT were appropriate but maybe a bit more than she was ready for.  Overall progress has been significant, particularly pain at night which is now rare and was constant.   PERTINENT HISTORY:  OA, stage 3 chronic renal disease, severe DDD L4-5, HLD, HTN, previous low back surgery in 1995, chronic venous insufficiency, HOH, dizziness/BPPV, pedal edema, carotid stenosis    PAIN:  Are you having pain? Yes: NPRS scale: 0-6/10 this week (mostly yesterday), 0-3/10 otherwise Pain location: B butt cheeks and L leg to the toes Pain description: Steady ache that increases with prolonged WB Aggravating factors: Standing and walking Relieving factors: Sitting and taking a break     PRECAUTIONS: Other: Previous back surgery so flexion and flexion with rotation are discouraged   WEIGHT BEARING RESTRICTIONS No   FALLS:  Has patient fallen in last 6 months? No   LIVING ENVIRONMENT: Lives with: lives with their family Lives in: House/apartment Stairs:  Uses a handrail Has following equipment at home: None   OCCUPATION: Retired   PLOF: Independent   PATIENT GOALS Return to walking normally     OBJECTIVE:    DIAGNOSTIC FINDINGS:  Lumbar vertebral body height are preserved without evidence of fracture. Grade 1 anterolisthesis of L4 on L5. No spondylolysis identified.  Severe intervertebral disc space narrowing at L4-L5 and moderate narrowing at L5-S1. Facet arthropathy.    PATIENT SURVEYS:  11/13/2021 FOTO 56 (Goal 64, was 52) 10/13/2021 FOTO 52, Goal 64 in 11 visits   SCREENING FOR RED FLAGS: Bowel or bladder incontinence: No Spinal tumors: No Cauda equina syndrome: No Compression fracture: No Abdominal aneurysm: No   COGNITION:           Overall cognitive status: Within functional limits for tasks assessed                          SENSATION: Janiah notes occasional L sciatica to the toes   MUSCLE LENGTH: 11/13/2021 Hamstrings: Right 45 deg; Left 40 deg 10/28/2021 Hamstrings: Right 40 deg; Left 40 deg 10/13/2021 Hamstrings: Right 35 deg; Left 35 deg   POSTURE: rounded shoulders, forward head, decreased lumbar lordosis, and flexed trunk      LUMBAR ROM:    Active  A/PROM  10/13/2021 AROM 10/28/2021 AROM 11/13/2021  Flexion      Extension 5 5 10   Right lateral flexion      Left lateral flexion      Right rotation      Left rotation       (Blank rows = not tested)   LOWER EXTREMITY ROM:      Passive  Right 10/13/2021 Left 10/13/2021 L/R 10/28/2021 L/R 11/13/2021  Hip flexion 95 95 105/105 105/110  Hip extension        Hip abduction        Hip adduction        Hip internal rotation 15 15 20/20 20/20  Hip external rotation 23 12 30/30 25/33  Knee flexion        Knee extension        Ankle dorsiflexion        Ankle plantarflexion        Ankle inversion        Ankle eversion         (Blank rows = not tested)   LOWER EXTREMITY MMT:     MMT  Eval 10/28/2021 11/13/2021  Spine strength test Blanch Media was unable to assume postures required for spine strength testing Able to assume test posture but unable to hold for any measurable length of time  Deferred due to painful day yesterday      GAIT: Distance walked: 100+ feet Assistive device utilized: None Level of assistance: Complete Independence Comments: Forward lean at the trunk and pain increases with increased standing and walking       TODAY'S TREATMENT  11/13/2021 Lumbar extension AROM  (limited range, hips forward) 10X 3 seconds Shoulder blade pinches 5X 5 seconds Figure 4 stretch 4X 20 seconds B Hamstrings stretch (other leg straight) 4X 20 seconds B Clams (lie R, lift L) 2 sets of 10 with 3 second hold and slow eccentrics with blue resistance band Reverse Clams (lie R, lift L) 10X 3 seconds slow eccentrics *Prone alternating hip extensions 1 set of 10 for 5 seconds (forehead on forearms, toes pulled back) *Side lie hip abduction 2 sets of 10 B (1/4 turn to stomach)  *Hip hike in parallel bars 1 set of 10 for 3 seconds Bridging 10X 5 seconds  * Is HEP only (IE not performed today in the office due to reassessment time)   11/11/2021 Hip hike in parallel bars 10 for 5 seconds Sidestepping with red band around ankles 3 round trips in bars Step ups  onto 6 inch step X 10 bilat Lateral step ups 6 inch X 10 bilat Lumbar extension AROM (limited range, hips forward) 10X 3 seconds Shoulder blade pinches 10X 5 seconds Figure 4 stretch 4X 20 seconds B Hamstrings stretch (other leg straight) 4X 20 seconds B Clams (lie R, lift L) 2 sets of 10 with 3 second hold and slow eccentrics with blue resistance band Reverse Clams (lie R, lift L) 10X 3 seconds slow eccentrics Prone alternating hip extensions 3 sets of 10 for 5 seconds (forehead on forearms, toes pulled back) Side lie hip abduction 2 sets of 10 B (1/4 turn to stomach) Bridging 10X 5 seconds Bridging with marching, attempted but hips too weak   11/06/2021 Lumbar extension AROM (limited range, hips forward) 10X 3 seconds Shoulder blade pinches 5X 5 seconds Figure 4 stretch 4X 20 seconds B Hamstrings stretch (other leg straight) 4X 20 seconds B Clams (lie R, lift L) 2 sets of 10 with 3 second hold and slow eccentrics with blue resistance band Reverse Clams (lie R, lift L) 10X 3 seconds slow eccentrics Prone alternating hip extensions 3 sets of 10 for 5 seconds (forehead on forearms, toes pulled back) Side lie hip  abduction 2 sets of 10 B (1/4 turn to stomach) Hip hike in parallel bars 2 sets of 10 for 3 seconds Bridging 10X 5 seconds Bridging with marching, attempted but hips too weak   +  PATIENT EDUCATION:  Education details: See above Person educated: Patient Education method: Explanation, Demonstration, Tactile cues, Verbal cues, and Handouts Education comprehension: verbalized understanding, returned demonstration, verbal cues required, tactile cues required, and needs further education     HOME EXERCISE PROGRAM: Access Code: [email or web address] URL: [email or web address] Date: 11/04/2021 Prepared by: Vista Mink  Exercises - Supine Figure 4 Piriformis Stretch  - 2-3 x daily - 7 x weekly - 1 sets - 5 reps - 20 seconds hold - Modified Thomas Stretch  - 2-3 x daily - 1 x weekly - 1 sets - 5 reps - 20 seconds hold - Sit to Stand with Armchair  - 2 x daily - 7 x weekly - 1 sets - 5 reps - Standing Hip Hiking  - 2 x daily - 1 x weekly - 2 sets - 10 reps - 3 seconds hold - Yoga Bridge  - 2 x daily - 1 x weekly - 1 sets - 10 reps - 5 seconds hold - Clamshell  - 1 x daily - 7 x weekly - 2-3 sets - 10 reps - 5 seconds hold - Prone Hip Extension  - 1 x daily - 7 x weekly - 2-3 sets - 10 reps - 3 seconds hold - Supine Hamstring Stretch  - 2-3 x daily - 7 x weekly - 1 sets - 5 reps - 20 seconds hold - Sidelying Hip Abduction  - 1 x daily - 7 x weekly - 2 sets - 10 reps - 5 seconds hold   ASSESSMENT:   CLINICAL IMPRESSION: [place] has made great progress with her comfort at night, body mechanics and overall function.  I believe her FOTO score would be better today if it weren't for some overuse Wednesday resulting in a painful day Thursday.  With a bit more strength work (progressions needed), Jayliah should meet LTGs and be ready for independent rehabilitation.     OBJECTIVE IMPAIRMENTS Abnormal gait, decreased activity tolerance, decreased endurance, decreased knowledge of condition,  difficulty walking, decreased ROM, decreased strength, decreased safety awareness, impaired perceived functional ability,  increased muscle spasms, impaired flexibility, improper body mechanics, postural dysfunction, and pain.    ACTIVITY LIMITATIONS bending, standing, sleeping, stairs, bed mobility, and locomotion level   PARTICIPATION LIMITATIONS: community activity and yard work   PERSONAL FACTORS OA, stage 3 chronic renal disease, severe DDD L4-5, HLD, HTN, previous low back surgery in 1995, chronic venous insufficiency, HOH, dizziness/BPPV, pedal edema, carotid stenosis  are also affecting patient's functional outcome.    REHAB POTENTIAL: Good   CLINICAL DECISION MAKING: Stable/uncomplicated   EVALUATION COMPLEXITY: Low     GOALS: Goals reviewed with patient? Yes   SHORT TERM GOALS: Target date: 11/10/2021   Improve lumbar extension to a pain free 10 degrees Baseline: Painful 5 degrees Goal status: Met 11/13/2021   2.  Patricie will be able to demonstrate correct log roll, use a lumbar roll correctly and demonstrate correct golfer's and diagonal squat lift techniques Baseline: Started at evaluation Goal status: Improving 11/13/2021   3.  Nazyia will report no L sciatica Baseline: L sciatica to the toes Goal status: On Going 11/13/2021     LONG TERM GOALS: Target date: 12/08/2021   Improve FOTO to 64 Baseline: 52 Goal status: Improving (56 on 11/13/2021)   2.  Melinda will report low back and B gluteal pain consistently 0-3/10 on the Numeric Pain Rating Scale Baseline: 1-5/10 Goal status: On Going 11/13/2021   3.  Improve B LE flexibility for: hip flexors 105; hamstrings 45; hip ER 40 degrees Baseline: 95; 35; 12/23 respectively Goal status: Partially Met 11/13/2021   4.  Emmylou will demonstrate improved spine strength by the prone spine strength test and FOTO scores Baseline: See FOTO and unable to assume test positions for the prone spine strength test Goal status: On Going  11/13/2021   5.  Mozelle will be independent with her HEP at DC Baseline: Started 10/13/2021 Goal status: INITIAL     PLAN: PT FREQUENCY: 1x/week   PT DURATION: 4 weeks   PLANNED INTERVENTIONS: Therapeutic exercises, Therapeutic activity, Gait training, Patient/Family education, Joint mobilization, Stair training, Dry Needling, Electrical stimulation, Cryotherapy, Traction, and Manual therapy.   PLAN FOR NEXT SESSION: Continue low back and hip abductors strengthening progressions while avoiding overuse, flexion and excessive extension to improve pain, sciatica and WB endurance.  Continued practical work.    Farley Ly, PT, MPT 11/13/2021, 2:04 PM

## 2021-11-16 DIAGNOSIS — N1832 Chronic kidney disease, stage 3b: Secondary | ICD-10-CM | POA: Diagnosis not present

## 2021-11-24 DIAGNOSIS — N1832 Chronic kidney disease, stage 3b: Secondary | ICD-10-CM | POA: Diagnosis not present

## 2021-11-24 DIAGNOSIS — K589 Irritable bowel syndrome without diarrhea: Secondary | ICD-10-CM | POA: Diagnosis not present

## 2021-11-24 DIAGNOSIS — H8110 Benign paroxysmal vertigo, unspecified ear: Secondary | ICD-10-CM | POA: Diagnosis not present

## 2021-11-24 DIAGNOSIS — N281 Cyst of kidney, acquired: Secondary | ICD-10-CM | POA: Diagnosis not present

## 2021-11-24 DIAGNOSIS — N2581 Secondary hyperparathyroidism of renal origin: Secondary | ICD-10-CM | POA: Diagnosis not present

## 2021-11-24 DIAGNOSIS — I129 Hypertensive chronic kidney disease with stage 1 through stage 4 chronic kidney disease, or unspecified chronic kidney disease: Secondary | ICD-10-CM | POA: Diagnosis not present

## 2021-11-24 DIAGNOSIS — M8580 Other specified disorders of bone density and structure, unspecified site: Secondary | ICD-10-CM | POA: Diagnosis not present

## 2021-11-26 ENCOUNTER — Encounter: Payer: Self-pay | Admitting: Rehabilitative and Restorative Service Providers"

## 2021-11-26 ENCOUNTER — Ambulatory Visit (INDEPENDENT_AMBULATORY_CARE_PROVIDER_SITE_OTHER): Payer: Medicare Other | Admitting: Rehabilitative and Restorative Service Providers"

## 2021-11-26 DIAGNOSIS — M6281 Muscle weakness (generalized): Secondary | ICD-10-CM

## 2021-11-26 DIAGNOSIS — R262 Difficulty in walking, not elsewhere classified: Secondary | ICD-10-CM

## 2021-11-26 DIAGNOSIS — R293 Abnormal posture: Secondary | ICD-10-CM | POA: Diagnosis not present

## 2021-11-26 DIAGNOSIS — M5416 Radiculopathy, lumbar region: Secondary | ICD-10-CM

## 2021-11-26 NOTE — Therapy (Signed)
OUTPATIENT PHYSICAL THERAPY TREATMENT/DISCHARGE  Patient Name: Carrie Lucas MRN: [id number] DOB:20-Jun-1940, 81 y.o., female Today's Date: 11/26/2021  PHYSICAL THERAPY DISCHARGE SUMMARY  Visits from Start of Care: 11  Current functional level related to goals / functional outcomes: See note   Remaining deficits: See note   Education / Equipment: Updated HEP   Patient agrees to discharge. Patient goals were met. Patient is being discharged due to meeting the stated rehab goals.   END OF SESSION:   PT End of Session - 11/26/21 0849     Visit Number 11    Number of Visits 12    Progress Note Due on Visit 14    PT Start Time 0846    PT Stop Time 0930    PT Time Calculation (min) 44 min    Activity Tolerance Patient tolerated treatment well;No increased pain    Behavior During Therapy WFL for tasks assessed/performed             Past Medical History:  Diagnosis Date   Arthritis    R middle finger, chronic arthralgias   BCC (basal cell carcinoma of skin) 06/2015, 06/2017   supraclavicular, nasal bridge, R lower back - [name]   Carotid stenosis, asymptomatic 12/16/2014   Mild bilateral 1-39%, consider rpt [place] 1 yr (11/2014)    Chronic renal disease, stage III ([place])    Colon polyp    DDD (degenerative disc disease), lumbar 2014   severe facet degeneration L4/5, mod spinal setnosis, mild foraminal stenosis   Dysplastic nevus 08/04/2021   To mid upper back, with severe atypia, rec re-excision ([name]) 06/2021   Headache    Hepatic steatosis 2012   mild by CT   History of melanoma 2003   face (Dr Luetta Nutting)   HLD (hyperlipidemia)    HTN (hypertension)    IBS (irritable bowel syndrome)    Insomnia    LPRD (laryngopharyngeal reflux disease)    [facility])   Osteopenia 2011   DEXA T -1.6 hip, -1.3 forearm (12/2015)   Sialadenitis 05/2013   acute [facility])   SK (seborrheic keratosis) 06/2020   L lateral upper back ([name])   Past Surgical History:  Procedure Laterality  Date   ABI  06/2010   WNL   BACK SURGERY  1995   lumbar   BILATERAL OOPHORECTOMY  2007   ovarian cysts   BLADDER SUSPENSION     midurethral sling for SUI   CARPAL TUNNEL RELEASE Bilateral 2000   COLONOSCOPY  _0 TAx1, diverticulosis, rpt 5 yrs ([facility])   COLONOSCOPY  03/2016   WNL ([facility])   DEXA  2011   mild osteopenia (T -1.2 R femur)   ESI  2014   L L4 transforaminal   ESOPHAGOGASTRODUODENOSCOPY  12/24/2010   reflux esophagitis   MOHS SURGERY  2016   BCC   RADIAL KERATOTOMY Left    TONSILLECTOMY  Childhood   VAGINAL HYSTERECTOMY     bladder drop   Patient Active Problem List   Diagnosis Date Noted   Piriformis syndrome of both sides 10/05/2021   Dysplastic nevus 08/04/2021   Hypercalcemia 08/01/2021   Positive ANA (antinuclear antibody) 03/05/2021   Erosive osteoarthritis of both hands 01/20/2021   Skin rash 07/16/2020   Chronic venous insufficiency of lower extremity 07/01/2020   Paresthesia of left lower extremity 07/01/2020   Varicose veins of leg with pain, right 07/01/2020   Lymphocytosis 01/07/2020   Bilateral hearing loss 01/07/2020   Dizziness 07/23/2019  Vision problem 07/05/2019   Pedal edema 09/11/2018   Renal cyst, acquired, right 07/08/2018   Spondylolisthesis, lumbar region 12/20/2017   UTI (urinary tract infection) 10/22/2016   BPPV (benign paroxysmal positional vertigo), left 09/07/2016   Advanced care planning/counseling discussion 12/16/2014   Carotid stenosis, asymptomatic 12/16/2014   Pterygium of both eyes 05/06/2014   History of radial keratotomy 03/13/2014   Medicare annual wellness visit, subsequent 12/12/2013   CKD (chronic kidney disease) stage 3, GFR 30-59 ml/min (HCC) 12/12/2013   DDD (degenerative disc disease), lumbar    Hepatic steatosis    Osteopenia    HTN (hypertension)    HLD (hyperlipidemia)    LPRD (laryngopharyngeal reflux disease)    Insomnia    IBS (irritable bowel syndrome)    Osteoarthritis    Abdominal  bloating 09/09/2013     THERAPY DIAG:  Abnormal posture  Difficulty in walking, not elsewhere classified  Muscle weakness (generalized)  Radiculopathy, lumbar region  PCP: Ria Bush, MD   REFERRING PROVIDER: Ria Bush, MD   REFERRING DIAG: G57.03 (ICD-10-CM) - Piriformis syndrome of both sides    Rationale for Evaluation and Treatment Rehabilitation   ONSET DATE:    SUBJECTIVE:                                                                                                                                                                                            SUBJECTIVE STATEMENT: Low back and gluteal pain is less frequent since starting PT.  Overall progress has been significant, particularly pain at night which is now rare and was constant.  She has mild tingling in the morning which is reduced and eliminated with her HEP.   PERTINENT HISTORY:  OA, stage 3 chronic renal disease, severe DDD L4-5, HLD, HTN, previous low back surgery in 1995, chronic venous insufficiency, HOH, dizziness/BPPV, pedal edema, carotid stenosis    PAIN:  Are you having pain? Yes: NPRS scale: 0-3/10  Pain location: B butt cheeks and L leg to the toes in morning, goes away with stretching Pain description: Steady ache that increases with prolonged WB Aggravating factors: Standing and walking Relieving factors: Sitting and taking a break     PRECAUTIONS: Other: Previous back surgery so flexion and flexion with rotation are discouraged   WEIGHT BEARING RESTRICTIONS No   FALLS:  Has patient fallen in last 6 months? No   LIVING ENVIRONMENT: Lives with: lives with their family Lives in: House/apartment Stairs:  Uses a handrail Has following equipment at home: None   OCCUPATION: Retired   PLOF: Independent   PATIENT GOALS Return to walking normally  OBJECTIVE:    DIAGNOSTIC FINDINGS:  Lumbar vertebral body height are preserved without evidence of fracture. Grade 1  anterolisthesis of L4 on L5. No spondylolysis identified. Severe intervertebral disc space narrowing at L4-L5 and moderate narrowing at L5-S1. Facet arthropathy.   PATIENT SURVEYS:  11/26/2021 FOTO 84 (Goal met) 11/13/2021 FOTO 56 (Goal 64, was 52) 10/13/2021 FOTO 52, Goal 64 in 11 visits   SCREENING FOR RED FLAGS: Bowel or bladder incontinence: No Spinal tumors: No Cauda equina syndrome: No Compression fracture: No Abdominal aneurysm: No   COGNITION:           Overall cognitive status: Within functional limits for tasks assessed                          SENSATION: Chamia notes occasional L sciatica to the toes   MUSCLE LENGTH: 11/13/2021 Hamstrings: Right 45 deg; Left 40 deg 10/28/2021 Hamstrings: Right 40 deg; Left 40 deg 10/13/2021 Hamstrings: Right 35 deg; Left 35 deg   POSTURE: rounded shoulders, forward head, decreased lumbar lordosis, and flexed trunk      LUMBAR ROM:    Active  A/PROM  10/13/2021 AROM 10/28/2021 AROM 11/13/2021  Flexion      Extension _0 Right lateral flexion      Left lateral flexion      Right rotation      Left rotation       (Blank rows = not tested)   LOWER EXTREMITY ROM:      Passive  Right 10/13/2021 Left 10/13/2021 L/R 10/28/2021 L/R 11/13/2021  Hip flexion 95 95 105/105 105/110  Hip extension        Hip abduction        Hip adduction        Hip internal rotation 15 15 20/20 20/20  Hip external rotation 23 12 30/30 25/33  Knee flexion        Knee extension        Ankle dorsiflexion        Ankle plantarflexion        Ankle inversion        Ankle eversion         (Blank rows = not tested)   LOWER EXTREMITY MMT:     MMT  Eval 10/28/2021 11/13/2021  Spine strength test Blanch Media was unable to assume postures required for spine strength testing Able to assume test posture but unable to hold for any measurable length of time  Deferred due to painful day yesterday      GAIT: Distance walked: 100+ feet Assistive device utilized: None Level  of assistance: Complete Independence Comments: Forward lean at the trunk and pain increases with increased standing and walking       TODAY'S TREATMENT  11/26/2021 Lumbar extension AROM (limited range, hips forward) 10X 3 seconds Shoulder blade pinches 5X 5 seconds Figure 4 stretch 4X 20 seconds B Hamstrings stretch (other leg straight) 4X 20 seconds B Clams (lie R, lift L) 2 sets of 10 with 5 second hold and slow eccentrics with blue resistance band Reverse Clams (lie R, lift L) 10X 5 seconds slow eccentrics Prone alternating hip extensions 2 sets of 10 for 5 seconds (forehead on forearms, toes pulled back) Side lie hip abduction 2 sets of 10 B (1/4 turn to stomach)  Hip hike in parallel bars 1 set of 10 for 3 seconds Bridging 10X 5 seconds   11/13/2021 Lumbar extension AROM (limited range, hips  forward) 10X 3 seconds Shoulder blade pinches 5X 5 seconds Figure 4 stretch 4X 20 seconds B Hamstrings stretch (other leg straight) 4X 20 seconds B Clams (lie R, lift L) 2 sets of 10 with 3 second hold and slow eccentrics with blue resistance band Reverse Clams (lie R, lift L) 10X 3 seconds slow eccentrics *Prone alternating hip extensions 1 set of 10 for 5 seconds (forehead on forearms, toes pulled back) *Side lie hip abduction 2 sets of 10 B (1/4 turn to stomach)  *Hip hike in parallel bars 1 set of 10 for 3 seconds Bridging 10X 5 seconds  * Is HEP only (IE not performed today in the office due to reassessment time)   11/11/2021 Hip hike in parallel bars 10 for 5 seconds Sidestepping with red band around ankles 3 round trips in bars Step ups onto 6 inch step X 10 bilat Lateral step ups 6 inch X 10 bilat Lumbar extension AROM (limited range, hips forward) 10X 3 seconds Shoulder blade pinches 10X 5 seconds Figure 4 stretch 4X 20 seconds B Hamstrings stretch (other leg straight) 4X 20 seconds B Clams (lie R, lift L) 2 sets of 10 with 3 second hold and slow eccentrics with blue  resistance band Reverse Clams (lie R, lift L) 10X 3 seconds slow eccentrics Prone alternating hip extensions 3 sets of 10 for 5 seconds (forehead on forearms, toes pulled back) Side lie hip abduction 2 sets of 10 B (1/4 turn to stomach) Bridging 10X 5 seconds Bridging with marching, attempted but hips too weak  +  PATIENT EDUCATION:  Education details: See above Person educated: Patient Education method: Explanation, Demonstration, Tactile cues, Verbal cues, and Handouts Education comprehension: verbalized understanding, returned demonstration, verbal cues required, tactile cues required, and needs further education     HOME EXERCISE PROGRAM: Access Code: [email or web address] URL: [email or web address] Date: 11/26/2021 Prepared by: Vista Mink  Exercises - Supine Figure 4 Piriformis Stretch  - 2 x daily - 7 x weekly - 1 sets - 5 reps - 20 seconds hold - Sit to Stand with Armchair  - 2 x daily - 7 x weekly - 1 sets - 5 reps - Standing Hip Hiking  - 2 x daily - 1 x weekly - 2 sets - 10 reps - 3 seconds hold - Yoga Bridge  - 1 x daily - 1 x weekly - 1 sets - 10 reps - 5 seconds hold - Clamshell  - 1 x daily - 7 x weekly - 2 sets - 10 reps - 5 seconds hold - Prone Hip Extension  - 1 x daily - 7 x weekly - 2 sets - 10 reps - 3 seconds hold - Supine Hamstring Stretch  - 2 x daily - 7 x weekly - 1 sets - 5 reps - 20 seconds hold - Sidelying Hip Abduction  - 1 x daily - 7 x weekly - 2 sets - 10 reps - 5 seconds hold - Sidelying Reverse Clamshell  - 1 x daily - 7 x weekly - 1 sets - 10 reps - 3 seconds hold   ASSESSMENT:   CLINICAL IMPRESSION: Brittani has made excellent progress with her supervised physical therapy.  Comfort at night, body mechanics and overall function are significantly improved.  Mild peripheral symptoms in the morning are quickly relieved with her stretches and she has less days with pain and improved self-reported function and quality of life.  Brandin appears to be  ready for independent rehabilitation.  OBJECTIVE IMPAIRMENTS Abnormal gait, decreased activity tolerance, decreased endurance, decreased knowledge of condition, difficulty walking, decreased ROM, decreased strength, decreased safety awareness, impaired perceived functional ability, increased muscle spasms, impaired flexibility, improper body mechanics, postural dysfunction, and pain.    ACTIVITY LIMITATIONS bending, standing, sleeping, stairs, bed mobility, and locomotion level   PARTICIPATION LIMITATIONS: community activity and yard work   PERSONAL FACTORS OA, stage 3 chronic renal disease, severe DDD L4-5, HLD, HTN, previous low back surgery in 1995, chronic venous insufficiency, HOH, dizziness/BPPV, pedal edema, carotid stenosis  are also affecting patient's functional outcome.    REHAB POTENTIAL: Good   CLINICAL DECISION MAKING: Stable/uncomplicated   EVALUATION COMPLEXITY: Low     GOALS: Goals reviewed with patient? Yes   SHORT TERM GOALS: Target date: 11/10/2021   Improve lumbar extension to a pain free 10 degrees Baseline: Painful 5 degrees Goal status: Met 11/13/2021   2.  Ireta will be able to demonstrate correct log roll, use a lumbar roll correctly and demonstrate correct golfer's and diagonal squat lift techniques Baseline: Started at evaluation Goal status: Met 11/26/2021   3.  Chrissy will report no L sciatica Baseline: L sciatica to the toes Goal status: Met 11/26/2021     LONG TERM GOALS: Target date: 12/08/2021   Improve FOTO to 64 Baseline: 52 Goal status: Met 11/26/2021   2.  Amenah will report low back and B gluteal pain consistently 0-3/10 on the Numeric Pain Rating Scale Baseline: 1-5/10 Goal status: Met 11/26/2021   3.  Improve B LE flexibility for: hip flexors 105; hamstrings 45; hip ER 40 degrees Baseline: 95; 35; 12/23 respectively Goal status: Partially Met 11/13/2021   4.  Kayani will demonstrate improved spine strength by the prone spine strength test  and FOTO scores Baseline: See FOTO and unable to assume test positions for the prone spine strength test Goal status: Met 11/26/2021   5.  Basia will be independent with her HEP at DC Baseline: Started 10/13/2021 Goal status: Met 11/26/2021     PLAN: PT FREQUENCY: DC   PT DURATION: DC   PLANNED INTERVENTIONS: Therapeutic exercises, Therapeutic activity, Gait training, Patient/Family education, Joint mobilization, Stair training, Dry Needling, Electrical stimulation, Cryotherapy, Traction, and Manual therapy.   PLAN FOR NEXT SESSION: DC from supervised PT   Farley Ly, PT, MPT 11/26/2021, 4:17 PM

## 2021-12-03 ENCOUNTER — Encounter: Payer: Medicare Other | Admitting: Rehabilitative and Restorative Service Providers"

## 2021-12-09 ENCOUNTER — Encounter: Payer: Medicare Other | Admitting: Rehabilitative and Restorative Service Providers"

## 2021-12-16 ENCOUNTER — Encounter: Payer: Medicare Other | Admitting: Physical Therapy

## 2021-12-21 ENCOUNTER — Encounter: Payer: Self-pay | Admitting: Family Medicine

## 2021-12-21 DIAGNOSIS — G8929 Other chronic pain: Secondary | ICD-10-CM

## 2021-12-23 ENCOUNTER — Encounter: Payer: Self-pay | Admitting: *Deleted

## 2021-12-24 ENCOUNTER — Ambulatory Visit
Admission: RE | Admit: 2021-12-24 | Discharge: 2021-12-24 | Disposition: A | Discharge status: Home / Self Care | Payer: Medicare Other | Source: Ambulatory Visit | Attending: Family Medicine | Admitting: Family Medicine

## 2021-12-24 ENCOUNTER — Encounter: Payer: Medicare Other | Admitting: Rehabilitative and Restorative Service Providers"

## 2021-12-24 DIAGNOSIS — M48061 Spinal stenosis, lumbar region without neurogenic claudication: Secondary | ICD-10-CM | POA: Diagnosis not present

## 2021-12-24 DIAGNOSIS — M545 Low back pain, unspecified: Secondary | ICD-10-CM | POA: Diagnosis not present

## 2021-12-24 DIAGNOSIS — M5416 Radiculopathy, lumbar region: Secondary | ICD-10-CM | POA: Diagnosis not present

## 2021-12-24 DIAGNOSIS — G8929 Other chronic pain: Secondary | ICD-10-CM

## 2021-12-26 ENCOUNTER — Other Ambulatory Visit: Payer: Self-pay | Admitting: Family Medicine

## 2021-12-26 DIAGNOSIS — M5136 Other intervertebral disc degeneration, lumbar region: Secondary | ICD-10-CM

## 2021-12-26 DIAGNOSIS — M48061 Spinal stenosis, lumbar region without neurogenic claudication: Secondary | ICD-10-CM

## 2021-12-26 DIAGNOSIS — M47816 Spondylosis without myelopathy or radiculopathy, lumbar region: Secondary | ICD-10-CM | POA: Insufficient documentation

## 2021-12-29 NOTE — Telephone Encounter (Signed)
Noted  

## 2021-12-29 NOTE — Telephone Encounter (Signed)
E-scribed refill.  Pt scheduled for [facility] wellness 01/18/22 and labs on 01/08/22.  However, her CPE on 01/21/2023.  Plz r/s CPE in a 30 min slot after 01/18/22.

## 2021-12-29 NOTE — Telephone Encounter (Signed)
Patient has been scheduled

## 2021-12-31 ENCOUNTER — Other Ambulatory Visit: Payer: Self-pay | Admitting: Family Medicine

## 2022-01-07 ENCOUNTER — Other Ambulatory Visit: Payer: Self-pay | Admitting: Family Medicine

## 2022-01-07 DIAGNOSIS — M85852 Other specified disorders of bone density and structure, left thigh: Secondary | ICD-10-CM

## 2022-01-07 DIAGNOSIS — N1832 Chronic kidney disease, stage 3b: Secondary | ICD-10-CM

## 2022-01-07 DIAGNOSIS — E782 Mixed hyperlipidemia: Secondary | ICD-10-CM

## 2022-01-08 ENCOUNTER — Other Ambulatory Visit (INDEPENDENT_AMBULATORY_CARE_PROVIDER_SITE_OTHER): Payer: Medicare Other

## 2022-01-08 DIAGNOSIS — N1832 Chronic kidney disease, stage 3b: Secondary | ICD-10-CM | POA: Diagnosis not present

## 2022-01-08 DIAGNOSIS — E782 Mixed hyperlipidemia: Secondary | ICD-10-CM

## 2022-01-08 DIAGNOSIS — M85852 Other specified disorders of bone density and structure, left thigh: Secondary | ICD-10-CM

## 2022-01-08 LAB — LIPID PANEL
Cholesterol: 122 mg/dL (ref 0–200)
HDL: 37.7 mg/dL — ABNORMAL LOW (ref >39.00)
LDL Cholesterol: 47 mg/dL (ref 0–99)
NonHDL: 84.44
Total CHOL/HDL Ratio: 3
Triglycerides: 189 mg/dL — ABNORMAL HIGH (ref 0.0–149.0)
VLDL: 37.8 mg/dL (ref 0.0–40.0)

## 2022-01-08 LAB — COMPREHENSIVE METABOLIC PANEL
ALT: 10 U/L (ref 0–35)
AST: 13 U/L (ref 0–37)
Albumin: 4.1 g/dL (ref 3.5–5.2)
Alkaline Phosphatase: 100 U/L (ref 39–117)
BUN: 22 mg/dL (ref 6–23)
CO2: 23 mEq/L (ref 19–32)
Calcium: 9.6 mg/dL (ref 8.4–10.5)
Chloride: 106 mEq/L (ref 96–112)
Creatinine, Ser: 1.24 mg/dL — ABNORMAL HIGH (ref 0.40–1.20)
GFR: 40.81 mL/min — ABNORMAL LOW (ref >60.00)
Glucose, Bld: 92 mg/dL (ref 70–99)
Potassium: 4.5 mEq/L (ref 3.5–5.1)
Sodium: 139 mEq/L (ref 135–145)
Total Bilirubin: 0.6 mg/dL (ref 0.2–1.2)
Total Protein: 6.8 g/dL (ref 6.0–8.3)

## 2022-01-08 LAB — CBC WITH DIFFERENTIAL/PLATELET
Basophils Absolute: 0.1 10*3/uL (ref 0.0–0.1)
Basophils Relative: 0.8 % (ref 0.0–3.0)
Eosinophils Absolute: 0.2 10*3/uL (ref 0.0–0.7)
Eosinophils Relative: 2.8 % (ref 0.0–5.0)
HCT: 37.8 % (ref 36.0–46.0)
Hemoglobin: 12.7 g/dL (ref 12.0–15.0)
Lymphocytes Relative: 46.3 % — ABNORMAL HIGH (ref 12.0–46.0)
Lymphs Abs: 3 10*3/uL (ref 0.7–4.0)
MCHC: 33.5 g/dL (ref 30.0–36.0)
MCV: 95.1 fl (ref 78.0–100.0)
Monocytes Absolute: 0.6 10*3/uL (ref 0.1–1.0)
Monocytes Relative: 8.6 % (ref 3.0–12.0)
Neutro Abs: 2.7 10*3/uL (ref 1.4–7.7)
Neutrophils Relative %: 41.5 % — ABNORMAL LOW (ref 43.0–77.0)
Platelets: 296 10*3/uL (ref 150.0–400.0)
RBC: 3.98 Mil/uL (ref 3.87–5.11)
RDW: 12.9 % (ref 11.5–15.5)
WBC: 6.6 10*3/uL (ref 4.0–10.5)

## 2022-01-08 LAB — MICROALBUMIN / CREATININE URINE RATIO
Creatinine,U: 46.4 mg/dL
Microalb Creat Ratio: 2.9 mg/g (ref 0.0–30.0)
Microalb, Ur: 1.3 mg/dL (ref 0.0–1.9)

## 2022-01-08 LAB — VITAMIN D 25 HYDROXY (VIT D DEFICIENCY, FRACTURES): VITD: 41.06 ng/mL (ref 30.00–100.00)

## 2022-01-09 LAB — PARATHYROID HORMONE, INTACT (NO CA): PTH: 40 pg/mL (ref 16–77)

## 2022-01-18 ENCOUNTER — Ambulatory Visit: Payer: Medicare Other

## 2022-01-19 ENCOUNTER — Telehealth: Payer: Medicare Other | Admitting: Family Medicine

## 2022-01-19 ENCOUNTER — Encounter: Payer: Self-pay | Admitting: Family Medicine

## 2022-01-19 ENCOUNTER — Ambulatory Visit (INDEPENDENT_AMBULATORY_CARE_PROVIDER_SITE_OTHER): Payer: Medicare Other | Admitting: Family Medicine

## 2022-01-19 VITALS — BP 142/62 | HR 65 | Temp 97.6°F | Ht 61.5 in | Wt 155.2 lb

## 2022-01-19 DIAGNOSIS — I10 Essential (primary) hypertension: Secondary | ICD-10-CM

## 2022-01-19 DIAGNOSIS — H9193 Unspecified hearing loss, bilateral: Secondary | ICD-10-CM

## 2022-01-19 DIAGNOSIS — M85852 Other specified disorders of bone density and structure, left thigh: Secondary | ICD-10-CM | POA: Diagnosis not present

## 2022-01-19 DIAGNOSIS — G4700 Insomnia, unspecified: Secondary | ICD-10-CM

## 2022-01-19 DIAGNOSIS — N1832 Chronic kidney disease, stage 3b: Secondary | ICD-10-CM | POA: Diagnosis not present

## 2022-01-19 DIAGNOSIS — M154 Erosive (osteo)arthritis: Secondary | ICD-10-CM

## 2022-01-19 DIAGNOSIS — R400 Somnolence: Secondary | ICD-10-CM | POA: Diagnosis not present

## 2022-01-19 DIAGNOSIS — Z0000 Encounter for general adult medical examination without abnormal findings: Secondary | ICD-10-CM | POA: Diagnosis not present

## 2022-01-19 DIAGNOSIS — M47816 Spondylosis without myelopathy or radiculopathy, lumbar region: Secondary | ICD-10-CM

## 2022-01-19 DIAGNOSIS — Z23 Encounter for immunization: Secondary | ICD-10-CM | POA: Diagnosis not present

## 2022-01-19 DIAGNOSIS — E782 Mixed hyperlipidemia: Secondary | ICD-10-CM | POA: Diagnosis not present

## 2022-01-19 DIAGNOSIS — M48061 Spinal stenosis, lumbar region without neurogenic claudication: Secondary | ICD-10-CM | POA: Diagnosis not present

## 2022-01-19 DIAGNOSIS — R5382 Chronic fatigue, unspecified: Secondary | ICD-10-CM | POA: Diagnosis not present

## 2022-01-19 DIAGNOSIS — D72820 Lymphocytosis (symptomatic): Secondary | ICD-10-CM | POA: Diagnosis not present

## 2022-01-19 LAB — TSH: TSH: 1.44 u[IU]/mL (ref 0.35–5.50)

## 2022-01-19 LAB — VITAMIN B12: Vitamin B-12: 235 pg/mL (ref 211–911)

## 2022-01-19 NOTE — Assessment & Plan Note (Addendum)
Ongoing for months, associated with difficulty sleeping and endorses OSA symptoms including daytime somnolence, non-restorative sleep, loud snoring and occasional paroxysmal nocturnal dyspnea. ESS = 8. Check labs for reversible causes of fatigue. Discussed sleep medicine evaluation - referral placed.

## 2022-01-19 NOTE — Patient Instructions (Addendum)
Flu shot today I will check in on PM&R referral ([place]).  Consider prolia injection - see below for information. We will price out Prolia for you. If interested, check with pharmacy about new 2 shot shingles series (shingrix). Check with pharmacy about RSV as well.  Good to see you today Return in 3 months for follow up visit.  Labs today Sleep questionnaire today. We may refer you to sleep doctor.   Denosumab Injection (Osteoporosis) What is this medication? DENOSUMAB (den oh SUE mab) prevents and treats osteoporosis. It works by Paramedic stronger and less likely to break (fracture). It is a monoclonal antibody. This medicine may be used for other purposes; ask your health care provider or pharmacist if you have questions. COMMON BRAND NAME(S): Prolia What should I tell my care team before I take this medication? They need to know if you have any of these conditions: Dental or gum disease, or plan to have dental surgery or a tooth pulled Infection Kidney disease Low levels of calcium or vitamin D in your blood On dialysis Poor nutrition Skin conditions Thyroid disease, or have had thyroid or parathyroid surgery Trouble absorbing minerals in your stomach or intestine An unusual reaction to denosumab, other medications, foods, dyes, or preservatives Pregnant or trying to get pregnant Breast-feeding How should I use this medication? This medication is injected under the skin. It is given by your care team in a hospital or clinic setting. A special MedGuide will be given to you before each treatment. Be sure to read this information carefully each time. Talk to your care team about the use of this medication in children. Special care may be needed. Overdosage: If you think you have taken too much of this medicine contact a poison control center or emergency room at once. NOTE: This medicine is only for you. Do not share this medicine with others. What if I miss a dose? Keep  appointments for follow-up doses. It is important not to miss your dose. Call your care team if you are unable to keep an appointment. What may interact with this medication? Do not take this medication with any of the following: Other medications that contain denosumab This medication may also interact with the following: Medications that lower your chance of fighting infection Steroid medications, such as prednisone or cortisone This list may not describe all possible interactions. Give your health care provider a list of all the medicines, herbs, non-prescription drugs, or dietary supplements you use. Also tell them if you smoke, drink alcohol, or use illegal drugs. Some items may interact with your medicine. What should I watch for while using this medication? Your condition will be monitored carefully while you are receiving this medication. You may need blood work while taking this medication. This medication may increase your risk of getting an infection. Call your care team for advice if you get a fever, chills, sore throat, or other symptoms of a cold or flu. Do not treat yourself. Try to avoid being around people who are sick. Tell your dentist and dental surgeon that you are taking this medication. You should not have major dental surgery while on this medication. See your dentist to have a dental exam and fix any dental problems before starting this medication. Take good care of your teeth while on this medication. Make sure you see your dentist for regular follow-up appointments. You should make sure you get enough calcium and vitamin D while you are taking this medication. Discuss the foods  you eat and the vitamins you take with your care team. Talk to your care team if you are pregnant or think you might be pregnant. This medication can cause serious birth defects if taken during pregnancy and for 5 months after the last dose. You will need a negative pregnancy test before starting this  medication. Contraception is recommended while taking this medication and for 5 months after the last dose. Your care team can help you find the option that works for you. Talk to your care team before breastfeeding. Changes to your treatment plan may be needed. What side effects may I notice from receiving this medication? Side effects that you should report to your care team as soon as possible: Allergic reactions--skin rash, itching, hives, swelling of the face, lips, tongue, or throat Infection--fever, chills, cough, sore throat, wounds that don't heal, pain or trouble when passing urine, general feeling of discomfort or being unwell Low calcium level--muscle pain or cramps, confusion, tingling, or numbness in the hands or feet Osteonecrosis of the jaw--pain, swelling, or redness in the mouth, numbness of the jaw, poor healing after dental work, unusual discharge from the mouth, visible bones in the mouth Severe bone, joint, or muscle pain Skin infection--skin redness, swelling, warmth, or pain Side effects that usually do not require medical attention (report these to your care team if they continue or are bothersome): Back pain Headache Joint pain Muscle pain Pain in the hands, arms, legs, or feet Runny or stuffy nose Sore throat This list may not describe all possible side effects. Call your doctor for medical advice about side effects. You may report side effects to FDA at 1-800-FDA-1088. Where should I keep my medication? This medication is given in a hospital or clinic. It will not be stored at home. NOTE: This sheet is a summary. It may not cover all possible information. If you have questions about this medicine, talk to your doctor, pharmacist, or health care provider.  2023 Elsevier/Gold Standard (2021-08-24 00:00:00)

## 2022-01-19 NOTE — Assessment & Plan Note (Signed)
Regularly sees nephrology - appreciate Dr Luis Abed care.

## 2022-01-19 NOTE — Assessment & Plan Note (Addendum)
Completed PT course, still pending PM&R eval.  She requests Dr Nelva Bush - new referral placed.

## 2022-01-19 NOTE — Assessment & Plan Note (Addendum)
By MRI. Completed PT course, still pending PM&R eval.

## 2022-01-19 NOTE — Assessment & Plan Note (Signed)
Minimal - continue to monitor.

## 2022-01-19 NOTE — Assessment & Plan Note (Signed)

## 2022-01-19 NOTE — Telephone Encounter (Signed)
Can we price out prolia for patient? Thanks!

## 2022-01-19 NOTE — Assessment & Plan Note (Signed)
Chronic, overall stable. Mildly elevated in office today however this morning home reading well controlled. Continue amlodipine, metoprolol, valsartan.

## 2022-01-19 NOTE — Assessment & Plan Note (Addendum)
Reviewed option of prolia as well as mechanism of action and need to take as close to q6 mo as possible - will price out for patient. She checked with nephrology and told ok from renal standpoint.  Prolia handout provided

## 2022-01-19 NOTE — Progress Notes (Signed)
Patient ID: Carrie Lucas, female    DOB: 1940-10-15, 81 y.o.   MRN: [id number]  This visit was conducted in person.  BP (!) 142/62 (BP Location: Right Arm, Cuff Size: Normal)   Pulse 65   Temp 97.6 F (36.4 C) (Temporal)   Ht 5' 1.5" (1.562 m)   Wt 155 lb 4 oz (70.4 kg)   SpO2 94%   BMI 28.86 kg/m   115/60 this morning   CC: AMW  Subjective:   HPI: Carrie Lucas is a 81 y.o. female presenting on 01/19/2022 for Medicare Wellness (Pt brought home BP monitor to compare.  Reading in office today, 144/69.)   Did not see health advisor this year.   Hearing Screening - Comments:: Wears bilateral hearing aids.  Wearing at today's OV.  Vision Screening - Comments:: Last eye exam, 03/2021.  [place] Office Visit from 01/19/2022 in [facility] [place]  PHQ-2 Total Score 0          01/19/2022   11:06 AM 01/20/2021    2:03 PM 01/07/2020   11:29 AM 01/02/2019   10:40 AM 12/27/2017   10:21 AM  Fall Risk   Falls in the past year? 1 0 0 0 No  Number falls in past yr: 1      Injury with Fall? 0      Risk for fall due to :    Medication side effect   Follow up    Falls evaluation completed;Falls prevention discussed    Sees [facility] nephrology Dr Royce Macadamia for CKD stage 3 - thought due to hypertensive nephropathy. Continues amlodipine and valsartan and most recent addition of Toprol XL. Lasix stopped to limit volume depletion. Higher amlodipine causes ankle swelling. Last seen 11/2021.    Erosive osteoarthritis B hands has seen rheumatology (Dr Benjamine Mola) - tylenol '650mg'$  and topical voltaren not helpful. CBD salve helpful. ANA positive - rheum eval negative (anti-dsDNA, anti-Smith, anti-SSA).   Ongoing low back pain despite 16 sessions of PT - MRI showed facet arthropathy and increased spinal stenosis L3/4. Referred to PM&R - she received call from [facility] outpatient rehab trying to reschedule PT.   Chronic insomnia with chronic fatigue. Endorses non-restorative sleep, loud snoring, occ  PNDyspnea. Daytime somnolence.  Only drinks 1 cup of coffee in the morning.  No witnessed apnea, morning headaches.  Excessive fatigue even after going to grocery store.  Occasional heart flutter ie skipped beat.    Preventative:   COLONOSCOPY Date: 11/2010 TAx1, diverticulosis, rpt 5 yrs (Outlaw). COLONOSCOPY 03/2016 - WNL (Outlaw).  No blood in stool, bowel changes.  Well woman with GYN Dr. Orpah Greek, last saw 2017. S/p hysterectomy and oophorectomy. Discussed monitoring for skin changes (vaginal/vulvar cancer).  [facility] +fmhx.  DEXA - 12/2015 DEXA T -1.6 hip, -1.3 forearm - mild osteopenia.  DEXA 06/2021 - T -1.7 LFN with increased fracture risk (4.8% hip, 20.2% major fx). To consider prolia injection - ok by nephrology.  Lung cancer screening - not eligible Flu yearly  COVID vaccine - [place] 04/2019, 06/2019, booster 01/2020, bivalent 12/2020 Pneumovax 08/2005. Prevnar-13 11/2013  Tdap 04/2013   zostavax - 08/2013 shingrix - discussed, to check with pharmacist Advanced directives: Would want Fritz Pickerel or daughter to be POA - but form not filled out. Living will scanned 11/2016 Seat belt use discussed.  Sunscreen use discussed. No changing moles on skin. H/o melanoma, sees derm Dr Elvera Lennox yearly.  Never smoker. No alcohol. 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 Interim medical history since our last visit reviewed. Allergies and medications reviewed and updated. Outpatient Medications Prior to Visit  Medication Sig Dispense Refill   acetaminophen (TYLENOL)  650 MG CR tablet Take 1 tablet (650 mg total) by mouth 3 (three) times daily as needed for pain.     amLODipine (NORVASC) 5 MG tablet Take 1 tablet by mouth once daily 90 tablet 2   aspirin EC 81 MG tablet Take 1 tablet (81 mg total) by mouth every Monday, Wednesday, and Friday.     atorvastatin (LIPITOR) 20 MG tablet Take 1 tablet by mouth once daily 90 tablet 0   Cholecalciferol (VITAMIN D3) 1000 units CAPS Take 1 capsule by mouth daily.     metoprolol succinate (TOPROL-XL) 25 MG 24 hr tablet Take 1/2 (one-half) tablet by mouth once daily 45 tablet 0   valsartan (DIOVAN) 320 MG tablet Take 1 tablet by mouth once daily 90 tablet 2   No facility-administered medications prior to visit.     Per HPI unless specifically indicated in ROS section below Review of Systems  Objective:  BP (!) 142/62 (BP Location: Right Arm, Cuff Size: Normal)   Pulse 65   Temp 97.6 F (36.4 C) (Temporal)   Ht 5' 1.5" (1.562 m)   Wt 155 lb 4 oz (70.4 kg)   SpO2 94%   BMI 28.86 kg/m   Wt Readings from Last 3 Encounters:  01/19/22 155 lb 4 oz (70.4 kg)  10/05/21 154 lb 4 oz (70 kg)  07/20/21 152 lb 2 oz (69 kg)      Physical Exam Vitals and nursing note reviewed.  Constitutional:      Appearance: Normal appearance. She is not ill-appearing.  HENT:     Head: Normocephalic and atraumatic.     Right Ear: Tympanic membrane, ear canal and external ear normal. There is no impacted cerumen.     Left Ear: Tympanic membrane, ear canal and external ear normal. There is no impacted cerumen.  Eyes:     General:        Right eye: No discharge.        Left eye: No discharge.     Extraocular Movements: Extraocular movements intact.     Conjunctiva/sclera: Conjunctivae normal.     Pupils: Pupils are equal, round, and reactive to light.  Neck:     Thyroid: No thyroid mass or thyromegaly.     Vascular: No carotid bruit.  Cardiovascular:     Rate and Rhythm: Normal rate and regular rhythm.     Pulses: Normal  pulses.     Heart sounds: Normal heart sounds. No murmur heard. Pulmonary:     Effort: Pulmonary effort is normal. No respiratory distress.     Breath sounds: Normal breath sounds. No wheezing, rhonchi or rales.  Abdominal:     General: Bowel sounds are normal. There is no distension.     Palpations: Abdomen is soft. There is no mass.     Tenderness: There is no abdominal tenderness. There is no guarding or rebound.  Hernia: No hernia is present.  Musculoskeletal:     Cervical back: Normal range of motion and neck supple. No rigidity.     Right lower leg: No edema.     Left lower leg: No edema.  Lymphadenopathy:     Cervical: No cervical adenopathy.  Skin:    General: Skin is warm and dry.     Findings: No rash.  Neurological:     General: No focal deficit present.     Mental Status: She is alert. Mental status is at baseline.     Comments:  Recall 3/3 Calculation 5/5 DLROW  Psychiatric:        Mood and Affect: Mood normal.        Behavior: Behavior normal.       Results for orders placed or performed in visit on 01/19/22  Vitamin B12  Result Value Ref Range   Vitamin B-12 235 211 - 911 pg/mL  TSH  Result Value Ref Range   TSH 1.44 0.35 - 5.50 uIU/mL    Assessment & Plan:   Problem List Items Addressed This Visit     Medicare annual wellness visit, subsequent - Primary (Chronic)    I have personally reviewed the Medicare Annual Wellness questionnaire and have noted 1. The patient's medical and social history 2. Their use of alcohol, tobacco or illicit drugs 3. Their current medications and supplements 4. The patient's functional ability including ADL's, fall risks, home safety risks and hearing or visual impairment. Cognitive function has been assessed and addressed as indicated.  5. Diet and physical activity 6. Evidence for depression or mood disorders The patients weight, height, BMI have been recorded in the chart. I have made referrals, counseling and  provided education to the patient based on review of the above and I have provided the pt with a written personalized care plan for preventive services. Provider list updated.. See scanned questionairre as needed for further documentation. Reviewed preventative protocols and updated unless pt declined.       Osteopenia    Reviewed option of prolia as well as mechanism of action and need to take as close to q6 mo as possible - will price out for patient. She checked with nephrology and told ok from renal standpoint.  Prolia handout provided      HTN (hypertension)    Chronic, overall stable. Mildly elevated in office today however this morning home reading well controlled. Continue amlodipine, metoprolol, valsartan.       HLD (hyperlipidemia)    Chronic, stable. Continue atorvastatin '20mg'$  daily. The ASCVD Risk score (Arnett DK, et al., 2019) failed to calculate for the following reasons:   The 2019 ASCVD risk score is only valid for ages 65 to 38       Insomnia    Ongoing difficulty with sleep, ?OSA related - see below.       Relevant Orders   Ambulatory referral to Neurology   CKD (chronic kidney disease) stage 3, GFR 30-59 ml/min [facility])    Regularly sees nephrology - appreciate Dr Luis Abed care.       Lymphocytosis    Minimal - continue to monitor.       Bilateral hearing loss    Regularly wears hearing aides.      Erosive osteoarthritis of both hands    Appreciate rheum care. She manages with CBD salve.       Lumbar spinal stenosis    By MRI. Completed PT course, still pending PM&R eval.  Relevant Orders   Ambulatory referral to Physical Medicine Rehab   Lumbar facet arthropathy    Completed PT course, still pending PM&R eval.  She requests Dr Nelva Bush - new referral placed.       Relevant Orders   Ambulatory referral to Physical Medicine Rehab   Chronic fatigue    Ongoing for months, associated with difficulty sleeping and endorses OSA symptoms including  daytime somnolence, non-restorative sleep, loud snoring and occasional paroxysmal nocturnal dyspnea. ESS = 8. Check labs for reversible causes of fatigue. Discussed sleep medicine evaluation - referral placed.       Relevant Orders   Vitamin B12 (Completed)   TSH (Completed)   Ambulatory referral to Neurology   Other Visit Diagnoses     Need for influenza vaccination       Relevant Orders   Flu Vaccine QUAD High Dose(Fluad) (Completed)   Daytime somnolence       Relevant Orders   Ambulatory referral to Neurology        No orders of the defined types were placed in this encounter.  Orders Placed This Encounter  Procedures   Flu Vaccine QUAD High Dose(Fluad)   Vitamin B12   TSH   Ambulatory referral to Neurology    Referral Priority:   Routine    Referral Type:   Consultation    Referral Reason:   Specialty Services Required    Requested Specialty:   Neurology    Number of Visits Requested:   1   Ambulatory referral to Physical Medicine Rehab    Referral Priority:   Routine    Referral Type:   Rehabilitation    Referral Reason:   Specialty Services Required    Requested Specialty:   Physical Medicine and Rehabilitation    Number of Visits Requested:   1     Patient instructions: Flu shot today I will check in on PM&R referral ([facility]).  Consider prolia injection - see below for information. We will price out Prolia for you. If interested, check with pharmacy about new 2 shot shingles series (shingrix). Check with pharmacy about RSV as well.  Good to see you today Return in 6 months for follow up visit.  Labs today Sleep questionaire today.   Follow up plan: Return in about 3 months (around 04/20/2022) for follow up visit.  Ria Bush, MD

## 2022-01-19 NOTE — Assessment & Plan Note (Signed)
Chronic, stable. Continue atorvastatin '20mg'$  daily. The ASCVD Risk score (Arnett DK, et al., 2019) failed to calculate for the following reasons:   The 2019 ASCVD risk score is only valid for ages 10 to 53

## 2022-01-19 NOTE — Assessment & Plan Note (Addendum)
Appreciate rheum care. She manages with CBD salve.

## 2022-01-19 NOTE — Assessment & Plan Note (Addendum)
Ongoing difficulty with sleep, ?OSA related - see below.

## 2022-01-19 NOTE — Assessment & Plan Note (Signed)
Regularly wears hearing aides.

## 2022-01-20 ENCOUNTER — Encounter: Payer: Self-pay | Admitting: Family Medicine

## 2022-01-20 NOTE — Telephone Encounter (Addendum)
Noted. Will await determination

## 2022-01-20 NOTE — Telephone Encounter (Signed)
Submitted pending benefits, the issue may rise with getting this approved by insurance because she does not have osteoporosis diagnoses.

## 2022-01-21 ENCOUNTER — Ambulatory Visit: Payer: Medicare Other

## 2022-01-21 NOTE — Telephone Encounter (Signed)
Ok. Can we let pt know that insurance will not cover prolia for her? Just continue good vitamin D and regular weight bearing exercise and we will keep an eye on her bone strength with DEXA scans.

## 2022-01-21 NOTE — Telephone Encounter (Signed)
Patient is not eligible due to not having osteoporosis or or osteoporosis fractures history.

## 2022-01-22 NOTE — Telephone Encounter (Signed)
Spoke with pt relaying Dr. G's message. Pt verbalizes understanding.  

## 2022-01-26 DIAGNOSIS — M961 Postlaminectomy syndrome, not elsewhere classified: Secondary | ICD-10-CM | POA: Diagnosis not present

## 2022-01-26 DIAGNOSIS — M5416 Radiculopathy, lumbar region: Secondary | ICD-10-CM | POA: Diagnosis not present

## 2022-01-26 DIAGNOSIS — M48062 Spinal stenosis, lumbar region with neurogenic claudication: Secondary | ICD-10-CM | POA: Diagnosis not present

## 2022-01-26 DIAGNOSIS — M4316 Spondylolisthesis, lumbar region: Secondary | ICD-10-CM | POA: Diagnosis not present

## 2022-02-22 ENCOUNTER — Encounter: Payer: Self-pay | Admitting: Neurology

## 2022-02-22 ENCOUNTER — Ambulatory Visit (INDEPENDENT_AMBULATORY_CARE_PROVIDER_SITE_OTHER): Payer: Medicare Other | Admitting: Neurology

## 2022-02-22 VITALS — BP 132/65 | HR 57 | Ht 61.5 in | Wt 156.0 lb

## 2022-02-22 DIAGNOSIS — G4700 Insomnia, unspecified: Secondary | ICD-10-CM | POA: Diagnosis not present

## 2022-02-22 DIAGNOSIS — G4719 Other hypersomnia: Secondary | ICD-10-CM

## 2022-02-22 DIAGNOSIS — R351 Nocturia: Secondary | ICD-10-CM | POA: Diagnosis not present

## 2022-02-22 DIAGNOSIS — Z820 Family history of epilepsy and other diseases of the nervous system: Secondary | ICD-10-CM

## 2022-02-22 DIAGNOSIS — R0683 Snoring: Secondary | ICD-10-CM

## 2022-02-22 DIAGNOSIS — Z9189 Other specified personal risk factors, not elsewhere classified: Secondary | ICD-10-CM | POA: Diagnosis not present

## 2022-02-22 DIAGNOSIS — F439 Reaction to severe stress, unspecified: Secondary | ICD-10-CM | POA: Diagnosis not present

## 2022-02-22 NOTE — Progress Notes (Signed)
Subjective:    Patient ID: Carrie Lucas is a 81 y.o. female.  HPI    Star Age, MD, PhD [facility] [address], [address] [address] [place], [place] [address]   Dear Dr. Danise Mina,  I saw your patient, Carrie Lucas, upon your kind request in my sleep clinic today for initial consultation of her sleep disorder, in particular, concern for underlying obstructive sleep apnea and chronic difficulty initiating and maintaining sleep.  The patient is unaccompanied today.  As you know, Carrie Lucas is an 81 year old female with an underlying medical history of arthritis, basal cell carcinoma, carotid artery stenosis, chronic renal disease, degenerative lumbar disc disease, hypertension, hyperlipidemia, irritable bowel syndrome, and overweight state, who reports snoring and chronic difficulty initiating and maintaining sleep for years.  She does not wake up rested.  She does not sleep in the same bedroom with her husband, denies any witnessed apneas but has been noted to snore.  Her sister has sleep apnea and uses a CPAP machine.  Patient had a tonsillectomy as a preschooler.  She does endorse stress, particularly worrying about her daughter and grandson.  She has nocturia about 2-3 times per average night, denies recurrent nocturnal or morning headaches.  Bedtime is generally around 10 but she is unable to fall asleep until sometimes hours later.  She does use her tablet at night, she also has a TV in her bedroom but does not typically watch it at night.  She has no pets in the household, she has 1 grown daughter and a grandson and granddaughter.  She is retired, she worked at as an Web designer.  She has a rise time of 7:30 AM or 8 AM, she does take an afternoon nap for up to 30 minutes, typically around 1 PM.  She drinks caffeine in the form of coffee, typically limits herself to 1 cup in the morning, she is a non-smoker and drinks rare alcohol.  She has tried  over-the-counter medications such as Tylenol PM, over-the-counter sleep aid and melatonin, but stopped all of these medications because of chronic kidney disease.  Her Past Medical History Is Significant For: Past Medical History:  Diagnosis Date   Arthritis    R middle finger, chronic arthralgias   BCC (basal cell carcinoma of skin) 06/2015, 06/2017   supraclavicular, nasal bridge, R lower back - [name]   Carotid stenosis, asymptomatic 12/16/2014   Mild bilateral 1-39%, consider rpt [place] 1 yr (11/2014)    Chronic renal disease, stage III (HCC)    Colon polyp    DDD (degenerative disc disease), lumbar 2014   severe facet degeneration L4/5, mod spinal setnosis, mild foraminal stenosis   Dysplastic nevus 08/04/2021   To mid upper back, with severe atypia, rec re-excision ([name]) 06/2021   Erosive osteoarthritis    both hands   Headache    Hepatic steatosis 2012   mild by CT   History of melanoma 2003   face (Dr Luetta Nutting)   HLD (hyperlipidemia)    HTN (hypertension)    IBS (irritable bowel syndrome)    Insomnia    LPRD (laryngopharyngeal reflux disease)    Redmond Baseman)   Osteopenia 2011   DEXA T -1.6 hip, -1.3 forearm (12/2015)   Sialadenitis 05/2013   acute [facility] Baseman)   SK (seborrheic keratosis) 06/2020   L lateral upper back ([name])    Her Past Surgical History Is Significant For: Past Surgical History:  Procedure Laterality Date   ABI  06/2010  WNL   BACK SURGERY  1995   lumbar   BILATERAL OOPHORECTOMY  2007   ovarian cysts   BLADDER SUSPENSION     midurethral sling for SUI   CARPAL TUNNEL RELEASE Bilateral 2000   COLONOSCOPY  '8 30 12   '$ TAx1, diverticulosis, rpt 5 yrs (Outlaw)   COLONOSCOPY  03/2016   WNL (Outlaw)   DEXA  2011   mild osteopenia (T -1.2 R femur)   ESI  2014   L L4 transforaminal   ESOPHAGOGASTRODUODENOSCOPY  12/24/2010   reflux esophagitis   MOHS SURGERY  2016   BCC   RADIAL KERATOTOMY Left    TONSILLECTOMY  Childhood   VAGINAL HYSTERECTOMY      bladder drop    Her Family History Is Significant For: Family History  Problem Relation Age of Onset   Osteoarthritis Mother    Pulmonary fibrosis Mother 30   Arthritis Mother    Thyroid disease Mother    Diabetes Father    Hypertension Father    Stroke Father 33   Hyperlipidemia Father    Cancer Sister        breast   CAD Sister    Cancer Sister        breast   Heart attack Sister    Thyroid disease Sister    Stroke Sister    Cervical cancer Sister    Graves' disease Sister    Sleep apnea Sister    Osteoarthritis Maternal Grandmother     Her Social History Is Significant For: Social History   Socioeconomic History   Marital status: Married    Spouse name: Not on file   Number of children: 1   Years of education: Not on file   Highest education level: Some college, no degree  Occupational History   Occupation: retired  Tobacco Use   Smoking status: Never   Smokeless tobacco: Never  Vaping Use   Vaping Use: Never used  Substance and Sexual Activity   Alcohol use: No    Alcohol/week: 0.0 standard drinks of alcohol   Drug use: No   Sexual activity: Yes    Birth control/protection: Surgical  Other Topics Concern   Not on file  Social History Narrative   Lives with husband   Activity: Chiropractor, 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 Presenter, broadcasting   Derm - Dr. Elvera Lennox (was Dr Sherrye Payor but he retired)   Dentist - Dr. Claudina Lick   ENT - Dr Redmond Baseman   Nephrologist- Dr Royce Macadamia    Social Determinants of Health   Financial Resource Strain: Low Risk  (01/02/2019)   Overall Financial Resource Strain (CARDIA)    Difficulty of Paying Living Expenses: Not hard at all  Food Insecurity: No Food Insecurity (01/02/2019)   Hunger Vital Sign    Worried About Running Out of Food in the Last Year: Never true    March ARB  in the Last Year: Never true  Transportation Needs: No Transportation Needs (01/02/2019)   PRAPARE - Hydrologist (Medical): No    Lack of Transportation (Non-Medical): No  Physical Activity: Insufficiently Active (01/02/2019)   Exercise Vital Sign    Days of Exercise per Week: 3 days    Minutes of Exercise per Session: 20 min  Stress: No  Stress Concern Present (01/02/2019)   [place]    Feeling of Stress : Not at all  Social Connections: Not on file    Her Allergies Are:  Allergies  Allergen Reactions   Naproxen Itching  :   Her Current Medications Are:  Outpatient Encounter Medications as of 02/22/2022  Medication Sig   acetaminophen (TYLENOL) 650 MG CR tablet Take 1 tablet (650 mg total) by mouth 3 (three) times daily as needed for pain.   amLODipine (NORVASC) 5 MG tablet Take 1 tablet by mouth once daily   aspirin EC 81 MG tablet Take 1 tablet (81 mg total) by mouth every Monday, Wednesday, and Friday.   atorvastatin (LIPITOR) 20 MG tablet Take 1 tablet by mouth once daily   Cholecalciferol (VITAMIN D3) 1000 units CAPS Take 1 capsule by mouth daily.   cyanocobalamin 1000 MCG tablet Take 1,000 mcg by mouth daily.   metoprolol succinate (TOPROL-XL) 25 MG 24 hr tablet Take 1/2 (one-half) tablet by mouth once daily   valsartan (DIOVAN) 320 MG tablet Take 1 tablet by mouth once daily   No facility-administered encounter medications on file as of 02/22/2022.  :   Review of Systems:  Out of a complete 14 point review of systems, all are reviewed and negative with the exception of these symptoms as listed below:  Review of Systems  Neurological:        Patient is here alone for a sleep consult. She endorses snoring, difficulty falling asleep, difficulty staying asleep. She lies down around 1030 but if she has trouble falling asleep she will play on her tablet or watch television until she  feels she's ready to fall asleep. She will then lie back down and still might lie there for 2 hours. If she does fall asleep she wakes up to use the bathroom and will have only slept maybe 1.5 hours. Denies every being tested for OSA in the past. ESS 6 and FSS 41.     Objective:  Neurological Exam  Physical Exam Physical Examination:   Vitals:   02/22/22 1433  BP: 132/65  Pulse: (!) 57    General Examination: The patient is a very pleasant 81 y.o. female in no acute distress. She appears well-developed and well-nourished and well groomed.   HEENT: Normocephalic, atraumatic, pupils are equal, round and reactive to light, extraocular tracking is good without limitation to gaze excursion or nystagmus noted. Hearing is grossly intact. Face is symmetric with normal facial animation. Speech is clear with no dysarthria noted. There is no hypophonia. There is no lip, neck/head, jaw or voice tremor. Neck is supple with full range of passive and active motion. There are no carotid bruits on auscultation. Oropharynx exam reveals: mild mouth dryness, good dental hygiene and mild airway crowding, due to small airway entry and redundant soft palate, Mallampati class III, neck circumference 14-1/2 inches, mild overbite noted.  Tongue protrudes centrally and palate elevates symmetrically.  Chest: Clear to auscultation without wheezing, rhonchi or crackles noted.  Heart: S1+S2+0, regular and normal without murmurs, rubs or gallops noted.   Abdomen: Soft, non-tender and non-distended.  Extremities: There is no pitting edema in the distal lower extremities bilaterally.   Skin: Warm and dry without trophic changes noted.   Musculoskeletal: exam reveals no obvious joint deformities.   Neurologically:  Mental status: The patient is awake, alert and oriented in all 4 spheres. Her immediate and remote memory, attention, language skills and fund  of knowledge are appropriate. There is no evidence of aphasia,  agnosia, apraxia or anomia. Speech is clear with normal prosody and enunciation. Thought process is linear. Mood is normal and affect is normal.  Cranial nerves II - XII are as described above under HEENT exam.  Motor exam: Normal bulk, strength and tone is noted. There is no obvious action or resting tremor.  Fine motor skills and coordination: grossly intact.  Cerebellar testing: No dysmetria or intention tremor. There is no truncal or gait ataxia.  Sensory exam: intact to light touch in the upper and lower extremities.  Gait, station and balance: She stands easily. No veering to one side is noted. No leaning to one side is noted. Posture is age-appropriate and stance is narrow based. Gait shows normal stride length and normal pace. No problems turning are noted.   Assessment and Plan:  In summary, BRACHA FRANKOWSKI is a very pleasant 81 y.o.-year old female with an underlying medical history of arthritis, basal cell carcinoma, carotid artery stenosis, chronic renal disease, degenerative lumbar disc disease, hypertension, hyperlipidemia, irritable bowel syndrome, and overweight state, whose history and physical exam are concerning for sleep disordered breathing, supporting a current working diagnosis of unspecified sleep apnea, with the main differential diagnoses of obstructive sleep apnea (OSA) versus upper airway resistance syndrome (UARS) versus central sleep apnea (CSA), or mixed sleep apnea. A laboratory attended sleep study is considered gold standard for evaluation of sleep disordered breathing and is recommended at this time and clinically justified.   I had a long chat with the patient about my findings and the diagnosis of sleep apnea, particularly OSA, its prognosis and treatment options. We talked about medical/conservative treatments, surgical interventions and non-pharmacological approaches for symptom control. I explained, in particular, the risks and ramifications of untreated moderate to  severe OSA, especially with respect to developing cardiovascular disease down the road, including congestive heart failure (CHF), difficult to treat hypertension, cardiac arrhythmias (particularly A-fib), neurovascular complications including TIA, stroke and dementia. Even type 2 diabetes has, in part, been linked to untreated OSA. Symptoms of untreated OSA may include (but may not be limited to) daytime sleepiness, nocturia (i.e. frequent nighttime urination), memory problems, mood irritability and suboptimally controlled or worsening mood disorder such as depression and/or anxiety, lack of energy, lack of motivation, physical discomfort, as well as recurrent headaches, especially morning or nocturnal headaches. We talked about the importance of maintaining a healthy lifestyle and striving for healthy weight.  In addition, we talked about the importance of striving for and maintaining good sleep hygiene. I recommended the following at this time: sleep study.  I outlined the differences between a laboratory attended sleep study which is considered more comprehensive and accurate over the option of a home sleep test (HST); the latter may lead to underestimation of sleep disordered breathing in some instances and does not help with diagnosing upper airway resistance syndrome and is not accurate enough to diagnose primary central sleep apnea typically. I explained the different sleep test procedures to the patient in detail and also outlined possible surgical and non-surgical treatment options of OSA, including the use of a pressure airway pressure (PAP) device (ie CPAP, AutoPAP/APAP or BiPAP in certain circumstances), a custom-made dental device (aka oral appliance, which would require a referral to a specialist dentist or orthodontist typically, and is generally speaking not considered a good choice for patients with full dentures or edentulous state), upper airway surgical options, such as traditional UPPP (which  is not  considered a first-line treatment) or the Inspire device (hypoglossal nerve stimulator, which would involve a referral for consultation with an ENT surgeon, after careful selection, following inclusion criteria). I explained the PAP treatment option to the patient in detail, as this is generally considered first-line treatment.  The patient indicated that she would be willing to try PAP therapy, if the need arises. I explained the importance of being compliant with PAP treatment, not only for insurance purposes but primarily to improve patient's symptoms symptoms, and for the patient's long term health benefit, including to reduce Her cardiovascular risks longer-term.    We will pick up our discussion about the next steps and treatment options after testing.  We will keep her posted as to the test results by phone call and/or MyChart messaging where possible.  We will plan to follow-up in sleep clinic accordingly as well.  I answered all her questions today and the patient was in agreement.   I encouraged her to call with any interim questions, concerns, problems or updates or email [place] through [place].  Generally speaking, sleep test authorizations may take up to 2 weeks, sometimes less, sometimes longer, the patient is encouraged to get in touch with [place] if they do not hear back from the sleep lab staff directly within the next 2 weeks.  Thank you very much for allowing me to participate in the care of this nice patient. If I can be of any further assistance to you please do not hesitate to call me at [phone].  Sincerely,   Star Age, MD, PhD

## 2022-02-22 NOTE — Patient Instructions (Signed)

## 2022-03-02 ENCOUNTER — Telehealth: Payer: Self-pay | Admitting: Neurology

## 2022-03-02 NOTE — Telephone Encounter (Signed)
NPSG- no auth req'd (pt chose inlab)   Patient is scheduled at [facility] for 05/10/22 at 9 pm.  Mailed packet to the patient.

## 2022-03-11 ENCOUNTER — Other Ambulatory Visit: Payer: Self-pay | Admitting: Family Medicine

## 2022-03-11 DIAGNOSIS — I10 Essential (primary) hypertension: Secondary | ICD-10-CM

## 2022-03-27 ENCOUNTER — Other Ambulatory Visit: Payer: Self-pay | Admitting: Family Medicine

## 2022-03-27 DIAGNOSIS — E782 Mixed hyperlipidemia: Secondary | ICD-10-CM

## 2022-04-08 ENCOUNTER — Other Ambulatory Visit: Payer: Self-pay | Admitting: Family Medicine

## 2022-04-08 DIAGNOSIS — I10 Essential (primary) hypertension: Secondary | ICD-10-CM

## 2022-04-20 ENCOUNTER — Ambulatory Visit (INDEPENDENT_AMBULATORY_CARE_PROVIDER_SITE_OTHER): Payer: Medicare Other | Admitting: Family Medicine

## 2022-04-20 ENCOUNTER — Telehealth: Payer: Self-pay | Admitting: Family Medicine

## 2022-04-20 ENCOUNTER — Encounter: Payer: Self-pay | Admitting: Family Medicine

## 2022-04-20 VITALS — BP 162/64 | HR 66 | Temp 97.6°F | Ht 61.5 in | Wt 156.2 lb

## 2022-04-20 DIAGNOSIS — M8580 Other specified disorders of bone density and structure, unspecified site: Secondary | ICD-10-CM | POA: Diagnosis not present

## 2022-04-20 DIAGNOSIS — H5711 Ocular pain, right eye: Secondary | ICD-10-CM | POA: Insufficient documentation

## 2022-04-20 DIAGNOSIS — I10 Essential (primary) hypertension: Secondary | ICD-10-CM

## 2022-04-20 DIAGNOSIS — M5136 Other intervertebral disc degeneration, lumbar region: Secondary | ICD-10-CM

## 2022-04-20 DIAGNOSIS — N1832 Chronic kidney disease, stage 3b: Secondary | ICD-10-CM

## 2022-04-20 MED ORDER — TRAMADOL HCL 50 MG PO TABS: 50.0000 mg | ORAL_TABLET | Freq: Two times a day (BID) | ORAL | 0 refills | Status: DC | PRN

## 2022-04-20 NOTE — Progress Notes (Signed)
Patient ID: Carrie Lucas, female    DOB: April 20, 1941, 81 y.o.   MRN: [id number]  This visit was conducted in person.  BP (!) 162/64   Pulse 66   Temp 97.6 F (36.4 C) (Temporal)   Ht 5' 1.5" (1.562 m)   Wt 156 lb 3.2 oz (70.9 kg)   SpO2 96%   BMI 29.04 kg/m   170/68 on retesting  CC: 3 mo f/u visit  Subjective:   HPI: Carrie Lucas is a 81 y.o. female presenting on 04/20/2022 for Follow-up (Here for 3 mo f/u.)   Known CKD now followed by nephrology (Carrie Lucas).   Yesterday she scratched her R eye - staying painful. She has eye doctor appointment tomorrow.   HTN - Compliant with current antihypertensive regimen of amlodipine '5mg'$  daily, valsartan '320mg'$  daily, toprol XL 12.'5mg'$  daily. Does check blood pressures at home: normally running well. No low blood pressure readings or symptoms of dizziness/syncope. Denies HA, vision changes, CP/tightness, SOB, leg swelling.    Over the past month she notes worsening lower back pain with bilateral sciatica - she's increased tramadol use due to this. Last saw Carrie Lucas 01/2022. Planning to return to see him to discuss possible repeat injection. She notes she wakes up at night with back pain when supine. Requests tramadol refilled.   Saw neurology 01/2022 for evaluation for OSA - plan was sleep study - this has been scheduled for [facility]/10/2022 [facility].   Insurance did not cover Prolia. She will continue daily vitamin D intake and regular weight bearing exercises.      Relevant past medical, surgical, family and social history reviewed and updated as indicated. Interim medical history since our last visit reviewed. Allergies and medications reviewed and updated. Outpatient Medications Prior to Visit  Medication Sig Dispense Refill   acetaminophen (TYLENOL) 650 MG CR tablet Take 1 tablet (650 mg total) by mouth 3 (three) times daily as needed for pain.     aspirin EC 81 MG tablet Take 1 tablet (81 mg total) by mouth every Monday, Wednesday, and  Friday.     atorvastatin (LIPITOR) 20 MG tablet Take 1 tablet by mouth once daily 90 tablet 3   Cholecalciferol (VITAMIN D3) 1000 units CAPS Take 1 capsule by mouth daily.     cyanocobalamin 1000 MCG tablet Take 1,000 mcg by mouth daily.     metoprolol succinate (TOPROL-XL) 25 MG 24 hr tablet Take 1/2 (one-half) tablet by mouth once daily 45 tablet 3   valsartan (DIOVAN) 320 MG tablet Take 1 tablet by mouth once daily 90 tablet 3   amLODipine (NORVASC) 5 MG tablet Take 1 tablet by mouth once daily 90 tablet 3   amLODipine (NORVASC) 5 MG tablet Take 2 tablets (10 mg total) by mouth daily.     No facility-administered medications prior to visit.     Per HPI unless specifically indicated in ROS section below Review of Systems  Objective:  BP (!) 162/64   Pulse 66   Temp 97.6 F (36.4 C) (Temporal)   Ht 5' 1.5" (1.562 m)   Wt 156 lb 3.2 oz (70.9 kg)   SpO2 96%   BMI 29.04 kg/m   Wt Readings from Last 3 Encounters:  04/20/22 156 lb 3.2 oz (70.9 kg)  02/22/22 156 lb (70.8 kg)  01/19/22 155 lb 4 oz (70.4 kg)      Physical Exam Vitals and nursing note reviewed.  Constitutional:      Appearance: Normal  appearance. She is not ill-appearing.  Eyes:     Extraocular Movements: Extraocular movements intact.     Pupils: Pupils are equal, round, and reactive to light.     Comments: No conjunctival injection  Cardiovascular:     Rate and Rhythm: Normal rate and regular rhythm.     Pulses: Normal pulses.     Heart sounds: Normal heart sounds. No murmur heard. Pulmonary:     Effort: Pulmonary effort is normal. No respiratory distress.     Breath sounds: Normal breath sounds. No wheezing, rhonchi or rales.  Musculoskeletal:     Right lower leg: No edema.     Left lower leg: No edema.  Skin:    General: Skin is warm and dry.     Findings: No rash.  Neurological:     Mental Status: She is alert.  Psychiatric:        Mood and Affect: Mood normal.        Behavior: Behavior normal.        Results for orders placed or performed in visit on 01/19/22  Vitamin B12  Result Value Ref Range   Vitamin B-12 235 211 - 911 pg/mL  TSH  Result Value Ref Range   TSH 1.44 0.35 - 5.50 uIU/mL    Assessment & Plan:   Problem List Items Addressed This Visit     DDD (degenerative disc disease), lumbar    She is planning to return to Carrie Lucas to discuss possible steroid injection      Relevant Medications   traMADol (ULTRAM) 50 MG tablet   Osteopenia with high risk of fracture    Insurance did not cover Prolia injections. She continues calcium and weight bearing exercises.       HTN (hypertension) - Primary    Chronic, deteriorated, above goal despite current 3 drug regimen. Anticipate multifactorial including increased pain (eye and back) and stress of caring for husband.  Will increase amlodipine to '10mg'$  daily - monitoring for ankle swelling side effect (which previously occurred). Continue metoprolol, valsartan.  Discussed lowering salt/sodium in diet.       Relevant Medications   amLODipine (NORVASC) 5 MG tablet   CKD (chronic kidney disease) stage 3, GFR 30-59 ml/min [facility])    Appreciate Carrie Lucas care.      Acute right eye pain    Scratched right eye yesterday with residual discomfort/tenderness. Vision preserved. Offered fluorescein stain, she declines as she's has upcoming eye doctor appt ealry in the morning. Recommend lubricating eye drops in interim.         Meds ordered this encounter  Medications   traMADol (ULTRAM) 50 MG tablet    Sig: Take 1 tablet (50 mg total) by mouth 2 (two) times daily as needed for moderate pain (sedation precautions).    Dispense:  30 tablet    Refill:  0   No orders of the defined types were placed in this encounter.    Patient Instructions  Recommend lubricating eye drops until you see eye doctor tomorrow.  BP staying high - increase amlodipine to '10mg'$  daily in addition to metoprolol and valsartan.  Start monitoring  blood pressures at home.  Let me know if BP not improved with this.  Return in 2 months for follow up blood pressure  Follow up plan: Return in about 2 months (around 06/21/2022) for follow up visit.  Ria Bush, MD

## 2022-04-20 NOTE — Patient Instructions (Addendum)
Recommend lubricating eye drops until you see eye doctor tomorrow.  BP staying high - increase amlodipine to '10mg'$  daily in addition to metoprolol and valsartan.  Start monitoring blood pressures at home.  Let me know if BP not improved with this.  Return in 2 months for follow up blood pressure

## 2022-04-20 NOTE — Assessment & Plan Note (Addendum)
Chronic, deteriorated, above goal despite current 3 drug regimen. Anticipate multifactorial including increased pain (eye and back) and stress of caring for husband.  Will increase amlodipine to '10mg'$  daily - monitoring for ankle swelling side effect (which previously occurred). Continue metoprolol, valsartan.  Discussed lowering salt/sodium in diet.

## 2022-04-20 NOTE — Assessment & Plan Note (Signed)
Insurance did not cover Prolia injections. She continues calcium and weight bearing exercises.

## 2022-04-20 NOTE — Assessment & Plan Note (Signed)
She is planning to return to Dr Nelva Bush to discuss possible steroid injection

## 2022-04-20 NOTE — Assessment & Plan Note (Signed)
Scratched right eye yesterday with residual discomfort/tenderness. Vision preserved. Offered fluorescein stain, she declines as she's has upcoming eye doctor appt ealry in the morning. Recommend lubricating eye drops in interim.

## 2022-04-20 NOTE — Assessment & Plan Note (Signed)
Appreciate Dr Luis Abed care.

## 2022-04-20 NOTE — Telephone Encounter (Signed)
Rtn Mary's call. Stanton Kidney is asking for dx for tramadol rx. Notified her, per today's OV notes, it is prescribed for DDD (degenerative disc disease). She documented info and will fill for pt.

## 2022-04-20 NOTE — Telephone Encounter (Signed)
Carrie Lucas from [facility] called in and had some questions in regards to the traMADol (ULTRAM) 50 MG tablet. She can be reached at [phone]. Thank you!

## 2022-04-21 DIAGNOSIS — H52202 Unspecified astigmatism, left eye: Secondary | ICD-10-CM | POA: Diagnosis not present

## 2022-04-21 DIAGNOSIS — Z961 Presence of intraocular lens: Secondary | ICD-10-CM | POA: Diagnosis not present

## 2022-04-21 DIAGNOSIS — H11003 Unspecified pterygium of eye, bilateral: Secondary | ICD-10-CM | POA: Diagnosis not present

## 2022-04-28 DIAGNOSIS — M5416 Radiculopathy, lumbar region: Secondary | ICD-10-CM | POA: Diagnosis not present

## 2022-05-05 ENCOUNTER — Encounter: Payer: Self-pay | Admitting: Family Medicine

## 2022-05-05 NOTE — Telephone Encounter (Signed)
Spoke to patient by telephone and was advised that she has a new blood pressure machine. Patient state her blood pressure today was 35/63 HR 48,  05/04/22 108/51 HR 48,  05/03/22 121/59 HR 50,  05/02/22 HR 134/61 HR 50.  Patient stated that her feet are swelling but the swelling does go down some overnight. Patient stated when her feet swell the top of them hurts.. Patient stated that she is just dizzy when she stands up only.  Patient was advised to make sure that she stands up slowly and does not move until she feels stable. Patient stated that she has had a faint steady headache for several weeks. Patient denies any other symptoms. Patient was given ER precautions and she verbalized understanding.

## 2022-05-05 NOTE — Telephone Encounter (Signed)
Recommend drop amlodipine back to '5mg'$  daily.  Continue valsartan '320mg'$  daily, toprol XL '25mg'$  1/2 tab daily.   How did she do with hydrochlorothiazide previously? May restart low dose diuretic such as hctz or spironolactone. When is next appt with [place] [facility]rologist?    Pulse Readings from Last 3 Encounters:  04/20/22 66  02/22/22 (!) 57  01/19/22 65

## 2022-05-06 NOTE — Telephone Encounter (Signed)
Spoke with pt relaying Dr. Synthia Innocent message. Pt verbalizes understanding but states she does not remember how HCTZ affected her previously. She agrees to either med Dr. Darnell Level chooses.   Also, her next [facility] appt is 05/2022.

## 2022-05-10 ENCOUNTER — Telehealth: Payer: Self-pay

## 2022-05-10 MED ORDER — HYDROCHLOROTHIAZIDE 12.5 MG PO CAPS: 12.5000 mg | ORAL_CAPSULE | Freq: Every day | ORAL | 6 refills | Status: DC

## 2022-05-10 NOTE — Telephone Encounter (Signed)
I sent in hctz 12.'5mg'$  to take daily.

## 2022-05-10 NOTE — Telephone Encounter (Signed)
The pt had a sleep study on 05/10/22 @ 9:00 pm. I called the pt to see if she was going to make it to her appt. She said she called on Friday 05/07/22 and cancelled the appt. She said her husband just had a stroke and she does not have anyone to stay with him. She will call back to reschedule once she is able to. I informed her I would cancel sleep study.

## 2022-05-10 NOTE — Addendum Note (Signed)
Addended by: Ria Bush on: 05/10/2022 08:14 AM   Modules accepted: Orders

## 2022-05-11 ENCOUNTER — Other Ambulatory Visit: Payer: Self-pay | Admitting: Family Medicine

## 2022-05-11 DIAGNOSIS — Z1231 Encounter for screening mammogram for malignant neoplasm of breast: Secondary | ICD-10-CM

## 2022-05-11 NOTE — Telephone Encounter (Signed)
Noted, thank you

## 2022-06-08 DIAGNOSIS — M5416 Radiculopathy, lumbar region: Secondary | ICD-10-CM | POA: Diagnosis not present

## 2022-06-21 ENCOUNTER — Ambulatory Visit (INDEPENDENT_AMBULATORY_CARE_PROVIDER_SITE_OTHER): Payer: Medicare Other | Admitting: Family Medicine

## 2022-06-21 ENCOUNTER — Encounter: Payer: Self-pay | Admitting: Family Medicine

## 2022-06-21 VITALS — BP 130/40 | HR 70 | Temp 97.7°F | Ht 61.5 in | Wt 157.4 lb

## 2022-06-21 DIAGNOSIS — I10 Essential (primary) hypertension: Secondary | ICD-10-CM | POA: Diagnosis not present

## 2022-06-21 DIAGNOSIS — N1832 Chronic kidney disease, stage 3b: Secondary | ICD-10-CM

## 2022-06-21 DIAGNOSIS — E538 Deficiency of other specified B group vitamins: Secondary | ICD-10-CM | POA: Diagnosis not present

## 2022-06-21 DIAGNOSIS — G6289 Other specified polyneuropathies: Secondary | ICD-10-CM

## 2022-06-21 LAB — RENAL FUNCTION PANEL
Albumin: 4.1 g/dL (ref 3.5–5.2)
BUN: 30 mg/dL — ABNORMAL HIGH (ref 6–23)
CO2: 22 mEq/L (ref 19–32)
Calcium: 9.8 mg/dL (ref 8.4–10.5)
Chloride: 105 mEq/L (ref 96–112)
Creatinine, Ser: 1.44 mg/dL — ABNORMAL HIGH (ref 0.40–1.20)
GFR: 34 mL/min — ABNORMAL LOW (ref >60.00)
Glucose, Bld: 108 mg/dL — ABNORMAL HIGH (ref 70–99)
Phosphorus: 3.9 mg/dL (ref 2.3–4.6)
Potassium: 4.6 mEq/L (ref 3.5–5.1)
Sodium: 137 mEq/L (ref 135–145)

## 2022-06-21 LAB — VITAMIN B12: Vitamin B-12: 1130 pg/mL — ABNORMAL HIGH (ref 211–911)

## 2022-06-21 NOTE — Assessment & Plan Note (Signed)
Ongoing neuropathy - despite starting b12 replacement and receiving ESI to spine by Dr Nelva Bush - she will follow up with PM&R.

## 2022-06-21 NOTE — Assessment & Plan Note (Signed)
Chronic, uncontrolled. Marked fluctuations in blood pressure control with both hyper and hypotensive readings, she is symptomatic with this. Will stop hctz, continue other 3 antihypertensives. Wide fluctuations raise question of autonomic instability, no other parkinson symptoms. Will monitor orthostasis.

## 2022-06-21 NOTE — Assessment & Plan Note (Signed)
Update renal panel today.  Last saw nephrologist Dr Royce Macadamia 11/2021 - I don't have access to that note - will request today.  Significant BP fluctuation as per below.

## 2022-06-21 NOTE — Progress Notes (Signed)
Patient ID: Carrie Lucas, female    DOB: [birth year], 82 y.o.   MRN: [id number]  This visit was conducted in person.  BP (!) 130/40 (BP Location: Right Arm, Cuff Size: Large)   Pulse 70   Temp 97.7 F (36.5 C) (Temporal)   Ht 5' 1.5" (1.562 m)   Wt 157 lb 6 oz (71.4 kg)   SpO2 95%   BMI 29.25 kg/m   Orthostatic VS for the past 24 hrs (Last 3 readings):  BP- Lying BP- Standing at 3 minutes  06/21/22 1014 -- 112/48  06/21/22 1009 118/50 --     CC: 2 mo HTN f/u visit  Subjective:   HPI: Carrie Lucas is a 82 y.o. female presenting on 06/21/2022 for Medical Management of Chronic Issues (Here for 2 mo HTN f/u. Pt brought home BP monitor to compare. Reading in office today- 146/68. )   Sees nephrologist Dr Harrie Jeans - seen last August 2023.   HTN - Compliant with current antihypertensive regimen of amlodipine '5mg'$  daily ('10mg'$  dose caused leg swelling), hctz 12.'5mg'$  (recent addition), valsartan '320mg'$  daily, and toprol XL 12.'5mg'$  daily. Does check blood pressures at home: marked fluctuations in blood pressure ranging from 70s/50s to 150s/70s. She feels dizzy/lightheaded when she has low readings. Denies HA, vision changes, CP/tightness, SOB. No diet changes, feels she stays well hydrated with plenty of water (48oz water daily). No orthostatic symptoms. She keeps headache to posterior head for last few months.   Denies tremors, shaking, stiffness/rigidity, abnormal sleep movements, memory difficulty.   Sleep study scheduled for last month had to be cancelled as husband had stroke and she needed to stay with him. Needs to call and reschedule.      Relevant past medical, surgical, family and social history reviewed and updated as indicated. Interim medical history since our last visit reviewed. Allergies and medications reviewed and updated. Outpatient Medications Prior to Visit  Medication Sig Dispense Refill   acetaminophen (TYLENOL) 650 MG CR tablet Take 1 tablet (650 mg total) by  mouth 3 (three) times daily as needed for pain.     aspirin EC 81 MG tablet Take 1 tablet (81 mg total) by mouth every Monday, Wednesday, and Friday.     atorvastatin (LIPITOR) 20 MG tablet Take 1 tablet by mouth once daily 90 tablet 3   Cholecalciferol (VITAMIN D3) 1000 units CAPS Take 1 capsule by mouth daily.     cyanocobalamin 1000 MCG tablet Take 1,000 mcg by mouth daily.     metoprolol succinate (TOPROL-XL) 25 MG 24 hr tablet Take 1/2 (one-half) tablet by mouth once daily 45 tablet 3   traMADol (ULTRAM) 50 MG tablet Take 1 tablet (50 mg total) by mouth 2 (two) times daily as needed for moderate pain (sedation precautions). 30 tablet 0   valsartan (DIOVAN) 320 MG tablet Take 1 tablet by mouth once daily 90 tablet 3   amLODipine (NORVASC) 5 MG tablet Take 2 tablets (10 mg total) by mouth daily.     hydrochlorothiazide (MICROZIDE) 12.5 MG capsule Take 1 capsule (12.5 mg total) by mouth daily. 30 capsule 6   amLODipine (NORVASC) 5 MG tablet Take 1 tablet (5 mg total) by mouth daily.     No facility-administered medications prior to visit.     Per HPI unless specifically indicated in ROS section below  Review of Systems  Objective:  BP (!) 130/40 (BP Location: Right Arm, Cuff Size: Large)   Pulse 70   Temp 97.7  F (36.5 C) (Temporal)   Ht 5' 1.5" (1.562 m)   Wt 157 lb 6 oz (71.4 kg)   SpO2 95%   BMI 29.25 kg/m   Wt Readings from Last 3 Encounters:  06/21/22 157 lb 6 oz (71.4 kg)  04/20/22 156 lb 3.2 oz (70.9 kg)  02/22/22 156 lb (70.8 kg)      Physical Exam Vitals and nursing note reviewed.  Constitutional:      Appearance: Normal appearance. She is not ill-appearing.  HENT:     Head: Normocephalic and atraumatic.     Mouth/Throat:     Mouth: Mucous membranes are moist.     Pharynx: Oropharynx is clear. No oropharyngeal exudate or posterior oropharyngeal erythema.  Eyes:     Extraocular Movements: Extraocular movements intact.     Conjunctiva/sclera: Conjunctivae normal.      Pupils: Pupils are equal, round, and reactive to light.  Cardiovascular:     Rate and Rhythm: Normal rate and regular rhythm.     Pulses: Normal pulses.     Heart sounds: Normal heart sounds. No murmur heard. Pulmonary:     Effort: Pulmonary effort is normal. No respiratory distress.     Breath sounds: Normal breath sounds. No wheezing, rhonchi or rales.  Musculoskeletal:     Right lower leg: No edema.     Left lower leg: No edema.  Skin:    General: Skin is warm and dry.     Findings: No rash.  Neurological:     Mental Status: She is alert.  Psychiatric:        Mood and Affect: Mood normal.        Behavior: Behavior normal.       Results for orders placed or performed in visit on 01/19/22  Vitamin B12  Result Value Ref Range   Vitamin B-12 235 211 - 911 pg/mL  TSH  Result Value Ref Range   TSH 1.44 0.35 - 5.50 uIU/mL   Lab Results  Component Value Date   CREATININE 1.24 (H) 01/08/2022   BUN 22 01/08/2022   NA 139 01/08/2022   K 4.5 01/08/2022   CL 106 01/08/2022   CO2 23 01/08/2022   Assessment & Plan:   Problem List Items Addressed This Visit     HTN (hypertension) - Primary    Chronic, uncontrolled. Marked fluctuations in blood pressure control with both hyper and hypotensive readings, she is symptomatic with this. Will stop hctz, continue other 3 antihypertensives. Wide fluctuations raise question of autonomic instability, no other parkinson symptoms. Will monitor orthostasis.       Relevant Medications   amLODipine (NORVASC) 5 MG tablet   CKD (chronic kidney disease) stage 3, GFR 30-59 ml/min (HCC)    Update renal panel today.  Last saw nephrologist Dr Royce Macadamia 11/2021 - I don't have access to that note - will request today.  Significant BP fluctuation as per below.       Relevant Orders   Renal function panel   Peripheral neuropathy    Ongoing neuropathy - despite starting b12 replacement and receiving ESI to spine by Dr Nelva Bush - she will follow up  with PM&R.       Low serum vitamin B12    She is now taking 1068mg daily - update level.       Relevant Orders   Vitamin B12     No orders of the defined types were placed in this encounter.   Orders Placed This Encounter  Procedures  Renal function panel   Vitamin B12    Patient Instructions  Update kidney function today with blood work Call and reschedule sleep study.  We will requests latest kidney office note 11/2021 (Dr Harrie Jeans at [facility]).  Stop hydrochlorothiazide. Continue amlodipine '5mg'$  and valsartan '320mg'$  daily with 1/2 tablet of metoprolol.  Continue to stay well hydrated.  Return in 6 months for next wellness visit.   Follow up plan: Return in about 6 months (around 12/20/2022), or if symptoms worsen or fail to improve, for medicare wellness visit.  Ria Bush, MD

## 2022-06-21 NOTE — Patient Instructions (Addendum)
Update kidney function today with blood work Call and reschedule sleep study.  We will requests latest kidney office note 11/2021 (Dr Harrie Jeans at [facility]).  Stop hydrochlorothiazide. Continue amlodipine '5mg'$  and valsartan '320mg'$  daily with 1/2 tablet of metoprolol.  Continue to stay well hydrated.  Return in 6 months for next wellness visit.

## 2022-06-21 NOTE — Assessment & Plan Note (Addendum)
She is now taking 103mg daily - update level.

## 2022-06-22 ENCOUNTER — Other Ambulatory Visit: Payer: Self-pay | Admitting: Family Medicine

## 2022-06-22 MED ORDER — CYANOCOBALAMIN 1000 MCG PO TABS: 1000.0000 ug | ORAL_TABLET | ORAL | Status: AC

## 2022-06-29 NOTE — Telephone Encounter (Signed)
Patient left a voicemail on my phone 06/28/22 about wanting to r/s her SS.  I called her back but she didn't pick up I left a voicemail for her to call me back to r/s.

## 2022-06-30 ENCOUNTER — Ambulatory Visit
Admission: RE | Admit: 2022-06-30 | Discharge: 2022-06-30 | Disposition: A | Discharge status: Home / Self Care | Payer: Medicare Other | Source: Ambulatory Visit | Attending: Family Medicine | Admitting: Family Medicine

## 2022-06-30 DIAGNOSIS — Z1231 Encounter for screening mammogram for malignant neoplasm of breast: Secondary | ICD-10-CM

## 2022-06-30 NOTE — Telephone Encounter (Signed)
Patient returned my call.   [employer]- Medicare/aarp no auth req.  Patient is r/s for 08/29/22 at 9 pm.  Mailed new packet to the patient.

## 2022-07-01 DIAGNOSIS — M5416 Radiculopathy, lumbar region: Secondary | ICD-10-CM | POA: Diagnosis not present

## 2022-07-23 ENCOUNTER — Encounter: Payer: Self-pay | Admitting: Family Medicine

## 2022-08-06 DIAGNOSIS — L814 Other melanin hyperpigmentation: Secondary | ICD-10-CM | POA: Diagnosis not present

## 2022-08-06 DIAGNOSIS — D2271 Melanocytic nevi of right lower limb, including hip: Secondary | ICD-10-CM | POA: Diagnosis not present

## 2022-08-06 DIAGNOSIS — C44519 Basal cell carcinoma of skin of other part of trunk: Secondary | ICD-10-CM | POA: Diagnosis not present

## 2022-08-06 DIAGNOSIS — D225 Melanocytic nevi of trunk: Secondary | ICD-10-CM | POA: Diagnosis not present

## 2022-08-06 DIAGNOSIS — D2262 Melanocytic nevi of left upper limb, including shoulder: Secondary | ICD-10-CM | POA: Diagnosis not present

## 2022-08-06 DIAGNOSIS — D2272 Melanocytic nevi of left lower limb, including hip: Secondary | ICD-10-CM | POA: Diagnosis not present

## 2022-08-06 DIAGNOSIS — D1801 Hemangioma of skin and subcutaneous tissue: Secondary | ICD-10-CM | POA: Diagnosis not present

## 2022-08-06 DIAGNOSIS — Z85828 Personal history of other malignant neoplasm of skin: Secondary | ICD-10-CM | POA: Diagnosis not present

## 2022-08-06 DIAGNOSIS — L821 Other seborrheic keratosis: Secondary | ICD-10-CM | POA: Diagnosis not present

## 2022-08-12 DIAGNOSIS — M4316 Spondylolisthesis, lumbar region: Secondary | ICD-10-CM | POA: Diagnosis not present

## 2022-08-12 DIAGNOSIS — M48062 Spinal stenosis, lumbar region with neurogenic claudication: Secondary | ICD-10-CM | POA: Diagnosis not present

## 2022-08-12 DIAGNOSIS — Z6830 Body mass index (BMI) 30.0-30.9, adult: Secondary | ICD-10-CM | POA: Diagnosis not present

## 2022-08-18 ENCOUNTER — Other Ambulatory Visit: Payer: Self-pay | Admitting: Neurological Surgery

## 2022-08-20 ENCOUNTER — Other Ambulatory Visit: Payer: Self-pay | Admitting: Family Medicine

## 2022-08-20 DIAGNOSIS — M159 Polyosteoarthritis, unspecified: Secondary | ICD-10-CM

## 2022-08-20 NOTE — Telephone Encounter (Signed)
ERx 

## 2022-08-20 NOTE — Telephone Encounter (Signed)
Name of Medication: Tramadol Name of Pharmacy: [facility] Last Fill or Written Date and Quantity: 04/20/22, #30 Last Office Visit and Type: 06/21/22, 2 mo HTN f/u Next Office Visit and Type: 01/21/23, CPE Last Controlled Substance Agreement Date: none Last UDS: one

## 2022-08-27 NOTE — Pre-Procedure Instructions (Signed)
Surgical Instructions    Your procedure is scheduled on Friday, May 10th.  Report to [facility] Main Entrance "A" at 08:00 A.M., then check in with the Admitting office.  Call this number if you have problems the morning of surgery:  [phone]  If you have any questions prior to your surgery date call [phone]: Open Monday-Friday 8am-4pm If you experience any cold or flu symptoms such as cough, fever, chills, shortness of breath, etc. between now and your scheduled surgery, please notify us at the above number.     Remember:  Do not eat or drink after midnight the night before your surgery     Take these medicines the morning of surgery with A SIP OF WATER  amLODipine (NORVASC)  atorvastatin (LIPITOR)  metoprolol succinate (TOPROL-XL)    If needed: acetaminophen (TYLENOL)  traMADol (ULTRAM)    Follow your surgeon's instructions on when to stop Aspirin.  If no instructions were given by your surgeon then you will need to call the office to get those instructions.     As of today, STOP taking any Aleve, Naproxen, Ibuprofen, Motrin, Advil, Goody's, BC's, all herbal medications, fish oil, and all vitamins.                     Do NOT Smoke (Tobacco/Vaping) for 24 hours prior to your procedure.  If you use a CPAP at night, you may bring your mask/headgear for your overnight stay.   Contacts, glasses, piercing's, hearing aid's, dentures or partials may not be worn into surgery, please bring cases for these belongings.    For patients admitted to the hospital, discharge time will be determined by your treatment team.   Patients discharged the day of surgery will not be allowed to drive home, and someone needs to stay with them for 24 hours.  SURGICAL WAITING ROOM VISITATION Patients having surgery or a procedure may have no more than 2 support people in the waiting area - these visitors may rotate.   Children under the age of 75 must have an adult with them who is not the  patient. If the patient needs to stay at the hospital during part of their recovery, the visitor guidelines for inpatient rooms apply. Pre-op nurse will coordinate an appropriate time for 1 support person to accompany patient in pre-op.  This support person may not rotate.   Please refer to the [facility] website for the visitor guidelines for Inpatients (after your surgery is over and you are in a regular room).     Day of Surgery: Take a shower with CHG soap. Do not wear jewelry or makeup Do not wear lotions, powders, perfumes, or deodorant. Do not bring valuables to the hospital. [facility] is not responsible for any belongings or valuables. Do not wear nail polish, gel polish, artificial nails, or any other type of covering on natural nails (fingers and toes) If you have artificial nails or gel coating that need to be removed by a nail salon, please have this removed prior to surgery. Artificial nails or gel coating may interfere with anesthesia's ability to adequately monitor your vital signs. Wear Clean/Comfortable clothing the morning of surgery Remember to brush your teeth WITH YOUR REGULAR TOOTHPASTE.   Please read over the following fact sheets that you were given.    If you received a COVID test during your pre-op visit  it is requested that you wear a mask when out in public, stay away from anyone that may  not be feeling well and notify your surgeon if you develop symptoms. If you have been in contact with anyone that has tested positive in the last 10 days please notify you surgeon.

## 2022-08-30 ENCOUNTER — Encounter (HOSPITAL_COMMUNITY)
Admission: RE | Admit: 2022-08-30 | Discharge: 2022-08-30 | Disposition: A | Discharge status: Home / Self Care | Payer: Medicare Other | Source: Ambulatory Visit | Attending: Neurological Surgery | Admitting: Neurological Surgery

## 2022-08-30 ENCOUNTER — Encounter (HOSPITAL_COMMUNITY): Payer: Self-pay

## 2022-08-30 ENCOUNTER — Other Ambulatory Visit: Payer: Self-pay

## 2022-08-30 VITALS — BP 130/53 | HR 66 | Temp 98.4°F | Resp 18 | Ht 61.0 in | Wt 159.2 lb

## 2022-08-30 DIAGNOSIS — I10 Essential (primary) hypertension: Secondary | ICD-10-CM | POA: Insufficient documentation

## 2022-08-30 DIAGNOSIS — K760 Fatty (change of) liver, not elsewhere classified: Secondary | ICD-10-CM | POA: Insufficient documentation

## 2022-08-30 DIAGNOSIS — Z01818 Encounter for other preprocedural examination: Secondary | ICD-10-CM | POA: Diagnosis not present

## 2022-08-30 HISTORY — DX: Palpitations: R00.2

## 2022-08-30 LAB — CBC
HCT: 37.7 % (ref 36.0–46.0)
Hemoglobin: 12.5 g/dL (ref 12.0–15.0)
MCH: 31.6 pg (ref 26.0–34.0)
MCHC: 33.2 g/dL (ref 30.0–36.0)
MCV: 95.4 fL (ref 80.0–100.0)
Platelets: 316 10*3/uL (ref 150–400)
RBC: 3.95 MIL/uL (ref 3.87–5.11)
RDW: 12.2 % (ref 11.5–15.5)
WBC: 8.1 10*3/uL (ref 4.0–10.5)
nRBC: 0 % (ref 0.0–0.2)

## 2022-08-30 LAB — COMPREHENSIVE METABOLIC PANEL
ALT: 14 U/L (ref 0–44)
AST: 18 U/L (ref 15–41)
Albumin: 4.1 g/dL (ref 3.5–5.0)
Alkaline Phosphatase: 84 U/L (ref 38–126)
Anion gap: 11 (ref 5–15)
BUN: 19 mg/dL (ref 8–23)
CO2: 21 mmol/L — ABNORMAL LOW (ref 22–32)
Calcium: 9.5 mg/dL (ref 8.9–10.3)
Chloride: 105 mmol/L (ref 98–111)
Creatinine, Ser: 1.3 mg/dL — ABNORMAL HIGH (ref 0.44–1.00)
GFR, Estimated: 41 mL/min — ABNORMAL LOW (ref >60)
Glucose, Bld: 102 mg/dL — ABNORMAL HIGH (ref 70–99)
Potassium: 4.3 mmol/L (ref 3.5–5.1)
Sodium: 137 mmol/L (ref 135–145)
Total Bilirubin: 0.4 mg/dL (ref 0.3–1.2)
Total Protein: 7.1 g/dL (ref 6.5–8.1)

## 2022-08-30 LAB — SURGICAL PCR SCREEN
MRSA, PCR: NEGATIVE
Staphylococcus aureus: POSITIVE — AB

## 2022-08-30 LAB — TYPE AND SCREEN
ABO/RH(D): O POS
Antibody Screen: NEGATIVE

## 2022-08-30 LAB — PROTIME-INR
INR: 1.1 (ref 0.8–1.2)
Prothrombin Time: 13.9 seconds (ref 11.4–15.2)

## 2022-08-30 NOTE — Progress Notes (Signed)
PCP - Dr. Eustaquio Boyden Cardiologist - denies  PPM/ICD - denies   Chest x-ray - 09/17/2005 EKG - 08/30/22 Stress Test - denies ECHO - denies Cardiac Cath - denies  Sleep Study - denies   DM- denies  Blood Thinner Instructions: n/a Aspirin Instructions: Hold 5-7 days. Last dose 4/29  ERAS Protcol - no, NPO   COVID TEST- n/a   Anesthesia review: no  Patient denies shortness of breath, fever, cough and chest pain at PAT appointment   All instructions explained to the patient, with a verbal understanding of the material. Patient agrees to go over the instructions while at home for a better understanding. The opportunity to ask questions was provided.

## 2022-09-02 ENCOUNTER — Telehealth: Payer: Self-pay | Admitting: *Deleted

## 2022-09-02 NOTE — Telephone Encounter (Signed)
In response to you or your physician's or other prescriber's recent request, we have  approved coverage of TRAMADOL HCL 50 MG TABLET.  This authorization is good until 04/26/2023.

## 2022-09-02 NOTE — Telephone Encounter (Signed)
Prior auth started via cover my meds.  Awaiting determination.  Key: [id number]

## 2022-09-03 ENCOUNTER — Encounter (HOSPITAL_COMMUNITY)
Admission: RE | Disposition: A | Discharge status: Home / Self Care | Payer: Self-pay | Source: Home / Self Care | Attending: Neurological Surgery

## 2022-09-03 ENCOUNTER — Ambulatory Visit (HOSPITAL_COMMUNITY): Payer: Medicare Other | Admitting: Certified Registered Nurse Anesthetist

## 2022-09-03 ENCOUNTER — Other Ambulatory Visit: Payer: Self-pay

## 2022-09-03 ENCOUNTER — Encounter (HOSPITAL_COMMUNITY): Payer: Self-pay | Admitting: Neurological Surgery

## 2022-09-03 ENCOUNTER — Ambulatory Visit (HOSPITAL_BASED_OUTPATIENT_CLINIC_OR_DEPARTMENT_OTHER): Payer: Medicare Other | Admitting: Certified Registered Nurse Anesthetist

## 2022-09-03 ENCOUNTER — Ambulatory Visit (HOSPITAL_COMMUNITY): Payer: Medicare Other

## 2022-09-03 ENCOUNTER — Observation Stay (HOSPITAL_COMMUNITY)
Admission: RE | Admit: 2022-09-03 | Discharge: 2022-09-04 | Disposition: A | Discharge status: Home / Self Care | Payer: Medicare Other | Attending: Neurological Surgery | Admitting: Neurological Surgery

## 2022-09-03 DIAGNOSIS — M4316 Spondylolisthesis, lumbar region: Principal | ICD-10-CM | POA: Insufficient documentation

## 2022-09-03 DIAGNOSIS — I10 Essential (primary) hypertension: Secondary | ICD-10-CM | POA: Diagnosis not present

## 2022-09-03 DIAGNOSIS — Z85828 Personal history of other malignant neoplasm of skin: Secondary | ICD-10-CM | POA: Diagnosis not present

## 2022-09-03 DIAGNOSIS — Z4789 Encounter for other orthopedic aftercare: Secondary | ICD-10-CM | POA: Diagnosis not present

## 2022-09-03 DIAGNOSIS — I129 Hypertensive chronic kidney disease with stage 1 through stage 4 chronic kidney disease, or unspecified chronic kidney disease: Secondary | ICD-10-CM | POA: Diagnosis not present

## 2022-09-03 DIAGNOSIS — M48062 Spinal stenosis, lumbar region with neurogenic claudication: Secondary | ICD-10-CM

## 2022-09-03 DIAGNOSIS — N183 Chronic kidney disease, stage 3 unspecified: Secondary | ICD-10-CM | POA: Diagnosis not present

## 2022-09-03 DIAGNOSIS — Z7982 Long term (current) use of aspirin: Secondary | ICD-10-CM | POA: Diagnosis not present

## 2022-09-03 DIAGNOSIS — M4317 Spondylolisthesis, lumbosacral region: Secondary | ICD-10-CM | POA: Diagnosis not present

## 2022-09-03 DIAGNOSIS — Z79899 Other long term (current) drug therapy: Secondary | ICD-10-CM | POA: Diagnosis not present

## 2022-09-03 DIAGNOSIS — Z981 Arthrodesis status: Secondary | ICD-10-CM | POA: Diagnosis not present

## 2022-09-03 HISTORY — PX: LAMINECTOMY WITH POSTERIOR LATERAL ARTHRODESIS LEVEL 2: SHX6336

## 2022-09-03 SURGERY — LAMINECTOMY WITH POSTERIOR LATERAL ARTHRODESIS LEVEL 2
Anesthesia: General | Site: Back

## 2022-09-03 MED ORDER — PHENYLEPHRINE 80 MCG/ML (10ML) SYRINGE FOR IV PUSH (FOR BLOOD PRESSURE SUPPORT)
PREFILLED_SYRINGE | INTRAVENOUS | Status: DC | PRN
  Administered 2022-09-03 (×2): 160 ug via INTRAVENOUS

## 2022-09-03 MED ORDER — FENTANYL CITRATE (PF) 250 MCG/5ML IJ SOLN
INTRAMUSCULAR | Status: AC
  Filled 2022-09-03: qty 5

## 2022-09-03 MED ORDER — POTASSIUM CHLORIDE IN NACL 20-0.9 MEQ/L-% IV SOLN: INTRAVENOUS | Status: DC

## 2022-09-03 MED ORDER — LACTATED RINGERS IV SOLN: INTRAVENOUS | Status: DC | PRN

## 2022-09-03 MED ORDER — SODIUM CHLORIDE 0.9% FLUSH: 3.0000 mL | INTRAVENOUS | Status: DC | PRN

## 2022-09-03 MED ORDER — VANCOMYCIN HCL 1000 MG IV SOLR
INTRAVENOUS | Status: AC
  Filled 2022-09-03: qty 20

## 2022-09-03 MED ORDER — FENTANYL CITRATE (PF) 100 MCG/2ML IJ SOLN
25.0000 ug | INTRAMUSCULAR | Status: DC | PRN
  Administered 2022-09-03 (×2): 50 ug via INTRAVENOUS

## 2022-09-03 MED ORDER — THROMBIN 20000 UNITS EX SOLR
CUTANEOUS | Status: DC | PRN
  Administered 2022-09-03: 20 mL via TOPICAL

## 2022-09-03 MED ORDER — METHOCARBAMOL 500 MG PO TABS
500.0000 mg | ORAL_TABLET | Freq: Four times a day (QID) | ORAL | Status: DC | PRN
  Administered 2022-09-03 – 2022-09-04 (×3): 500 mg via ORAL
  Filled 2022-09-03 (×3): qty 1

## 2022-09-03 MED ORDER — FENTANYL CITRATE (PF) 250 MCG/5ML IJ SOLN
INTRAMUSCULAR | Status: DC | PRN
  Administered 2022-09-03: 50 ug via INTRAVENOUS
  Administered 2022-09-03: 25 ug via INTRAVENOUS
  Administered 2022-09-03: 50 ug via INTRAVENOUS

## 2022-09-03 MED ORDER — ONDANSETRON HCL 4 MG/2ML IJ SOLN
4.0000 mg | Freq: Four times a day (QID) | INTRAMUSCULAR | Status: DC | PRN
  Administered 2022-09-03: 4 mg via INTRAVENOUS
  Filled 2022-09-03: qty 2

## 2022-09-03 MED ORDER — LIDOCAINE 2% (20 MG/ML) 5 ML SYRINGE
INTRAMUSCULAR | Status: DC | PRN
  Administered 2022-09-03: 60 mg via INTRAVENOUS

## 2022-09-03 MED ORDER — SENNA 8.6 MG PO TABS
1.0000 | ORAL_TABLET | Freq: Two times a day (BID) | ORAL | Status: DC
  Administered 2022-09-03 – 2022-09-04 (×2): 8.6 mg via ORAL
  Filled 2022-09-03 (×2): qty 1

## 2022-09-03 MED ORDER — ONDANSETRON HCL 4 MG PO TABS: 4.0000 mg | ORAL_TABLET | Freq: Four times a day (QID) | ORAL | Status: DC | PRN

## 2022-09-03 MED ORDER — ACETAMINOPHEN 500 MG PO TABS
1000.0000 mg | ORAL_TABLET | ORAL | Status: AC
  Administered 2022-09-03: 1000 mg via ORAL
  Filled 2022-09-03: qty 2

## 2022-09-03 MED ORDER — CHLORHEXIDINE GLUCONATE CLOTH 2 % EX PADS: 6.0000 | MEDICATED_PAD | Freq: Once | CUTANEOUS | Status: DC

## 2022-09-03 MED ORDER — DEXAMETHASONE SODIUM PHOSPHATE 4 MG/ML IJ SOLN
4.0000 mg | Freq: Four times a day (QID) | INTRAMUSCULAR | Status: DC
  Administered 2022-09-03: 4 mg via INTRAVENOUS
  Filled 2022-09-03: qty 1

## 2022-09-03 MED ORDER — DEXAMETHASONE 4 MG PO TABS
4.0000 mg | ORAL_TABLET | Freq: Four times a day (QID) | ORAL | Status: DC
  Administered 2022-09-03 – 2022-09-04 (×2): 4 mg via ORAL
  Filled 2022-09-03 (×2): qty 1

## 2022-09-03 MED ORDER — MORPHINE SULFATE (PF) 2 MG/ML IV SOLN
2.0000 mg | INTRAVENOUS | Status: DC | PRN
  Administered 2022-09-03: 2 mg via INTRAVENOUS
  Filled 2022-09-03: qty 1

## 2022-09-03 MED ORDER — SUCCINYLCHOLINE CHLORIDE 200 MG/10ML IV SOSY
PREFILLED_SYRINGE | INTRAVENOUS | Status: DC | PRN
  Administered 2022-09-03: 100 mg via INTRAVENOUS

## 2022-09-03 MED ORDER — SURGIRINSE WOUND IRRIGATION SYSTEM - OPTIME
TOPICAL | Status: DC | PRN
  Administered 2022-09-03: 450 mL via TOPICAL

## 2022-09-03 MED ORDER — SODIUM CHLORIDE 0.9 % IV SOLN
250.0000 mL | INTRAVENOUS | Status: DC
  Administered 2022-09-03: 250 mL via INTRAVENOUS

## 2022-09-03 MED ORDER — HYDROCODONE-ACETAMINOPHEN 5-325 MG PO TABS
1.0000 | ORAL_TABLET | ORAL | Status: DC | PRN
  Administered 2022-09-03: 1 via ORAL
  Administered 2022-09-03: 2 via ORAL
  Administered 2022-09-04: 1 via ORAL
  Filled 2022-09-03: qty 1
  Filled 2022-09-03 (×2): qty 2

## 2022-09-03 MED ORDER — 0.9 % SODIUM CHLORIDE (POUR BTL) OPTIME
TOPICAL | Status: DC | PRN
  Administered 2022-09-03 (×2): 1000 mL

## 2022-09-03 MED ORDER — PHENOL 1.4 % MT LIQD: 1.0000 | OROMUCOSAL | Status: DC | PRN

## 2022-09-03 MED ORDER — DEXAMETHASONE SODIUM PHOSPHATE 10 MG/ML IJ SOLN
INTRAMUSCULAR | Status: DC | PRN
  Administered 2022-09-03: 10 mg via INTRAVENOUS

## 2022-09-03 MED ORDER — METOPROLOL SUCCINATE ER 25 MG PO TB24: 25.0000 mg | ORAL_TABLET | Freq: Every day | ORAL | Status: DC

## 2022-09-03 MED ORDER — THROMBIN 5000 UNITS EX SOLR
CUTANEOUS | Status: AC
  Filled 2022-09-03: qty 5000

## 2022-09-03 MED ORDER — PROPOFOL 10 MG/ML IV BOLUS
INTRAVENOUS | Status: DC | PRN
  Administered 2022-09-03: 120 mg via INTRAVENOUS

## 2022-09-03 MED ORDER — AMLODIPINE BESYLATE 5 MG PO TABS: 5.0000 mg | ORAL_TABLET | Freq: Every day | ORAL | Status: DC

## 2022-09-03 MED ORDER — ACETAMINOPHEN 500 MG PO TABS
1000.0000 mg | ORAL_TABLET | Freq: Once | ORAL | Status: DC
Start: 2022-09-03 — End: 2022-09-03

## 2022-09-03 MED ORDER — LACTATED RINGERS IV SOLN: INTRAVENOUS | Status: DC

## 2022-09-03 MED ORDER — PHENYLEPHRINE HCL-NACL 20-0.9 MG/250ML-% IV SOLN
INTRAVENOUS | Status: DC | PRN
  Administered 2022-09-03: 50 ug/min via INTRAVENOUS

## 2022-09-03 MED ORDER — ACETAMINOPHEN 500 MG PO TABS: 1000.0000 mg | ORAL_TABLET | Freq: Four times a day (QID) | ORAL | Status: DC

## 2022-09-03 MED ORDER — ASPIRIN 81 MG PO TBEC
81.0000 mg | DELAYED_RELEASE_TABLET | ORAL | Status: DC
  Administered 2022-09-03: 81 mg via ORAL
  Filled 2022-09-03: qty 1

## 2022-09-03 MED ORDER — ORAL CARE MOUTH RINSE: 15.0000 mL | Freq: Once | OROMUCOSAL | Status: AC

## 2022-09-03 MED ORDER — THROMBIN 20000 UNITS EX SOLR
CUTANEOUS | Status: AC
  Filled 2022-09-03: qty 20000

## 2022-09-03 MED ORDER — CEFAZOLIN SODIUM-DEXTROSE 2-4 GM/100ML-% IV SOLN
2.0000 g | INTRAVENOUS | Status: AC
  Administered 2022-09-03: 2 g via INTRAVENOUS
  Filled 2022-09-03: qty 100

## 2022-09-03 MED ORDER — THROMBIN 5000 UNITS EX SOLR
OROMUCOSAL | Status: DC | PRN
  Administered 2022-09-03: 5 mL via TOPICAL

## 2022-09-03 MED ORDER — ACETAMINOPHEN 650 MG RE SUPP: 650.0000 mg | RECTAL | Status: DC | PRN

## 2022-09-03 MED ORDER — SODIUM CHLORIDE 0.9% FLUSH: 3.0000 mL | Freq: Two times a day (BID) | INTRAVENOUS | Status: DC

## 2022-09-03 MED ORDER — MENTHOL 3 MG MT LOZG: 1.0000 | LOZENGE | OROMUCOSAL | Status: DC | PRN

## 2022-09-03 MED ORDER — IRBESARTAN 75 MG PO TABS
37.5000 mg | ORAL_TABLET | Freq: Every day | ORAL | Status: DC
  Administered 2022-09-03: 37.5 mg via ORAL
  Filled 2022-09-03: qty 1

## 2022-09-03 MED ORDER — ONDANSETRON HCL 4 MG/2ML IJ SOLN
INTRAMUSCULAR | Status: DC | PRN
  Administered 2022-09-03: 4 mg via INTRAVENOUS

## 2022-09-03 MED ORDER — SUGAMMADEX SODIUM 200 MG/2ML IV SOLN
INTRAVENOUS | Status: DC | PRN
  Administered 2022-09-03: 50 mg via INTRAVENOUS
  Administered 2022-09-03: 100 mg via INTRAVENOUS

## 2022-09-03 MED ORDER — ACETAMINOPHEN 325 MG PO TABS: 650.0000 mg | ORAL_TABLET | ORAL | Status: DC | PRN

## 2022-09-03 MED ORDER — FENTANYL CITRATE (PF) 100 MCG/2ML IJ SOLN
INTRAMUSCULAR | Status: AC
  Filled 2022-09-03: qty 2

## 2022-09-03 MED ORDER — ROCURONIUM BROMIDE 10 MG/ML (PF) SYRINGE
PREFILLED_SYRINGE | INTRAVENOUS | Status: DC | PRN
  Administered 2022-09-03: 50 mg via INTRAVENOUS
  Administered 2022-09-03: 10 mg via INTRAVENOUS

## 2022-09-03 MED ORDER — HYDROCODONE-ACETAMINOPHEN 5-325 MG PO TABS
1.0000 | ORAL_TABLET | ORAL | Status: DC | PRN
  Administered 2022-09-03 (×2): 1 via ORAL
  Filled 2022-09-03: qty 1

## 2022-09-03 MED ORDER — METHOCARBAMOL 1000 MG/10ML IJ SOLN
500.0000 mg | Freq: Four times a day (QID) | INTRAVENOUS | Status: DC | PRN
  Filled 2022-09-03: qty 5

## 2022-09-03 MED ORDER — CHLORHEXIDINE GLUCONATE 0.12 % MT SOLN
15.0000 mL | Freq: Once | OROMUCOSAL | Status: AC
  Administered 2022-09-03: 15 mL via OROMUCOSAL
  Filled 2022-09-03: qty 15

## 2022-09-03 MED ORDER — BUPIVACAINE HCL (PF) 0.25 % IJ SOLN
INTRAMUSCULAR | Status: DC | PRN
  Administered 2022-09-03: 7 mL

## 2022-09-03 MED ORDER — GABAPENTIN 300 MG PO CAPS
300.0000 mg | ORAL_CAPSULE | ORAL | Status: AC
  Administered 2022-09-03: 300 mg via ORAL
  Filled 2022-09-03: qty 1

## 2022-09-03 MED ORDER — BUPIVACAINE HCL (PF) 0.25 % IJ SOLN
INTRAMUSCULAR | Status: AC
  Filled 2022-09-03: qty 30

## 2022-09-03 MED ORDER — CEFAZOLIN SODIUM-DEXTROSE 2-4 GM/100ML-% IV SOLN
2.0000 g | Freq: Three times a day (TID) | INTRAVENOUS | Status: AC
  Administered 2022-09-03 (×2): 2 g via INTRAVENOUS
  Filled 2022-09-03 (×2): qty 100

## 2022-09-03 SURGICAL SUPPLY — 62 items
ADH SKN CLS APL DERMABOND .7 (GAUZE/BANDAGES/DRESSINGS) ×1
APL SKNCLS STERI-STRIP NONHPOA (GAUZE/BANDAGES/DRESSINGS) ×1
BAG COUNTER SPONGE SURGICOUNT (BAG) ×1 IMPLANT
BAG SPNG CNTER NS LX DISP (BAG) ×1
BASKET BONE COLLECTION (BASKET) IMPLANT
BENZOIN TINCTURE PRP APPL 2/3 (GAUZE/BANDAGES/DRESSINGS) ×1 IMPLANT
BLADE BONE MILL MEDIUM (MISCELLANEOUS) IMPLANT
BLADE CLIPPER SURG (BLADE) IMPLANT
BUR CARBIDE MATCH 3.0 (BURR) ×1 IMPLANT
CANISTER SUCT 3000ML PPV (MISCELLANEOUS) ×1 IMPLANT
CNTNR URN SCR LID CUP LEK RST (MISCELLANEOUS) ×1 IMPLANT
CONT SPEC 4OZ STRL OR WHT (MISCELLANEOUS) ×1
COVER BACK TABLE 60X90IN (DRAPES) ×1 IMPLANT
DERMABOND ADVANCED .7 DNX12 (GAUZE/BANDAGES/DRESSINGS) IMPLANT
DRAPE C-ARM 42X72 X-RAY (DRAPES) IMPLANT
DRAPE LAPAROTOMY 100X72X124 (DRAPES) ×1 IMPLANT
DRAPE SURG 17X23 STRL (DRAPES) ×1 IMPLANT
DRSG OPSITE POSTOP 4X6 (GAUZE/BANDAGES/DRESSINGS) IMPLANT
DURAPREP 26ML APPLICATOR (WOUND CARE) ×1 IMPLANT
ELECT REM PT RETURN 9FT ADLT (ELECTROSURGICAL) ×1
ELECTRODE REM PT RTRN 9FT ADLT (ELECTROSURGICAL) ×1 IMPLANT
EVACUATOR 1/8 PVC DRAIN (DRAIN) IMPLANT
FIBER BONE ALLOSYNC EXPAND 5 (Bone Implant) IMPLANT
GAUZE 4X4 16PLY ~~LOC~~+RFID DBL (SPONGE) IMPLANT
GLOVE BIO SURGEON STRL SZ7 (GLOVE) IMPLANT
GLOVE BIO SURGEON STRL SZ8 (GLOVE) ×2 IMPLANT
GLOVE BIOGEL PI IND STRL 7.0 (GLOVE) IMPLANT
GOWN STRL REUS W/ TWL LRG LVL3 (GOWN DISPOSABLE) IMPLANT
GOWN STRL REUS W/ TWL XL LVL3 (GOWN DISPOSABLE) ×2 IMPLANT
GOWN STRL REUS W/TWL 2XL LVL3 (GOWN DISPOSABLE) IMPLANT
GOWN STRL REUS W/TWL LRG LVL3 (GOWN DISPOSABLE)
GOWN STRL REUS W/TWL XL LVL3 (GOWN DISPOSABLE) ×2
HEMOSTAT POWDER KIT SURGIFOAM (HEMOSTASIS) IMPLANT
KIT BASIN OR (CUSTOM PROCEDURE TRAY) ×1 IMPLANT
KIT TURNOVER KIT B (KITS) ×1 IMPLANT
MILL BONE PREP (MISCELLANEOUS) IMPLANT
NDL HYPO 25X1 1.5 SAFETY (NEEDLE) ×1 IMPLANT
NEEDLE HYPO 25X1 1.5 SAFETY (NEEDLE) ×1 IMPLANT
NS IRRIG 1000ML POUR BTL (IV SOLUTION) ×1 IMPLANT
PACK LAMINECTOMY NEURO (CUSTOM PROCEDURE TRAY) ×1 IMPLANT
PAD ARMBOARD 7.5X6 YLW CONV (MISCELLANEOUS) ×3 IMPLANT
PUTTY DBM 10CC (Putty) IMPLANT
ROD LORD LIPPED TI 5.5X60 (Rod) IMPLANT
SCREW CANC SHANK MOD 6.5X45 (Screw) IMPLANT
SCREW POLYAXIAL TULIP (Screw) IMPLANT
SCREW SHANK MOD 5.5X40 (Screw) IMPLANT
SET SCREW (Screw) ×6 IMPLANT
SET SCREW SPNE (Screw) IMPLANT
SOL ELECTROSURG ANTI STICK (MISCELLANEOUS) ×1
SOLUTION ELECTROSURG ANTI STCK (MISCELLANEOUS) ×1 IMPLANT
SOLUTION IRRIG SURGIPHOR (IV SOLUTION) IMPLANT
SPONGE SURGIFOAM ABS GEL 100 (HEMOSTASIS) ×1 IMPLANT
SPONGE T-LAP 4X18 ~~LOC~~+RFID (SPONGE) IMPLANT
STRIP CLOSURE SKIN 1/2X4 (GAUZE/BANDAGES/DRESSINGS) ×2 IMPLANT
SUT VIC AB 0 CT1 18XCR BRD8 (SUTURE) ×1 IMPLANT
SUT VIC AB 0 CT1 8-18 (SUTURE) ×1
SUT VIC AB 2-0 CP2 18 (SUTURE) ×1 IMPLANT
SUT VIC AB 3-0 SH 8-18 (SUTURE) ×2 IMPLANT
TOWEL GREEN STERILE (TOWEL DISPOSABLE) ×1 IMPLANT
TOWEL GREEN STERILE FF (TOWEL DISPOSABLE) ×1 IMPLANT
TRAY FOLEY MTR SLVR 16FR STAT (SET/KITS/TRAYS/PACK) IMPLANT
WATER STERILE IRR 1000ML POUR (IV SOLUTION) ×1 IMPLANT

## 2022-09-03 NOTE — Transfer of Care (Signed)
Immediate Anesthesia Transfer of Care Note  Patient: Carrie Lucas  Procedure(s) Performed: Laminectomy and Foraminotomy - Lumbar Three-Lumbar Four, posterolateral fusion and segmental instrumentation Lumbar Three-Five (Back)  Patient Location: PACU  Anesthesia Type:General  Level of Consciousness: awake, alert , drowsy, and patient cooperative  Airway & Oxygen Therapy: Patient Spontanous Breathing and Patient connected to face mask oxygen  Post-op Assessment: Report given to RN and Post -op Vital signs reviewed and stable  Post vital signs: Reviewed and stable  Last Vitals:  Vitals Value Taken Time  BP    Temp    Pulse    Resp    SpO2      Last Pain:  Vitals:   09/03/22 0838  TempSrc:   PainSc: 0-No pain         Complications: There were no known notable events for this encounter.

## 2022-09-03 NOTE — Anesthesia Postprocedure Evaluation (Signed)
Anesthesia Post Note  Patient: Carrie Lucas  Procedure(s) Performed: Laminectomy and Foraminotomy - Lumbar Three-Lumbar Four, posterolateral fusion and segmental instrumentation Lumbar Three-Five (Back)     Patient location during evaluation: PACU Anesthesia Type: General Level of consciousness: awake and alert Pain management: pain level controlled Vital Signs Assessment: post-procedure vital signs reviewed and stable Respiratory status: spontaneous breathing, nonlabored ventilation and respiratory function stable Cardiovascular status: blood pressure returned to baseline and stable Postop Assessment: no apparent nausea or vomiting Anesthetic complications: no  There were no known notable events for this encounter.  Last Vitals:  Vitals:   09/03/22 1445 09/03/22 1530  BP: (!) 123/59 (!) 121/52  Pulse: 66 64  Resp: 12 18  Temp:  (!) 36.4 C  SpO2: 95% 94%    Last Pain:  Vitals:   09/03/22 1530  TempSrc: Oral  PainSc:                  Alice Burnside,W. EDMOND

## 2022-09-03 NOTE — Anesthesia Preprocedure Evaluation (Addendum)
Anesthesia Evaluation  Patient identified by MRN, date of birth, ID band Patient awake    Reviewed: Allergy & Precautions, H&P , NPO status , Patient's Chart, lab work & pertinent test results  Airway Mallampati: III  TM Distance: >3 FB Neck ROM: Full    Dental no notable dental hx. (+) Teeth Intact, Dental Advisory Given   Pulmonary neg pulmonary ROS   Pulmonary exam normal breath sounds clear to auscultation       Cardiovascular hypertension, Pt. on medications  Rhythm:Regular Rate:Normal     Neuro/Psych  Headaches  negative psych ROS   GI/Hepatic negative GI ROS, Neg liver ROS,,,  Endo/Other  negative endocrine ROS    Renal/GU Renal InsufficiencyRenal disease  negative genitourinary   Musculoskeletal  (+) Arthritis , Osteoarthritis,    Abdominal   Peds  Hematology negative hematology ROS (+)   Anesthesia Other Findings   Reproductive/Obstetrics negative OB ROS                             Anesthesia Physical Anesthesia Plan  ASA: 2  Anesthesia Plan: General   Post-op Pain Management: Tylenol PO (pre-op)*   Induction: Intravenous  PONV Risk Score and Plan: 4 or greater and Ondansetron, Dexamethasone and Treatment may vary due to age or medical condition  Airway Management Planned: Oral ETT  Additional Equipment:   Intra-op Plan:   Post-operative Plan: Extubation in OR  Informed Consent: I have reviewed the patients History and Physical, chart, labs and discussed the procedure including the risks, benefits and alternatives for the proposed anesthesia with the patient or authorized representative who has indicated his/her understanding and acceptance.     Dental advisory given  Plan Discussed with: CRNA  Anesthesia Plan Comments:        Anesthesia Quick Evaluation

## 2022-09-03 NOTE — H&P (Signed)
Subjective: Patient is a 82 y.o. female admitted for spondylolisthesis with stenosis L3-4 L4-5. Onset of symptoms was several years ago, gradually worsening since that time.  The pain is rated severe, and is located at the across the lower back and radiates to legs. The pain is described as aching and occurs all day. The symptoms have been progressive. Symptoms are exacerbated by exercise, standing, and walking for more than a few minutes. MRI or CT showed dynamic spondylolisthesis L3-4 and L4-5 with severe spinal stenosis at L3-4  Past Medical History:  Diagnosis Date   Arthritis    R middle finger, chronic arthralgias   BCC (basal cell carcinoma of skin) 06/2015, 06/2017   supraclavicular, nasal bridge, R lower back - [name]   Carotid stenosis, asymptomatic 12/16/2014   Mild bilateral 1-39%, consider rpt [place] 1 yr (11/2014)    Chronic renal disease, stage III (HCC)    Colon polyp    DDD (degenerative disc disease), lumbar 2014   severe facet degeneration L4/5, mod spinal setnosis, mild foraminal stenosis   Dysplastic nevus 08/04/2021   To mid upper back, with severe atypia, rec re-excision ([name]) 06/2021   Erosive osteoarthritis    both hands   Headache    Hepatic steatosis 2012   mild by CT   History of melanoma 2003   face (Dr Dawna Part)   HLD (hyperlipidemia)    HTN (hypertension)    IBS (irritable bowel syndrome)    Insomnia    LPRD (laryngopharyngeal reflux disease)    [facility]ane)   Osteopenia 2011   DEXA T -1.6 hip, -1.3 forearm (12/2015)   Palpitations    Sialadenitis 05/2013   acute Jenne Pane)   SK (seborrheic keratosis) 06/2020   L lateral upper back ([name])    Past Surgical History:  Procedure Laterality Date   ABI  06/2010   WNL   BACK SURGERY  1995   lumbar   BILATERAL OOPHORECTOMY  2007   ovarian cysts   BLADDER SUSPENSION     midurethral sling for SUI   CARPAL TUNNEL RELEASE Bilateral 2000   COLONOSCOPY  8 30 12    TAx1, diverticulosis, rpt 5 yrs (Out[facility])    COLONOSCOPY  03/2016   WNL ([facility])   DEXA  2011   mild osteopenia (T -1.2 R femur)   ESI  2014   L L4 transforaminal   ESOPHAGOGASTRODUODENOSCOPY  12/24/2010   reflux esophagitis   MOHS SURGERY  2016   BCC   RADIAL KERATOTOMY Left    TONSILLECTOMY  Childhood   VAGINAL HYSTERECTOMY     bladder drop    Prior to Admission medications   Medication Sig Start Date End Date Taking? Authorizing Provider  acetaminophen (TYLENOL) 650 MG CR tablet Take 1 tablet (650 mg total) by mouth 3 (three) times daily as needed for pain. 01/07/20  Yes Eustaquio Boyden, MD  amLODipine (NORVASC) 5 MG tablet Take 1 tablet (5 mg total) by mouth daily. 06/21/22  Yes Eustaquio Boyden, MD  aspirin EC 81 MG tablet Take 1 tablet (81 mg total) by mouth every Monday, Wednesday, and Friday. 12/19/15  Yes Eustaquio Boyden, MD  atorvastatin (LIPITOR) 20 MG tablet Take 1 tablet by mouth once daily 03/29/22  Yes Eustaquio Boyden, MD  Cholecalciferol (VITAMIN D3) 1000 units CAPS Take 1,000 Units by mouth daily.   Yes [provider]  cyanocobalamin 1000 MCG tablet Take 1 tablet (1,000 mcg total) by mouth every Monday, Wednesday, and Friday. 06/23/22  Yes Eustaquio Boyden, MD  metoprolol  succinate (TOPROL-XL) 25 MG 24 hr tablet Take 1/2 (one-half) tablet by mouth once daily 04/09/22  Yes Eustaquio Boyden, MD  traMADol (ULTRAM) 50 MG tablet TAKE 1 TABLET BY MOUTH TWICE DAILY AS NEEDED FOR  MODERATE  PAIN 08/20/22  Yes Eustaquio Boyden, MD  valsartan (DIOVAN) 320 MG tablet Take 1 tablet by mouth once daily 03/11/22  Yes Eustaquio Boyden, MD   Allergies  Allergen Reactions   Naproxen Itching    Social History   Tobacco Use   Smoking status: Never   Smokeless tobacco: Never  Substance Use Topics   Alcohol use: No    Alcohol/week: 0.0 standard drinks of alcohol    Family History  Problem Relation Age of Onset   Osteoarthritis Mother    Pulmonary fibrosis Mother 83   Arthritis Mother    Thyroid disease  Mother    Diabetes Father    Hypertension Father    Stroke Father 35   Hyperlipidemia Father    Cancer Sister        breast   CAD Sister    Cancer Sister        breast   Heart attack Sister    Thyroid disease Sister    Stroke Sister    Cervical cancer Sister    Luiz Blare' disease Sister    Sleep apnea Sister    Osteoarthritis Maternal Grandmother      Review of Systems  Positive ROS: neg  All other systems have been reviewed and were otherwise negative with the exception of those mentioned in the HPI and as above.  Objective: Vital signs in last 24 hours: Temp:  [98.8 F (37.1 C)] 98.8 F (37.1 C) (05/10 0810) Pulse Rate:  [66] 66 (05/10 0810) Resp:  [16] 16 (05/10 0810) BP: (141)/(57) 141/57 (05/10 0810) SpO2:  [94 %] 94 % (05/10 0810) Weight:  [72.6 kg] 72.6 kg (05/10 0810)  General Appearance: Alert, cooperative, no distress, appears stated age Head: Normocephalic, without obvious abnormality, atraumatic Eyes: PERRL, conjunctiva/corneas clear, EOM's intact    Neck: Supple, symmetrical, trachea midline Back: Symmetric, no curvature, ROM normal, no CVA tenderness Lungs:  respirations unlabored Heart: Regular rate and rhythm Abdomen: Soft, non-tender Extremities: Extremities normal, atraumatic, no cyanosis or edema Pulses: 2+ and symmetric all extremities Skin: Skin color, texture, turgor normal, no rashes or lesions  NEUROLOGIC:   Mental status: Alert and oriented x4,  no aphasia, good attention span, fund of knowledge, and memory Motor Exam - grossly normal Sensory Exam - grossly normal Reflexes: 1+ Coordination - grossly normal Gait - grossly normal Balance - grossly normal Cranial Nerves: I: smell Not tested  II: visual acuity  OS: nl    OD: nl  II: visual fields Full to confrontation  II: pupils Equal, round, reactive to light  III,VII: ptosis None  III,IV,VI: extraocular muscles  Full ROM  V: mastication Normal  V: facial light touch sensation   Normal  V,VII: corneal reflex  Present  VII: facial muscle function - upper  Normal  VII: facial muscle function - lower Normal  VIII: hearing Not tested  IX: soft palate elevation  Normal  IX,X: gag reflex Present  XI: trapezius strength  5/5  XI: sternocleidomastoid strength 5/5  XI: neck flexion strength  5/5  XII: tongue strength  Normal    Data Review Lab Results  Component Value Date   WBC 8.1 08/30/2022   HGB 12.5 08/30/2022   HCT 37.7 08/30/2022   MCV 95.4 08/30/2022   PLT  316 08/30/2022   Lab Results  Component Value Date   NA 137 08/30/2022   K 4.3 08/30/2022   CL 105 08/30/2022   CO2 21 (L) 08/30/2022   BUN 19 08/30/2022   CREATININE 1.30 (H) 08/30/2022   GLUCOSE 102 (H) 08/30/2022   Lab Results  Component Value Date   INR 1.1 08/30/2022    Assessment/Plan:  Estimated body mass index is 29.74 kg/m as calculated from the following:   Height as of this encounter: 5' 1.5" (1.562 m).   Weight as of this encounter: 72.6 kg. Patient admitted for decompression and instrumented fusion L3-4 L4-5. Patient has failed a reasonable attempt at conservative therapy.  I explained the condition and procedure to the patient and answered any questions.  Patient wishes to proceed with procedure as planned. Understands risks/ benefits and typical outcomes of procedure.   Tia Alert 09/03/2022 9:55 AM

## 2022-09-03 NOTE — Anesthesia Procedure Notes (Signed)
Procedure Name: Intubation Date/Time: 09/03/2022 10:45 AM  Performed by: Nadara Mustard, CRNAPre-anesthesia Checklist: Patient identified, Emergency Drugs available, Suction available and Patient being monitored Patient Re-evaluated:Patient Re-evaluated prior to induction Oxygen Delivery Method: Circle system utilized Preoxygenation: Pre-oxygenation with 100% oxygen Induction Type: IV induction and Rapid sequence Laryngoscope Size: Mac and 3 Grade View: Grade II Tube type: Oral Tube size: 7.0 mm Number of attempts: 1 Airway Equipment and Method: Stylet and Oral airway Placement Confirmation: ETT inserted through vocal cords under direct vision, positive ETCO2 and breath sounds checked- equal and bilateral Secured at: 20 cm Tube secured with: Tape Dental Injury: Teeth and Oropharynx as per pre-operative assessment  Comments: Elana Alm SRNA attempted laryngoscopy x1  but did attempt passing in ETT , [facility] CRNA successfully intubated x1 attempt

## 2022-09-03 NOTE — Progress Notes (Signed)
Patient ID: Carrie Lucas, female   DOB: April 23, 1941, 82 y.o.   MRN: [id number] Looks great post-op, moving well, pain well controlled, home in am

## 2022-09-03 NOTE — Op Note (Signed)
09/03/2022  1:25 PM  PATIENT:  Carrie Lucas  82 y.o. female  PRE-OPERATIVE DIAGNOSIS: Dynamic spondylolisthesis L3-4 L4-5, severe spinal stenosis L3-4, back pain with neurogenic claudication  POST-OPERATIVE DIAGNOSIS:  same  PROCEDURE:   1. Decompressive lumbar laminectomy, medial facetectomy and foraminotomies L3-4 bilaterally 2. Posterior fixation L3-L5 inclusive using ATEC cortical pedicle screws.  3. Intertransverse arthrodesis L3-4 L4-5 bilaterally using morcellized autograft and allograft.  SURGEON:  Marikay Alar, MD  ASSISTANTS: Verlin Dike, FNP  ANESTHESIA:  General  EBL: 250 ml  Total I/O In: 1000 [I.V.:1000] Out: 300 [Urine:50; Blood:250]  BLOOD ADMINISTERED: None  DRAINS: none   INDICATION FOR PROCEDURE: This patient presented with back pain with bilateral leg pain and numbness and tingling. Imaging revealed segmental instability with dynamic spondylolisthesis L3-4 and L4-5 with spinal stenosis at L3-4 and previous surgery at L4-5 on the left. The patient tried a reasonable attempt at conservative medical measures without relief. I recommended decompression and instrumented fusion to address the stenosis as well as the segmental  instability.  Patient understood the risks, benefits, and alternatives and potential outcomes and wished to proceed.  PROCEDURE DETAILS:  The patient was brought to the operating room. After induction of generalized endotracheal anesthesia the patient was rolled into the prone position on chest rolls and all pressure points were padded. The patient's lumbar region was cleaned and then prepped with DuraPrep and draped in the usual sterile fashion. Anesthesia was injected and then a dorsal midline incision was made and carried down to the lumbosacral fascia. The fascia was opened and the paraspinous musculature was taken down in a subperiosteal fashion to expose L3-4 and L4-5. A self-retaining retractor was placed. Intraoperative fluoroscopy  confirmed my level, and I started with placement of the bilateral L3, L4 and L5 cortical pedicle screws. The pedicle screw entry zones were identified utilizing surface landmarks and  AP and lateral fluoroscopy. I scored the cortex with the high-speed drill and then used the hand drill to drill an upward and outward direction into the pedicle. I then tapped line to line. I then placed a 5.5 x 40 mm cortical pedicle screw into the pedicles of L3 bilaterally and 6.5 x 45 mm cortical pedicle screws at L4 and L5 bilaterally.    I then turned my attention to the decompression and complete lumbar laminectomies, medial- facetectomies, and foraminotomies were performed at L3-4.  My nurse practitioner was directly involved in the decompression and exposure of the neural elements. the patient had significant spinal stenosis.  We undercut the lateral recess until we were distal to the L4 pedicles and the thecal sac was well decompressed as well as the L4 nerve roots.  We saved all bone during the decompression for later arthrodesis.    We then decorticated the transverse processes and laid a mixture of morcellized autograft and allograft out over these to perform intertransverse arthrodesis at L3-L4 and L5 bilaterally. We then placed lordotic rods into the multiaxial screw heads of the pedicle screws and locked these in position with the locking caps and anti-torque device. We then checked our construct with AP and lateral fluoroscopy. Irrigated with copious amounts of 0.5% povidone iodine solution followed by saline solution. Inspected the nerve roots once again to assure adequate decompression, lined to the dura with Gelfoam,  and then we closed the muscle and the fascia with 0 Vicryl. Closed the subcutaneous tissues with 2-0 Vicryl and subcuticular tissues with 3-0 Vicryl. The skin was closed with benzoin and Steri-Strips. Dressing  was then applied, the patient was awakened from general anesthesia and transported to the  recovery room in stable condition. At the end of the procedure all sponge, needle and instrument counts were correct.   PLAN OF CARE: admit to inpatient  PATIENT DISPOSITION:  PACU - hemodynamically stable.   Delay start of Pharmacological VTE agent (>24hrs) due to surgical blood loss or risk of bleeding:  yes

## 2022-09-04 DIAGNOSIS — M4316 Spondylolisthesis, lumbar region: Secondary | ICD-10-CM | POA: Diagnosis not present

## 2022-09-04 DIAGNOSIS — I129 Hypertensive chronic kidney disease with stage 1 through stage 4 chronic kidney disease, or unspecified chronic kidney disease: Secondary | ICD-10-CM | POA: Diagnosis not present

## 2022-09-04 DIAGNOSIS — Z85828 Personal history of other malignant neoplasm of skin: Secondary | ICD-10-CM | POA: Diagnosis not present

## 2022-09-04 DIAGNOSIS — M48062 Spinal stenosis, lumbar region with neurogenic claudication: Secondary | ICD-10-CM | POA: Diagnosis not present

## 2022-09-04 DIAGNOSIS — Z7982 Long term (current) use of aspirin: Secondary | ICD-10-CM | POA: Diagnosis not present

## 2022-09-04 DIAGNOSIS — N183 Chronic kidney disease, stage 3 unspecified: Secondary | ICD-10-CM | POA: Diagnosis not present

## 2022-09-04 MED ORDER — METHOCARBAMOL 500 MG PO TABS: 500.0000 mg | ORAL_TABLET | Freq: Three times a day (TID) | ORAL | 1 refills | Status: DC | PRN

## 2022-09-04 MED ORDER — HYDROCODONE-ACETAMINOPHEN 5-325 MG PO TABS: 1.0000 | ORAL_TABLET | ORAL | 0 refills | Status: DC | PRN

## 2022-09-04 MED ORDER — BISACODYL 5 MG PO TBEC
5.0000 mg | DELAYED_RELEASE_TABLET | Freq: Every day | ORAL | Status: DC | PRN
  Administered 2022-09-04: 5 mg via ORAL
  Filled 2022-09-04: qty 1

## 2022-09-04 NOTE — Evaluation (Signed)
Occupational Therapy Evaluation Patient Details Name: Carrie Lucas MRN: [id number] DOB: [birth year] Today's Date: 09/04/2022   History of Present Illness Pt admitted 09/03/22 for L3-4, L4-5 fusion. PMH significant for but not limited to: HTN, OA, previous back surgery in 1995.   Clinical Impression   Pt admitted with the above diagnoses. At baseline, pt is mod I/Independent with ADLs. Pt currently is supervision to min guard with functional transfers. Pt with difficulty accessing feet in seated position without bending during LB ADLs; spouse assisting as needed also discussed AE. All back education completed in regards to precautions, brace, and compensatory strategies with ADLs.      Recommendations for follow up therapy are one component of a multi-disciplinary discharge planning process, led by the attending physician.  Recommendations may be updated based on patient status, additional functional criteria and insurance authorization.   Assistance Recommended at Discharge Set up Supervision/Assistance  Patient can return home with the following A little help with bathing/dressing/bathroom    Functional Status Assessment  Patient has had a recent decline in their functional status and demonstrates the ability to make significant improvements in function in a reasonable and predictable amount of time.  Equipment Recommendations  None recommended by OT (pt plans to borrow recommended DME from friends)    Recommendations for Other Services       Precautions / Restrictions Precautions Precautions: Back Precaution Booklet Issued: Yes (comment) Precaution Comments: Pt able to teach back No BLT Required Braces or Orthoses: Spinal Brace Spinal Brace: Lumbar corset;Applied in sitting position Restrictions Weight Bearing Restrictions: No      Mobility Bed Mobility Overal bed mobility: Modified Independent                  Transfers Overall transfer level: Needs  assistance Equipment used: None Transfers: Sit to/from Stand Sit to Stand: Min guard           General transfer comment: min guard initially then supervision. to/from EOB and to recliner      Balance Overall balance assessment: Mild deficits observed, not formally tested                                         ADL either performed or assessed with clinical judgement   ADL Overall ADL's : Needs assistance/impaired Eating/Feeding: Set up   Grooming: Set up   Upper Body Bathing: Set up   Lower Body Bathing: Minimal assistance;Min guard;Sit to/from stand   Upper Body Dressing : Set up   Lower Body Dressing: Min guard;Minimal assistance;Sit to/from stand   Toilet Transfer: Supervision/safety   Toileting- Architect and Hygiene: Min guard;Sit to/from stand;Cueing for compensatory techniques;Minimal assistance   Tub/ Shower Transfer: Walk-in shower;Supervision/safety   Functional mobility during ADLs: Supervision/safety General ADL Comments: Pt with difficulty accessing feet in seted position without bending. Spouse present and assisted with getting LB dressing started. Discussed AE including reacher and tings for pericare.     Vision         Perception     Praxis      Pertinent Vitals/Pain Pain Assessment Pain Assessment: Faces Faces Pain Scale: Hurts a little bit Pain Location: low back Pain Descriptors / Indicators: Aching Pain Intervention(s): Monitored during session     Hand Dominance     Extremity/Trunk Assessment Upper Extremity Assessment Upper Extremity Assessment: Overall WFL for tasks assessed   Lower Extremity Assessment  Lower Extremity Assessment: Defer to PT evaluation   Cervical / Trunk Assessment Cervical / Trunk Assessment: Back Surgery   Communication Communication Communication: HOH   Cognition Arousal/Alertness: Awake/alert Behavior During Therapy: WFL for tasks assessed/performed Overall Cognitive  Status: Within Functional Limits for tasks assessed                                       General Comments  husband present throughout sesison and very supportive    Exercises     Shoulder Instructions      Home Living Family/patient expects to be discharged to:: Private residence Living Arrangements: Spouse/significant other;Children;Other relatives Available Help at Discharge: Family;Available 24 hours/day Type of Home: House Home Access: Stairs to enter [employer] of Steps: 5 Entrance Stairs-Rails: Right Home Layout: One level               Home Equipment:  (Can borrow a cane and RW and shower seat)          Prior Functioning/Environment Prior Level of Function : Independent/Modified Independent                        OT Problem List:        OT Treatment/Interventions:      OT Goals(Current goals can be found in the care plan section) Acute Rehab OT Goals Patient Stated Goal: home  OT Frequency:      Co-evaluation              AM-PAC OT "6 Clicks" Daily Activity     Outcome Measure Help from another person eating meals?: None Help from another person taking care of personal grooming?: None Help from another person toileting, which includes using toliet, bedpan, or urinal?: A Little Help from another person bathing (including washing, rinsing, drying)?: A Little Help from another person to put on and taking off regular upper body clothing?: None Help from another person to put on and taking off regular lower body clothing?: A Little 6 Click Score: 21   End of Session Equipment Utilized During Treatment: Other (comment) (no DME utilized to mobilize in the room, noted rw utilized for out of room mobiliy with PT)  Activity Tolerance: Patient tolerated treatment well Patient left: in chair;with call bell/phone within reach;with family/visitor present  OT Visit Diagnosis: Unsteadiness on feet (R26.81);Pain                 Time: 1610-9604 OT Time Calculation (min): 20 min Charges:  OT General Charges $OT Visit: 1 Visit OT Evaluation $OT Eval Low Complexity: 1 Low  Raynald Kemp, OT Acute Rehabilitation Services Office: [phone]   Pilar Grammes 09/04/2022, 10:23 AM

## 2022-09-04 NOTE — Discharge Summary (Signed)
Physician Discharge Summary  Patient ID: Carrie Lucas MRN: [id number] DOB/AGE: 82/28/1942 82 y.o.  Admit date: 09/03/2022 Discharge date: 09/04/2022  Admission Diagnoses: Spondylolisthesis with stenosis L3-4 L4-5    Discharge Diagnoses: Same   Discharged Condition: good  Hospital Course: The patient was admitted on 09/03/2022 and taken to the operating room where the patient underwent decompression instrumented fusion L3-4 L4-5. The patient tolerated the procedure well and was taken to the recovery room and then to the floor in stable condition. The hospital course was routine. There were no complications. The wound remained clean dry and intact. Pt had appropriate back soreness. No complaints of leg pain or new N/T/W. The patient remained afebrile with stable vital signs, and tolerated a regular diet. The patient continued to increase activities, and pain was well controlled with oral pain medications.   Consults: None  Significant Diagnostic Studies:  Results for orders placed or performed during the hospital encounter of 08/30/22  Surgical pcr screen   Specimen: Nasal Mucosa; Nasal Swab  Result Value Ref Range   MRSA, PCR NEGATIVE NEGATIVE   Staphylococcus aureus POSITIVE (A) NEGATIVE  Protime-INR  Result Value Ref Range   Prothrombin Time 13.9 11.4 - 15.2 seconds   INR 1.1 0.8 - 1.2  Comprehensive metabolic panel per protocol  Result Value Ref Range   Sodium 137 135 - 145 mmol/L   Potassium 4.3 3.5 - 5.1 mmol/L   Chloride 105 98 - 111 mmol/L   CO2 21 (L) 22 - 32 mmol/L   Glucose, Bld 102 (H) 70 - 99 mg/dL   BUN 19 8 - 23 mg/dL   Creatinine, Ser 4.09 (H) 0.44 - 1.00 mg/dL   Calcium 9.5 8.9 - 81.1 mg/dL   Total Protein 7.1 6.5 - 8.1 g/dL   Albumin 4.1 3.5 - 5.0 g/dL   AST 18 15 - 41 U/L   ALT 14 0 - 44 U/L   Alkaline Phosphatase 84 38 - 126 U/L   Total Bilirubin 0.4 0.3 - 1.2 mg/dL   GFR, Estimated 41 (L) >60 mL/min   Anion gap 11 5 - 15  CBC per protocol  Result  Value Ref Range   WBC 8.1 4.0 - 10.5 K/uL   RBC 3.95 3.87 - 5.11 MIL/uL   Hemoglobin 12.5 12.0 - 15.0 g/dL   HCT 91.4 78.2 - 95.6 %   MCV 95.4 80.0 - 100.0 fL   MCH 31.6 26.0 - 34.0 pg   MCHC 33.2 30.0 - 36.0 g/dL   RDW 21.3 08.6 - 57.8 %   Platelets 316 150 - 400 K/uL   nRBC 0.0 0.0 - 0.2 %  Type and screen [facility]  Result Value Ref Range   ABO/RH(D) O POS    Antibody Screen NEG    Sample Expiration 09/13/2022,2359    Extend sample reason      NO TRANSFUSIONS OR PREGNANCY IN THE PAST 3 MONTHS Performed at [facility], [address], [place], [place] [address]     DG Lumbar Spine 2-3 Views  Result Date: 09/03/2022 CLINICAL DATA:  [id number] Surgery, elective [id number] EXAM: LUMBAR SPINE - 2-3 VIEW COMPARISON:  MRI 12/24/2021 FINDINGS: Intraoperative images during L3-L5 fusion. Pedicle screws are in place. Posterior fusion rods are not in place. IMPRESSION: Intraoperative images during L3-L5 lumbar fusion. Electronically Signed   By: Caprice Renshaw M.D.   On: 09/03/2022 13:55   DG C-Arm 1-60 Min-No Report  Result Date: 09/03/2022 Fluoroscopy was utilized by  the requesting physician.  No radiographic interpretation.   DG C-Arm 1-60 Min-No Report  Result Date: 09/03/2022 Fluoroscopy was utilized by the requesting physician.  No radiographic interpretation.    Antibiotics:  Anti-infectives (From admission, onward)    Start     Dose/Rate Route Frequency Ordered Stop   09/03/22 1615  ceFAZolin (ANCEF) IVPB 2g/100 mL premix        2 g 200 mL/hr over 30 Minutes Intravenous Every 8 hours 09/03/22 1524 09/04/22 0029   09/03/22 0830  ceFAZolin (ANCEF) IVPB 2g/100 mL premix        2 g 200 mL/hr over 30 Minutes Intravenous On call to O.R. 09/03/22 0818 09/03/22 1048       Discharge Exam: Blood pressure (!) 119/59, pulse 69, temperature 98.4 F (36.9 C), temperature source Oral, resp. rate 18, height 5' 1.5" (1.562 m), weight 72.6 kg, SpO2 92 %. Neurologic:  Grossly normal Dressing clean dry and intact  Discharge Medications:   Allergies as of 09/04/2022       Reactions   Naproxen Itching        Medication List     TAKE these medications    acetaminophen 650 MG CR tablet Commonly known as: TYLENOL Take 1 tablet (650 mg total) by mouth 3 (three) times daily as needed for pain.   amLODipine 5 MG tablet Commonly known as: NORVASC Take 1 tablet (5 mg total) by mouth daily.   aspirin EC 81 MG tablet Take 1 tablet (81 mg total) by mouth every Monday, Wednesday, and Friday.   atorvastatin 20 MG tablet Commonly known as: LIPITOR Take 1 tablet by mouth once daily   cyanocobalamin 1000 MCG tablet Take 1 tablet (1,000 mcg total) by mouth every Monday, Wednesday, and Friday.   HYDROcodone-acetaminophen 5-325 MG tablet Commonly known as: NORCO/VICODIN Take 1 tablet by mouth every 4 (four) hours as needed for moderate pain ((score 4 to 6)).   methocarbamol 500 MG tablet Commonly known as: ROBAXIN Take 1 tablet (500 mg total) by mouth every 8 (eight) hours as needed for muscle spasms.   metoprolol succinate 25 MG 24 hr tablet Commonly known as: TOPROL-XL Take 1/2 (one-half) tablet by mouth once daily   traMADol 50 MG tablet Commonly known as: ULTRAM TAKE 1 TABLET BY MOUTH TWICE DAILY AS NEEDED FOR  MODERATE  PAIN   valsartan 320 MG tablet Commonly known as: DIOVAN Take 1 tablet by mouth once daily   Vitamin D3 1000 units Caps Take 1,000 Units by mouth daily.               Durable Medical Equipment  (From admission, onward)           Start     Ordered   09/03/22 1525  DME Walker rolling  Once       Question:  Patient needs a walker to treat with the following condition  Answer:  S/P lumbar fusion   09/03/22 1524   09/03/22 1525  DME 3 n 1  Once        09/03/22 1524            Disposition: Home   Final Dx: Decompression and instrumented fusion L3-4 L4-5  Discharge Instructions      Remove dressing  in 72 hours   Complete by: As directed    Call MD for:  difficulty breathing, headache or visual disturbances   Complete by: As directed    Call MD for:  persistant nausea and vomiting  Complete by: As directed    Call MD for:  redness, tenderness, or signs of infection (pain, swelling, redness, odor or green/yellow discharge around incision site)   Complete by: As directed    Call MD for:  severe uncontrolled pain   Complete by: As directed    Call MD for:  temperature >100.4   Complete by: As directed    Diet - low sodium heart healthy   Complete by: As directed    Discharge instructions   Complete by: As directed    Lifting more than 8 pounds, may shower, no driving   Increase activity slowly   Complete by: As directed         Follow-up Information     Tia Alert, MD. Schedule an appointment as soon as possible for a visit in 2 week(s).   Specialty: Neurosurgery Contact information: [address] [place] [address] [phone]                  Signed: Tia Alert 09/04/2022, 7:57 AM

## 2022-09-04 NOTE — Evaluation (Signed)
Physical Therapy Evaluation Patient Details Name: Carrie Lucas MRN: [id number] DOB: 1940-08-12 Today's Date: 09/04/2022  History of Present Illness  Pt admitted 09/03/22 for L3-4, L4-5 fusion. PMH significant for but not limited to: HTN, OA, previous back surgery in 1995.  Clinical Impression  Patient is s/p above surgery resulting in the deficits listed below. Pt mobilizing well and will have assist form husband at DC. I have encouraged the patient to gradually increase activity daily to tolerance.   I have answered all patient's question regarding PT and mobility.    Pt feels ready for DC home today.   Pt will borrow RW and cane.       Recommendations for follow up therapy are one component of a multi-disciplinary discharge planning process, led by the attending physician.  Recommendations may be updated based on patient status, additional functional criteria and insurance authorization.  Follow Up Recommendations       Assistance Recommended at Discharge PRN  Patient can return home with the following       Equipment Recommendations Other (comment) (Pt to borrow RW and cane)  Recommendations for Other Services  OT consult    Functional Status Assessment Patient has had a recent decline in their functional status and demonstrates the ability to make significant improvements in function in a reasonable and predictable amount of time.     Precautions / Restrictions Precautions Precautions: Back Precaution Booklet Issued: Yes (comment) Precaution Comments: Pt able to teach back No BLT Required Braces or Orthoses: Spinal Brace Spinal Brace: Lumbar corset;Applied in sitting position Restrictions Weight Bearing Restrictions: No      Mobility  Bed Mobility Overal bed mobility: Modified Independent (instructed in log rolling)                  Transfers Overall transfer level: Needs assistance Equipment used: None Transfers: Sit to/from Stand Sit to Stand:  Supervision           General transfer comment: instructions to not pull up on RW    Ambulation/Gait Ambulation/Gait assistance: Supervision Gait Distance (Feet): 120 Feet (walked 120 with no device, then 60 feet with RW) Assistive device: Rolling walker (2 wheels) Gait Pattern/deviations:  (without AD pt had occassional stagger to each side, ut was able to self correct. When using RW she was much steadier)       General Gait Details: Pt and husband agreed to use RW at home at least for next several days as they agreed she was steadier with RW than without device  Stairs Stairs: Yes Stairs assistance: Min guard Stair Management: One rail Right, Forwards, Step to pattern Number of Stairs: 5 General stair comments: instructed to go up with stronger leg first and down with eaker leg first  Wheelchair Mobility    Modified Rankin (Stroke Patients Only)       Balance Overall balance assessment: Mild deficits observed, not formally tested                                           Pertinent Vitals/Pain Pain Assessment Pain Assessment: 0-10 Pain Score: 3  Pain Location: low back Pain Descriptors / Indicators: Aching Pain Intervention(s): Limited activity within patient's tolerance, Monitored during session    Home Living Family/patient expects to be discharged to:: Private residence Living Arrangements: Spouse/significant other;Children;Other relatives Available Help at Discharge: Family;Available 24 hours/day Type of Home:  House Home Access: Stairs to enter Entrance Stairs-Rails: Right Entrance Stairs-Number of Steps: 5   Home Layout: One level Home Equipment: None (Can borrow a cane and RW)      Prior Function Prior Level of Function : Independent/Modified Independent                     Hand Dominance        Extremity/Trunk Assessment   Upper Extremity Assessment Upper Extremity Assessment: Overall WFL for tasks assessed     Lower Extremity Assessment Lower Extremity Assessment: Generalized weakness    Cervical / Trunk Assessment Cervical / Trunk Assessment: Back Surgery  Communication   Communication: HOH  Cognition Arousal/Alertness: Awake/alert Behavior During Therapy: WFL for tasks assessed/performed Overall Cognitive Status: Within Functional Limits for tasks assessed                                          General Comments General comments (skin integrity, edema, etc.): husband present throughout sesison and very supportive    Exercises     Assessment/Plan    PT Assessment Patient does not need any further PT services  PT Problem List         PT Treatment Interventions      PT Goals (Current goals can be found in the Care Plan section)  Acute Rehab PT Goals Patient Stated Goal: to go home today    Frequency       Co-evaluation               AM-PAC PT "6 Clicks" Mobility  Outcome Measure Help needed turning from your back to your side while in a flat bed without using bedrails?: None Help needed moving from lying on your back to sitting on the side of a flat bed without using bedrails?: None Help needed moving to and from a bed to a chair (including a wheelchair)?: A Little Help needed standing up from a chair using your arms (e.g., wheelchair or bedside chair)?: A Little Help needed to walk in hospital room?: A Little Help needed climbing 3-5 steps with a railing? : A Little 6 Click Score: 20    End of Session Equipment Utilized During Treatment: Gait belt;Back brace Activity Tolerance: Patient tolerated treatment well Patient left: in bed;with call bell/phone within reach;with family/visitor present Nurse Communication: Mobility status PT Visit Diagnosis: Unsteadiness on feet (R26.81)    Time: 1610-9604 PT Time Calculation (min) (ACUTE ONLY): 21 min   Charges:   PT Evaluation $PT Eval Low Complexity: 1 Low          Lavona Mound, PT    Acute Rehabilitation Services  Office [phone] 09/04/2022   Donnella Sham 09/04/2022, 9:39 AM

## 2022-09-04 NOTE — Progress Notes (Addendum)
Explained discharge instructions to patient. Reviewed follow up appointment and next medication administration times. Also reviewed education. Patient verbalized having an understanding for instructions given. All belongings are in the patient's possession. IV was removed by nightshift RN. No other needs verbalized. Transported downstairs for discharge. 

## 2022-09-04 NOTE — Care Management (Signed)
Patient with order to DC to home today. Unit staff to provide DME needed for home.   No HH needs identified Patient will have family/ friends provide transportation home. No other TOC needs identified for DC 

## 2022-09-04 NOTE — Plan of Care (Signed)

## 2022-09-06 ENCOUNTER — Encounter (HOSPITAL_COMMUNITY): Payer: Self-pay | Admitting: Neurological Surgery

## 2022-10-11 IMAGING — MG MM DIGITAL SCREENING BILAT W/ TOMO AND CAD
8 series · 8 of 24 positions shown · non-contrast
Comparison: Previous exam(s).

CLINICAL DATA: Screening.

EXAM:
DIGITAL SCREENING BILATERAL MAMMOGRAM WITH TOMO AND CAD

[L MLO synth-2D]
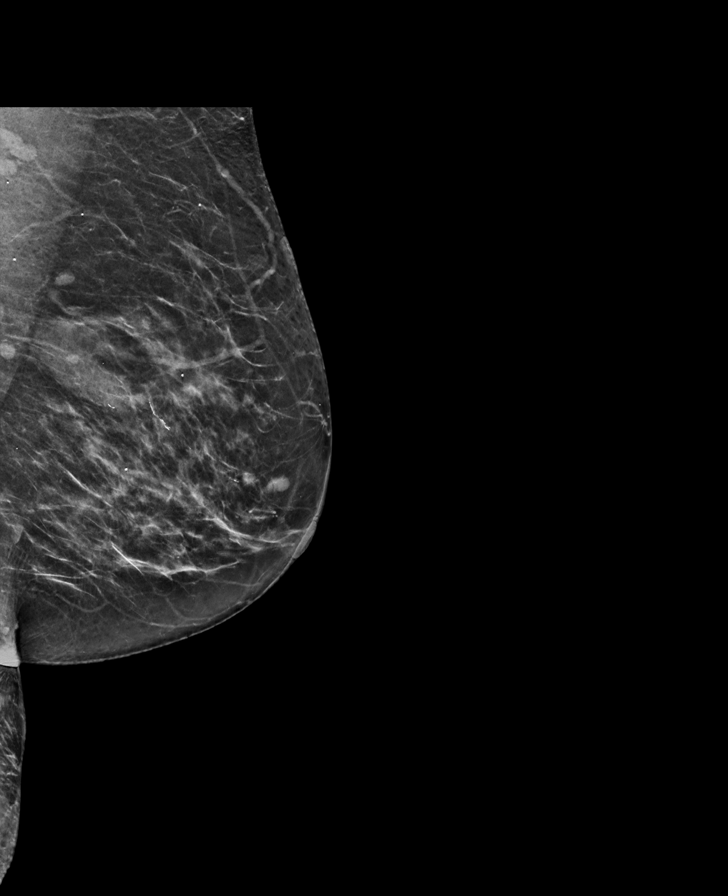

[R CC synth-2D]
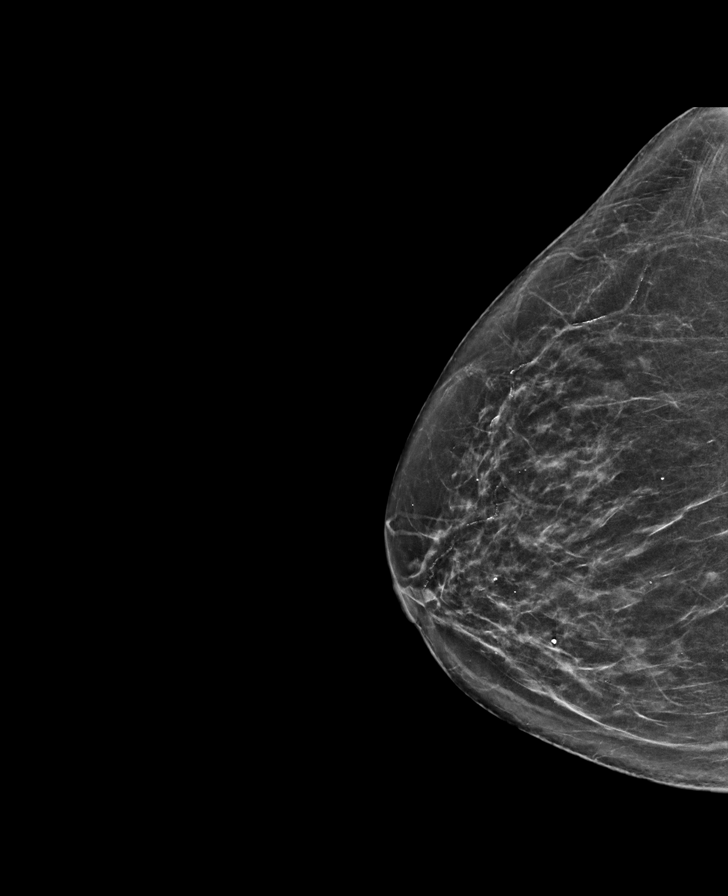

[L CC synth-2D]
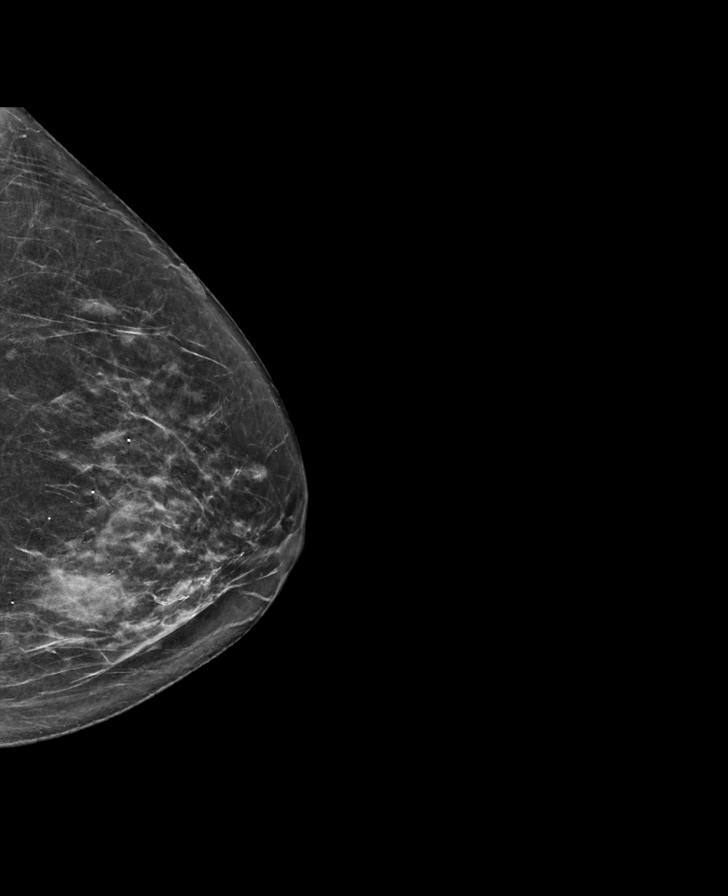

[R MLO synth-2D]
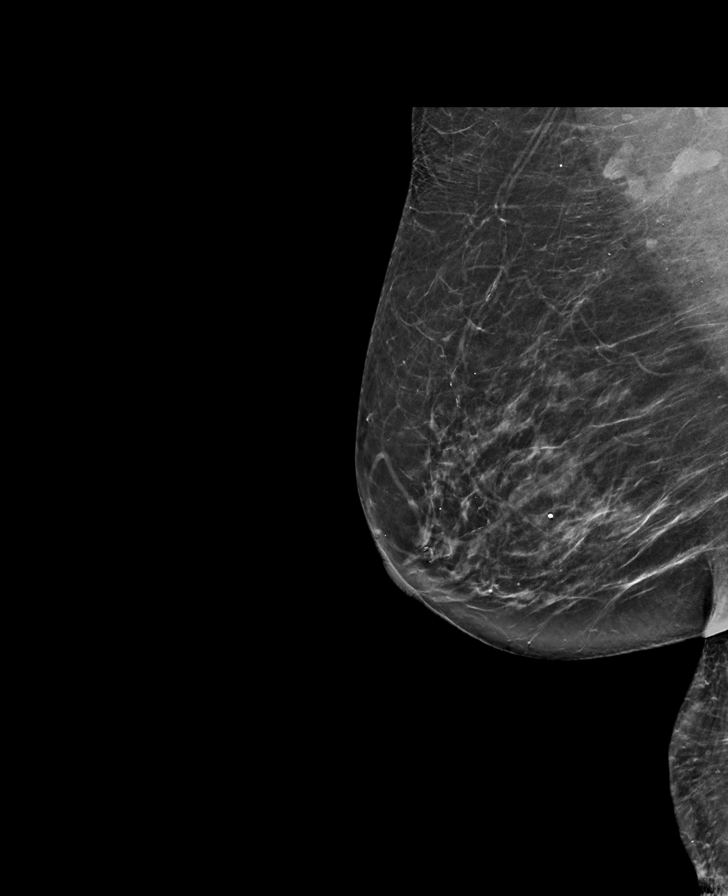

[R CC tomo · tomo slice 31/62.0]
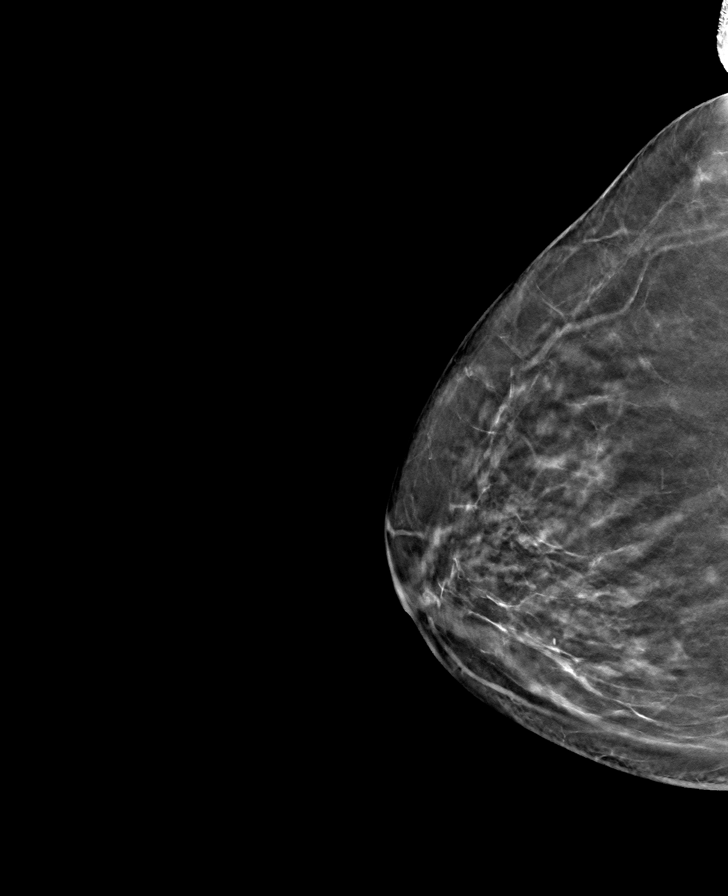

[R MLO tomo · tomo slice 33/66.0]
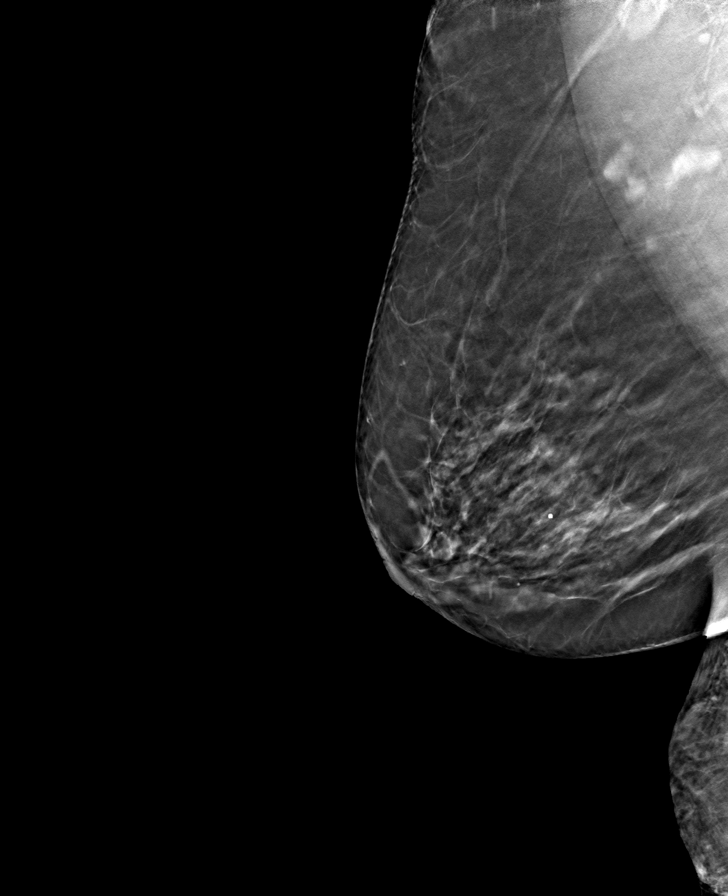

[L MLO tomo · tomo slice 32/63.0]
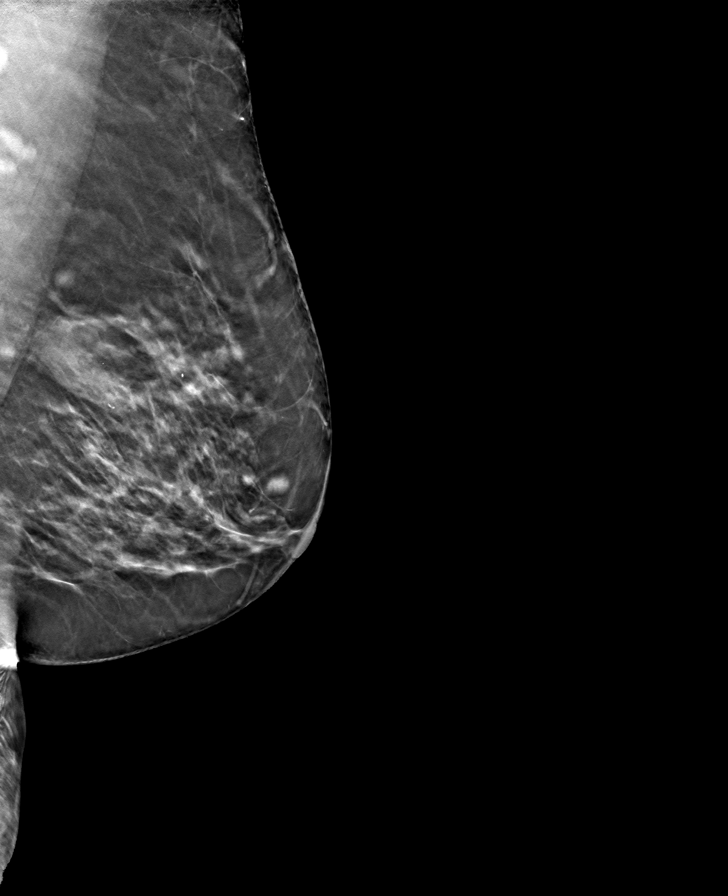

[L CC tomo · tomo slice 33/64.0]
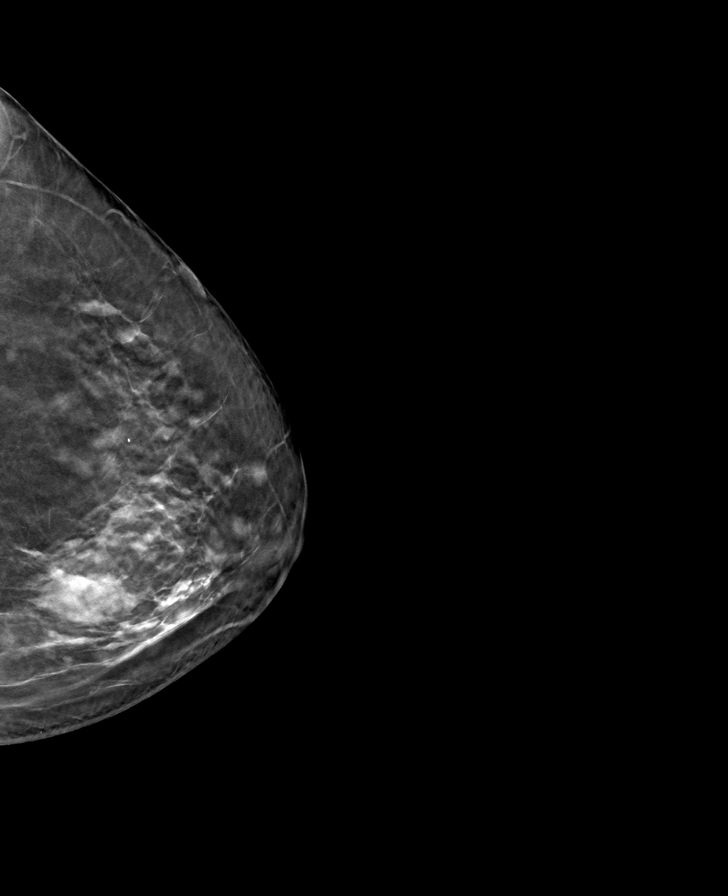

[8 of 24 positions shown; findings below may reference images not displayed]

ACR Breast Density Category c: The breast tissue is heterogeneously
dense, which may obscure small masses.
FINDINGS: There are no findings suspicious for malignancy. The images were
evaluated with computer-aided detection.
IMPRESSION: No mammographic evidence of malignancy. A result letter of this
screening mammogram will be mailed directly to the patient.

RECOMMENDATION:
Screening mammogram in one year. (Code:[id number])

BI-RADS CATEGORY  1: Negative.

## 2022-10-19 DIAGNOSIS — Z6829 Body mass index (BMI) 29.0-29.9, adult: Secondary | ICD-10-CM | POA: Diagnosis not present

## 2022-10-19 DIAGNOSIS — M4316 Spondylolisthesis, lumbar region: Secondary | ICD-10-CM | POA: Diagnosis not present

## 2022-12-09 ENCOUNTER — Encounter (INDEPENDENT_AMBULATORY_CARE_PROVIDER_SITE_OTHER): Payer: Self-pay

## 2022-12-21 DIAGNOSIS — M4316 Spondylolisthesis, lumbar region: Secondary | ICD-10-CM | POA: Diagnosis not present

## 2022-12-21 DIAGNOSIS — N1831 Chronic kidney disease, stage 3a: Secondary | ICD-10-CM | POA: Diagnosis not present

## 2022-12-21 DIAGNOSIS — Z6829 Body mass index (BMI) 29.0-29.9, adult: Secondary | ICD-10-CM | POA: Diagnosis not present

## 2022-12-30 DIAGNOSIS — I129 Hypertensive chronic kidney disease with stage 1 through stage 4 chronic kidney disease, or unspecified chronic kidney disease: Secondary | ICD-10-CM | POA: Diagnosis not present

## 2022-12-30 DIAGNOSIS — N1832 Chronic kidney disease, stage 3b: Secondary | ICD-10-CM | POA: Diagnosis not present

## 2022-12-30 DIAGNOSIS — N281 Cyst of kidney, acquired: Secondary | ICD-10-CM | POA: Diagnosis not present

## 2022-12-30 DIAGNOSIS — H8110 Benign paroxysmal vertigo, unspecified ear: Secondary | ICD-10-CM | POA: Diagnosis not present

## 2022-12-30 DIAGNOSIS — M8580 Other specified disorders of bone density and structure, unspecified site: Secondary | ICD-10-CM | POA: Diagnosis not present

## 2022-12-30 DIAGNOSIS — K589 Irritable bowel syndrome without diarrhea: Secondary | ICD-10-CM | POA: Diagnosis not present

## 2022-12-30 DIAGNOSIS — N2581 Secondary hyperparathyroidism of renal origin: Secondary | ICD-10-CM | POA: Diagnosis not present

## 2022-12-30 DIAGNOSIS — R739 Hyperglycemia, unspecified: Secondary | ICD-10-CM | POA: Diagnosis not present

## 2023-01-13 ENCOUNTER — Other Ambulatory Visit: Payer: Self-pay | Admitting: Family Medicine

## 2023-01-13 DIAGNOSIS — E538 Deficiency of other specified B group vitamins: Secondary | ICD-10-CM

## 2023-01-13 DIAGNOSIS — E782 Mixed hyperlipidemia: Secondary | ICD-10-CM

## 2023-01-13 DIAGNOSIS — N1832 Chronic kidney disease, stage 3b: Secondary | ICD-10-CM

## 2023-01-14 ENCOUNTER — Other Ambulatory Visit (INDEPENDENT_AMBULATORY_CARE_PROVIDER_SITE_OTHER): Payer: Medicare Other

## 2023-01-14 DIAGNOSIS — N1832 Chronic kidney disease, stage 3b: Secondary | ICD-10-CM | POA: Diagnosis not present

## 2023-01-14 DIAGNOSIS — E538 Deficiency of other specified B group vitamins: Secondary | ICD-10-CM | POA: Diagnosis not present

## 2023-01-14 DIAGNOSIS — E782 Mixed hyperlipidemia: Secondary | ICD-10-CM | POA: Diagnosis not present

## 2023-01-14 LAB — COMPREHENSIVE METABOLIC PANEL
ALT: 13 U/L (ref 0–35)
AST: 15 U/L (ref 0–37)
Albumin: 4 g/dL (ref 3.5–5.2)
Alkaline Phosphatase: 93 U/L (ref 39–117)
BUN: 25 mg/dL — ABNORMAL HIGH (ref 6–23)
CO2: 21 mEq/L (ref 19–32)
Calcium: 9.3 mg/dL (ref 8.4–10.5)
Chloride: 106 mEq/L (ref 96–112)
Creatinine, Ser: 1.33 mg/dL — ABNORMAL HIGH (ref 0.40–1.20)
GFR: 37.26 mL/min — ABNORMAL LOW (ref >60.00)
Glucose, Bld: 101 mg/dL — ABNORMAL HIGH (ref 70–99)
Potassium: 4.1 mEq/L (ref 3.5–5.1)
Sodium: 139 mEq/L (ref 135–145)
Total Bilirubin: 0.4 mg/dL (ref 0.2–1.2)
Total Protein: 6.9 g/dL (ref 6.0–8.3)

## 2023-01-14 LAB — CBC WITH DIFFERENTIAL/PLATELET
Basophils Absolute: 0.1 10*3/uL (ref 0.0–0.1)
Basophils Relative: 1 % (ref 0.0–3.0)
Eosinophils Absolute: 0.2 10*3/uL (ref 0.0–0.7)
Eosinophils Relative: 2.9 % (ref 0.0–5.0)
HCT: 35.2 % — ABNORMAL LOW (ref 36.0–46.0)
Hemoglobin: 11.5 g/dL — ABNORMAL LOW (ref 12.0–15.0)
Lymphocytes Relative: 51.9 % — ABNORMAL HIGH (ref 12.0–46.0)
Lymphs Abs: 4.5 10*3/uL — ABNORMAL HIGH (ref 0.7–4.0)
MCHC: 32.7 g/dL (ref 30.0–36.0)
MCV: 91 fl (ref 78.0–100.0)
Monocytes Absolute: 0.7 10*3/uL (ref 0.1–1.0)
Monocytes Relative: 8.5 % (ref 3.0–12.0)
Neutro Abs: 3.1 10*3/uL (ref 1.4–7.7)
Neutrophils Relative %: 35.7 % — ABNORMAL LOW (ref 43.0–77.0)
Platelets: 348 10*3/uL (ref 150.0–400.0)
RBC: 3.86 Mil/uL — ABNORMAL LOW (ref 3.87–5.11)
RDW: 13.6 % (ref 11.5–15.5)
WBC: 8.7 10*3/uL (ref 4.0–10.5)

## 2023-01-14 LAB — MICROALBUMIN / CREATININE URINE RATIO
Creatinine,U: 107.6 mg/dL
Microalb Creat Ratio: 1.5 mg/g (ref 0.0–30.0)
Microalb, Ur: 1.6 mg/dL (ref 0.0–1.9)

## 2023-01-14 LAB — VITAMIN D 25 HYDROXY (VIT D DEFICIENCY, FRACTURES): VITD: 38.69 ng/mL (ref 30.00–100.00)

## 2023-01-14 LAB — LIPID PANEL
Cholesterol: 115 mg/dL (ref 0–200)
HDL: 36.6 mg/dL — ABNORMAL LOW (ref >39.00)
LDL Cholesterol: 47 mg/dL (ref 0–99)
NonHDL: 78.56
Total CHOL/HDL Ratio: 3
Triglycerides: 158 mg/dL — ABNORMAL HIGH (ref 0.0–149.0)
VLDL: 31.6 mg/dL (ref 0.0–40.0)

## 2023-01-14 LAB — PHOSPHORUS: Phosphorus: 4.2 mg/dL (ref 2.3–4.6)

## 2023-01-14 LAB — VITAMIN B12: Vitamin B-12: 821 pg/mL (ref 211–911)

## 2023-01-15 LAB — PARATHYROID HORMONE, INTACT (NO CA): PTH: 83 pg/mL — ABNORMAL HIGH (ref 16–77)

## 2023-01-19 ENCOUNTER — Ambulatory Visit: Payer: Medicare Other | Admitting: Family Medicine

## 2023-01-21 ENCOUNTER — Ambulatory Visit (INDEPENDENT_AMBULATORY_CARE_PROVIDER_SITE_OTHER): Payer: Medicare Other | Admitting: Family Medicine

## 2023-01-21 ENCOUNTER — Encounter: Payer: Self-pay | Admitting: Family Medicine

## 2023-01-21 VITALS — BP 126/64 | HR 70 | Temp 98.4°F | Ht 61.25 in | Wt 147.5 lb

## 2023-01-21 DIAGNOSIS — E538 Deficiency of other specified B group vitamins: Secondary | ICD-10-CM | POA: Diagnosis not present

## 2023-01-21 DIAGNOSIS — K581 Irritable bowel syndrome with constipation: Secondary | ICD-10-CM

## 2023-01-21 DIAGNOSIS — D72820 Lymphocytosis (symptomatic): Secondary | ICD-10-CM

## 2023-01-21 DIAGNOSIS — N3941 Urge incontinence: Secondary | ICD-10-CM | POA: Insufficient documentation

## 2023-01-21 DIAGNOSIS — I10 Essential (primary) hypertension: Secondary | ICD-10-CM | POA: Diagnosis not present

## 2023-01-21 DIAGNOSIS — E782 Mixed hyperlipidemia: Secondary | ICD-10-CM | POA: Diagnosis not present

## 2023-01-21 DIAGNOSIS — M154 Erosive (osteo)arthritis: Secondary | ICD-10-CM

## 2023-01-21 DIAGNOSIS — R5382 Chronic fatigue, unspecified: Secondary | ICD-10-CM | POA: Diagnosis not present

## 2023-01-21 DIAGNOSIS — Z23 Encounter for immunization: Secondary | ICD-10-CM

## 2023-01-21 DIAGNOSIS — D631 Anemia in chronic kidney disease: Secondary | ICD-10-CM | POA: Insufficient documentation

## 2023-01-21 DIAGNOSIS — M5136 Other intervertebral disc degeneration, lumbar region: Secondary | ICD-10-CM

## 2023-01-21 DIAGNOSIS — M8580 Other specified disorders of bone density and structure, unspecified site: Secondary | ICD-10-CM | POA: Diagnosis not present

## 2023-01-21 DIAGNOSIS — Z7189 Other specified counseling: Secondary | ICD-10-CM | POA: Diagnosis not present

## 2023-01-21 DIAGNOSIS — Z981 Arthrodesis status: Secondary | ICD-10-CM

## 2023-01-21 DIAGNOSIS — N1832 Chronic kidney disease, stage 3b: Secondary | ICD-10-CM | POA: Diagnosis not present

## 2023-01-21 DIAGNOSIS — Z0000 Encounter for general adult medical examination without abnormal findings: Secondary | ICD-10-CM | POA: Diagnosis not present

## 2023-01-21 LAB — URINALYSIS, ROUTINE W REFLEX MICROSCOPIC
Bilirubin Urine: NEGATIVE
Hgb urine dipstick: NEGATIVE
Ketones, ur: NEGATIVE
Nitrite: POSITIVE — AB
Specific Gravity, Urine: 1.02 (ref 1.000–1.030)
Total Protein, Urine: NEGATIVE
Urine Glucose: NEGATIVE
Urobilinogen, UA: 0.2 (ref 0.0–1.0)
pH: 5.5 (ref 5.0–8.0)

## 2023-01-21 LAB — SEDIMENTATION RATE: Sed Rate: 21 mm/h (ref 0–30)

## 2023-01-21 MED ORDER — VALSARTAN 320 MG PO TABS
320.0000 mg | ORAL_TABLET | Freq: Every day | ORAL | 4 refills | Status: DC
Start: 2023-01-21 — End: 2023-09-28

## 2023-01-21 MED ORDER — ATORVASTATIN CALCIUM 20 MG PO TABS
20.0000 mg | ORAL_TABLET | Freq: Every day | ORAL | 4 refills | Status: DC
Start: 2023-01-21 — End: 2024-01-23

## 2023-01-21 MED ORDER — METOPROLOL SUCCINATE ER 25 MG PO TB24
12.5000 mg | ORAL_TABLET | Freq: Every day | ORAL | 4 refills | Status: DC
Start: 2023-01-21 — End: 2024-01-23

## 2023-01-21 MED ORDER — MIRABEGRON ER 25 MG PO TB24: 25.0000 mg | ORAL_TABLET | Freq: Every day | ORAL | 3 refills | Status: DC

## 2023-01-21 NOTE — Assessment & Plan Note (Signed)
Update CBC, periph smear.

## 2023-01-21 NOTE — Assessment & Plan Note (Addendum)
Minimally present for years, worse this year, with worsening fatigue.  No other constitutional symptoms of fevers, night sweats, lymphadenopathy, or unexpected weight loss.  Update labs including periph smear, LDH, ESR, SPEP and flow cytometry.

## 2023-01-21 NOTE — Assessment & Plan Note (Signed)
Continue vit B12 MWF.

## 2023-01-21 NOTE — Assessment & Plan Note (Signed)

## 2023-01-21 NOTE — Patient Instructions (Addendum)
Flu shot today  Labs today  Urinalysis today. Avoid bladder irritants. Consider Myrbetriq.  Call to reschedule sleep study.  Insurance did not cover Prolia. Work on regular weight bearing exercises, vitamin D replacement and dietary calcium intake (goal 1200mg /day). We will check repeat bone density scan next year.  If interested, check with pharmacy about new 2 shot shingles series (shingrix). Ok to do RSV shot as well.  Return as needed or in 6 months for follow up visit.

## 2023-01-21 NOTE — Progress Notes (Signed)
Ph: [phone] Fax: [phone]   Patient ID: Dorthula Matas, female    DOB: 25-Nov-1940, 82 y.o.   MRN: [id number]  This visit was conducted in person.  BP 126/64   Pulse 70   Temp 98.4 F (36.9 C) (Temporal)   Ht 5' 1.25" (1.556 m)   Wt 147 lb 8 oz (66.9 kg)   SpO2 96%   BMI 27.64 kg/m    CC: AMW Subjective:   HPI: JAZLIN LOUCH is a 82 y.o. female presenting on 01/21/2023 for Medicare Wellness   Did not see health advisor this year.   Hearing Screening - Comments:: Wears B hearing aids. Wearing at today's OV.  Vision Screening - Comments:: Last eye exam, 03/2022.  Flowsheet Row Office Visit from 01/21/2023 in [facility] [place]  PHQ-2 Total Score 0          01/21/2023    9:35 AM 06/21/2022    9:30 AM 01/19/2022   11:06 AM 01/20/2021    2:03 PM 01/07/2020   11:29 AM  Fall Risk   Falls in the past year? 0 0 1 0 0  Number falls in past yr:   1    Injury with Fall?   0     Sees [facility] nephrology Dr Malen Gauze for CKD stage 3 due to hypertensive nephropathy. Seeing yearly, last seen 01/04/2023. On amlodipine valsartan and Toprol XL. Hydrochlorothiazide stopped due to orthostasis. Lasix stopped to limit volume depletion. Higher amlodipine caused ankle swelling. Most recently amlodipine dose dropped to 2.5mg  daily.   Erosive osteoarthritis B hands has seen rheumatology (Dr Dimple Casey) - tylenol 650mg  and topical voltaren not helpful. CBD salve helpful. ANA positive - rheum eval negative (anti-dsDNA, anti-Smith, anti-SSA).    Low back pain with neuropathy. Has completed 16 sessions of PT. MRI showed facet arthropathy and increased spinal stenosis L3/4. Saw PM&R Dr Ethelene Hal s/p ESI. Had 2 level decompression and fusion 08/2022 Yetta Barre). She is doing better since this surgery. Recommended eternal bone growth stimulator to reduce chance of pseudoarthrosis requiring further surgery. She uses this 30 min daily for 5 months.   Notes chronic insomnia with chronic  fatigue. She's started b complex for this. Endorses non-restorative sleep, loud snoring, occ PNDyspnea. Daytime somnolence. Has missed sleep study x2 - needs to reschedule.  Only drinks 1 cup of coffee in the morning.  Excessive fatigue even after going to grocery store.   Preventative:   COLONOSCOPY Date: 11/2010 TAx1, diverticulosis, rpt 5 yrs (Outlaw). COLONOSCOPY 03/2016 - WNL (Outlaw). No blood in stool, bowel changes.  Well woman with GYN Dr. Derrel Nip, last saw 2017. S/p hysterectomy and oophorectomy.  [facility] +fmhx.  DEXA - 12/2015 DEXA T -1.6 hip, -1.3 forearm - mild osteopenia.  DEXA 06/2021 - T -1.7 LFN with increased fracture risk (4.8% hip, 20.2% major fx). Prolia injection ok by nephrology - but insurance would not cover due to no OP dx.  Lung cancer screening - not eligible  Flu yearly  COVID vaccine - Pfizer 04/2019, 06/2019, booster 01/2020, bivalent 12/2020 Pneumovax 08/2005. Prevnar-13 11/2013  Tdap 04/2013  zostavax - 08/2013 shingrix - discussed, to check at pharmacy  Advanced directives: living will scanned 11/2016. Brings HCPOA with husband Peyton Najjar then daughter Marcelino Duster listed (06/2020).  Seat belt use discussed.  Sunscreen use discussed. No changing moles on skin. H/o melanoma, sees derm Dr Doreen Beam yearly.  Never smoker. Alcohol - none. Dentist yearly - due (no dental insurance)  Eye exam yearly in December Bowels - no constipation  Bladder - notes urinary urge incontinence despite sling procedure - incomplete emptying, no stress incontinence. Some accidents.   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 Interim medical history since our last visit  reviewed. Allergies and medications reviewed and updated. Outpatient Medications Prior to Visit  Medication Sig Dispense Refill   acetaminophen (TYLENOL) 650 MG CR tablet Take 1 tablet (650 mg total) by mouth 3 (three) times daily as needed for pain.     amLODipine (NORVASC) 2.5 MG tablet Take 2.5 mg by mouth daily.     aspirin EC 81 MG tablet Take 1 tablet (81 mg total) by mouth every Monday, Wednesday, and Friday.     b complex vitamins capsule Take 1 capsule by mouth daily.     Cholecalciferol (VITAMIN D3) 1000 units CAPS Take 1,000 Units by mouth daily.     cyanocobalamin 1000 MCG tablet Take 1 tablet (1,000 mcg total) by mouth every Monday, Wednesday, and Friday.     traMADol (ULTRAM) 50 MG tablet TAKE 1 TABLET BY MOUTH TWICE DAILY AS NEEDED FOR  MODERATE  PAIN 30 tablet 0   atorvastatin (LIPITOR) 20 MG tablet Take 1 tablet by mouth once daily 90 tablet 3   metoprolol succinate (TOPROL-XL) 25 MG 24 hr tablet Take 1/2 (one-half) tablet by mouth once daily 45 tablet 3   valsartan (DIOVAN) 320 MG tablet Take 1 tablet by mouth once daily 90 tablet 3   amLODipine (NORVASC) 5 MG tablet Take 1 tablet (5 mg total) by mouth daily.     HYDROcodone-acetaminophen (NORCO/VICODIN) 5-325 MG tablet Take 1 tablet by mouth every 4 (four) hours as needed for moderate pain ((score 4 to 6)). 30 tablet 0   methocarbamol (ROBAXIN) 500 MG tablet Take 1 tablet (500 mg total) by mouth every 8 (eight) hours as needed for muscle spasms. 60 tablet 1   No facility-administered medications prior to visit.     Per HPI unless specifically indicated in ROS section below Review of Systems  Objective:  BP 126/64   Pulse 70   Temp 98.4 F (36.9 C) (Temporal)   Ht 5' 1.25" (1.556 m)   Wt 147 lb 8 oz (66.9 kg)   SpO2 96%   BMI 27.64 kg/m   Wt Readings from Last 3 Encounters:  01/21/23 147 lb 8 oz (66.9 kg)  09/03/22 160 lb (72.6 kg)  08/30/22 159 lb 3.2 oz (72.2 kg)      Physical Exam Vitals and nursing note  reviewed.  Constitutional:      Appearance: Normal appearance. She is not ill-appearing.  HENT:     Head: Normocephalic and atraumatic.     Right Ear: Tympanic membrane, ear canal and external ear normal. There is no impacted cerumen.     Left Ear: Tympanic membrane, ear canal and external ear normal. There is no impacted cerumen.     Nose: Nose normal.     Mouth/Throat:     Mouth: Mucous membranes are moist.     Pharynx: Oropharynx is clear. No oropharyngeal exudate or posterior oropharyngeal erythema.  Eyes:     General:        Right  eye: No discharge.        Left eye: No discharge.     Extraocular Movements: Extraocular movements intact.     Conjunctiva/sclera: Conjunctivae normal.     Pupils: Pupils are equal, round, and reactive to light.  Neck:     Thyroid: No thyroid mass or thyromegaly.     Vascular: No carotid bruit.  Cardiovascular:     Rate and Rhythm: Normal rate and regular rhythm.     Pulses: Normal pulses.     Heart sounds: Normal heart sounds. No murmur heard. Pulmonary:     Effort: Pulmonary effort is normal. No respiratory distress.     Breath sounds: Normal breath sounds. No wheezing, rhonchi or rales.  Abdominal:     General: Bowel sounds are normal. There is no distension.     Palpations: Abdomen is soft. There is no mass.     Tenderness: There is no abdominal tenderness. There is no guarding or rebound.     Hernia: No hernia is present.  Musculoskeletal:     Cervical back: Normal range of motion and neck supple. No rigidity.     Right lower leg: No edema.     Left lower leg: No edema.  Lymphadenopathy:     Cervical: No cervical adenopathy.  Skin:    General: Skin is warm and dry.     Findings: No rash.  Neurological:     General: No focal deficit present.     Mental Status: She is alert. Mental status is at baseline.     Comments:  Recall 3/3 Calculation 5/5 DLROW  Psychiatric:        Mood and Affect: Mood normal.        Behavior: Behavior  normal.       Results for orders placed or performed in visit on 01/14/23  Vitamin B12  Result Value Ref Range   Vitamin B-12 821 211 - 911 pg/mL  CBC with Differential/Platelet  Result Value Ref Range   WBC 8.7 4.0 - 10.5 K/uL   RBC 3.86 (L) 3.87 - 5.11 Mil/uL   Hemoglobin 11.5 (L) 12.0 - 15.0 g/dL   HCT 16.1 (L) 09.6 - 04.5 %   MCV 91.0 78.0 - 100.0 fl   MCHC 32.7 30.0 - 36.0 g/dL   RDW 40.9 81.1 - 91.4 %   Platelets 348.0 150.0 - 400.0 K/uL   Neutrophils Relative % 35.7 (L) 43.0 - 77.0 %   Lymphocytes Relative 51.9 (H) 12.0 - 46.0 %   Monocytes Relative 8.5 3.0 - 12.0 %   Eosinophils Relative 2.9 0.0 - 5.0 %   Basophils Relative 1.0 0.0 - 3.0 %   Neutro Abs 3.1 1.4 - 7.7 K/uL   Lymphs Abs 4.5 (H) 0.7 - 4.0 K/uL   Monocytes Absolute 0.7 0.1 - 1.0 K/uL   Eosinophils Absolute 0.2 0.0 - 0.7 K/uL   Basophils Absolute 0.1 0.0 - 0.1 K/uL  Parathyroid hormone, intact (no Ca)  Result Value Ref Range   PTH 83 (H) 16 - 77 pg/mL  Microalbumin / creatinine urine ratio  Result Value Ref Range   Microalb, Ur 1.6 0.0 - 1.9 mg/dL   Creatinine,U 782.9 mg/dL   Microalb Creat Ratio 1.5 0.0 - 30.0 mg/g  VITAMIN D 25 Hydroxy (Vit-D Deficiency, Fractures)  Result Value Ref Range   VITD 38.69 30.00 - 100.00 ng/mL  Phosphorus  Result Value Ref Range   Phosphorus 4.2 2.3 - 4.6 mg/dL  Comprehensive metabolic panel  Result Value  Ref Range   Sodium 139 135 - 145 mEq/L   Potassium 4.1 3.5 - 5.1 mEq/L   Chloride 106 96 - 112 mEq/L   CO2 21 19 - 32 mEq/L   Glucose, Bld 101 (H) 70 - 99 mg/dL   BUN 25 (H) 6 - 23 mg/dL   Creatinine, Ser 4.09 (H) 0.40 - 1.20 mg/dL   Total Bilirubin 0.4 0.2 - 1.2 mg/dL   Alkaline Phosphatase 93 39 - 117 U/L   AST 15 0 - 37 U/L   ALT 13 0 - 35 U/L   Total Protein 6.9 6.0 - 8.3 g/dL   Albumin 4.0 3.5 - 5.2 g/dL   GFR 81.19 (L) >14.78 mL/min   Calcium 9.3 8.4 - 10.5 mg/dL  Lipid panel  Result Value Ref Range   Cholesterol 115 0 - 200 mg/dL   Triglycerides  295.6 (H) 0.0 - 149.0 mg/dL   HDL 21.30 (L) >86.57 mg/dL   VLDL 84.6 0.0 - 96.2 mg/dL   LDL Cholesterol 47 0 - 99 mg/dL   Total CHOL/HDL Ratio 3    NonHDL 78.56    Lab Results  Component Value Date   TSH 1.44 01/19/2022   Assessment & Plan:   Problem List Items Addressed This Visit     Medicare annual wellness visit, subsequent - Primary (Chronic)    I have personally reviewed the Medicare Annual Wellness questionnaire and have noted 1. The patient's medical and social history 2. Their use of alcohol, tobacco or illicit drugs 3. Their current medications and supplements 4. The patient's functional ability including ADL's, fall risks, home safety risks and hearing or visual impairment. Cognitive function has been assessed and addressed as indicated.  5. Diet and physical activity 6. Evidence for depression or mood disorders The patients weight, height, BMI have been recorded in the chart. I have made referrals, counseling and provided education to the patient based on review of the above and I have provided the pt with a written personalized care plan for preventive services. Provider list updated.. See scanned questionairre as needed for further documentation. Reviewed preventative protocols and updated unless pt declined.       Advanced care planning/counseling discussion (Chronic)    Advanced directives: living will scanned 11/2016. Brings HCPOA with husband Peyton Najjar then daughter Marcelino Duster listed (06/2020).       DDD (degenerative disc disease), lumbar   Osteopenia with high risk of fracture    Insurance did not cover prolia injection due to no OP diagnosis. Continue regular weight bearing exercise, calcium in diet, vit D replacement. Recheck DEXA next year.       HTN (hypertension)    Chronic, stable on current regimen including lower amlodipine dose - continue.       Relevant Medications   amLODipine (NORVASC) 2.5 MG tablet   atorvastatin (LIPITOR) 20 MG tablet    metoprolol succinate (TOPROL-XL) 25 MG 24 hr tablet   valsartan (DIOVAN) 320 MG tablet   HLD (hyperlipidemia)    Chronic, stable period on atorvastatin - continue. The ASCVD Risk score (Arnett DK, et al., 2019) failed to calculate for the following reasons:   The 2019 ASCVD risk score is only valid for ages 34 to 67       Relevant Medications   amLODipine (NORVASC) 2.5 MG tablet   atorvastatin (LIPITOR) 20 MG tablet   metoprolol succinate (TOPROL-XL) 25 MG 24 hr tablet   valsartan (DIOVAN) 320 MG tablet   IBS (irritable bowel syndrome)   CKD (  chronic kidney disease) stage 3, GFR 30-59 ml/min (HCC)    Appreciate nephrology care, seeing yearly.  Labs overall stable.       Relevant Orders   Sedimentation rate   Serum protein electrophoresis with reflex   Lymphocytosis    Minimally present for years, worse this year, with worsening fatigue.  No other constitutional symptoms of fevers, night sweats, lymphadenopathy, or unexpected weight loss.  Update labs including periph smear, LDH, ESR, SPEP and flow cytometry.       Relevant Orders   Pathologist smear review   LEUKEMIA/LYMPHOMA EVALUATION PANEL   Lactate dehydrogenase   Sedimentation rate   Serum protein electrophoresis with reflex   CBC with Differential/Platelet   Erosive osteoarthritis of both hands   Chronic fatigue    Ongoing for the past year.  She recently started B complex multivitamin.  She will call to reschedule sleep study.  Update labs including ESR.       Relevant Orders   Sedimentation rate   Serum protein electrophoresis with reflex   Low serum vitamin B12    Continue vit B12 MWF.       S/P lumbar fusion   Anemia in chronic kidney disease (CKD)    Update CBC, periph smear.       Relevant Orders   Pathologist smear review   CBC with Differential/Platelet   Urge incontinence    No stress incontinence symptoms.  Check UA today.  Start myrbetriq 25mg  nightly.  Avoiding antimuscarinics  due to age and comorbidities, side effects.       Relevant Medications   mirabegron ER (MYRBETRIQ) 25 MG TB24 tablet   Other Relevant Orders   Urinalysis, Routine w reflex microscopic   Other Visit Diagnoses     Encounter for immunization       Relevant Orders   Flu Vaccine Trivalent High Dose (Fluad) (Completed)        Meds ordered this encounter  Medications   atorvastatin (LIPITOR) 20 MG tablet    Sig: Take 1 tablet (20 mg total) by mouth daily.    Dispense:  90 tablet    Refill:  4   metoprolol succinate (TOPROL-XL) 25 MG 24 hr tablet    Sig: Take 0.5 tablets (12.5 mg total) by mouth daily.    Dispense:  45 tablet    Refill:  4   valsartan (DIOVAN) 320 MG tablet    Sig: Take 1 tablet (320 mg total) by mouth daily.    Dispense:  90 tablet    Refill:  4   mirabegron ER (MYRBETRIQ) 25 MG TB24 tablet    Sig: Take 1 tablet (25 mg total) by mouth daily.    Dispense:  30 tablet    Refill:  3    Orders Placed This Encounter  Procedures   Flu Vaccine Trivalent High Dose (Fluad)   Pathologist smear review   LEUKEMIA/LYMPHOMA EVALUATION PANEL   Lactate dehydrogenase   Sedimentation rate   Serum protein electrophoresis with reflex   Urinalysis, Routine w reflex microscopic   CBC with Differential/Platelet    Patient Instructions  Flu shot today  Labs today  Urinalysis today. Avoid bladder irritants. Consider Myrbetriq.  Call to reschedule sleep study.  Insurance did not cover Prolia. Work on regular weight bearing exercises, vitamin D replacement and dietary calcium intake (goal 1200mg /day). We will check repeat bone density scan next year.  If interested, check with pharmacy about new 2 shot shingles series (shingrix). Ok to do RSV  shot as well.  Return as needed or in 6 months for follow up visit.   Follow up plan: Return in about 6 months (around 07/21/2023) for follow up visit.  Eustaquio Boyden, MD

## 2023-01-21 NOTE — Assessment & Plan Note (Signed)
No stress incontinence symptoms.  Check UA today.  Start myrbetriq 25mg  nightly.  Avoiding antimuscarinics due to age and comorbidities, side effects.

## 2023-01-21 NOTE — Assessment & Plan Note (Signed)
Appreciate nephrology care, seeing yearly.  Labs overall stable.

## 2023-01-21 NOTE — Assessment & Plan Note (Signed)
Chronic, stable on current regimen including lower amlodipine dose - continue.

## 2023-01-21 NOTE — Assessment & Plan Note (Signed)
Chronic, stable period on atorvastatin - continue. The ASCVD Risk score (Arnett DK, et al., 2019) failed to calculate for the following reasons:   The 2019 ASCVD risk score is only valid for ages 54 to 95

## 2023-01-21 NOTE — Assessment & Plan Note (Signed)
Advanced directives: living will scanned 11/2016. Brings HCPOA with husband Carrie Lucas then daughter Carrie Lucas listed (06/2020).

## 2023-01-21 NOTE — Assessment & Plan Note (Signed)
Ongoing for the past year.  She recently started B complex multivitamin.  She will call to reschedule sleep study.  Update labs including ESR.

## 2023-01-21 NOTE — Assessment & Plan Note (Signed)
Insurance did not cover prolia injection due to no OP diagnosis. Continue regular weight bearing exercise, calcium in diet, vit D replacement. Recheck DEXA next year.

## 2023-01-25 LAB — CBC WITH DIFFERENTIAL/PLATELET
Absolute Monocytes: 521 {cells}/uL (ref 200–950)
Basophils Absolute: 79 {cells}/uL (ref 0–200)
Basophils Relative: 1.2 %
Eosinophils Absolute: 198 {cells}/uL (ref 15–500)
Eosinophils Relative: 3 %
HCT: 36.9 % (ref 35.0–45.0)
Hemoglobin: 12 g/dL (ref 11.7–15.5)
Lymphs Abs: 2732 {cells}/uL (ref 850–3900)
MCH: 30 pg (ref 27.0–33.0)
MCHC: 32.5 g/dL (ref 32.0–36.0)
MCV: 92.3 fL (ref 80.0–100.0)
MPV: 10.3 fL (ref 7.5–12.5)
Monocytes Relative: 7.9 %
Neutro Abs: 3069 {cells}/uL (ref 1500–7800)
Neutrophils Relative %: 46.5 %
Platelets: 344 10*3/uL (ref 140–400)
RBC: 4 10*6/uL (ref 3.80–5.10)
RDW: 12.8 % (ref 11.0–15.0)
Total Lymphocyte: 41.4 %
WBC: 6.6 10*3/uL (ref 3.8–10.8)

## 2023-01-25 LAB — PATHOLOGIST SMEAR REVIEW

## 2023-01-25 LAB — PROTEIN ELECTROPHORESIS, SERUM, WITH REFLEX
Albumin ELP: 3.7 g/dL — ABNORMAL LOW (ref 3.8–4.8)
Alpha 1: 0.3 g/dL (ref 0.2–0.3)
Alpha 2: 1 g/dL — ABNORMAL HIGH (ref 0.5–0.9)
Beta 2: 0.4 g/dL (ref 0.2–0.5)
Beta Globulin: 0.4 g/dL (ref 0.4–0.6)
Gamma Globulin: 0.8 g/dL (ref 0.8–1.7)
Total Protein: 6.7 g/dL (ref 6.1–8.1)

## 2023-01-25 LAB — LEUKEMIA/LYMPHOMA EVALUATION PANEL

## 2023-01-25 LAB — LACTATE DEHYDROGENASE: LDH: 137 U/L (ref 120–250)

## 2023-01-26 ENCOUNTER — Encounter: Payer: Self-pay | Admitting: Family Medicine

## 2023-02-16 ENCOUNTER — Encounter: Payer: Self-pay | Admitting: Family Medicine

## 2023-02-16 ENCOUNTER — Ambulatory Visit: Payer: Medicare Other | Admitting: Family Medicine

## 2023-02-16 VITALS — BP 160/62 | HR 70 | Temp 98.7°F | Ht 61.25 in | Wt 155.1 lb

## 2023-02-16 DIAGNOSIS — M109 Gout, unspecified: Secondary | ICD-10-CM | POA: Insufficient documentation

## 2023-02-16 DIAGNOSIS — N3000 Acute cystitis without hematuria: Secondary | ICD-10-CM | POA: Diagnosis not present

## 2023-02-16 DIAGNOSIS — R300 Dysuria: Secondary | ICD-10-CM

## 2023-02-16 DIAGNOSIS — I10 Essential (primary) hypertension: Secondary | ICD-10-CM

## 2023-02-16 DIAGNOSIS — N1832 Chronic kidney disease, stage 3b: Secondary | ICD-10-CM

## 2023-02-16 LAB — POC URINALSYSI DIPSTICK (AUTOMATED)
Bilirubin, UA: NEGATIVE
Blood, UA: NEGATIVE
Glucose, UA: NEGATIVE
Ketones, UA: NEGATIVE
Leukocytes, UA: NEGATIVE
Nitrite, UA: POSITIVE
Protein, UA: NEGATIVE
Spec Grav, UA: 1.02 (ref 1.010–1.025)
Urobilinogen, UA: 0.2 U/dL
pH, UA: 5.5 (ref 5.0–8.0)

## 2023-02-16 MED ORDER — PREDNISONE 20 MG PO TABS: ORAL_TABLET | ORAL | 0 refills | Status: DC

## 2023-02-16 MED ORDER — CEPHALEXIN 500 MG PO CAPS: 500.0000 mg | ORAL_CAPSULE | Freq: Two times a day (BID) | ORAL | 0 refills | Status: AC

## 2023-02-16 NOTE — Patient Instructions (Signed)
VISIT SUMMARY: Carrie Lucas, during today's visit, we addressed your recent urinary symptoms, toe pain, shoulder pain, hypertension, and chronic kidney disease. We have started treatments and provided recommendations to help manage these issues.  YOUR PLAN: -URINARY TRACT INFECTION: A urinary tract infection (UTI) is an infection in any part of your urinary system. We have started you on Keflex 500mg  twice daily for 7 days and sent a urine culture to confirm the infection. Please continue to drink plenty of fluids and avoid bladder irritants.  -GOUT (PODAGRA): Gout is a form of arthritis characterized by sudden, severe attacks of pain, swelling, and redness in the joints. We have started you on a Prednisone taper over 8 days to reduce inflammation. You have also been given a handout on gout prevention. If you experience recurrent gout flares, we may consider daily medication for prevention.  -SHOULDER PAIN: Your shoulder pain may be due to rotator cuff tendonitis or arthritis. We will observe how your shoulder responds to the Prednisone taper. If the pain persists, return for further evaluation with myself or sports medicine in the office.  -HYPERTENSION: Hypertension is high blood pressure. Your blood pressure was elevated today, possibly due to pain. Please recheck your blood pressure at home. If it remains high, we may consider increasing your Metoprolol dosage.  -CHRONIC KIDNEY DISEASE (STAGE 3): Chronic kidney disease is a condition characterized by a gradual loss of kidney function over time. You are currently under the care of Dr. Malen Gauze for this condition, and we will continue with the current management plan.  INSTRUCTIONS: Please follow up with a urine culture as discussed. Recheck your blood pressure at home and monitor for any changes. If your shoulder pain persists, we may need to refer you to a specialist. Continue to follow the management plan for your chronic kidney disease with Dr.  Malen Gauze.

## 2023-02-16 NOTE — Progress Notes (Signed)
Ph: [phone] Fax: [phone]   Patient ID: Carrie Lucas, female    DOB: 1941/03/14, 82 y.o.   MRN: [id number]  This visit was conducted in person.  BP (!) 160/62   Pulse 70   Temp 98.7 F (37.1 C) (Oral)   Ht 5' 1.25" (1.556 m)   Wt 155 lb 2 oz (70.4 kg)   SpO2 95%   BMI 29.07 kg/m   BP Readings from Last 3 Encounters:  02/16/23 (!) 160/62  01/21/23 126/64  09/04/22 (!) 119/59  BP at home this morning 136/60s.   Chief Complaint  Patient presents with   Dysuria    C/o pain when urinating and urinary frequency. Sxs started 02/15/23.   Toe Pain    C/o L great toe pain and foot swelling. Sxs     Subjective:   Discussed the use of AI scribe software for clinical note transcription with the patient, who gave verbal consent to proceed.  History of Present Illness   Carrie Lucas, an 82 year old with a history of chronic kidney disease stage three and hypertension, presents with urinary symptoms and toe pain. She is currently on amlodipine 2.5mg  daily, metoprolol XL 12.5mg  daily, and valsartan 320mg  daily for hypertension. She is also under the care of a nephrologist for her chronic kidney disease.  She reports a recent onset of urinary symptoms, including increased frequency and discomfort during urination, which started yesterday. She denies hematuria, fever, chills, back pain, flank pain, lower abdominal pain, and nausea or vomiting. She has not tried any over-the-counter remedies for her symptoms but has been trying to increase her water intake and limit bladder irritants in her diet.  In addition to her urinary symptoms, she also reports pain and swelling in the first big joint of her left great toe, which started three days ago. She denies any known trauma, falls, injury, or bug bites. The pain is so severe that she has difficulty wearing shoes and has to walk on the side of her foot. She has been taking Tylenol for the pain. She denies any recent increase in consumption  of shrimp, shellfish, red meats, organ meats, or beer.  She also mentions ongoing pain in her left shoulder, particularly at night, which has been bothering her for a couple of months. She denies any falls or injury to the shoulder. She has been using icy hot for relief.          Relevant past medical, surgical, family and social history reviewed and updated as indicated. Interim medical history since our last visit reviewed. Allergies and medications reviewed and updated. Outpatient Medications Prior to Visit  Medication Sig Dispense Refill   acetaminophen (TYLENOL) 650 MG CR tablet Take 1 tablet (650 mg total) by mouth 3 (three) times daily as needed for pain.     amLODipine (NORVASC) 2.5 MG tablet Take 2.5 mg by mouth daily.     aspirin EC 81 MG tablet Take 1 tablet (81 mg total) by mouth every Monday, Wednesday, and Friday.     atorvastatin (LIPITOR) 20 MG tablet Take 1 tablet (20 mg total) by mouth daily. 90 tablet 4   b complex vitamins capsule Take 1 capsule by mouth daily.     Cholecalciferol (VITAMIN D3) 1000 units CAPS Take 1,000 Units by mouth daily.     cyanocobalamin 1000 MCG tablet Take 1 tablet (1,000 mcg total) by mouth every Monday, Wednesday, and Friday.     metoprolol succinate (TOPROL-XL) 25 MG 24 hr tablet  Take 0.5 tablets (12.5 mg total) by mouth daily. 45 tablet 4   mirabegron ER (MYRBETRIQ) 25 MG TB24 tablet Take 1 tablet (25 mg total) by mouth daily. 30 tablet 3   traMADol (ULTRAM) 50 MG tablet TAKE 1 TABLET BY MOUTH TWICE DAILY AS NEEDED FOR  MODERATE  PAIN 30 tablet 0   valsartan (DIOVAN) 320 MG tablet Take 1 tablet (320 mg total) by mouth daily. 90 tablet 4   No facility-administered medications prior to visit.     Per HPI unless specifically indicated in ROS section below Review of Systems  Objective:  BP (!) 160/62   Pulse 70   Temp 98.7 F (37.1 C) (Oral)   Ht 5' 1.25" (1.556 m)   Wt 155 lb 2 oz (70.4 kg)   SpO2 95%   BMI 29.07 kg/m   Wt Readings  from Last 3 Encounters:  02/16/23 155 lb 2 oz (70.4 kg)  01/21/23 147 lb 8 oz (66.9 kg)  09/03/22 160 lb (72.6 kg)      Physical Exam   HEENT: Moist mucous membranes. CHEST: Lungs clear to auscultation bilaterally without crackles or wheezing. CARDIOVASCULAR: Normal S1, S2, no murmurs, rubs or gallops. ABDOMEN: Normal active bowel sounds without any tenderness to palpation or hepatosplenomegaly. EXTREMITIES: No pedal edema. Two plus dorsalis pedis pulses bilaterally.  MSK: Foot exam: Significant tender swelling to left first metatarsophalangeal joint without significant pain on axial loading, but pain with dorsiflexion and plantar flexion of left great first toe. No significant erythema or rash. R shoulder pain was not assessed.             Results for orders placed or performed in visit on 02/16/23  POCT Urinalysis Dipstick (Automated)  Result Value Ref Range   Color, UA yellow    Clarity, UA clear    Glucose, UA Negative Negative   Bilirubin, UA negative    Ketones, UA negative    Spec Grav, UA 1.020 1.010 - 1.025   Blood, UA negative    pH, UA 5.5 5.0 - 8.0   Protein, UA Negative Negative   Urobilinogen, UA 0.2 0.2 or 1.0 E.U./dL   Nitrite, UA positive    Leukocytes, UA Negative Negative   Lab Results  Component Value Date   NA 139 01/14/2023   CL 106 01/14/2023   K 4.1 01/14/2023   CO2 21 01/14/2023   BUN 25 (H) 01/14/2023   CREATININE 1.33 (H) 01/14/2023   GFR 37.26 (L) 01/14/2023   CALCIUM 9.3 01/14/2023   PHOS 4.2 01/14/2023   ALBUMIN 4.0 01/14/2023   GLUCOSE 101 (H) 01/14/2023   Lab Results  Component Value Date   WBC 6.6 01/21/2023   HGB 12.0 01/21/2023   HCT 36.9 01/21/2023   MCV 92.3 01/21/2023   PLT 344 01/21/2023    Assessment & Plan:      Urinary Tract Infection New onset dysuria, frequency, and urgency without hematuria, fever, or flank pain. Urinalysis positive for nitrites, leukocytes, and bacteria. -Start Keflex 500mg  BID for 7  days. -Send urine culture. -Encourage good fluid intake and avoidance of bladder irritants.  Gout (Podagra) Acute onset of pain, swelling, and tenderness of the left first metatarsophalangeal joint without known trauma or insect bite. Recent ingestion of shrimp. -Start Prednisone taper over 8 days. -Provide gout prevention handout. -If recurrent gout flares, consider daily gout prevention medication.  R shoulder Pain Rright shoulder pain, worse at night. No known trauma. Possible rotator cuff tendonitis or arthritis. -Observe response  to Prednisone taper. -If ongoing, return for further evaluation by me or sports medicine  Hypertension Elevated blood pressure at today's visit, possibly due to pain. Patient is on Amlodipine 2.5mg  daily, Metoprolol XL 12.5mg  daily, and Valsartan 320mg  daily. -Recheck blood pressure at home, update if persistently >135/85. -Consider increasing Metoprolol if blood pressure remains elevated.  Chronic Kidney Disease (Stage 3) Managed by Nephrologist Dr. Malen Gauze. Last GFR was 37.3. -Continue current management.      Problem List Items Addressed This Visit     HTN (hypertension)   CKD (chronic kidney disease) stage 3, GFR 30-59 ml/min (HCC)   UTI (urinary tract infection)   Relevant Medications   cephALEXin (KEFLEX) 500 MG capsule   Other Relevant Orders   Urine Culture   Podagra - Primary   Relevant Medications   predniSONE (DELTASONE) 20 MG tablet   Other Visit Diagnoses     Dysuria       Relevant Orders   POCT Urinalysis Dipstick (Automated) (Completed)   Urine Culture        Meds ordered this encounter  Medications   cephALEXin (KEFLEX) 500 MG capsule    Sig: Take 1 capsule (500 mg total) by mouth 2 (two) times daily for 7 days.    Dispense:  14 capsule    Refill:  0   predniSONE (DELTASONE) 20 MG tablet    Sig: Take two tablets daily for 4 days followed by one tablet daily for 4 days    Dispense:  12 tablet    Refill:  0     Orders Placed This Encounter  Procedures   Urine Culture   POCT Urinalysis Dipstick (Automated)    Patient Instructions  VISIT SUMMARY: Mrs. Gaumond, during today's visit, we addressed your recent urinary symptoms, toe pain, shoulder pain, hypertension, and chronic kidney disease. We have started treatments and provided recommendations to help manage these issues.  YOUR PLAN: -URINARY TRACT INFECTION: A urinary tract infection (UTI) is an infection in any part of your urinary system. We have started you on Keflex 500mg  twice daily for 7 days and sent a urine culture to confirm the infection. Please continue to drink plenty of fluids and avoid bladder irritants.  -GOUT (PODAGRA): Gout is a form of arthritis characterized by sudden, severe attacks of pain, swelling, and redness in the joints. We have started you on a Prednisone taper over 8 days to reduce inflammation. You have also been given a handout on gout prevention. If you experience recurrent gout flares, we may consider daily medication for prevention.  -SHOULDER PAIN: Your shoulder pain may be due to rotator cuff tendonitis or arthritis. We will observe how your shoulder responds to the Prednisone taper. If the pain persists, return for further evaluation with myself or sports medicine in the office.  -HYPERTENSION: Hypertension is high blood pressure. Your blood pressure was elevated today, possibly due to pain. Please recheck your blood pressure at home. If it remains high, we may consider increasing your Metoprolol dosage.  -CHRONIC KIDNEY DISEASE (STAGE 3): Chronic kidney disease is a condition characterized by a gradual loss of kidney function over time. You are currently under the care of Dr. Malen Gauze for this condition, and we will continue with the current management plan.  INSTRUCTIONS: Please follow up with a urine culture as discussed. Recheck your blood pressure at home and monitor for any changes. If your shoulder pain  persists, we may need to refer you to a specialist. Continue to follow the management  plan for your chronic kidney disease with Dr. Malen Gauze.  Follow up plan: Return if symptoms worsen or fail to improve.  Eustaquio Boyden, MD

## 2023-02-18 LAB — URINE CULTURE
MICRO NUMBER:: 15632941
SPECIMEN QUALITY:: ADEQUATE

## 2023-03-02 ENCOUNTER — Telehealth: Payer: Self-pay | Admitting: Family Medicine

## 2023-03-02 MED ORDER — CEPHALEXIN 500 MG PO CAPS: 500.0000 mg | ORAL_CAPSULE | Freq: Two times a day (BID) | ORAL | 0 refills | Status: DC

## 2023-03-02 NOTE — Telephone Encounter (Signed)
Pt called stating her uti hasn't left yet. Pt asked if another rx of predniSONE (DELTASONE) 20 MG tablet, could be prescribed to her? Call back # [phone].

## 2023-03-02 NOTE — Telephone Encounter (Addendum)
Plz notify ERx abx second course to pharmacy Let us know if ongoing symptoms despite treatment.

## 2023-03-02 NOTE — Telephone Encounter (Signed)
Spoke with pt asking about ongoing UTI sxs. States she finished abx last wk and sxs were improving but not resolved. Still has urinary frequency and burning with urination. Pt requests another round of abx [not prednisone] sent to [facility] [address]

## 2023-03-02 NOTE — Addendum Note (Signed)
Addended by: Eustaquio Boyden on: 03/02/2023 05:00 PM   Modules accepted: Orders

## 2023-03-03 NOTE — Telephone Encounter (Signed)
Spoke with pt relaying Dr Timoteo Expose message. Pt expresses her thanks and verbalizes understanding.

## 2023-03-08 ENCOUNTER — Other Ambulatory Visit: Payer: Self-pay | Admitting: Family Medicine

## 2023-03-08 DIAGNOSIS — I10 Essential (primary) hypertension: Secondary | ICD-10-CM

## 2023-03-10 ENCOUNTER — Telehealth: Payer: Self-pay | Admitting: Family Medicine

## 2023-03-10 NOTE — Telephone Encounter (Signed)
Pt called stating she is having issues with her gout again in left foot. Pt states she barely slept last night due to the foot pain. Pt requested another rx be sent in for predniSONE (DELTASONE) 20 MG tablet? Call back # [phone]

## 2023-03-11 ENCOUNTER — Ambulatory Visit (INDEPENDENT_AMBULATORY_CARE_PROVIDER_SITE_OTHER): Payer: Medicare Other | Admitting: Family Medicine

## 2023-03-11 ENCOUNTER — Encounter: Payer: Self-pay | Admitting: Family Medicine

## 2023-03-11 VITALS — BP 150/70 | HR 65 | Temp 99.1°F | Ht 61.25 in | Wt 154.2 lb

## 2023-03-11 DIAGNOSIS — I10 Essential (primary) hypertension: Secondary | ICD-10-CM

## 2023-03-11 DIAGNOSIS — M109 Gout, unspecified: Secondary | ICD-10-CM

## 2023-03-11 DIAGNOSIS — N1832 Chronic kidney disease, stage 3b: Secondary | ICD-10-CM

## 2023-03-11 MED ORDER — PREDNISONE 20 MG PO TABS: ORAL_TABLET | ORAL | 0 refills | Status: DC

## 2023-03-11 MED ORDER — ASCORBIC ACID 500 MG PO TABS: 500.0000 mg | ORAL_TABLET | Freq: Every day | ORAL | Status: AC

## 2023-03-11 MED ORDER — ALLOPURINOL 100 MG PO TABS: 100.0000 mg | ORAL_TABLET | Freq: Every day | ORAL | 6 refills | Status: DC

## 2023-03-11 NOTE — Telephone Encounter (Signed)
Spoke to pt, pt states she's available to come @ 4 today.

## 2023-03-11 NOTE — Telephone Encounter (Signed)
Appointment has been scheduled.  No further action needed at this time.

## 2023-03-11 NOTE — Patient Instructions (Signed)
VISIT SUMMARY: You were seen today for a recurrence of left foot pain, specifically in your big toe. This pain has been severe and has not responded to your usual treatments. We discussed your history of chronic kidney disease and hypertension, and how these conditions may be contributing to your current symptoms.  YOUR PLAN: -GOUT: Gout is a type of arthritis caused by the buildup of uric acid crystals in the joints, leading to severe pain and swelling. You will start a prednisone taper again, taking 2 pills for 4 days, then 1 pill for 4 days. Additionally, you should start taking Vitamin C 500mg  daily and consider tart cherry juice for extra Vitamin C. We may start you on allopurinol once your symptoms have resolved to prevent future flares. A refill for prednisone has been provided in case of another flare when starting allopurinol.   -HYPERTENSION: Hypertension, or high blood pressure, is being managed with your current medications: amlodipine, valsartan, and metoprolol succinate. Continue with your current regimen.  -CHRONIC KIDNEY DISEASE: Chronic kidney disease affects how well your kidneys function and can contribute to conditions like gout. Continue with your current management plan.  INSTRUCTIONS: Please follow up in March 2025 for your routine physical.

## 2023-03-11 NOTE — Progress Notes (Signed)
Ph: [phone] Fax: [phone]   Patient ID: Carrie Lucas, female    DOB: [birth year], 82 y.o.   MRN: [id number]  This visit was conducted in person.  BP (!) 150/70   Pulse 65   Temp 99.1 F (37.3 C) (Oral)   Ht 5' 1.25" (1.556 m)   Wt 154 lb 4 oz (70 kg)   SpO2 95%   BMI 28.91 kg/m   BP Readings from Last 3 Encounters:  03/11/23 (!) 150/70  02/16/23 (!) 160/62  01/21/23 126/64  Home readings - 120-130s systolic  Chief Complaint  Patient presents with   Medical Management of Chronic Issues    Here for gout flare in L great toe. Sxs started 03/09/23.     Subjective:   Discussed the use of AI scribe software for clinical note transcription with the patient, who gave verbal consent to proceed.  History of Present Illness   Carrie Lucas has a history of chronic kidney disease, hypertension, and a recent episode of left foot podagra and UTI, presents with a recurrence of left foot pain. The pain began two days ago with swelling and mild discomfort, but has since progressed to severe throbbing pain, particularly at night. The pain is localized to the left big toe and is so severe that it prevents sleep and is sensitive to even light touch. The patient has tried various arthritis ointments, ice, and tramadol, but none have provided relief. The previous episode of podagra was successfully treated with a prednisone taper and the patient has been adhering to dietary recommendations to prevent recurrence. The patient denies any recent dietary triggers or injury to the foot.         Relevant past medical, surgical, family and social history reviewed and updated as indicated. Interim medical history since our last visit reviewed. Allergies and medications reviewed and updated. Outpatient Medications Prior to Visit  Medication Sig Dispense Refill   acetaminophen (TYLENOL) 650 MG CR tablet Take 1 tablet (650 mg total) by mouth 3 (three) times daily as needed for pain.     amLODipine  (NORVASC) 2.5 MG tablet Take 2.5 mg by mouth daily.     aspirin EC 81 MG tablet Take 1 tablet (81 mg total) by mouth every Monday, Wednesday, and Friday.     atorvastatin (LIPITOR) 20 MG tablet Take 1 tablet (20 mg total) by mouth daily. 90 tablet 4   b complex vitamins capsule Take 1 capsule by mouth daily.     Cholecalciferol (VITAMIN D3) 1000 units CAPS Take 1,000 Units by mouth daily.     cyanocobalamin 1000 MCG tablet Take 1 tablet (1,000 mcg total) by mouth every Monday, Wednesday, and Friday.     metoprolol succinate (TOPROL-XL) 25 MG 24 hr tablet Take 0.5 tablets (12.5 mg total) by mouth daily. 45 tablet 4   traMADol (ULTRAM) 50 MG tablet TAKE 1 TABLET BY MOUTH TWICE DAILY AS NEEDED FOR  MODERATE  PAIN 30 tablet 0   valsartan (DIOVAN) 320 MG tablet Take 1 tablet (320 mg total) by mouth daily. 90 tablet 4   cephALEXin (KEFLEX) 500 MG capsule Take 1 capsule (500 mg total) by mouth 2 (two) times daily. 14 capsule 0   mirabegron ER (MYRBETRIQ) 25 MG TB24 tablet Take 1 tablet (25 mg total) by mouth daily. 30 tablet 3   predniSONE (DELTASONE) 20 MG tablet Take two tablets daily for 4 days followed by one tablet daily for 4 days 12 tablet 0   No  facility-administered medications prior to visit.     Per HPI unless specifically indicated in ROS section below Review of Systems  Objective:  BP (!) 150/70   Pulse 65   Temp 99.1 F (37.3 C) (Oral)   Ht 5' 1.25" (1.556 m)   Wt 154 lb 4 oz (70 kg)   SpO2 95%   BMI 28.91 kg/m   Wt Readings from Last 3 Encounters:  03/11/23 154 lb 4 oz (70 kg)  02/16/23 155 lb 2 oz (70.4 kg)  01/21/23 147 lb 8 oz (66.9 kg)      Physical Exam   MUSCULOSKELETAL: Left great toe metatarsophalangeal joint inflamed, swollen, slightly red on the medial side, very tender to touch. No pain with axial loading. Pain elicited with palpation on the dorsal aspect, both with dorsiflexion and plantarflexion. Right foot without abnormalities.  2+ DP bilaterally          No results found for: "LABURIC"  Assessment & Plan:      Gout Recurrent flare in the left big toe despite no identifiable dietary triggers. Previous episode resolved with prednisone taper. Discussed the role of chronic kidney disease in gout pathogenesis and the potential need for urate-lowering therapy. -Start prednisone taper again:2 pills for 4 days, then 1 pill for 4 days. -Start Vitamin C 500mg  daily. -Consider tart cherry juice for additional Vitamin C intake. -Consider starting allopurinol once gout symptoms have resolved. -Provide refill for prednisone to have on hand in case of flare upon starting allopurinol.  Hypertension Chronic, BP above goal in setting of acute gout pain and possible component of white coat hypertension. She states home readings are largely well controlled. This is normally managed with amlodipine, valsartan, and metoprolol succinate. -Continue current regimen. -Continue checking BP at home, let me know if consistently above goal.   Chronic Kidney Disease Discussed the role in gout pathogenesis. -Continue current management.  Follow-up in March 2025 for routine physical.       Problem List Items Addressed This Visit     HTN (hypertension)   CKD (chronic kidney disease) stage 3, GFR 30-59 ml/min (HCC)   Podagra - Primary   Relevant Medications   predniSONE (DELTASONE) 20 MG tablet   allopurinol (ZYLOPRIM) 100 MG tablet     Meds ordered this encounter  Medications   predniSONE (DELTASONE) 20 MG tablet    Sig: Take two tablets daily for 4 days followed by one tablet daily for 4 days    Dispense:  12 tablet    Refill:  0   allopurinol (ZYLOPRIM) 100 MG tablet    Sig: Take 1 tablet (100 mg total) by mouth daily.    Dispense:  30 tablet    Refill:  6   ascorbic acid (VITAMIN C) 500 MG tablet    Sig: Take 1 tablet (500 mg total) by mouth daily.    No orders of the defined types were placed in this encounter.   Patient Instructions  VISIT  SUMMARY: You were seen today for a recurrence of left foot pain, specifically in your big toe. This pain has been severe and has not responded to your usual treatments. We discussed your history of chronic kidney disease and hypertension, and how these conditions may be contributing to your current symptoms.  YOUR PLAN: -GOUT: Gout is a type of arthritis caused by the buildup of uric acid crystals in the joints, leading to severe pain and swelling. You will start a prednisone taper again, taking 2 pills for  4 days, then 1 pill for 4 days. Additionally, you should start taking Vitamin C 500mg  daily and consider tart cherry juice for extra Vitamin C. We may start you on allopurinol once your symptoms have resolved to prevent future flares. A refill for prednisone has been provided in case of another flare when starting allopurinol.   -HYPERTENSION: Hypertension, or high blood pressure, is being managed with your current medications: amlodipine, valsartan, and metoprolol succinate. Continue with your current regimen.  -CHRONIC KIDNEY DISEASE: Chronic kidney disease affects how well your kidneys function and can contribute to conditions like gout. Continue with your current management plan.  INSTRUCTIONS: Please follow up in March 2025 for your routine physical.  Follow up plan: Return if symptoms worsen or fail to improve.  Eustaquio Boyden, MD

## 2023-03-11 NOTE — Telephone Encounter (Signed)
Please offer appt at 4pm for today.

## 2023-03-21 ENCOUNTER — Telehealth: Payer: Self-pay | Admitting: Family Medicine

## 2023-03-21 MED ORDER — PREDNISONE 20 MG PO TABS: ORAL_TABLET | ORAL | 1 refills | Status: DC

## 2023-03-21 MED ORDER — PREDNISONE 20 MG PO TABS: ORAL_TABLET | ORAL | 0 refills | Status: DC

## 2023-03-21 NOTE — Addendum Note (Signed)
Addended by: Eustaquio Boyden on: 03/21/2023 02:03 PM   Modules accepted: Orders

## 2023-03-21 NOTE — Telephone Encounter (Signed)
Spoke with patient.  Finished prednisone Friday - gout still present (swollen) but is improved. Worse with wearing close toed shoes.  Will restart prednisone taper.  She is taking vit C 500mg  + tart cherry juice daily. Has not yet started allopurinol.

## 2023-03-21 NOTE — Telephone Encounter (Signed)
Pt called in and stated that she finish the prednisone on Friday for Gout but pt stated she still has pt would like to know what she needs to do and would like a call back

## 2023-04-04 ENCOUNTER — Encounter: Payer: Self-pay | Admitting: Family Medicine

## 2023-04-04 NOTE — Telephone Encounter (Signed)
Noted  

## 2023-04-04 NOTE — Telephone Encounter (Signed)
Per 02/16/23 OV notes, Carrie Lucas was to schedule OV with Dr Patsy Lager if ongoing R shoulder pain.   Lvm asking Carrie Lucas to call back. Needs to schedule OV with Dr Patsy Lager for ongoing R shoulder pain.   I also sent this info to Carrie Lucas via MyChart.

## 2023-04-04 NOTE — Telephone Encounter (Signed)
Spoke with patient, scheduled for OV 12/11 with Dr. Patsy Lager.

## 2023-04-05 NOTE — Progress Notes (Unsigned)
    Toleen Lachapelle T. Hristopher Missildine, MD, CAQ Sports Medicine [facility] at [facility] [address] [place], [address]  Phone: [phone]  FAX: [phone]  CHARNELLE ALBERTI - 82 y.o. female  MRN [id number]  Date of Birth: [birth year]  Date: 04/06/2023  PCP: Eustaquio Boyden, MD  Referral: Eustaquio Boyden, MD  No chief complaint on file.  Subjective:   WESLIE WARBURTON is a 82 y.o. very pleasant female patient with There is no height or weight on file to calculate BMI. who presents with the following:  She is a very pleasant 82 year old lady who is a new patient to me, and she presents with some ongoing right-sided shoulder pain.    Review of Systems is noted in the HPI, as appropriate  Objective:   There were no vitals taken for this visit.  GEN: No acute distress; alert,appropriate. PULM: Breathing comfortably in no respiratory distress PSYCH: Normally interactive.   Laboratory and Imaging Data:  Assessment and Plan:   ***

## 2023-04-06 ENCOUNTER — Encounter: Payer: Self-pay | Admitting: Family Medicine

## 2023-04-06 ENCOUNTER — Ambulatory Visit
Admission: RE | Admit: 2023-04-06 | Discharge: 2023-04-06 | Disposition: A | Discharge status: Home / Self Care | Payer: Medicare Other | Source: Ambulatory Visit | Attending: Family Medicine | Admitting: Family Medicine

## 2023-04-06 ENCOUNTER — Ambulatory Visit (INDEPENDENT_AMBULATORY_CARE_PROVIDER_SITE_OTHER): Payer: Medicare Other | Admitting: Family Medicine

## 2023-04-06 VITALS — BP 100/60 | HR 70 | Temp 99.1°F | Ht 61.25 in | Wt 154.5 lb

## 2023-04-06 DIAGNOSIS — M25511 Pain in right shoulder: Secondary | ICD-10-CM

## 2023-04-06 DIAGNOSIS — M19011 Primary osteoarthritis, right shoulder: Secondary | ICD-10-CM

## 2023-04-22 DIAGNOSIS — H52203 Unspecified astigmatism, bilateral: Secondary | ICD-10-CM | POA: Diagnosis not present

## 2023-04-22 DIAGNOSIS — H11003 Unspecified pterygium of eye, bilateral: Secondary | ICD-10-CM | POA: Diagnosis not present

## 2023-04-22 DIAGNOSIS — Z961 Presence of intraocular lens: Secondary | ICD-10-CM | POA: Diagnosis not present

## 2023-05-03 ENCOUNTER — Encounter: Payer: Self-pay | Admitting: Family Medicine

## 2023-05-03 DIAGNOSIS — G8929 Other chronic pain: Secondary | ICD-10-CM

## 2023-05-03 DIAGNOSIS — M19011 Primary osteoarthritis, right shoulder: Secondary | ICD-10-CM

## 2023-05-03 NOTE — Telephone Encounter (Signed)
 Fyi, last seen 04/06/23 (by Dr Patsy Lager) for same sxs.

## 2023-05-09 NOTE — Addendum Note (Signed)
 Addended by: Eustaquio Boyden on: 05/09/2023 07:20 AM   Modules accepted: Orders

## 2023-05-10 ENCOUNTER — Ambulatory Visit (INDEPENDENT_AMBULATORY_CARE_PROVIDER_SITE_OTHER): Payer: Medicare Other | Admitting: Physician Assistant

## 2023-05-10 ENCOUNTER — Encounter: Payer: Self-pay | Admitting: Sports Medicine

## 2023-05-10 ENCOUNTER — Ambulatory Visit (INDEPENDENT_AMBULATORY_CARE_PROVIDER_SITE_OTHER): Payer: Medicare Other

## 2023-05-10 ENCOUNTER — Ambulatory Visit: Payer: Medicare Other | Admitting: Physician Assistant

## 2023-05-10 ENCOUNTER — Other Ambulatory Visit: Payer: Self-pay

## 2023-05-10 ENCOUNTER — Ambulatory Visit (INDEPENDENT_AMBULATORY_CARE_PROVIDER_SITE_OTHER): Payer: Medicare Other | Admitting: Sports Medicine

## 2023-05-10 DIAGNOSIS — M25511 Pain in right shoulder: Secondary | ICD-10-CM

## 2023-05-10 DIAGNOSIS — M25512 Pain in left shoulder: Secondary | ICD-10-CM | POA: Diagnosis not present

## 2023-05-10 DIAGNOSIS — M19011 Primary osteoarthritis, right shoulder: Secondary | ICD-10-CM

## 2023-05-10 DIAGNOSIS — G8929 Other chronic pain: Secondary | ICD-10-CM

## 2023-05-10 MED ORDER — BUPIVACAINE HCL 0.25 % IJ SOLN
2.0000 mL | INTRAMUSCULAR | Status: AC | PRN
  Administered 2023-05-10: 2 mL via INTRA_ARTICULAR

## 2023-05-10 MED ORDER — LIDOCAINE HCL 1 % IJ SOLN
2.0000 mL | INTRAMUSCULAR | Status: AC | PRN
  Administered 2023-05-10: 2 mL

## 2023-05-10 MED ORDER — METHYLPREDNISOLONE ACETATE 40 MG/ML IJ SUSP
40.0000 mg | INTRAMUSCULAR | Status: AC | PRN
  Administered 2023-05-10: 40 mg via INTRA_ARTICULAR

## 2023-05-10 NOTE — Progress Notes (Signed)
   Procedure Note  Patient: Carrie Lucas             Date of Birth: Jan 20, 1941           MRN: [id number]             Visit Date: 05/10/2023  Procedures: Visit Diagnoses:  1. Chronic right shoulder pain   2. Primary osteoarthritis, right shoulder    Large Joint Inj: R glenohumeral on 05/10/2023 3:50 PM Indications: pain Details: 22 G 3.5 in needle, ultrasound-guided posterior approach Medications: 2 mL lidocaine  1 %; 2 mL bupivacaine  0.25 %; 40 mg methylPREDNISolone  acetate 40 MG/ML Outcome: tolerated well, no immediate complications  US -guided glenohumeral joint injection, right shoulder After discussion on risks/benefits/indications, informed verbal consent was obtained. A timeout was then performed. The patient was positioned lying lateral recumbent on examination table. The patient's shoulder was prepped with betadine and multiple alcohol swabs and utilizing ultrasound guidance, the patient's glenohumeral joint was identified on ultrasound. Using ultrasound guidance a 22-gauge, 3.5 inch needle with a mixture of 2:2:1 cc's lidocaine :bupivicaine:depomedrol was directed from a lateral to medial direction via in-plane technique into the glenohumeral joint with visualization of appropriate spread of injectate into the joint. Patient tolerated the procedure well without immediate complications.      Procedure, treatment alternatives, risks and benefits explained, specific risks discussed. Consent was given by the patient. Immediately prior to procedure a time out was called to verify the correct patient, procedure, equipment, support staff and site/side marked as required. Patient was prepped and draped in the usual sterile fashion.     - f/u with Morna Nadene Cummins as indicated; I am happy to see her back as needed  Lonell Sprang, DO Primary Care Sports Medicine Physician  [facility]  This note was dictated using Dragon naturally speaking software and may  contain errors in syntax, spelling, or content which have not been identified prior to signing this note.

## 2023-05-10 NOTE — Progress Notes (Signed)
 Office Visit Note   Patient: Carrie Lucas           Date of Birth: [birth year]           MRN: [id number] Visit Date: 05/10/2023              Requested by: Rilla Baller, MD [address] [place],  [place] [address] PCP: Rilla Baller, MD   Assessment & Plan: Visit Diagnoses:  1. Acute pain of left shoulder   2. Chronic pain of both shoulders     Plan: Impression is right shoulder advanced glenohumeral osteoarthritis and left shoulder pain likely from overuse.  In regards to the right shoulder, we have discussed referral to Dr. Burnetta for ultrasound-guided glenohumeral cortisone injection.  We have also discussed referral to either Dr. Addie or Dr. Genelle for shoulder replacement surgery should the injection fail to provide any relief.  She will let us  know.  In regards to the left shoulder, she does not feel this is bad enough for any sort of injection.  We are hopeful that this will improve over time.  Follow-up with us  as needed.  Follow-Up Instructions: Return if symptoms worsen or fail to improve.   Orders:  Orders Placed This Encounter  Procedures   XR Shoulder Left   No orders of the defined types were placed in this encounter.     Procedures: No procedures performed   Clinical Data: No additional findings.   Subjective: Chief Complaint  Patient presents with   Left Shoulder - Pain   Right Shoulder - Pain    HPI patient is a pleasant 83 year old female who comes in today with bilateral shoulder pain right far worse than left.  Symptoms have been ongoing for the past several months and have progressively worsened.  The pain is to the proximal deltoid.  Symptoms are described as a constant throb worse lying on the right side.  She has tried Tylenol  and tramadol  as well as topical NSAIDs without relief.  Had previous cortisone injection to the right or left shoulders.  Review of Systems as detailed in HPI.  All others reviewed and are  negative.   Objective: Vital Signs: There were no vitals taken for this visit.  Physical Exam well-developed well-nourished female in no acute distress.  Alert and oriented x 3.  Ortho Exam bilateral shoulder exam: Full active range of motion in all planes.  Internal rotation to T12 although she does have pain with this motion on the right.  Negative empty can test on the left.  Pain with empty can testing on the right.  Negative speeds and negative O'Brien's.  Full strength throughout.  She is neurovascularly intact distally.  Specialty Comments:  No specialty comments available.  Imaging: XR Shoulder Left Result Date: 05/10/2023 Moderate glenohumeral and AC joint degenerative changes with calcification to the superolateral humeral head    PMFS History: Patient Active Problem List   Diagnosis Date Noted   Podagra 02/16/2023   Anemia in chronic kidney disease (CKD) 01/21/2023   Urge incontinence 01/21/2023   S/P lumbar fusion 09/03/2022   Low serum vitamin B12 06/21/2022   Chronic fatigue 01/19/2022   Lumbar facet arthropathy 12/26/2021   Lumbar spinal stenosis 10/05/2021   Dysplastic nevus 08/04/2021   Hypercalcemia 08/01/2021   Positive ANA (antinuclear antibody) 03/05/2021   Erosive osteoarthritis of both hands 01/20/2021   Chronic venous insufficiency of lower extremity 07/01/2020   Peripheral neuropathy 07/01/2020   Lymphocytosis 01/07/2020  Bilateral hearing loss 01/07/2020   Dizziness 07/23/2019   Vision problem 07/05/2019   Pedal edema 09/11/2018   Renal cyst, acquired, right 07/08/2018   Spondylolisthesis, lumbar region 12/20/2017   UTI (urinary tract infection) 10/22/2016   BPPV (benign paroxysmal positional vertigo), left 09/07/2016   Advanced care planning/counseling discussion 12/16/2014   Carotid stenosis, asymptomatic 12/16/2014   Pterygium of both eyes 05/06/2014   History of radial keratotomy 03/13/2014   Medicare annual wellness visit, subsequent  12/12/2013   CKD (chronic kidney disease) stage 3, GFR 30-59 ml/min (HCC) 12/12/2013   DDD (degenerative disc disease), lumbar    Hepatic steatosis    Osteopenia with high risk of fracture    HTN (hypertension)    HLD (hyperlipidemia)    LPRD (laryngopharyngeal reflux disease)    Insomnia    IBS (irritable bowel syndrome)    Osteoarthritis    Abdominal bloating 09/09/2013   Past Medical History:  Diagnosis Date   Arthritis    R middle finger, chronic arthralgias   BCC (basal cell carcinoma of skin) 06/2015, 06/2017   supraclavicular, nasal bridge, R lower back - [name]   Carotid stenosis, asymptomatic 12/16/2014   Mild bilateral 1-39%, consider rpt US  1 yr (11/2014)    Chronic renal disease, stage III (HCC)    Colon polyp    DDD (degenerative disc disease), lumbar 2014   severe facet degeneration L4/5, mod spinal setnosis, mild foraminal stenosis   Dysplastic nevus 08/04/2021   To mid upper back, with severe atypia, rec re-excision ([name]) 06/2021   Erosive osteoarthritis    both hands   Headache    Hepatic steatosis 2012   mild by CT   History of melanoma 2003   face (Dr Alexa)   HLD (hyperlipidemia)    HTN (hypertension)    IBS (irritable bowel syndrome)    Insomnia    LPRD (laryngopharyngeal reflux disease)    [place])   Osteopenia 2011   DEXA T -1.6 hip, -1.3 forearm (12/2015)   Palpitations    Sialadenitis 05/2013   acute [facility]len)   SK (seborrheic keratosis) 06/2020   L lateral upper back ([name])    Family History  Problem Relation Age of Onset   Osteoarthritis Mother    Pulmonary fibrosis Mother 9   Arthritis Mother    Thyroid  disease Mother    Diabetes Father    Hypertension Father    Stroke Father 54   Hyperlipidemia Father    Cancer Sister        breast   CAD Sister    Cancer Sister        breast   Heart attack Sister    Thyroid  disease Sister    Stroke Sister    Cervical cancer Sister    Yvone' disease Sister    Sleep apnea Sister     Osteoarthritis Maternal Grandmother     Past Surgical History:  Procedure Laterality Date   ABI  06/2010   WNL   BACK SURGERY  1995   lumbar   BILATERAL OOPHORECTOMY  2007   ovarian cysts   BLADDER SUSPENSION     midurethral sling for SUI   CARPAL TUNNEL RELEASE Bilateral 2000   COLONOSCOPY  8 30 12    TAx1, diverticulosis, rpt 5 yrs (Outlaw)   COLONOSCOPY  03/2016   WNL (Outlaw)   DEXA  2011   mild osteopenia (T -1.2 R femur)   ESI  2014   L L4 transforaminal   ESOPHAGOGASTRODUODENOSCOPY  12/24/2010  reflux esophagitis   LAMINECTOMY WITH POSTERIOR LATERAL ARTHRODESIS LEVEL 2 N/A 09/03/2022   Procedure: Laminectomy and Foraminotomy - Lumbar Three-Lumbar Four, posterolateral fusion and segmental instrumentation Lumbar Three-Five;  Surgeon: Joshua Alm RAMAN, MD;  Location: [facility] OR;  Service: Neurosurgery;  Laterality: N/A;   MOHS SURGERY  2016   BCC   RADIAL KERATOTOMY Left    TONSILLECTOMY  Childhood   VAGINAL HYSTERECTOMY     bladder drop   Social History   Occupational History   Occupation: retired  Tobacco Use   Smoking status: Never   Smokeless tobacco: Never  Vaping Use   Vaping status: Never Used  Substance and Sexual Activity   Alcohol use: No    Alcohol/week: 0.0 standard drinks of alcohol   Drug use: No   Sexual activity: Yes    Birth control/protection: Surgical

## 2023-06-01 ENCOUNTER — Encounter: Payer: Self-pay | Admitting: Family Medicine

## 2023-06-01 ENCOUNTER — Other Ambulatory Visit: Payer: Self-pay | Admitting: Family Medicine

## 2023-06-01 DIAGNOSIS — Z1231 Encounter for screening mammogram for malignant neoplasm of breast: Secondary | ICD-10-CM

## 2023-06-01 DIAGNOSIS — Z0000 Encounter for general adult medical examination without abnormal findings: Secondary | ICD-10-CM

## 2023-06-27 ENCOUNTER — Telehealth: Payer: Self-pay

## 2023-06-27 NOTE — Telephone Encounter (Signed)
 Copied from CRM [id number]. Topic: Appointments - Appointment Scheduling >> Jun 27, 2023  9:37 AM Kathryne Eriksson wrote: Patient/patient representative is calling to schedule an appointment. Refer to attachments for appointment information. >> Jun 27, 2023  9:38 AM Kathryne Eriksson wrote: Patient called in wanting to schedule for labs prior to having her office visit on March 28th.

## 2023-06-27 NOTE — Telephone Encounter (Signed)
 Ok to schedule labs 1 wk prior to OV.

## 2023-06-27 NOTE — Telephone Encounter (Signed)
 Per 01/21/2024 OV notes, looks like pt is scheduled for 6 mo f/u. Does she need labs prior?

## 2023-06-28 ENCOUNTER — Telehealth: Payer: Self-pay | Admitting: Neurology

## 2023-06-28 NOTE — Telephone Encounter (Signed)
Patient called back lab appointment scheduled.

## 2023-06-28 NOTE — Telephone Encounter (Signed)
 Patient left a voicemail on our phone wanting to r/s her SS but she last saw Dr. Frances Furbish in Oct 2023 and she has [employer].

## 2023-06-28 NOTE — Telephone Encounter (Signed)
 Left message to return call to our office.

## 2023-06-28 NOTE — Telephone Encounter (Signed)
 Spoke with patient, patient is scheduled to see Carrie Lucas for 06/29/23 at 12:45pm

## 2023-06-29 ENCOUNTER — Encounter: Payer: Self-pay | Admitting: Adult Health

## 2023-06-29 ENCOUNTER — Ambulatory Visit (INDEPENDENT_AMBULATORY_CARE_PROVIDER_SITE_OTHER): Admitting: Adult Health

## 2023-06-29 VITALS — BP 126/56 | HR 65 | Ht 61.0 in | Wt 156.0 lb

## 2023-06-29 DIAGNOSIS — R002 Palpitations: Secondary | ICD-10-CM | POA: Diagnosis not present

## 2023-06-29 DIAGNOSIS — R519 Headache, unspecified: Secondary | ICD-10-CM | POA: Diagnosis not present

## 2023-06-29 DIAGNOSIS — R0683 Snoring: Secondary | ICD-10-CM | POA: Diagnosis not present

## 2023-06-29 DIAGNOSIS — Z9189 Other specified personal risk factors, not elsewhere classified: Secondary | ICD-10-CM | POA: Diagnosis not present

## 2023-06-29 DIAGNOSIS — G4719 Other hypersomnia: Secondary | ICD-10-CM

## 2023-06-29 DIAGNOSIS — G4700 Insomnia, unspecified: Secondary | ICD-10-CM | POA: Diagnosis not present

## 2023-06-29 NOTE — Patient Instructions (Signed)
 Your Plan:  Orders will be placed to pursue sleep study  You will be called after completion   If a CPAP is needed, you will be called to schedule a 31-90 day follow up visit       Thank you for coming to see [place] at [facility]. I hope we have been able to provide you high quality care today.  You may receive a patient satisfaction survey over the next few weeks. We would appreciate your feedback and comments so that we may continue to improve ourselves and the health of our patients.

## 2023-06-29 NOTE — Progress Notes (Addendum)
 [facility] [address] [place]. [place] [address] [phone]       OFFICE FOLLOW UP NOTE  Ms. BRITLEY GASHI Date of Birth:  11/17/1940 Medical Record Number:  [id number]    Primary neurologist: Dr. Frances Furbish Reason for visit: Sleep follow-up    SUBJECTIVE:   CHIEF COMPLAINT:  Chief Complaint  Patient presents with   Obstructive Sleep Apnea    Rm 3 alone Pt is well, here to discuss getting sleep study completed.  Still having snoring and chronic difficulty initiating and maintaining sleep. Denies known apnea events.     Follow-up visit:  Prior visit: 02/22/2022 with Dr. Frances Furbish (initial consult visit)  Brief HPI:   JOHNNISHA FORTON is a 83 y.o. female who was evaluated by Dr. Frances Furbish in 01/2022 for concern of underlying sleep apnea and chronic difficulty initiating and maintaining sleep with reports of snoring and nonrestorative sleep.  It was recommended proceeding with a sleep study but was never pursued.    Interval history:  She returns today as she is now interested in pursuing sleep study.  It was required by insurance that she is seen again in office prior to proceeding as it has been over a year since her prior visit. She continues to struggle with initiating and maintaining sleep, excessive daytime sleepiness, snoring and frequent dull headaches.  Reports PCP has completed multiple labs to look for causes of fatigue which have all been benign.  ESS 5/24. FSS 37/63.  Reports previously having to cancel sleep studies previously as her husband had a stroke in 2023 and then she underwent lumbar fusion in 2024.  Mentions ongoing symptom of random heart palpitation, feels like her heart "hiccups", usually while at rest, no other associated symptoms.  Has had prior EKGs which have shown NSR.      ROS:   14 system review of systems performed and negative with exception of those listed in HPI  PMH:  Past Medical History:  Diagnosis Date   Arthritis     R middle finger, chronic arthralgias   BCC (basal cell carcinoma of skin) 06/2015, 06/2017   supraclavicular, nasal bridge, R lower back - [name]   Carotid stenosis, asymptomatic 12/16/2014   Mild bilateral 1-39%, consider rpt [place] 1 yr (11/2014)    Chronic renal disease, stage III (HCC)    Colon polyp    DDD (degenerative disc disease), lumbar 2014   severe facet degeneration L4/5, mod spinal setnosis, mild foraminal stenosis   Dysplastic nevus 08/04/2021   To mid upper back, with severe atypia, rec re-excision ([name]) 06/2021   Erosive osteoarthritis    both hands   Headache    Hepatic steatosis 2012   mild by CT   History of melanoma 2003   face (Dr Dawna Part)   HLD (hyperlipidemia)    HTN (hypertension)    IBS (irritable bowel syndrome)    Insomnia    LPRD (laryngopharyngeal reflux disease)    Jenne Pane)   Osteopenia 2011   DEXA T -1.6 hip, -1.3 forearm (12/2015)   Palpitations    Sialadenitis 05/2013   acute Jenne Pane)   SK (seborrheic keratosis) 06/2020   L lateral upper back ([name])    PSH:  Past Surgical History:  Procedure Laterality Date   ABI  06/2010   WNL   BACK SURGERY  1995   lumbar   BILATERAL OOPHORECTOMY  2007   ovarian cysts   BLADDER SUSPENSION     midurethral sling for SUI   CARPAL  TUNNEL RELEASE Bilateral 2000   COLONOSCOPY  8 30 12    TAx1, diverticulosis, rpt 5 yrs (Outlaw)   COLONOSCOPY  03/2016   WNL (Outlaw)   DEXA  2011   mild osteopenia (T -1.2 R femur)   ESI  2014   L L4 transforaminal   ESOPHAGOGASTRODUODENOSCOPY  12/24/2010   reflux esophagitis   LAMINECTOMY WITH POSTERIOR LATERAL ARTHRODESIS LEVEL 2 N/A 09/03/2022   Procedure: Laminectomy and Foraminotomy - Lumbar Three-Lumbar Four, posterolateral fusion and segmental instrumentation Lumbar Three-Five;  Surgeon: Tia Alert, MD;  Location: [facility] OR;  Service: Neurosurgery;  Laterality: N/A;   MOHS SURGERY  2016   BCC   RADIAL KERATOTOMY Left    TONSILLECTOMY  Childhood   VAGINAL  HYSTERECTOMY     bladder drop    Social History:  Social History   Socioeconomic History   Marital status: Married    Spouse name: Not on file   Number of children: 1   Years of education: Not on file   Highest education level: Some college, no degree  Occupational History   Occupation: retired  Tobacco Use   Smoking status: Never   Smokeless tobacco: Never  Vaping Use   Vaping status: Never Used  Substance and Sexual Activity   Alcohol use: No    Alcohol/week: 0.0 standard drinks of alcohol   Drug use: No   Sexual activity: Yes    Birth control/protection: Surgical  Other Topics Concern   Not on file  Social History Narrative   Lives with husband   Activity: Advertising account executive, 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 Radio broadcast assistant   Derm - Dr. Doreen Beam (was Dr Londell Moh but he retired)   Dentist - Dr. Kathyrn Drown   ENT - Dr Jenne Pane   Nephrologist- Dr Malen Gauze    Social Drivers of Health   Financial Resource Strain: Low Risk  (01/02/2019)   Overall Financial Resource Strain (CARDIA)    Difficulty of Paying Living Expenses: Not hard at all  Food Insecurity: No Food Insecurity (01/02/2019)   Hunger Vital Sign    Worried About Running Out of Food in the Last Year: Never true    Ran Out of Food in the Last Year: Never true  Transportation Needs: No Transportation Needs (01/02/2019)   PRAPARE - Administrator, Civil Service (Medical): No    Lack of Transportation (Non-Medical): No  Physical Activity: Insufficiently Active (01/02/2019)   Exercise Vital Sign    Days of Exercise per Week: 3 days    Minutes of Exercise per Session: 20 min  Stress: No Stress Concern Present (01/02/2019)   [employer] of Occupational Health - Occupational Stress Questionnaire    Feeling of Stress : Not at all  Social Connections: Not on file   Intimate Partner Violence: Not At Risk (01/02/2019)   Humiliation, Afraid, Rape, and Kick questionnaire    Fear of Current or Ex-Partner: No    Emotionally Abused: No    Physically Abused: No    Sexually Abused: No    Family History:  Family History  Problem Relation Age of Onset   Osteoarthritis Mother    Pulmonary fibrosis Mother 53   Arthritis Mother    Thyroid disease Mother    Diabetes Father    Hypertension Father    Stroke  Father 31   Hyperlipidemia Father    Cancer Sister        breast   CAD Sister    Cancer Sister        breast   Heart attack Sister    Thyroid disease Sister    Stroke Sister    Cervical cancer Sister    Luiz Blare' disease Sister    Sleep apnea Sister    Osteoarthritis Maternal Grandmother     Medications:   Current Outpatient Medications on File Prior to Visit  Medication Sig Dispense Refill   acetaminophen (TYLENOL) 650 MG CR tablet Take 1 tablet (650 mg total) by mouth 3 (three) times daily as needed for pain.     allopurinol (ZYLOPRIM) 100 MG tablet Take 1 tablet (100 mg total) by mouth daily. 30 tablet 6   amLODipine (NORVASC) 2.5 MG tablet Take 2.5 mg by mouth daily.     ascorbic acid (VITAMIN C) 500 MG tablet Take 1 tablet (500 mg total) by mouth daily.     aspirin EC 81 MG tablet Take 1 tablet (81 mg total) by mouth every Monday, Wednesday, and Friday.     atorvastatin (LIPITOR) 20 MG tablet Take 1 tablet (20 mg total) by mouth daily. 90 tablet 4   b complex vitamins capsule Take 1 capsule by mouth daily.     Cholecalciferol (VITAMIN D3) 1000 units CAPS Take 1,000 Units by mouth daily.     cyanocobalamin 1000 MCG tablet Take 1 tablet (1,000 mcg total) by mouth every Monday, Wednesday, and Friday.     metoprolol succinate (TOPROL-XL) 25 MG 24 hr tablet Take 0.5 tablets (12.5 mg total) by mouth daily. 45 tablet 4   traMADol (ULTRAM) 50 MG tablet TAKE 1 TABLET BY MOUTH TWICE DAILY AS NEEDED FOR  MODERATE  PAIN 30 tablet 0   valsartan (DIOVAN)  320 MG tablet Take 1 tablet (320 mg total) by mouth daily. 90 tablet 4   No current facility-administered medications on file prior to visit.    Allergies:   Allergies  Allergen Reactions   Naproxen Itching      OBJECTIVE:  Physical Exam  Vitals:   06/29/23 1240  BP: (!) 126/56  Pulse: 65  Weight: 156 lb (70.8 kg)  Height: 5\' 1"  (1.549 m)   Body mass index is 29.48 kg/m. No results found.  General: well developed, well nourished, very pleasant elderly Caucasian female, seated, in no evident distress Head: head normocephalic and atraumatic.   Neck: supple with no carotid or supraclavicular bruits Cardiovascular: regular rate and rhythm, no murmurs Musculoskeletal: no deformity Skin:  no rash/petichiae Vascular:  Normal pulses all extremities   Neurologic Exam Mental Status: Awake and fully alert. Oriented to place and time. Recent and remote memory intact. Attention span, concentration and fund of knowledge appropriate. Mood and affect appropriate.  Cranial Nerves: Pupils equal, briskly reactive to light. Extraocular movements full without nystagmus. Visual fields full to confrontation. Hearing intact. Facial sensation intact. Face, tongue, palate moves normally and symmetrically.  Motor: Normal bulk and tone. Normal strength in all tested extremity muscles Gait and Station: Arises from chair without difficulty. Stance is normal. Gait demonstrates normal stride length and balance without use of AD.         ASSESSMENT/PLAN: CHLOEY RICARD is a 83 y.o. year old female with concern of underlying sleep apnea with ongoing complaints of initiating and maintaining sleep, excessive daytime somnolence, snoring and daily dull headaches.  Also mentions ongoing symptoms of  heart "hiccup" or extra beat which can occur randomly throughout the day typically at rest.    At risk for sleep apnea:  Order will be placed to complete either in lab sleep study or HST (based on insurance  approval) Order will be placed for sleep study (previously ordered 01/2022 but was never pursued) If apnea found, will need f/u visit 31-90 days after initiating therapy heart palpitations Suspect possible PVC/PAC but encouraged f/u with PCP, consider cardiac monitor for further evaluation but will defer to PCP, plan on repeat lab work in 2 weeks with PCP     CC:  PCP: Eustaquio Boyden, MD    I spent 30 minutes of face-to-face and non-face-to-face time with patient.  This included previsit chart review, lab review, study review, order entry, electronic health record documentation, patient education and discussion regarding above diagnoses and treatment plan and answered all other questions to patient's satisfaction  Ihor Austin, [facility]  [facility] [address] [place], [place] [address]  Phone [phone] Fax [phone] Note: This document was prepared with digital dictation and possible smart phrase technology. Any transcriptional errors that result from this process are unintentional.

## 2023-07-01 ENCOUNTER — Ambulatory Visit
Admission: RE | Admit: 2023-07-01 | Discharge: 2023-07-01 | Disposition: A | Discharge status: Home / Self Care | Payer: Medicare Other | Source: Ambulatory Visit | Attending: Family Medicine | Admitting: Family Medicine

## 2023-07-01 DIAGNOSIS — Z1231 Encounter for screening mammogram for malignant neoplasm of breast: Secondary | ICD-10-CM

## 2023-07-06 ENCOUNTER — Encounter: Payer: Self-pay | Admitting: Family Medicine

## 2023-07-06 ENCOUNTER — Ambulatory Visit: Admitting: Family Medicine

## 2023-07-06 VITALS — BP 130/70 | HR 79 | Temp 99.6°F | Resp 16 | Ht 61.0 in | Wt 157.0 lb

## 2023-07-06 DIAGNOSIS — J069 Acute upper respiratory infection, unspecified: Secondary | ICD-10-CM

## 2023-07-06 DIAGNOSIS — R197 Diarrhea, unspecified: Secondary | ICD-10-CM | POA: Diagnosis not present

## 2023-07-06 DIAGNOSIS — R059 Cough, unspecified: Secondary | ICD-10-CM

## 2023-07-06 LAB — POCT INFLUENZA A/B
Influenza A, POC: NEGATIVE
Influenza B, POC: NEGATIVE

## 2023-07-06 LAB — POC COVID19 BINAXNOW: SARS Coronavirus 2 Ag: NEGATIVE

## 2023-07-06 MED ORDER — HYDROCODONE BIT-HOMATROP MBR 5-1.5 MG/5ML PO SOLN: 5.0000 mL | Freq: Every evening | ORAL | 0 refills | Status: AC | PRN

## 2023-07-06 MED ORDER — BENZONATATE 100 MG PO CAPS: 100.0000 mg | ORAL_CAPSULE | Freq: Two times a day (BID) | ORAL | 0 refills | Status: AC | PRN

## 2023-07-06 NOTE — Progress Notes (Signed)
 ACUTE VISIT Chief Complaint  Patient presents with   Cough    Started on Monday   Chills   Diarrhea   HPI: Carrie Lucas is a 83 y.o. female with a PMHx significant for HTN, chronic venous insufficiency, LPRD, IBS, benign paroxysmal positional vertigo, OA, HLD, CKD III, and anemia, among others, who is here today with above complaints.  Patient complains of dry cough and chills since 3/10. She has also had diarrhea since the morning of 3/11, fever as high as 101.1, body aches and sleep interference from coughing, and some fatigue.  Sore throat when coughing.  Cough This is a new problem. The current episode started in the past 7 days. The problem has been unchanged. The problem occurs every few hours. The cough is Non-productive. Associated symptoms include a fever, myalgias, postnasal drip and rhinorrhea. Pertinent negatives include no chest pain, chills, ear congestion, ear pain, headaches, heartburn, hemoptysis or nasal congestion. Nothing aggravates the symptoms. She has tried OTC cough suppressant for the symptoms. There is no history of environmental allergies.  Diarrhea  This is a new problem. The current episode started yesterday. The problem occurs 2 to 4 times per day. The problem has been unchanged. Associated symptoms include coughing, a fever, myalgias and a URI. Pertinent negatives include no abdominal pain, bloating, chills or headaches.   She says she has had 2-3 stools today and her stools are watery. No mucus or blood.  She has been taking tylenol for the fever and delsym for the cough.   Pertinent negatives include wheezing,SOB. nausea, vomiting, abdominal pain, urinary problems, recent travel, time spent around lakes, or insect bites.   Review of Systems  Constitutional:  Positive for activity change, appetite change and fever. Negative for chills.  HENT:  Positive for postnasal drip and rhinorrhea. Negative for ear pain, facial swelling, mouth sores and trouble  swallowing.   Respiratory:  Positive for cough. Negative for hemoptysis.   Cardiovascular:  Negative for chest pain.  Gastrointestinal:  Positive for diarrhea. Negative for abdominal pain, bloating and heartburn.  Genitourinary:  Negative for decreased urine volume, dysuria and hematuria.  Musculoskeletal:  Positive for myalgias.  Allergic/Immunologic: Negative for environmental allergies.  Neurological:  Negative for syncope, weakness and headaches.  Psychiatric/Behavioral:  Negative for confusion and hallucinations.   See other pertinent positives and negatives in HPI.  Current Outpatient Medications on File Prior to Visit  Medication Sig Dispense Refill   acetaminophen (TYLENOL) 650 MG CR tablet Take 1 tablet (650 mg total) by mouth 3 (three) times daily as needed for pain.     allopurinol (ZYLOPRIM) 100 MG tablet Take 1 tablet (100 mg total) by mouth daily. 30 tablet 6   amLODipine (NORVASC) 2.5 MG tablet Take 2.5 mg by mouth daily.     ascorbic acid (VITAMIN C) 500 MG tablet Take 1 tablet (500 mg total) by mouth daily.     aspirin EC 81 MG tablet Take 1 tablet (81 mg total) by mouth every Monday, Wednesday, and Friday.     atorvastatin (LIPITOR) 20 MG tablet Take 1 tablet (20 mg total) by mouth daily. 90 tablet 4   b complex vitamins capsule Take 1 capsule by mouth daily.     Cholecalciferol (VITAMIN D3) 1000 units CAPS Take 1,000 Units by mouth daily.     cyanocobalamin 1000 MCG tablet Take 1 tablet (1,000 mcg total) by mouth every Monday, Wednesday, and Friday.     metoprolol succinate (TOPROL-XL) 25 MG 24 hr  tablet Take 0.5 tablets (12.5 mg total) by mouth daily. 45 tablet 4   traMADol (ULTRAM) 50 MG tablet TAKE 1 TABLET BY MOUTH TWICE DAILY AS NEEDED FOR  MODERATE  PAIN 30 tablet 0   valsartan (DIOVAN) 320 MG tablet Take 1 tablet (320 mg total) by mouth daily. 90 tablet 4   No current facility-administered medications on file prior to visit.    Past Medical History:  Diagnosis  Date   Arthritis    R middle finger, chronic arthralgias   BCC (basal cell carcinoma of skin) 06/2015, 06/2017   supraclavicular, nasal bridge, R lower back - [name]   Carotid stenosis, asymptomatic 12/16/2014   Mild bilateral 1-39%, consider rpt [place] 1 yr (11/2014)    Chronic renal disease, stage III (HCC)    Colon polyp    DDD (degenerative disc disease), lumbar 2014   severe facet degeneration L4/5, mod spinal setnosis, mild foraminal stenosis   Dysplastic nevus 08/04/2021   To mid upper back, with severe atypia, rec re-excision ([name]) 06/2021   Erosive osteoarthritis    both hands   Headache    Hepatic steatosis 2012   mild by CT   History of melanoma 2003   face (Dr Dawna Part)   HLD (hyperlipidemia)    HTN (hypertension)    IBS (irritable bowel syndrome)    Insomnia    LPRD (laryngopharyngeal reflux disease)    Jenne Pane)   Osteopenia 2011   DEXA T -1.6 hip, -1.3 forearm (12/2015)   Palpitations    Sialadenitis 05/2013   acute Jenne Pane)   SK (seborrheic keratosis) 06/2020   L lateral upper back ([name])   Allergies  Allergen Reactions   Naproxen Itching    Social History   Socioeconomic History   Marital status: Married    Spouse name: Not on file   Number of children: 1   Years of education: Not on file   Highest education level: Some college, no degree  Occupational History   Occupation: retired  Tobacco Use   Smoking status: Never   Smokeless tobacco: Never  Vaping Use   Vaping status: Never Used  Substance and Sexual Activity   Alcohol use: No    Alcohol/week: 0.0 standard drinks of alcohol   Drug use: No   Sexual activity: Yes    Birth control/protection: Surgical  Other Topics Concern   Not on file  Social History Narrative   Lives with husband   Activity: Advertising account executive, 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 Radio broadcast assistant   Derm - Dr. Doreen Beam (was Dr Londell Moh but he retired)   Dentist - Dr. Kathyrn Drown   ENT - Dr Jenne Pane   Nephrologist- Dr Malen Gauze    Social Drivers of Health   Financial Resource Strain: Low Risk  (01/02/2019)   Overall Financial Resource Strain (CARDIA)    Difficulty of Paying Living Expenses: Not hard at all  Food Insecurity: No Food Insecurity (01/02/2019)   Hunger Vital Sign    Worried About Running Out of Food in the Last Year: Never true    Ran Out of Food in the Last Year: Never true  Transportation Needs: No Transportation Needs (01/02/2019)   PRAPARE - Administrator, Civil Service (Medical): No    Lack of Transportation (Non-Medical): No  Physical Activity: Insufficiently  Active (01/02/2019)   Exercise Vital Sign    Days of Exercise per Week: 3 days    Minutes of Exercise per Session: 20 min  Stress: No Stress Concern Present (01/02/2019)   [employer] of Occupational Health - Occupational Stress Questionnaire    Feeling of Stress : Not at all  Social Connections: Not on file    Vitals:   07/06/23 1046  BP: 130/70  Pulse: 79  Resp: 16  Temp: 99.6 F (37.6 C)  SpO2: 95%   Body mass index is 29.66 kg/m.  Physical Exam Vitals and nursing note reviewed.  Constitutional:      General: She is not in acute distress.    Appearance: She is well-developed. She is not ill-appearing.  HENT:     Head: Normocephalic and atraumatic.     Right Ear: Tympanic membrane, ear canal and external ear normal.     Left Ear: Tympanic membrane, ear canal and external ear normal.     Nose: Rhinorrhea present.     Mouth/Throat:     Mouth: Mucous membranes are moist.     Pharynx: Oropharynx is clear. Uvula midline. Postnasal drip present.  Eyes:     Conjunctiva/sclera: Conjunctivae normal.  Cardiovascular:     Rate and Rhythm: Normal rate and regular rhythm.     Heart sounds: No murmur heard. Pulmonary:     Effort: Pulmonary effort is normal. No  respiratory distress.     Breath sounds: Normal breath sounds. No stridor.  Lymphadenopathy:     Head:     Right side of head: No submandibular adenopathy.     Left side of head: No submandibular adenopathy.     Cervical: No cervical adenopathy.  Skin:    General: Skin is warm.     Findings: No erythema or rash.  Neurological:     Mental Status: She is alert and oriented to person, place, and time.  Psychiatric:        Mood and Affect: Mood and affect normal.   ASSESSMENT AND PLAN:  Ms. Jeangilles was seen today for dry cough, chills, and diarrhea.   URI, acute Symptoms suggests a viral etiology,  She tested negative for Covid and Flu in the office today.  Symptomatic treatment recommended. Instructed to monitor for signs of complications, including SOB, CP,and wheezing among some, clearly instructed about warning signs. I also explained that cough and nasal congestion can last a few days and sometimes weeks. F/U as needed.  Cough, unspecified type Lung auscultation normal. I do not think imaging is needed. Benzonatate recommended for the day time and Hycodan at night, we discussed some side effects. If cough becomes more productive, she can try plain mucinex.  -     POCT Influenza A/B -     POC COVID-19 BinaxNow -     Benzonatate; Take 1 capsule (100 mg total) by mouth 2 (two) times daily as needed for up to 10 days.  Dispense: 20 capsule; Refill: 0 -     HYDROcodone Bit-Homatrop MBr; Take 5 mLs by mouth at bedtime as needed for up to 10 days.  Dispense: 50 mL; Refill: 0  Acute diarrhea Most likely related to above problem, viral. Could try OTC Imodium if several episodes, side effects discussed. Recommend adequate hydration, small sips of clear fluids through the day and bland diet as tolerated. Monitor for signs of dehydration.  Return if symptoms worsen or fail to improve.  Trula Ore, acting as a Neurosurgeon for  Vielka Klinedinst [place], MD., have documented all relevant  documentation on the behalf of Vickee Mormino [place], MD, as directed by  Reyna Lorenzi [place], MD while in the presence of Ashtian Villacis [place], MD.   I, Arnetra Terris [place], MD, have reviewed all documentation for this visit. The documentation on 07/06/23 for the exam, diagnosis, procedures, and orders are all accurate and complete.  Tamu Golz G. [place], MD  [facility]. [place] office.

## 2023-07-06 NOTE — Patient Instructions (Addendum)
 A few things to remember from today's visit:  Cough, unspecified type - Plan: POCT Influenza A/B, POC COVID-19, benzonatate (TESSALON) 100 MG capsule, HYDROcodone bit-homatropine (HYCODAN) 5-1.5 MG/5ML syrup  URI, acute  Acute diarrhea  Self limited. Small sips of clear fluids through the day and bland diet as tolerated. Caution with cough syrup, take it at bedtime as needed. Continue monitoring fever. Tylenol for fever and aches. Monitor for new symptoms.  If you need refills for medications you take chronically, please call your pharmacy. Do not use My Chart to request refills or for acute issues that need immediate attention. If you send a my chart message, it may take a few days to be addressed, specially if I am not in the office.  Please be sure medication list is accurate. If a new problem present, please set up appointment sooner than planned today.

## 2023-07-09 ENCOUNTER — Other Ambulatory Visit: Payer: Self-pay | Admitting: Family Medicine

## 2023-07-09 DIAGNOSIS — M109 Gout, unspecified: Secondary | ICD-10-CM

## 2023-07-09 DIAGNOSIS — N1832 Chronic kidney disease, stage 3b: Secondary | ICD-10-CM

## 2023-07-11 ENCOUNTER — Telehealth: Payer: Self-pay | Admitting: Adult Health

## 2023-07-11 NOTE — Telephone Encounter (Signed)
 [place] Medicare/AARP no auth req   Patient is scheduled at [facility] for 08/15/23 at 8 pm.  Mailed packet & sent mychart

## 2023-07-12 ENCOUNTER — Encounter (HOSPITAL_COMMUNITY): Payer: Self-pay | Admitting: Emergency Medicine

## 2023-07-12 ENCOUNTER — Ambulatory Visit: Payer: Self-pay | Admitting: Family Medicine

## 2023-07-12 ENCOUNTER — Other Ambulatory Visit: Payer: Self-pay

## 2023-07-12 ENCOUNTER — Emergency Department (HOSPITAL_COMMUNITY)
Admission: EM | Admit: 2023-07-12 | Discharge: 2023-07-12 | Disposition: A | Discharge status: Home / Self Care | Attending: Emergency Medicine | Admitting: Emergency Medicine

## 2023-07-12 ENCOUNTER — Emergency Department (HOSPITAL_COMMUNITY)

## 2023-07-12 DIAGNOSIS — R059 Cough, unspecified: Secondary | ICD-10-CM | POA: Diagnosis present

## 2023-07-12 DIAGNOSIS — Z7982 Long term (current) use of aspirin: Secondary | ICD-10-CM | POA: Insufficient documentation

## 2023-07-12 DIAGNOSIS — R051 Acute cough: Secondary | ICD-10-CM | POA: Insufficient documentation

## 2023-07-12 LAB — CBC
HCT: 35.2 % — ABNORMAL LOW (ref 36.0–46.0)
Hemoglobin: 11.4 g/dL — ABNORMAL LOW (ref 12.0–15.0)
MCH: 31.1 pg (ref 26.0–34.0)
MCHC: 32.4 g/dL (ref 30.0–36.0)
MCV: 95.9 fL (ref 80.0–100.0)
Platelets: 356 10*3/uL (ref 150–400)
RBC: 3.67 MIL/uL — ABNORMAL LOW (ref 3.87–5.11)
RDW: 13.2 % (ref 11.5–15.5)
WBC: 8.1 10*3/uL (ref 4.0–10.5)
nRBC: 0 % (ref 0.0–0.2)

## 2023-07-12 LAB — BASIC METABOLIC PANEL
Anion gap: 8 (ref 5–15)
BUN: 27 mg/dL — ABNORMAL HIGH (ref 8–23)
CO2: 22 mmol/L (ref 22–32)
Calcium: 9.2 mg/dL (ref 8.9–10.3)
Chloride: 105 mmol/L (ref 98–111)
Creatinine, Ser: 1.49 mg/dL — ABNORMAL HIGH (ref 0.44–1.00)
GFR, Estimated: 35 mL/min — ABNORMAL LOW (ref >60)
Glucose, Bld: 103 mg/dL — ABNORMAL HIGH (ref 70–99)
Potassium: 4.8 mmol/L (ref 3.5–5.1)
Sodium: 135 mmol/L (ref 135–145)

## 2023-07-12 NOTE — ED Triage Notes (Signed)
 Pt comes in via pov. She was seen at the doctors office on 12th dx with virus. Cough, fever, fatigue, diarrhea.

## 2023-07-12 NOTE — Telephone Encounter (Signed)
  Chief Complaint: low grade fever x1 week Symptoms: malaise, diarrhea, cough,  Frequency: approx 1 week.  Pertinent Negatives: Patient denies sore throat, GU s/s, SOB Disposition: [x] ED /[] Urgent Care (no appt availability in office) / [] Appointment(In office/virtual)/ []  [facility]/ [] Home Care/ [] Refused Recommended Disposition /[] [facility]/ []  Follow-up with PCP Additional Notes: Pt states that approx  a week ago she started with a cough and diarrhea. Pt states that she saw a provider, dx with virus. Given rx of cough syrup and cough drops. Pt states the cough and diarrhea have improved. Pt states that her temp has not improved. Pt states that she generally feels unwell and is extremely thirsty and feels as though she cannot get enough water. Pt advised to go to ED, pt agreeable. Pt denies GU s/s. Denies SOB, denies CP.  Copied from CRM [id number]. Topic: Appointments - Appointment Scheduling >> Jul 12, 2023  1:55 PM [place] A wrote: Patient has been sick with a constant temperature 99.8, feel tired, congestion and diarrhea Reason for Disposition  Fever present > 3 days (72 hours)  Answer Assessment - Initial Assessment Questions 1. TEMPERATURE: "What is the most recent temperature?"  "How was it measured?"       Oral thermometer 2. ONSET: "When did the fever start?"      About a week 3. CHILLS: "Do you have chills?" If yes: "How bad are they?"  (e.g., none, mild, moderate, severe)   - NONE: no chills   - MILD: feeling cold   - MODERATE: feeling very cold, some shivering (feels better under a thick blanket)   - SEVERE: feeling extremely cold with shaking chills (general body shaking, rigors; even under a thick blanket)      With onset of fever she had chills, not currently 4. OTHER SYMPTOMS: "Do you have any other symptoms besides the fever?"  (e.g., abdomen pain, cough, diarrhea, earache, headache, sore throat, urination pain)     Cough, diarrhea, HA,  5. CAUSE:  If there are no symptoms, ask: "What do you think is causing the fever?"      URI for about a week 6. CONTACTS: "Does anyone else in the family have an infection?"     denies 7. TREATMENT: "What have you done so far to treat this fever?" (e.g., medications)     Pt states she takes tylenol, helps fever. Pt states she used rx cough drops and syrup 8. IMMUNOCOMPROMISE: "Do you have of the following: diabetes, HIV positive, splenectomy, cancer chemotherapy, chronic steroid treatment, transplant patient, etc."     denies 10. TRAVEL: "Have you traveled out of the country in the last month?" (e.g., travel history, exposures)       denies  Protocols used: [facility]

## 2023-07-12 NOTE — Telephone Encounter (Signed)
Will await ER eval

## 2023-07-12 NOTE — Discharge Instructions (Signed)
 Your chest x-ray today did not show any evidence that you have pneumonia.  Follow-up with your doctor as needed return here for any trouble breathing

## 2023-07-12 NOTE — ED Provider Notes (Signed)
 [place] EMERGENCY DEPARTMENT AT [facility] Provider Note   CSN: [id number] Arrival date & time: 07/12/23  1502     History  Chief Complaint  Patient presents with   Cough    Carrie Lucas is a 83 y.o. female.  83 year old female presents with URI symptoms for over a week.  Seen by her doctor had negative COVID and flu test.  States overall she is feeling better but her cough remains.  Concern for possible pneumonia.  Had temperature 99 degrees at home.  Denies any exertional dyspnea.  No urinary symptoms.  No sore throat.  No nasal congestion.  No treatment use prior to arrival       Home Medications Prior to Admission medications   Medication Sig Start Date End Date Taking? Authorizing Provider  acetaminophen (TYLENOL) 650 MG CR tablet Take 1 tablet (650 mg total) by mouth 3 (three) times daily as needed for pain. 01/07/20   Eustaquio Boyden, MD  allopurinol (ZYLOPRIM) 100 MG tablet Take 1 tablet (100 mg total) by mouth daily. 03/11/23   Eustaquio Boyden, MD  amLODipine (NORVASC) 2.5 MG tablet Take 2.5 mg by mouth daily. 12/30/22   [provider]  ascorbic acid (VITAMIN C) 500 MG tablet Take 1 tablet (500 mg total) by mouth daily. 03/11/23   Eustaquio Boyden, MD  aspirin EC 81 MG tablet Take 1 tablet (81 mg total) by mouth every Monday, Wednesday, and Friday. 12/19/15   Eustaquio Boyden, MD  atorvastatin (LIPITOR) 20 MG tablet Take 1 tablet (20 mg total) by mouth daily. 01/21/23   Eustaquio Boyden, MD  b complex vitamins capsule Take 1 capsule by mouth daily.    [provider]  benzonatate (TESSALON) 100 MG capsule Take 1 capsule (100 mg total) by mouth 2 (two) times daily as needed for up to 10 days. 07/06/23 07/16/23  [place], Betty G, MD  Cholecalciferol (VITAMIN D3) 1000 units CAPS Take 1,000 Units by mouth daily.    [provider]  cyanocobalamin 1000 MCG tablet Take 1 tablet (1,000 mcg total) by mouth every Monday, Wednesday, and  Friday. 06/23/22   Eustaquio Boyden, MD  HYDROcodone bit-homatropine [facility]) 5-1.5 MG/5ML syrup Take 5 mLs by mouth at bedtime as needed for up to 10 days. 07/06/23 07/16/23  [place], Betty G, MD  metoprolol succinate (TOPROL-XL) 25 MG 24 hr tablet Take 0.5 tablets (12.5 mg total) by mouth daily. 01/21/23   Eustaquio Boyden, MD  traMADol (ULTRAM) 50 MG tablet TAKE 1 TABLET BY MOUTH TWICE DAILY AS NEEDED FOR  MODERATE  PAIN 08/20/22   Eustaquio Boyden, MD  valsartan (DIOVAN) 320 MG tablet Take 1 tablet (320 mg total) by mouth daily. 01/21/23   Eustaquio Boyden, MD      Allergies    Naproxen    Review of Systems   Review of Systems  All other systems reviewed and are negative.   Physical Exam Updated Vital Signs BP (!) 156/56 (BP Location: Right Arm)   Pulse 66   Temp 98.5 F (36.9 C) (Oral)   Resp 16   Ht 1.562 m (5' 1.5")   Wt 70.8 kg   SpO2 98%   BMI 29.00 kg/m  Physical Exam Vitals and nursing note reviewed.  Constitutional:      General: She is not in acute distress.    Appearance: Normal appearance. She is well-developed. She is not toxic-appearing.  HENT:     Head: Normocephalic and atraumatic.  Eyes:     General:  Lids are normal.     Conjunctiva/sclera: Conjunctivae normal.     Pupils: Pupils are equal, round, and reactive to light.  Neck:     Thyroid: No thyroid mass.     Trachea: No tracheal deviation.  Cardiovascular:     Rate and Rhythm: Normal rate and regular rhythm.     Heart sounds: Normal heart sounds. No murmur heard.    No gallop.  Pulmonary:     Effort: Pulmonary effort is normal. No respiratory distress.     Breath sounds: Normal breath sounds. No stridor. No decreased breath sounds, wheezing, rhonchi or rales.  Abdominal:     General: There is no distension.     Palpations: Abdomen is soft.     Tenderness: There is no abdominal tenderness. There is no rebound.  Musculoskeletal:        General: No tenderness. Normal range of motion.     Cervical  back: Normal range of motion and neck supple.  Skin:    General: Skin is warm and dry.     Findings: No abrasion or rash.  Neurological:     Mental Status: She is alert and oriented to person, place, and time. Mental status is at baseline.     GCS: GCS eye subscore is 4. GCS verbal subscore is 5. GCS motor subscore is 6.     Cranial Nerves: No cranial nerve deficit.     Sensory: No sensory deficit.     Motor: Motor function is intact.  Psychiatric:        Attention and Perception: Attention normal.        Speech: Speech normal.        Behavior: Behavior normal.     ED Results / Procedures / Treatments   Labs (all labs ordered are listed, but only abnormal results are displayed) Labs Reviewed  CBC - Abnormal; Notable for the following components:      Result Value   RBC 3.67 (*)    Hemoglobin 11.4 (*)    HCT 35.2 (*)    All other components within normal limits  BASIC METABOLIC PANEL - Abnormal; Notable for the following components:   Glucose, Bld 103 (*)    BUN 27 (*)    Creatinine, Ser 1.49 (*)    GFR, Estimated 35 (*)    All other components within normal limits    EKG None  Radiology No results found.  Procedures Procedures    Medications Ordered in ED Medications - No data to display  ED Course/ Medical Decision Making/ A&P                                 Medical Decision Making Amount and/or Complexity of Data Reviewed Labs: ordered. Radiology: ordered.   Patient's chest x-ray without acute findings here.  Labs otherwise reassuring.  Patient appears to have chronic kidney disease.  No leukocytosis noted on CBC.  Low suspicion for sepsis.  Suspect ongoing URI and will discharge home        Final Clinical Impression(s) / ED Diagnoses Final diagnoses:  None    Rx / DC Orders ED Discharge Orders     None         Lorre Nick, MD 07/12/23 [phone]

## 2023-07-13 ENCOUNTER — Other Ambulatory Visit (INDEPENDENT_AMBULATORY_CARE_PROVIDER_SITE_OTHER)

## 2023-07-13 DIAGNOSIS — N1832 Chronic kidney disease, stage 3b: Secondary | ICD-10-CM

## 2023-07-13 DIAGNOSIS — M109 Gout, unspecified: Secondary | ICD-10-CM | POA: Diagnosis not present

## 2023-07-13 LAB — RENAL FUNCTION PANEL
Albumin: 4.1 g/dL (ref 3.5–5.2)
BUN: 23 mg/dL (ref 6–23)
CO2: 24 meq/L (ref 19–32)
Calcium: 9.6 mg/dL (ref 8.4–10.5)
Chloride: 106 meq/L (ref 96–112)
Creatinine, Ser: 1.31 mg/dL — ABNORMAL HIGH (ref 0.40–1.20)
GFR: 37.81 mL/min — ABNORMAL LOW (ref >60.00)
Glucose, Bld: 103 mg/dL — ABNORMAL HIGH (ref 70–99)
Phosphorus: 3.6 mg/dL (ref 2.3–4.6)
Potassium: 4.5 meq/L (ref 3.5–5.1)
Sodium: 139 meq/L (ref 135–145)

## 2023-07-13 LAB — URIC ACID: Uric Acid, Serum: 7.6 mg/dL — ABNORMAL HIGH (ref 2.4–7.0)

## 2023-07-22 ENCOUNTER — Encounter: Payer: Self-pay | Admitting: Family Medicine

## 2023-07-22 ENCOUNTER — Ambulatory Visit: Payer: Medicare Other | Admitting: Family Medicine

## 2023-07-22 ENCOUNTER — Ambulatory Visit (INDEPENDENT_AMBULATORY_CARE_PROVIDER_SITE_OTHER): Payer: Medicare Other | Admitting: Family Medicine

## 2023-07-22 VITALS — BP 148/60 | HR 70 | Temp 98.3°F | Ht 61.5 in | Wt 158.1 lb

## 2023-07-22 DIAGNOSIS — G8929 Other chronic pain: Secondary | ICD-10-CM

## 2023-07-22 DIAGNOSIS — M25512 Pain in left shoulder: Secondary | ICD-10-CM

## 2023-07-22 DIAGNOSIS — M109 Gout, unspecified: Secondary | ICD-10-CM | POA: Diagnosis not present

## 2023-07-22 DIAGNOSIS — M25511 Pain in right shoulder: Secondary | ICD-10-CM

## 2023-07-22 DIAGNOSIS — M8580 Other specified disorders of bone density and structure, unspecified site: Secondary | ICD-10-CM | POA: Diagnosis not present

## 2023-07-22 DIAGNOSIS — N1832 Chronic kidney disease, stage 3b: Secondary | ICD-10-CM | POA: Diagnosis not present

## 2023-07-22 DIAGNOSIS — R5382 Chronic fatigue, unspecified: Secondary | ICD-10-CM

## 2023-07-22 DIAGNOSIS — I10 Essential (primary) hypertension: Secondary | ICD-10-CM

## 2023-07-22 MED ORDER — AMLODIPINE BESYLATE 5 MG PO TABS: 5.0000 mg | ORAL_TABLET | Freq: Every day | ORAL | 2 refills | Status: DC

## 2023-07-22 NOTE — Progress Notes (Signed)
 Ph: [phone] Fax: [phone]   Patient ID: Carrie Lucas, female    DOB: 13-Dec-1940, 83 y.o.   MRN: [id number]  This visit was conducted in person.  BP (!) 148/60 (BP Location: Right Arm, Cuff Size: Normal)   Pulse 70   Temp 98.3 F (36.8 C) (Oral)   Ht 5' 1.5" (1.562 m)   Wt 158 lb 2 oz (71.7 kg)   SpO2 97%   BMI 29.39 kg/m   BP Readings from Last 3 Encounters:  07/22/23 (!) 148/60  07/12/23 (!) 156/56  07/06/23 130/70   CC: 6 mo f/u visit  Subjective:   HPI: Carrie Lucas is a 83 y.o. female presenting on 07/22/2023 for Medical Management of Chronic Issues (Here for 6 mo f/u. Pt brought in home BP monitor to compare. Reading in office today- 163/85. States BP has been "all over the place".)   Seen at ER 07/12/2023 for acute cough, records reviewed.  Chest x-ray returned normal.  Diagnosed with viral upper respiratory infection. Ongoing symptoms for 3 weeks, finally overall improved. During this time she had redness and swelling of left lower leg associated with itching - treated by her dermatologist with steroid cream and compression sock with benefit.   Pending nocturnal polysomnography study mid next month through [facility]. Notes marked fatigue and daytime somnolence.   Saw ortho Dr. Madelyn Brunner in January for bilateral shoulder pain and received steroid injection in the right shoulder for chronic right shoulder pain due to osteoarthritis. This helped for ~3 weeks - planning to return to see ortho for f/u. Notes more pain when laying on R side when in bed.   Gout-latest podiatry flare was November 2024.  At that time treated with prednisone and allopurinol was started.   CKD stage 3 due to microvascular disease from HTN - sees Dr Malen Gauze nephrologist at [facility] in [place].   DEXA 06/2021 - T -1.7 LFN with increased fracture risk (4.8% hip, 20.2% major fx). Was given ok by nephrology for Prolia - however insurance wouldn't pay for this.    HTN - Compliant with current antihypertensive regimen of amlodipine 2.5mg  daily, toprol XL 12.5mg  daily, valsartan 320mg  daily. Amlodipine dose dropped 12/2022 due to lightheadedness. Does check blood pressures at home and brings home BP cuff - comparable to our readings. In early February had hypotension with BP down to 70-90s/49-53 of unclear etiology. Denies HA, vision changes, CP/tightness, SOB, leg swelling.      Relevant past medical, surgical, family and social history reviewed and updated as indicated. Interim medical history since our last visit reviewed. Allergies and medications reviewed and updated. Outpatient Medications Prior to Visit  Medication Sig Dispense Refill   acetaminophen (TYLENOL) 650 MG CR tablet Take 1 tablet (650 mg total) by mouth 3 (three) times daily as needed for pain.     allopurinol (ZYLOPRIM) 100 MG tablet Take 1 tablet (100 mg total) by mouth daily. 30 tablet 6   ascorbic acid (VITAMIN C) 500 MG tablet Take 1 tablet (500 mg total) by mouth daily.     aspirin EC 81 MG tablet Take 1 tablet (81 mg total) by mouth every Monday, Wednesday, and Friday.     atorvastatin (LIPITOR) 20 MG tablet Take 1 tablet (20 mg total) by mouth daily. 90 tablet 4   b complex vitamins capsule Take 1 capsule by mouth daily.     Cholecalciferol (VITAMIN D3) 1000 units CAPS Take 1,000 Units by mouth daily.  cyanocobalamin 1000 MCG tablet Take 1 tablet (1,000 mcg total) by mouth every Monday, Wednesday, and Friday.     metoprolol succinate (TOPROL-XL) 25 MG 24 hr tablet Take 0.5 tablets (12.5 mg total) by mouth daily. 45 tablet 4   traMADol (ULTRAM) 50 MG tablet TAKE 1 TABLET BY MOUTH TWICE DAILY AS NEEDED FOR  MODERATE  PAIN 30 tablet 0   valsartan (DIOVAN) 320 MG tablet Take 1 tablet (320 mg total) by mouth daily. 90 tablet 4   amLODipine (NORVASC) 2.5 MG tablet Take 2.5 mg by mouth daily.     No facility-administered medications prior to visit.     Per HPI unless specifically  indicated in ROS section below Review of Systems  Objective:  BP (!) 148/60 (BP Location: Right Arm, Cuff Size: Normal)   Pulse 70   Temp 98.3 F (36.8 C) (Oral)   Ht 5' 1.5" (1.562 m)   Wt 158 lb 2 oz (71.7 kg)   SpO2 97%   BMI 29.39 kg/m   Wt Readings from Last 3 Encounters:  07/22/23 158 lb 2 oz (71.7 kg)  07/12/23 156 lb (70.8 kg)  07/06/23 157 lb (71.2 kg)      Physical Exam Vitals and nursing note reviewed.  Constitutional:      Appearance: Normal appearance. She is not ill-appearing.  HENT:     Mouth/Throat:     Mouth: Mucous membranes are moist.     Pharynx: Oropharynx is clear. No oropharyngeal exudate or posterior oropharyngeal erythema.  Eyes:     Extraocular Movements: Extraocular movements intact.     Pupils: Pupils are equal, round, and reactive to light.  Cardiovascular:     Rate and Rhythm: Normal rate and regular rhythm.     Pulses: Normal pulses.     Heart sounds: Normal heart sounds. No murmur heard. Pulmonary:     Effort: Pulmonary effort is normal. No respiratory distress.     Breath sounds: Normal breath sounds. No wheezing, rhonchi or rales.  Musculoskeletal:     Right lower leg: No edema.     Left lower leg: No edema.  Skin:    General: Skin is warm and dry.     Findings: No rash.  Neurological:     Mental Status: She is alert.  Psychiatric:        Mood and Affect: Mood normal.        Behavior: Behavior normal.       Results for orders placed or performed in visit on 07/13/23  Uric acid   Collection Time: 07/13/23  8:50 AM  Result Value Ref Range   Uric Acid, Serum 7.6 (H) 2.4 - 7.0 mg/dL  Renal function panel   Collection Time: 07/13/23  8:50 AM  Result Value Ref Range   Sodium 139 135 - 145 mEq/L   Potassium 4.5 3.5 - 5.1 mEq/L   Chloride 106 96 - 112 mEq/L   CO2 24 19 - 32 mEq/L   Albumin 4.1 3.5 - 5.2 g/dL   BUN 23 6 - 23 mg/dL   Creatinine, Ser 1.61 (H) 0.40 - 1.20 mg/dL   Glucose, Bld 096 (H) 70 - 99 mg/dL   Phosphorus  3.6 2.3 - 4.6 mg/dL   GFR 04.54 (L) >09.81 mL/min   Calcium 9.6 8.4 - 10.5 mg/dL    Assessment & Plan:   Problem List Items Addressed This Visit     Osteopenia with high risk of fracture   Insurance did not cover prolia injections.  Avoid bisphosphonate in CKD history.  Will consider updating DEXA      HTN (hypertension)   Chronic, BP above goal  - will increase amlodipine dose back to 5mg  daily. Continue low dose toprol XL and full dose valsartan.       Relevant Medications   amLODipine (NORVASC) 5 MG tablet   CKD (chronic kidney disease) stage 3, GFR 30-59 ml/min (HCC) - Primary   Chronic, stable period. Appreciate nephrology care - seeing once yearly Thought hypertensive nephropathy.       Chronic pain of both shoulders   Found to have marked DJD to R shoulder s/p steroid injection 04/2023.  She desires to postpone shoulder replacement at this time She will follow up with ortho for next steps.      Chronic fatigue   Discussed suspect sleep apnea related - she will keep sleep study appt next month through neurology.       Podagra   Doing well on allopurinol 100mg  daily, however urate levels remain above goal. As no recent gout flare, will not titrate allopurinol at this time.         Meds ordered this encounter  Medications   amLODipine (NORVASC) 5 MG tablet    Sig: Take 1 tablet (5 mg total) by mouth daily.    Dispense:  90 tablet    Refill:  2    Note new dose    No orders of the defined types were placed in this encounter.   Patient Instructions  BP staying elevated - increase amlodipine back to 5mg  daily - new dose sent to pharmacy.  Continue monitoring blood pressures at home  Return in 6 months for medicare wellness visit / follow up   Follow up plan: Return in about 6 months (around 01/22/2024) for medicare wellness visit.  Eustaquio Boyden, MD

## 2023-07-22 NOTE — Assessment & Plan Note (Addendum)
 Insurance did not cover prolia injections. Avoid bisphosphonate in CKD history.  Will consider updating DEXA

## 2023-07-22 NOTE — Assessment & Plan Note (Signed)
 Found to have marked DJD to R shoulder s/p steroid injection 04/2023.  She desires to postpone shoulder replacement at this time She will follow up with ortho for next steps.

## 2023-07-22 NOTE — Assessment & Plan Note (Signed)
 Chronic, stable period. Appreciate nephrology care - seeing once yearly Thought hypertensive nephropathy.

## 2023-07-22 NOTE — Assessment & Plan Note (Signed)
 Chronic, BP above goal  - will increase amlodipine dose back to 5mg  daily. Continue low dose toprol XL and full dose valsartan.

## 2023-07-22 NOTE — Patient Instructions (Signed)
 BP staying elevated - increase amlodipine back to 5mg  daily - new dose sent to pharmacy.  Continue monitoring blood pressures at home  Return in 6 months for medicare wellness visit / follow up

## 2023-07-22 NOTE — Assessment & Plan Note (Addendum)
 Discussed suspect sleep apnea related - she will keep sleep study appt next month through neurology.

## 2023-07-22 NOTE — Assessment & Plan Note (Signed)
 Doing well on allopurinol 100mg  daily, however urate levels remain above goal. As no recent gout flare, will not titrate allopurinol at this time.

## 2023-07-28 DIAGNOSIS — D1801 Hemangioma of skin and subcutaneous tissue: Secondary | ICD-10-CM | POA: Diagnosis not present

## 2023-07-28 DIAGNOSIS — I8312 Varicose veins of left lower extremity with inflammation: Secondary | ICD-10-CM | POA: Diagnosis not present

## 2023-07-28 DIAGNOSIS — I8311 Varicose veins of right lower extremity with inflammation: Secondary | ICD-10-CM | POA: Diagnosis not present

## 2023-07-28 DIAGNOSIS — L814 Other melanin hyperpigmentation: Secondary | ICD-10-CM | POA: Diagnosis not present

## 2023-07-28 DIAGNOSIS — L308 Other specified dermatitis: Secondary | ICD-10-CM | POA: Diagnosis not present

## 2023-07-28 DIAGNOSIS — I872 Venous insufficiency (chronic) (peripheral): Secondary | ICD-10-CM | POA: Diagnosis not present

## 2023-07-28 DIAGNOSIS — D225 Melanocytic nevi of trunk: Secondary | ICD-10-CM | POA: Diagnosis not present

## 2023-07-28 DIAGNOSIS — Z85828 Personal history of other malignant neoplasm of skin: Secondary | ICD-10-CM | POA: Diagnosis not present

## 2023-07-28 DIAGNOSIS — L821 Other seborrheic keratosis: Secondary | ICD-10-CM | POA: Diagnosis not present

## 2023-08-01 ENCOUNTER — Encounter: Payer: Self-pay | Admitting: Family Medicine

## 2023-08-15 ENCOUNTER — Ambulatory Visit (INDEPENDENT_AMBULATORY_CARE_PROVIDER_SITE_OTHER): Admitting: Neurology

## 2023-08-15 DIAGNOSIS — G4720 Circadian rhythm sleep disorder, unspecified type: Secondary | ICD-10-CM

## 2023-08-15 DIAGNOSIS — G4733 Obstructive sleep apnea (adult) (pediatric): Secondary | ICD-10-CM

## 2023-08-15 DIAGNOSIS — Z9189 Other specified personal risk factors, not elsewhere classified: Secondary | ICD-10-CM

## 2023-08-15 DIAGNOSIS — G4719 Other hypersomnia: Secondary | ICD-10-CM

## 2023-08-15 DIAGNOSIS — R519 Headache, unspecified: Secondary | ICD-10-CM

## 2023-08-15 DIAGNOSIS — G4734 Idiopathic sleep related nonobstructive alveolar hypoventilation: Secondary | ICD-10-CM

## 2023-08-15 DIAGNOSIS — G4700 Insomnia, unspecified: Secondary | ICD-10-CM

## 2023-08-15 DIAGNOSIS — R0683 Snoring: Secondary | ICD-10-CM

## 2023-08-19 MED ORDER — FUROSEMIDE 20 MG PO TABS: 10.0000 mg | ORAL_TABLET | Freq: Every day | ORAL | 1 refills | Status: DC | PRN

## 2023-08-19 NOTE — Addendum Note (Signed)
 Addended by: Claire Crick on: 08/19/2023 05:24 PM   Modules accepted: Orders

## 2023-08-23 ENCOUNTER — Telehealth: Payer: Self-pay | Admitting: Family Medicine

## 2023-08-23 NOTE — Telephone Encounter (Signed)
 Per 08/01/23 pt msg, Dr Crissie Dome wanted pt to rtn in 1-2 weeks to review swelling. Offered 08/29/23 at 2:00. Pt accepted. Will have pt added to schedule.   Plz add pt to Dr Ocie Belt schedule on 08/29/23 at 2:00.

## 2023-08-23 NOTE — Telephone Encounter (Signed)
 Copied from CRM [id number]. Topic: Appointments - Scheduling Inquiry for Clinic >> Aug 23, 2023  8:39 AM Rosamond Comes wrote: Reason for CRM: Patient calling in to schedule follow up appt per MyChart Patient Messaging 1 to 2 weeks Dr Mariam Shingles first appt is in June  Please call patient to schedule  [phone] ok to leave detailed message.

## 2023-08-23 NOTE — Procedures (Signed)
 Physician Interpretation:     [facility] POLYSOMNOGRAPHY  INTERPRETATION REPORT   STUDY DATE:  08/15/2023     PATIENT NAME:  Carrie Lucas         DATE OF BIRTH:  15-May-1940  PATIENT ID:  [id number]    TYPE OF STUDY:  PSG  READING PHYSICIAN: Debbra Fairy, MD, PhD   SCORING TECHNICIAN: Auston Left, RPSGT   Referred by: Claire Crick, MD  ? History and Indication for Testing: 83 year old female with an underlying medical history of arthritis, basal cell carcinoma, carotid artery stenosis, chronic renal disease, degenerative lumbar disc disease, hypertension, hyperlipidemia, irritable bowel syndrome, and overweight state, who reports snoring and chronic difficulty initiating and maintaining sleep for years. Her Epworth sleepiness score is 5 out of 24, fatigue severity score is 37 out of 63. Height: 61 in Weight: 156 lb (BMI 29) Neck Size: 15 in    MEDICATIONS: Tylenol , Norvasc , Aspirin , Lipitor, Vitamin D3, Cyanocobalamin , Toprol  - XL, Diovan , Zyloprim , Vitamin C, B Complex Vitamins, Ultram    TECHNICAL DESCRIPTION: A registered sleep technologist was in attendance for the duration of the recording.  Data collection, scoring, video monitoring, and reporting were performed in compliance with the AASM Manual for the Scoring of Sleep and Associated Events; (Hypopnea is scored based on the criteria listed in Section VIII D. 1b in the AASM Manual V2.6 using a 4% oxygen desaturation rule or Hypopnea is scored based on the criteria listed in Section VIII D. 1a in the AASM Manual V2.6 using 3% oxygen desaturation and /or arousal rule).   SLEEP CONTINUITY AND SLEEP ARCHITECTURE:  Lights-out was at 22:05: and lights-on at  05:07:, with a total recording time of 7 hours, 2.5 min. Total sleep time ( TST) was 350.5 minutes with a normal sleep efficiency at 83.0%. There was  9.3% REM sleep.  BODY POSITION:  TST was divided  between the following sleep positions: 15.7%  supine;  84.3% lateral;  0% prone. Duration of total sleep and percent of total sleep in their respective position is as follows: supine 55 minutes (16%), non-supine 296 minutes (84%); right 186 minutes (53%), left 109 minutes (31%), and prone 00 minutes (0%).  Total supine REM sleep time was 00 minutes (0% of total REM sleep).  Sleep latency was increased at 32.5 minutes.  REM sleep latency was markedly increased at 245.0 minutes. Of the total sleep time, the percentage of stage N1 sleep was 5.6%, stage N2 sleep was 79%, which is markedly increased, stage N3 sleep was 6.6%, which is reduced, and REM sleep was 9.3%, which is reduced. Wake after sleep onset (WASO) time accounted for 39.5 minutes with minimal to moderate sleep fragmentation noted.   RESPIRATORY MONITORING:   Based on CMS criteria (using a 4% oxygen desaturation rule for scoring hypopneas), there were 6 apneas (6 obstructive; 0 central; 0 mixed), and 28 hypopneas.  Apnea index was 1.0. Hypopnea index was 4.8. The apnea-hypopnea index was 5.8/hour overall (7.6 supine, 37 non-supine; 36.9 REM, 0.0 supine REM).  There were 0 respiratory effort-related arousals (RERAs).  The RERA index was 0 events/h. Total respiratory disturbance index (RDI) was 5.8 events/h. RDI results showed: supine RDI  7.6 /h; non-supine RDI 5.5 /h; REM RDI 36.9 /h, supine REM RDI 0.0 /h.   Based on AASM criteria (using a 3% oxygen desaturation and /or arousal rule for scoring hypopneas), there were 6 apneas (6 obstructive; 0 central; 0 mixed), and 63 hypopneas. Apnea index was 1.0. Hypopnea  index was 10.8. The apnea-hypopnea index was 11.8 overall (26.2 supine, 37 non-supine; 36.9 REM, 0.0 supine REM).  There were 0 respiratory effort-related arousals (RERAs).  The RERA index was 0 events/h. Total respiratory disturbance index (RDI) was 11.8 events/h. RDI results showed: supine RDI  26.2 /h; non-supine RDI 9.1 /h; REM RDI 36.9 /h, supine REM RDI 0.0 /h.    OXIMETRY:  Oxyhemoglobin Saturation Nadir during sleep was at  80% from a mean of 91%.  Of the Total sleep time (TST)   hypoxemia (=<88%) was present for  31.2 minutes, or 8.9% of total sleep time.  LIMB MOVEMENTS: There were 0 periodic limb movements of sleep (0.0/hr), of which 0 (0.0/hr) were associated with an arousal.   AROUSAL: There were 129 arousals in total, for an arousal index of 22 arousals/hour.  Of these, 22 were identified as respiratory-related arousals (4 /h), 0 were PLM-related arousals (0 /h), and 119 were non-specific arousals (20 /h).   EEG: Review of the EEG showed no abnormal electrical discharges and symmetrical bihemispheric findings.    EKG: The EKG revealed normal sinus rhythm (NSR). The average heart rate during sleep was 63 bpm.   AUDIO/VIDEO REVIEW: The audio and video review did not show any abnormal or unusual behaviors, movements, phonations or vocalizations. The patient took one restroom break. Snoring was noted, intermittently, in the mild range  POST-STUDY QUESTIONNAIRE: Post study, the patient indicated, that sleep was the same as usual.   IMPRESSION:   1. Obstructive Sleep Apnea (OSA) 2. Dysfunctions associated with sleep stages or arousal from sleep 3. Nocturnal hypoxemia  RECOMMENDATIONS:   1. This study demonstrates overall mild obstructive sleep apnea, more pronounced during REM sleep with a total AHI of 5.8/h, REM AHI of 36.9/hour, supine AHI of 7.6/hour and O2 nadir of 80% (during nonsupine REM sleep). Time below 89% saturation was over 30 minutes, indicating nocturnal hypoxemia. Given the patient's medical history and sleep related complaints, as well as significant REM related desaturations, treatment with positive airway pressure is recommended; this can be achieved in the form of autoPAP.  I recommend home AutoPap therapy with a setting of 5 to 11 cm, mask of choice, sized to fit.  A full-night CPAP titration study would allow optimization of therapy if  needed, down the road. Other treatment options may include avoidance of supine sleep position along with weight loss (where clinically feasible and applicable), or the use of an oral appliance in selected patients.  Generally speaking, surgical treatment options are not recommended for mild obstructive sleep apnea. Please note, that untreated obstructive sleep apnea may carry additional perioperative morbidity. Patients with significant obstructive sleep apnea should receive perioperative PAP therapy and the surgeons and particularly the anesthesiologist should be informed of the diagnosis and the severity of the sleep disordered breathing. 2. This study shows variable sleep fragmentation and abnormal sleep stage percentages; these are nonspecific findings and per se do not signify an intrinsic sleep disorder or a cause for the patient's sleep-related symptoms. Causes include (but are not limited to) the first night effect of the sleep study, circadian rhythm disturbances, medication effect or an underlying mood disorder or medical problem.  3. The patient should be cautioned not to drive, work at heights, or operate dangerous or heavy equipment when tired or sleepy. Review and reiteration of good sleep hygiene measures should be pursued with any patient. 4. The patient will be seen in follow-up in the Sleep Clinic at [facility] for discussion of the test  results, progress with treatment, and recheck on symptoms as well as potential further management strategies. The referring provider and the patient will be notified of the test results.    I certify that I have reviewed the entire raw data recording prior to the issuance of this report in accordance with the Standards of Accreditation of the American Academy of Sleep Medicine (AASM).  Debbra Fairy, MD, PhD Medical Director, [facility]) Diplomat, ABPN (Neurology and Sleep)              Technical Report:    General Information  Name: Analys, [place] BMI: 82.95 Physician: Debbra Fairy, MD  ID: [id number] Height: 61.0 in Technician: Auston Left, RPSGT  Sex: Female Weight: 156.0 lb Record: [id number]  Age: 79 [1941/03/24] Date: 08/15/2023    Medical & Medication History    Follow-up visit: Prior visit: 02/22/2022 with Dr. Omar Bibber (initial consult visit) Brief HPI: Carrie Lucas is a 83 y.o. female who was evaluated by Dr. Omar Bibber in 01/2022 for concern of underlying sleep apnea and chronic difficulty initiating and maintaining sleep with reports of snoring and nonrestorative sleep. It was recommended proceeding with a sleep study but was never pursued. Interval history: She returns today as she is now interested in pursuing sleep study. It was required by insurance that she is seen again in office prior to proceeding as it has been over a year since her prior visit. She continues to struggle with initiating and maintaining sleep, excessive daytime sleepiness, snoring and frequent dull headaches. Reports PCP has completed multiple labs to look for causes of fatigue which have all been benign.  Tylenol , Norvasc , Aspirin , Lipitor, Vitamin D3, Cyanocobalamin , Toprol  - XL, Diovan , Zyloprim , Vitamin C, B Complex Vitamins, Ultram    Sleep Disorder      Comments   The patient came into the sleep lab for a PSG. The patient took Benadryl prior to sleep study. One restroom break. EKG kept in NSR with occasional PVC's. Mild snoring. All sleep stages witnessed. Respiratory events scored with a 4% desat. Respiratory events were mostly in REM and while in the supine position. The patient slept supine and lateral. AHI was 3.5 after 2 hrs of TST.     Lights out: 10:05:04 PM Lights on: 05:07:32 AM   Time Total Supine Side Prone Upright  Recording (TRT) 7h 2.4m 1h 22.63m 5h 40.72m 0h 0.80m 0h 0.62m  Sleep (TST) 5h 50.59m 0h 55.76m 4h 55.22m 0h 0.38m 0h 0.12m   Latency N1 N2 N3 REM Onset Per. Slp. Eff.  Actual 0h 0.39m 0h 1.61m 2h  21.33m 4h 5.36m 0h 32.56m 0h 52.32m 82.96%   Stg Dur Wake N1 N2 N3 REM  Total 72.0 19.5 275.5 23.0 32.5  Supine 27.5 2.5 51.0 1.5 0.0  Side 44.5 17.0 224.5 21.5 32.5  Prone 0.0 0.0 0.0 0.0 0.0  Upright 0.0 0.0 0.0 0.0 0.0   Stg % Wake N1 N2 N3 REM  Total 17.0 5.6 78.6 6.6 9.3  Supine 6.5 0.7 14.6 0.4 0.0  Side 10.5 4.9 64.1 6.1 9.3  Prone 0.0 0.0 0.0 0.0 0.0  Upright 0.0 0.0 0.0 0.0 0.0     Apnea Summary Sub Supine Side Prone Upright  Total 6 Total 6 0 6 0 0    REM 5 0 5 0 0    NREM 1 0 1 0 0  Obs 6 REM 5 0 5 0 0    NREM 1 0 1 0 0  Mix 0  REM 0 0 0 0 0    NREM 0 0 0 0 0  Cen 0 REM 0 0 0 0 0    NREM 0 0 0 0 0   Rera Summary Sub Supine Side Prone Upright  Total 0 Total 0 0 0 0 0    REM 0 0 0 0 0    NREM 0 0 0 0 0   Hypopnea Summary Sub Supine Side Prone Upright  Total 63 Total 63 24 39 0 0    REM 15 0 15 0 0    NREM 48 24 24 0 0   4% Hypopnea Summary Sub Supine Side Prone Upright  Total (4%) 28 Total 28 7 21  0 0    REM 15 0 15 0 0    NREM 13 7 6  0 0     AHI Total Obs Mix Cen  11.81 Apnea 1.03 1.03 0.00 0.00   Hypopnea 10.78 -- -- --  5.82 Hypopnea (4%) 4.79 -- -- --    Total Supine Side Prone Upright  Position AHI 11.81 26.18 9.14 0.00 0.00  REM AHI 36.92   NREM AHI 9.25   Position RDI 11.81 26.18 9.14 0.00 0.00  REM RDI 36.92   NREM RDI 9.25    4% Hypopnea Total Supine Side Prone Upright  Position AHI (4%) 5.82 7.64 5.48 0.00 0.00  REM AHI (4%) 36.92   NREM AHI (4%) 2.64   Position RDI (4%) 5.82 7.64 5.48 0.00 0.00  REM RDI (4%) 36.92   NREM RDI (4%) 2.64    Desaturation Information Threshold: 2% <100% <90% <80% <70% <60% <50% <40%  Supine 52.0 46.0 0.0 0.0 0.0 0.0 0.0  Side 151.0 45.0 0.0 0.0 0.0 0.0 0.0  Prone 0.0 0.0 0.0 0.0 0.0 0.0 0.0  Upright 0.0 0.0 0.0 0.0 0.0 0.0 0.0  Total 203.0 91.0 0.0 0.0 0.0 0.0 0.0  Index 31.2 14.0 0.0 0.0 0.0 0.0 0.0   Threshold: 3% <100% <90% <80% <70% <60% <50% <40%  Supine 25.0 22.0 0.0 0.0 0.0 0.0 0.0  Side 59.0  35.0 0.0 0.0 0.0 0.0 0.0  Prone 0.0 0.0 0.0 0.0 0.0 0.0 0.0  Upright 0.0 0.0 0.0 0.0 0.0 0.0 0.0  Total 84.0 57.0 0.0 0.0 0.0 0.0 0.0  Index 12.9 8.8 0.0 0.0 0.0 0.0 0.0   Threshold: 4% <100% <90% <80% <70% <60% <50% <40%  Supine 8.0 6.0 0.0 0.0 0.0 0.0 0.0  Side 29.0 25.0 0.0 0.0 0.0 0.0 0.0  Prone 0.0 0.0 0.0 0.0 0.0 0.0 0.0  Upright 0.0 0.0 0.0 0.0 0.0 0.0 0.0  Total 37.0 31.0 0.0 0.0 0.0 0.0 0.0  Index 5.7 4.8 0.0 0.0 0.0 0.0 0.0   Threshold: 3% <100% <90% <80% <70% <60% <50% <40%  Supine 25 22 0 0 0 0 0  Side 59 35 0 0 0 0 0  Prone 0 0 0 0 0 0 0  Upright 0 0 0 0 0 0 0  Total 84 57 0 0 0 0 0   Awakening/Arousal Information # of Awakenings 27  Wake after sleep onset 39.61m  Wake after persistent sleep 35.34m   Arousal Assoc. Arousals Index  Apneas 4 0.7  Hypopneas 18 3.1  Leg Movements 0 0.0  Snore 0 0.0  PTT Arousals 0 0.0  Spontaneous 119 20.4  Total 141 24.1  Leg Movement Information PLMS LMs Index  Total LMs during PLMS 0 0.0  LMs w/ Microarousals 0 0.0  LM LMs Index  w/ Microarousal 0 0.0  w/ Awakening 0 0.0  w/ Resp Event 0 0.0  Spontaneous 2 0.3  Total 2 0.3     Desaturation threshold setting: 3% Minimum desaturation setting: 10 seconds SaO2 nadir: 80% The longest event was a 95 sec obstructive Hypopnea with a minimum SaO2 of 86%. The lowest SaO2 was 80% associated with a 43 sec obstructive Apnea. EKG Rates EKG Avg Max Min  Awake 68 114 57  Asleep 63 76 51  EKG Events: Tachycardia

## 2023-08-24 ENCOUNTER — Other Ambulatory Visit: Payer: Self-pay | Admitting: Adult Health

## 2023-08-24 ENCOUNTER — Telehealth: Payer: Self-pay | Admitting: Neurology

## 2023-08-24 DIAGNOSIS — G4733 Obstructive sleep apnea (adult) (pediatric): Secondary | ICD-10-CM

## 2023-08-24 NOTE — Telephone Encounter (Signed)
-----   Message from Johny Nap sent at 08/24/2023  9:43 AM EDT ----- Please advise patient of results of recent sleep study showing overall mild OSA although more pronounced during REM sleep and also evidence of O2 levels below 89% of over 30 minutes, indicating nocturnal hypoxemia.  Dr. Omar Bibber recommends initiating AutoPap.  Order will be placed.  Thank you.

## 2023-08-24 NOTE — Telephone Encounter (Signed)
 I called pt. I advised pt that Johny Nap, NP reviewed their sleep study results and found that pt has overall mild sleep apnea. Johny Nap. NP recommends that pt start auto CPAP. I reviewed PAP compliance expectations with the pt. Pt is agreeable to starting a CPAP. I advised pt that an order will be sent to a DME, [facility], and [facility] will call the pt within about one week after they file with the pt's insurance. [facility] will show the pt how to use the machine, fit for masks, and troubleshoot the CPAP if needed. A follow up appt will need to be made for insurance purposes with Camilo Cella NP or Sleep MD  within 31-90 days from date she gets the machine. Pt will call back to schedule once she has an appt to pick up the machine. Pt verbalized understanding to arrive 15 minutes early and bring their CPAP. Pt verbalized understanding of results. Pt had no questions at this time but was encouraged to call back if questions arise. I have sent the order to [facility] and have received confirmation that they have received the order. Pt asked a copy was sent to pcp so she can also further discuss at her upcoming apt on Monday.

## 2023-08-29 ENCOUNTER — Encounter: Payer: Self-pay | Admitting: Family Medicine

## 2023-08-29 ENCOUNTER — Ambulatory Visit (INDEPENDENT_AMBULATORY_CARE_PROVIDER_SITE_OTHER): Admitting: Family Medicine

## 2023-08-29 VITALS — BP 134/60 | HR 70 | Temp 98.1°F | Ht 61.5 in | Wt 156.0 lb

## 2023-08-29 DIAGNOSIS — I10 Essential (primary) hypertension: Secondary | ICD-10-CM | POA: Diagnosis not present

## 2023-08-29 DIAGNOSIS — I872 Venous insufficiency (chronic) (peripheral): Secondary | ICD-10-CM | POA: Insufficient documentation

## 2023-08-29 DIAGNOSIS — M7989 Other specified soft tissue disorders: Secondary | ICD-10-CM | POA: Insufficient documentation

## 2023-08-29 DIAGNOSIS — N1832 Chronic kidney disease, stage 3b: Secondary | ICD-10-CM

## 2023-08-29 NOTE — Assessment & Plan Note (Signed)
 Wide fluctuations at home including low readings to 100s especially in am.  Will drop night time amlodipine  dose to 2.5mg  nightly.  Continue valsartan  320mg  and toprol  XL 12.5mg  in am. Increase lasix  dose as per below.

## 2023-08-29 NOTE — Patient Instructions (Addendum)
 Labs today. Pending results we may get venous ultrasound to rule out blood lot.  Increase lasix  to 2 tablets at a time as needed for leg swelling.  We will be in touch with results.  Elevate leg, continue compression stocking, limit salt/sodium in the diet, ensure good water intake.  Drop amlodipine  to 2.5mg  nightly.

## 2023-08-29 NOTE — Assessment & Plan Note (Addendum)
 LLE swelling associated with dermatitis, present for the past ~2 months. Not consistent with cellulitis.  Given unilateral, discussed need to r/o DVT - check D-dimer and if elevated will proceed with venous US . No obvious obstruction, inguinal LAD, abd pain.  Discussed other causes of (usually bilateral) LE edema.  Update TSH and renal panel as per above   ADDENDUM ===> D dimer elevated at 0.95. will order venous US  r/o DVT.

## 2023-08-29 NOTE — Assessment & Plan Note (Signed)
 H/o this has been managing with compression stockings

## 2023-08-29 NOTE — Assessment & Plan Note (Signed)
 Update renal panel with more regular lasix  use.

## 2023-08-29 NOTE — Progress Notes (Signed)
 Ph: [phone] Fax: [phone]   Patient ID: Carrie Lucas, female    DOB: 1941-04-16, 83 y.o.   MRN: [id number]  This visit was conducted in person.  BP 134/60   Pulse 70   Temp 98.1 F (36.7 C) (Oral)   Ht 5' 1.5" (1.562 m)   Wt 156 lb (70.8 kg)   SpO2 95%   BMI 29.00 kg/m    CC: leg swelling with rash  Subjective:   HPI: Carrie Lucas is a 83 y.o. female presenting on 08/29/2023 for Medical Management of Chronic Issues (L leg swelling that's brought on a rash; x43mo; no pain but itching badly; may be bruise from scratching; got compression socks and been wearing for about 2wks but no relief; taking dermatologist prescribed cream( (betamethasone)been through two tubes); )   Months of L>R leg swelling with subsequent development of leg dermatitis. Very itchy rash - she was taking beandryl oral for this. Saw dermatologist Dr Theron Flavin - treated with betamethasone steroid cream - has gone through 2 tubes without relief. Regular use of knee high grade 2 (20-48mmHg) compression stockings haven't helped. Itch may be better. Rash has never gone away.  Actually notes that in the past week itching has significantly improved  No long car rides or plane rides, not on HRT, no calf swelling or pain behind the knee.  Occ tingling. No numbness, weakness or burning pain.  No chest pain or dyspnea.   CKD with hypertension - managed with amlodipine  5mg  nightly, Toprol  XL 12.5mg  daily, valsartan  320mg  daily, lasix  20mg  1/2-1 daily PRN (taking a whole tablet about 5x/wk - doesn't note significant UOP on this dose).   Home BP readings widely fluctuating - 100/50 to 140s/70s.  She does check in a resting state.  She feels bad when blood pressures drop.      Relevant past medical, surgical, family and social history reviewed and updated as indicated. Interim medical history since our last visit reviewed. Allergies and medications reviewed and updated. Outpatient Medications Prior to Visit   Medication Sig Dispense Refill   acetaminophen  (TYLENOL ) 650 MG CR tablet Take 1 tablet (650 mg total) by mouth 3 (three) times daily as needed for pain.     allopurinol  (ZYLOPRIM ) 100 MG tablet Take 1 tablet (100 mg total) by mouth daily. 30 tablet 6   ascorbic acid  (VITAMIN C) 500 MG tablet Take 1 tablet (500 mg total) by mouth daily.     aspirin  EC 81 MG tablet Take 1 tablet (81 mg total) by mouth every Monday, Wednesday, and Friday.     atorvastatin  (LIPITOR) 20 MG tablet Take 1 tablet (20 mg total) by mouth daily. 90 tablet 4   augmented betamethasone dipropionate (DIPROLENE-AF) 0.05 % cream Apply 1 Application topically 2 (two) times daily.     b complex vitamins capsule Take 1 capsule by mouth daily.     Cholecalciferol (VITAMIN D3) 1000 units CAPS Take 1,000 Units by mouth daily.     cyanocobalamin  1000 MCG tablet Take 1 tablet (1,000 mcg total) by mouth every Monday, Wednesday, and Friday.     furosemide  (LASIX ) 20 MG tablet Take 0.5-1 tablets (10-20 mg total) by mouth daily as needed for fluid or edema. 30 tablet 1   metoprolol  succinate (TOPROL -XL) 25 MG 24 hr tablet Take 0.5 tablets (12.5 mg total) by mouth daily. 45 tablet 4   traMADol  (ULTRAM ) 50 MG tablet TAKE 1 TABLET BY MOUTH TWICE DAILY AS NEEDED FOR  MODERATE  PAIN  30 tablet 0   valsartan  (DIOVAN ) 320 MG tablet Take 1 tablet (320 mg total) by mouth daily. 90 tablet 4   amLODipine  (NORVASC ) 5 MG tablet Take 1 tablet (5 mg total) by mouth daily. 90 tablet 2   amLODipine  (NORVASC ) 5 MG tablet Take 0.5 tablets (2.5 mg total) by mouth daily.     No facility-administered medications prior to visit.     Per HPI unless specifically indicated in ROS section below Review of Systems  Objective:  BP 134/60   Pulse 70   Temp 98.1 F (36.7 C) (Oral)   Ht 5' 1.5" (1.562 m)   Wt 156 lb (70.8 kg)   SpO2 95%   BMI 29.00 kg/m   Wt Readings from Last 3 Encounters:  08/29/23 156 lb (70.8 kg)  07/22/23 158 lb 2 oz (71.7 kg)   07/12/23 156 lb (70.8 kg)      Physical Exam Vitals and nursing note reviewed.  Constitutional:      Appearance: Normal appearance. She is not ill-appearing.  Cardiovascular:     Rate and Rhythm: Normal rate and regular rhythm.     Pulses: Normal pulses.     Heart sounds: Normal heart sounds. No murmur heard. Pulmonary:     Effort: Pulmonary effort is normal. No respiratory distress.     Breath sounds: Normal breath sounds. No wheezing, rhonchi or rales.  Musculoskeletal:        General: No swelling.     Right lower leg: No edema.     Left lower leg: No edema.     Comments:  No significant pedal edema appreciated today  2+ DP bilaterally   Lymphadenopathy:     Lower Body: No right inguinal adenopathy. No left inguinal adenopathy.  Skin:    General: Skin is warm and dry.     Findings: Erythema and rash present.     Comments: Faintly erythematous rash to entire LLE from ankle to knee with mild skin lichenification  Neurological:     Mental Status: She is alert.  Psychiatric:        Mood and Affect: Mood normal.        Behavior: Behavior normal.       Results for orders placed or performed in visit on 08/29/23  D-dimer, quantitative   Collection Time: 08/29/23  2:34 PM  Result Value Ref Range   D-Dimer, Quant 0.95 (H) <0.50 mcg/mL FEU   Lab Results  Component Value Date   TSH 1.44 01/19/2022   Assessment & Plan:   Problem List Items Addressed This Visit     HTN (hypertension)   Wide fluctuations at home including low readings to 100s especially in am.  Will drop night time amlodipine  dose to 2.5mg  nightly.  Continue valsartan  320mg  and toprol  XL 12.5mg  in am. Increase lasix  dose as per below.       Relevant Medications   amLODipine  (NORVASC ) 5 MG tablet   Other Relevant Orders   TSH   CKD (chronic kidney disease) stage 3, GFR 30-59 ml/min (HCC)   Update renal panel with more regular lasix  use.       Relevant Orders   Renal function panel   Chronic  venous insufficiency of lower extremity   H/o this has been managing with compression stockings      Relevant Medications   amLODipine  (NORVASC ) 5 MG tablet   Left leg swelling - Primary   LLE swelling associated with dermatitis, present for the past ~2 months. Not  consistent with cellulitis.  Given unilateral, discussed need to r/o DVT - check D-dimer and if elevated will proceed with venous US . No obvious obstruction, inguinal LAD, abd pain.  Discussed other causes of (usually bilateral) LE edema.  Update TSH and renal panel as per above   ADDENDUM ===> D dimer elevated at 0.95. will order venous US  r/o DVT.       Relevant Orders   TSH   D-dimer, quantitative (Completed)   US  Venous Img Lower Unilateral Left (DVT)   VAS US  LOWER EXTREMITY VENOUS (DVT)   Stasis dermatitis   Presumed due to this.  Clobetasol topical steroid has been partially effective.  Compression stockings seem to be helping - continue. Recommend limiting salt/sodium, good hydration status, leg elevation, continue compression.        No orders of the defined types were placed in this encounter.   Orders Placed This Encounter  Procedures   US  Venous Img Lower Unilateral Left (DVT)    INS: MCR/AARP  ID: [id number]  EPIC ORDER WT: 156 NO NEEDS NO MEDICAL DEVICES  PT AWARE OF $75 NO SHOW FEE PT AWARE TO BRING PHOTO ID & INS CARD [facility] W/ PT/DT    Standing Status:   Future    Expiration Date:   08/29/2024    Reason for Exam (SYMPTOM  OR DIAGNOSIS REQUIRED):   left leg swelling, elevated D dimer/rule out DVT per after summary visit    Preferred imaging location?:   [facility]   TSH   D-dimer, quantitative   Renal function panel    Patient Instructions  Labs today. Pending results we may get venous ultrasound to rule out blood lot.  Increase lasix  to 2 tablets at a time as needed for leg swelling.  We will be in touch with results.  Elevate leg, continue compression stocking, limit  salt/sodium in the diet, ensure good water intake.  Drop amlodipine  to 2.5mg  nightly.   Follow up plan: Return if symptoms worsen or fail to improve.  Claire Crick, MD

## 2023-08-29 NOTE — Assessment & Plan Note (Signed)
 Presumed due to this.  Clobetasol topical steroid has been partially effective.  Compression stockings seem to be helping - continue. Recommend limiting salt/sodium, good hydration status, leg elevation, continue compression.

## 2023-08-30 ENCOUNTER — Telehealth: Payer: Self-pay

## 2023-08-30 ENCOUNTER — Ambulatory Visit (HOSPITAL_COMMUNITY)
Admission: RE | Admit: 2023-08-30 | Discharge: 2023-08-30 | Disposition: A | Discharge status: Home / Self Care | Source: Ambulatory Visit | Attending: Family Medicine | Admitting: Family Medicine

## 2023-08-30 ENCOUNTER — Encounter: Payer: Self-pay | Admitting: Family Medicine

## 2023-08-30 DIAGNOSIS — M7989 Other specified soft tissue disorders: Secondary | ICD-10-CM | POA: Insufficient documentation

## 2023-08-30 DIAGNOSIS — N1832 Chronic kidney disease, stage 3b: Secondary | ICD-10-CM

## 2023-08-30 LAB — RENAL FUNCTION PANEL
Albumin: 4.5 g/dL (ref 3.5–5.2)
BUN: 28 mg/dL — ABNORMAL HIGH (ref 6–23)
CO2: 22 meq/L (ref 19–32)
Calcium: 9.4 mg/dL (ref 8.4–10.5)
Chloride: 107 meq/L (ref 96–112)
Creatinine, Ser: 1.75 mg/dL — ABNORMAL HIGH (ref 0.40–1.20)
GFR: 26.68 mL/min — ABNORMAL LOW (ref >60.00)
Glucose, Bld: 81 mg/dL (ref 70–99)
Phosphorus: 4.7 mg/dL — ABNORMAL HIGH (ref 2.3–4.6)
Potassium: 5.1 meq/L (ref 3.5–5.1)
Sodium: 140 meq/L (ref 135–145)

## 2023-08-30 LAB — D-DIMER, QUANTITATIVE: D-Dimer, Quant: 0.95 ug{FEU}/mL — ABNORMAL HIGH (ref <0.50)

## 2023-08-30 LAB — TSH: TSH: 2.67 u[IU]/mL (ref 0.35–5.50)

## 2023-08-30 NOTE — Telephone Encounter (Signed)
 Carrie Lucas

## 2023-08-30 NOTE — Telephone Encounter (Signed)
 Spoke with patient and reviewed information. Verified patients understanding. Patient will call office with any further questions. See lab results note for documentation.

## 2023-08-30 NOTE — Telephone Encounter (Signed)
 Noted. See result note. I have ordered stat LLE venous US  r/o DVT.

## 2023-08-30 NOTE — Telephone Encounter (Signed)
 Received call from Carrie Lucas in vascular lab concerning pt's vascular US  of left lower leg.  Pt is negative for DVT on left leg.  Received phone call at 1:03p.  Spoke with Dr. Mariam Shingles at 1:06p.

## 2023-08-30 NOTE — Telephone Encounter (Addendum)
 Noted. Final report still pending.  Plz notify patient - venous US  reassuringly returned negative for blood clot in left leg.  See lab result section for further info.

## 2023-08-30 NOTE — Addendum Note (Signed)
 Addended by: Claire Crick on: 08/30/2023 07:24 AM   Modules accepted: Orders

## 2023-08-30 NOTE — Addendum Note (Signed)
 Addended by: Claire Crick on: 08/30/2023 02:08 PM   Modules accepted: Orders

## 2023-08-30 NOTE — Telephone Encounter (Signed)
 See 08/30/23 phone note; Dr Crissie Dome is aware and pt is getting further testing.

## 2023-08-30 NOTE — Telephone Encounter (Signed)
 Pt getting imaging done now

## 2023-08-30 NOTE — Addendum Note (Signed)
 Addended by: Claire Crick on: 08/30/2023 09:19 AM   Modules accepted: Orders

## 2023-08-30 NOTE — Telephone Encounter (Signed)
 Per access nurse note  call report read to ocp. Sending to Dr Guido Leeks pool and will teams [facility].

## 2023-08-31 ENCOUNTER — Encounter: Payer: Self-pay | Admitting: Family Medicine

## 2023-09-02 ENCOUNTER — Other Ambulatory Visit

## 2023-09-07 ENCOUNTER — Encounter: Payer: Self-pay | Admitting: Family Medicine

## 2023-09-07 NOTE — Telephone Encounter (Signed)
 Spoke with pt about sxs. States the burning sensation in lower back radiates down into buttocks. Pt is aware Dr Crissie Dome is out of office. Scheduled OV with Dr Malissa Se tomorrow at 9:30.

## 2023-09-08 ENCOUNTER — Ambulatory Visit: Admitting: Family Medicine

## 2023-09-08 ENCOUNTER — Ambulatory Visit: Payer: Self-pay | Admitting: Family Medicine

## 2023-09-08 ENCOUNTER — Ambulatory Visit (INDEPENDENT_AMBULATORY_CARE_PROVIDER_SITE_OTHER)
Admission: RE | Admit: 2023-09-08 | Discharge: 2023-09-08 | Disposition: A | Discharge status: Home / Self Care | Source: Ambulatory Visit | Attending: Family Medicine | Admitting: Family Medicine

## 2023-09-08 ENCOUNTER — Encounter: Payer: Self-pay | Admitting: Family Medicine

## 2023-09-08 VITALS — BP 132/64 | HR 65 | Temp 98.0°F | Ht 61.5 in | Wt 154.2 lb

## 2023-09-08 DIAGNOSIS — R202 Paresthesia of skin: Secondary | ICD-10-CM | POA: Diagnosis not present

## 2023-09-08 DIAGNOSIS — M545 Low back pain, unspecified: Secondary | ICD-10-CM | POA: Diagnosis not present

## 2023-09-08 DIAGNOSIS — Z87440 Personal history of urinary (tract) infections: Secondary | ICD-10-CM | POA: Diagnosis not present

## 2023-09-08 DIAGNOSIS — M48061 Spinal stenosis, lumbar region without neurogenic claudication: Secondary | ICD-10-CM | POA: Diagnosis not present

## 2023-09-08 DIAGNOSIS — M51369 Other intervertebral disc degeneration, lumbar region without mention of lumbar back pain or lower extremity pain: Secondary | ICD-10-CM | POA: Diagnosis not present

## 2023-09-08 DIAGNOSIS — R8290 Unspecified abnormal findings in urine: Secondary | ICD-10-CM | POA: Insufficient documentation

## 2023-09-08 DIAGNOSIS — Z4789 Encounter for other orthopedic aftercare: Secondary | ICD-10-CM | POA: Diagnosis not present

## 2023-09-08 LAB — POC URINALSYSI DIPSTICK (AUTOMATED)
Bilirubin, UA: NEGATIVE
Blood, UA: NEGATIVE
Glucose, UA: NEGATIVE
Ketones, UA: NEGATIVE
Nitrite, UA: NEGATIVE
Protein, UA: NEGATIVE
Spec Grav, UA: 1.02 (ref 1.010–1.025)
Urobilinogen, UA: 0.2 U/dL
pH, UA: 5.5 (ref 5.0–8.0)

## 2023-09-08 LAB — POCT UA - MICROSCOPIC ONLY
Bacteria, U Microscopic: 0
RBC, Urine, Miroscopic: 0 (ref 0–2)
WBC, Ur, HPF, POC: 0 (ref 0–5)

## 2023-09-08 NOTE — Patient Instructions (Signed)
 Xray today  We will reach out with results   If symptoms persist please follow up with your neuro surgeon    If any urinary symptoms start let us  know   Follow up with Dr Crissie Dome for hand and feet tingling

## 2023-09-08 NOTE — Progress Notes (Signed)
 Subjective:    Patient ID: Carrie Lucas, female    DOB: 1940/10/21, 83 y.o.   MRN: [id number]  HPI  Wt Readings from Last 3 Encounters:  09/08/23 154 lb 4 oz (70 kg)  08/29/23 156 lb (70.8 kg)  07/22/23 158 lb 2 oz (71.7 kg)   28.67 kg/m  Vitals:   09/08/23 0916  BP: 132/64  Pulse: 65  Temp: 98 F (36.7 C)  SpO2: (!) 64%    83 yo pt of Dr Carrie Lucas presents with low back pain  Burning sensation radiating to buttock area  Tingling in feet and hands -mentions this later in visit   She has history of CKD and lumbar spinal stenosis and urge incontinence  Last uti was in oct 2024 with e coli resistant to quinolones and amp   Started about 10 days ago  No trauma  No new activity  Burning sensation across low back and down into buttocks Also radiates around the front  Both sides equally Occational fleeting sharp pain   Tylenol  does not help  Bio freeze on back helps  No difference with positional change    No new urinary symptoms  No fever No nausea     History of low back pain  Last MRI was 2023 Per report IMPRESSION: Multilevel degenerative changes as detailed above. There is progression primarily at L3-L4 with increased canal, subarticular recess, and foraminal stenosis   Had lumbar fusion 5/ 2024 -helped a lot  Chronic tingling in legs and feet and hands for years  Has never talked to doctor about it   Last PT was before surgery    Lab Results  Component Value Date   NA 140 08/29/2023   K 5.1 08/29/2023   CO2 22 08/29/2023   GLUCOSE 81 08/29/2023   BUN 28 (H) 08/29/2023   CREATININE 1.75 (H) 08/29/2023   CALCIUM  9.4 08/29/2023   GFR 26.68 (L) 08/29/2023   EGFR 44 06/09/2021   GFRNONAA 35 (L) 07/12/2023   Seeing a nephrologist   No results found for: "HGBA1C"  Lab Results  Component Value Date   VITAMINB12 821 01/14/2023   Lab Results  Component Value Date   ESRSEDRATE 21 01/21/2023   Urinalysis today Results for orders placed or  performed in visit on 09/08/23  POCT Urinalysis Dipstick (Automated)   Collection Time: 09/08/23  9:45 AM  Result Value Ref Range   Color, UA yellow    Clarity, UA clear    Glucose, UA Negative Negative   Bilirubin, UA negative    Ketones, UA negative    Spec Grav, UA 1.020 1.010 - 1.025   Blood, UA negative    pH, UA 5.5 5.0 - 8.0   Protein, UA Negative Negative   Urobilinogen, UA 0.2 0.2 or 1.0 E.U./dL   Nitrite, UA negative    Leukocytes, UA Small (1+) (A) Negative  POCT UA - Microscopic Only   Collection Time: 09/08/23 11:40 AM  Result Value Ref Range   WBC, Ur, HPF, POC 0 0 - 5   RBC, Urine, Miroscopic 0 0 - 2   Bacteria, U Microscopic 0 None - Trace   Mucus, UA few    Epithelial cells, urine per micros few    Crystals, Ur, HPF, POC none    Casts, Ur, LPF, POC none    Yeast, UA none       Xray LS Narrative & Impression  CLINICAL DATA:  Post lumbar fusion   EXAM: LUMBAR SPINE -  2-3 VIEW   COMPARISON:  December 21, 2022   FINDINGS: No change status post transpedicular fixation L3-L4 L5, near anatomic alignment. Narrowing of the L4-5 L5-S1 disc spaces   Advanced degenerative facet disease L4-5 L5-S1   No hardware failure   No fractures   IMPRESSION: Post-lumbar fusion as described without significant change compared with prior examination. No hardware failure.      Patient Active Problem List   Diagnosis Date Noted   Low back pain 09/08/2023   History of UTI 09/08/2023   Abnormal urinalysis 09/08/2023   Paresthesia 09/08/2023   Left leg swelling 08/29/2023   Stasis dermatitis 08/29/2023   Podagra 02/16/2023   Anemia in chronic kidney disease (CKD) 01/21/2023   Urge incontinence 01/21/2023   S/P lumbar fusion 09/03/2022   Low serum vitamin B12 06/21/2022   Chronic fatigue 01/19/2022   Lumbar facet arthropathy 12/26/2021   Lumbar spinal stenosis 10/05/2021   Dysplastic nevus 08/04/2021   Hypercalcemia 08/01/2021   Positive ANA (antinuclear  antibody) 03/05/2021   Erosive osteoarthritis of both hands 01/20/2021   Chronic venous insufficiency of lower extremity 07/01/2020   Peripheral neuropathy 07/01/2020   Lymphocytosis 01/07/2020   Bilateral hearing loss 01/07/2020   Dizziness 07/23/2019   Vision problem 07/05/2019   Chronic pain of both shoulders 01/05/2019   Pedal edema 09/11/2018   Renal cyst, acquired, right 07/08/2018   Spondylolisthesis, lumbar region 12/20/2017   BPPV (benign paroxysmal positional vertigo), left 09/07/2016   Advanced care planning/counseling discussion 12/16/2014   Carotid stenosis, asymptomatic 12/16/2014   Pterygium of both eyes 05/06/2014   History of radial keratotomy 03/13/2014   Medicare annual wellness visit, subsequent 12/12/2013   CKD (chronic kidney disease) stage 3, GFR 30-59 ml/min (HCC) 12/12/2013   DDD (degenerative disc disease), lumbar    Hepatic steatosis    Osteopenia with high risk of fracture    HTN (hypertension)    HLD (hyperlipidemia)    LPRD (laryngopharyngeal reflux disease)    Insomnia    IBS (irritable bowel syndrome)    Osteoarthritis    Abdominal bloating 09/09/2013   Past Medical History:  Diagnosis Date   Arthritis    R middle finger, chronic arthralgias   BCC (basal cell carcinoma of skin) 06/2015, 06/2017   supraclavicular, nasal bridge, R lower back - Carrie Lucas   Carotid stenosis, asymptomatic 12/16/2014   Mild bilateral 1-39%, consider rpt US  1 yr (11/2014)    Chronic renal disease, stage III (HCC)    Colon polyp    DDD (degenerative disc disease), lumbar 2014   severe facet degeneration L4/5, mod spinal setnosis, mild foraminal stenosis   Dysplastic nevus 08/04/2021   To mid upper back, with severe atypia, rec re-excision (Carrie Lucas) 06/2021   Erosive osteoarthritis    both hands   Headache    Hepatic steatosis 2012   mild by CT   History of melanoma 2003   face (Dr Carrie Lucas)   HLD (hyperlipidemia)    HTN (hypertension)    IBS (irritable bowel  syndrome)    Insomnia    LPRD (laryngopharyngeal reflux disease)    Carrie Lucas)   Osteopenia 2011   DEXA T -1.6 hip, -1.3 forearm (12/2015)   Palpitations    Sialadenitis 05/2013   acute Carrie Lucas)   SK (seborrheic keratosis) 06/2020   L lateral upper back (Carrie Lucas)   UTI (urinary tract infection) 10/22/2016   UTI x3 2019 s/p stable urology evaluation, PVR = 0, cystoscopy WNL 08/2017 ([facility])  UTI 08/2018 treated with keflex   Past Surgical History:  Procedure Laterality Date   ABI  06/2010   WNL   BACK SURGERY  1995   lumbar   BILATERAL OOPHORECTOMY  2007   ovarian cysts   BLADDER SUSPENSION     midurethral sling for SUI   CARPAL TUNNEL RELEASE Bilateral 2000   COLONOSCOPY  8 30 12    TAx1, diverticulosis, rpt 5 yrs ([facility])   COLONOSCOPY  03/2016   WNL ([facility])   DEXA  2011   mild osteopenia (T -1.2 R femur)   ESI  2014   L L4 transforaminal   ESOPHAGOGASTRODUODENOSCOPY  12/24/2010   reflux esophagitis   LAMINECTOMY WITH POSTERIOR LATERAL ARTHRODESIS LEVEL 2 N/A 09/03/2022   Procedure: Laminectomy and Foraminotomy - Lumbar Three-Lumbar Four, posterolateral fusion and segmental instrumentation Lumbar Three-Five;  Surgeon: Isadora Mar, MD;  Location: [facility];  Service: Neurosurgery;  Laterality: N/A;   MOHS SURGERY  2016   BCC   RADIAL KERATOTOMY Left    TONSILLECTOMY  Childhood   VAGINAL HYSTERECTOMY     bladder drop   Social History   Tobacco Use   Smoking status: Never   Smokeless tobacco: Never  Vaping Use   Vaping status: Never Used  Substance Use Topics   Alcohol use: No    Alcohol/week: 0.0 standard drinks of alcohol   Drug use: No   Family History  Problem Relation Age of Onset   Osteoarthritis Mother    Pulmonary fibrosis Mother 9   Arthritis Mother    Thyroid  disease Mother    Diabetes Father    Hypertension Father    Stroke Father 82   Hyperlipidemia Father    Breast cancer Sister 71 - 27   Breast cancer Sister 2 - 66   CAD Sister    Heart  attack Sister    Thyroid  disease Sister    Stroke Sister    Cervical cancer Sister    Murrell Arrant' disease Sister    Sleep apnea Sister    Ovarian cancer Sister 15   Osteoarthritis Maternal Grandmother    Allergies  Allergen Reactions   Naproxen Itching   Current Outpatient Medications on File Prior to Visit  Medication Sig Dispense Refill   acetaminophen  (TYLENOL ) 650 MG CR tablet Take 1 tablet (650 mg total) by mouth 3 (three) times daily as needed for pain.     allopurinol  (ZYLOPRIM ) 100 MG tablet Take 1 tablet (100 mg total) by mouth daily. 30 tablet 6   amLODipine  (NORVASC ) 5 MG tablet Take 0.5 tablets (2.5 mg total) by mouth daily.     ascorbic acid  (VITAMIN C) 500 MG tablet Take 1 tablet (500 mg total) by mouth daily.     aspirin  EC 81 MG tablet Take 1 tablet (81 mg total) by mouth every Monday, Wednesday, and Friday.     atorvastatin  (LIPITOR) 20 MG tablet Take 1 tablet (20 mg total) by mouth daily. 90 tablet 4   augmented betamethasone dipropionate (DIPROLENE-AF) 0.05 % cream Apply 1 Application topically 2 (two) times daily.     b complex vitamins capsule Take 1 capsule by mouth daily.     Cholecalciferol (VITAMIN D3) 1000 units CAPS Take 1,000 Units by mouth daily.     cyanocobalamin  1000 MCG tablet Take 1 tablet (1,000 mcg total) by mouth every Monday, Wednesday, and Friday.     metoprolol  succinate (TOPROL -XL) 25 MG 24 hr tablet Take 0.5 tablets (12.5 mg total) by mouth daily. 45 tablet 4   valsartan  (DIOVAN ) 320  MG tablet Take 1 tablet (320 mg total) by mouth daily. (Patient taking differently: Take 320 mg by mouth daily. Takes 0.5 mg tablets daily) 90 tablet 4   No current facility-administered medications on file prior to visit.    Review of Systems  Constitutional:  Negative for activity change, appetite change, fatigue, fever and unexpected weight change.  HENT:  Negative for congestion, ear pain, rhinorrhea, sinus pressure and sore throat.   Eyes:  Negative for pain,  redness and visual disturbance.  Respiratory:  Negative for cough, shortness of breath and wheezing.   Cardiovascular:  Negative for chest pain and palpitations.  Gastrointestinal:  Negative for abdominal pain, blood in stool, constipation and diarrhea.  Endocrine: Negative for polydipsia and polyuria.  Genitourinary:  Negative for dysuria, flank pain, frequency, hematuria, pelvic pain and urgency.  Musculoskeletal:  Positive for back pain. Negative for arthralgias and myalgias.  Skin:  Negative for pallor and rash.  Allergic/Immunologic: Negative for environmental allergies.  Neurological:  Negative for dizziness, syncope and headaches.       Burning in low back /buttocks  Tingling in fingers and toes   Hematological:  Negative for adenopathy. Does not bruise/bleed easily.  Psychiatric/Behavioral:  Negative for decreased concentration and dysphoric mood. The patient is not nervous/anxious.        Objective:   Physical Exam Constitutional:      General: She is not in acute distress.    Appearance: Normal appearance. She is well-developed. She is not ill-appearing or diaphoretic.  HENT:     Head: Normocephalic and atraumatic.  Eyes:     General: No scleral icterus.    Conjunctiva/sclera: Conjunctivae normal.     Pupils: Pupils are equal, round, and reactive to light.  Cardiovascular:     Rate and Rhythm: Normal rate and regular rhythm.  Pulmonary:     Effort: Pulmonary effort is normal.     Breath sounds: Normal breath sounds. No wheezing or rales.  Abdominal:     General: Bowel sounds are normal. There is no distension.     Palpations: Abdomen is soft.     Tenderness: There is no abdominal tenderness. There is no right CVA tenderness or left CVA tenderness.     Comments: No suprapubic tenderness or fullness     Musculoskeletal:        General: Tenderness present.     Cervical back: Normal range of motion and neck supple.     Lumbar back: No swelling, edema, deformity,  spasms, tenderness or bony tenderness. Normal range of motion. Negative right straight leg raise test and negative left straight leg raise test.     Comments: LS No midline tenderness Scar noted Loss of lordosis noted Flex limited by discomfort / rom limited by fusion  No muscular tenderness or obvious spasm   Neg SLR  Normal rom of hips Normal gait     Lymphadenopathy:     Cervical: No cervical adenopathy.  Skin:    General: Skin is warm and dry.     Coloration: Skin is not pale.     Findings: No erythema or rash.  Neurological:     Mental Status: She is alert.     Cranial Nerves: No cranial nerve deficit.     Sensory: No sensory deficit.     Motor: No atrophy or abnormal muscle tone.     Coordination: Coordination normal.     Deep Tendon Reflexes: Reflexes are normal and symmetric.     Comments: Negative SLR  Assessment & Plan:   Problem List Items Addressed This Visit       Genitourinary   History of UTI   Urinalysis is clear today      Relevant Orders   POCT UA - Microscopic Only (Completed)     Other   Paresthesia   Per pt going on for "a while"  Wonder about possible periph neuropathy  Recommend follow up with pcp when he returns to discuss furtehr       Lumbar spinal stenosis   ? Adding to symptoms  Today more of a burning sensation- upper buttock area  No change on xray  Recommend follow up with spine specialist       Relevant Orders   DG Lumbar Spine 2-3 Views (Completed)   Low back pain - Primary   Acute on chronic  This time different  Some tingling /burning across entire low back radiating to buttocks (no cva tenderness)  No change with change in position  Does not go below buttocks Normal exam   History of LS fusion  Xray ordered- normal appearing with hardware and no significant changes Narrowing of L4 to S1 discussed spaces  Neg urinalysis   Recommend follow up with back specialist Update if not starting to improve  in a week or if worsening  Call back and Er precautions noted in detail today        Relevant Orders   POCT Urinalysis Dipstick (Automated) (Completed)   DG Lumbar Spine 2-3 Views (Completed)   Abnormal urinalysis   Urinalysis noted small leuk None seen on micro  Reassuring       Relevant Orders   POCT UA - Microscopic Only (Completed)

## 2023-09-08 NOTE — Assessment & Plan Note (Signed)
 Urinalysis noted small leuk None seen on micro  Reassuring

## 2023-09-08 NOTE — Assessment & Plan Note (Signed)
?   Adding to symptoms  Today more of a burning sensation- upper buttock area  No change on xray  Recommend follow up with spine specialist

## 2023-09-08 NOTE — Assessment & Plan Note (Signed)
 Per pt going on for "a while"  Wonder about possible periph neuropathy  Recommend follow up with pcp when he returns to discuss furtehr

## 2023-09-08 NOTE — Assessment & Plan Note (Signed)
 Acute on chronic  This time different  Some tingling /burning across entire low back radiating to buttocks (no cva tenderness)  No change with change in position  Does not go below buttocks Normal exam   History of LS fusion  Xray ordered- normal appearing with hardware and no significant changes Narrowing of L4 to S1 discussed spaces  Neg urinalysis   Recommend follow up with back specialist Update if not starting to improve in a week or if worsening  Call back and Er precautions noted in detail today

## 2023-09-08 NOTE — Assessment & Plan Note (Signed)
 Urinalysis is clear today

## 2023-09-13 ENCOUNTER — Ambulatory Visit: Payer: Self-pay | Admitting: Family Medicine

## 2023-09-13 ENCOUNTER — Other Ambulatory Visit (INDEPENDENT_AMBULATORY_CARE_PROVIDER_SITE_OTHER)

## 2023-09-13 DIAGNOSIS — N1832 Chronic kidney disease, stage 3b: Secondary | ICD-10-CM | POA: Diagnosis not present

## 2023-09-13 LAB — RENAL FUNCTION PANEL
Albumin: 4.1 g/dL (ref 3.5–5.2)
BUN: 23 mg/dL (ref 6–23)
CO2: 21 meq/L (ref 19–32)
Calcium: 9.3 mg/dL (ref 8.4–10.5)
Chloride: 107 meq/L (ref 96–112)
Creatinine, Ser: 1.34 mg/dL — ABNORMAL HIGH (ref 0.40–1.20)
GFR: 36.75 mL/min — ABNORMAL LOW (ref >60.00)
Glucose, Bld: 179 mg/dL — ABNORMAL HIGH (ref 70–99)
Phosphorus: 3.1 mg/dL (ref 2.3–4.6)
Potassium: 4.1 meq/L (ref 3.5–5.1)
Sodium: 138 meq/L (ref 135–145)

## 2023-09-16 ENCOUNTER — Other Ambulatory Visit: Payer: Self-pay

## 2023-09-16 ENCOUNTER — Emergency Department (HOSPITAL_COMMUNITY)
Admission: EM | Admit: 2023-09-16 | Discharge: 2023-09-16 | Disposition: A | Discharge status: Home / Self Care | Attending: Emergency Medicine | Admitting: Emergency Medicine

## 2023-09-16 ENCOUNTER — Emergency Department (HOSPITAL_COMMUNITY)

## 2023-09-16 DIAGNOSIS — Z7982 Long term (current) use of aspirin: Secondary | ICD-10-CM | POA: Diagnosis not present

## 2023-09-16 DIAGNOSIS — N189 Chronic kidney disease, unspecified: Secondary | ICD-10-CM | POA: Diagnosis not present

## 2023-09-16 DIAGNOSIS — M799 Soft tissue disorder, unspecified: Secondary | ICD-10-CM | POA: Diagnosis not present

## 2023-09-16 DIAGNOSIS — M79674 Pain in right toe(s): Secondary | ICD-10-CM | POA: Diagnosis present

## 2023-09-16 DIAGNOSIS — M109 Gout, unspecified: Secondary | ICD-10-CM | POA: Insufficient documentation

## 2023-09-16 DIAGNOSIS — Z79899 Other long term (current) drug therapy: Secondary | ICD-10-CM | POA: Diagnosis not present

## 2023-09-16 DIAGNOSIS — I129 Hypertensive chronic kidney disease with stage 1 through stage 4 chronic kidney disease, or unspecified chronic kidney disease: Secondary | ICD-10-CM | POA: Insufficient documentation

## 2023-09-16 DIAGNOSIS — M19071 Primary osteoarthritis, right ankle and foot: Secondary | ICD-10-CM | POA: Diagnosis not present

## 2023-09-16 DIAGNOSIS — M79671 Pain in right foot: Secondary | ICD-10-CM | POA: Diagnosis not present

## 2023-09-16 MED ORDER — PREDNISONE 20 MG PO TABS: 40.0000 mg | ORAL_TABLET | Freq: Every day | ORAL | 0 refills | Status: AC

## 2023-09-16 MED ORDER — LIDOCAINE 5 % EX PTCH
1.0000 | MEDICATED_PATCH | CUTANEOUS | Status: DC
  Administered 2023-09-16: 1 via TRANSDERMAL
  Filled 2023-09-16: qty 1

## 2023-09-16 NOTE — Discharge Instructions (Addendum)
 You were seen in the ER today for evaluation of your toe pain.  This is likely your gout or arthritis.  Minna prescribe you some prednisone  to take daily for the next 5 days.  I have included more information on the RICE method.  Please make sure that you are elevating your foot as this will help with pain and with some swelling.  You can take Tylenol  as needed for pain as well.  Please make sure you follow with your primary care doctor on Monday/Tuesday for reevaluation of this.  If you should start to develop any worsening redness, fever, worsening swelling, worsening pain, red streaking, numbness, tingling, please return to your nearest emergency department for reevaluation.  If you have any other concerns, new or worsening symptoms, please return to the nearest Emergency Department for evaluation.  Contact a doctor if: You have another gout attack. You still have symptoms of a gout attack after 10 days of treatment. You have problems (side effects) because of your medicines. You have chills or a fever. You have burning pain when you pee (urinate). You have pain in your lower back or belly. Get help right away if: You have very bad pain. Your pain cannot be controlled. You cannot pee.

## 2023-09-16 NOTE — ED Triage Notes (Signed)
 Pt c/o throbbing pain in ball of R foot that they noticed this AM. Swelling noted around joint.  Hx gout, compliant with meds

## 2023-09-16 NOTE — ED Provider Notes (Signed)
 [place] EMERGENCY DEPARTMENT AT [facility] Provider Note   CSN: [id number] Arrival date & time: 09/16/23  1839     History Chief Complaint  Patient presents with   Foot Pain    Carrie Lucas is a 83 y.o. female with h/o CKD, HTN, HLD, gout on allopurinol , presents emerged from today for evaluation of right great toe joint pain since this morning.  Patient reports it is in the same location that she has had gout before.  She reports that she is compliant with her allopurinol .  Denies any injury.  Denies any fever.  Did take some Tylenol  however did not have any improvement of pain.  She reports that she has been given prednisone  for this before, denies any diabetes.   Foot Pain       Home Medications Prior to Admission medications   Medication Sig Start Date End Date Taking? Authorizing Provider  acetaminophen  (TYLENOL ) 650 MG CR tablet Take 1 tablet (650 mg total) by mouth 3 (three) times daily as needed for pain. 01/07/20   Claire Crick, MD  allopurinol  (ZYLOPRIM ) 100 MG tablet Take 1 tablet (100 mg total) by mouth daily. 03/11/23   Claire Crick, MD  amLODipine  (NORVASC ) 5 MG tablet Take 0.5 tablets (2.5 mg total) by mouth daily. 08/29/23   Claire Crick, MD  ascorbic acid  (VITAMIN C) 500 MG tablet Take 1 tablet (500 mg total) by mouth daily. 03/11/23   Claire Crick, MD  aspirin  EC 81 MG tablet Take 1 tablet (81 mg total) by mouth every Monday, Wednesday, and Friday. 12/19/15   Claire Crick, MD  atorvastatin  (LIPITOR) 20 MG tablet Take 1 tablet (20 mg total) by mouth daily. 01/21/23   Claire Crick, MD  augmented betamethasone dipropionate (DIPROLENE-AF) 0.05 % cream Apply 1 Application topically 2 (two) times daily. 08/06/23   [provider]  b complex vitamins capsule Take 1 capsule by mouth daily.    [provider]  Cholecalciferol (VITAMIN D3) 1000 units CAPS Take 1,000 Units by mouth daily.    [provider]   cyanocobalamin  1000 MCG tablet Take 1 tablet (1,000 mcg total) by mouth every Monday, Wednesday, and Friday. 06/23/22   Claire Crick, MD  metoprolol  succinate (TOPROL -XL) 25 MG 24 hr tablet Take 0.5 tablets (12.5 mg total) by mouth daily. 01/21/23   Claire Crick, MD  valsartan  (DIOVAN ) 320 MG tablet Take 1 tablet (320 mg total) by mouth daily. Patient taking differently: Take 320 mg by mouth daily. Takes 0.5 mg tablets daily 01/21/23   Claire Crick, MD      Allergies    Naproxen    Review of Systems   Review of Systems  Constitutional:  Negative for chills and fever.  Musculoskeletal:  Positive for arthralgias.    Physical Exam Updated Vital Signs BP (!) 165/49 (BP Location: Left Arm)   Pulse 74   Temp 98.1 F (36.7 C) (Oral)   Resp 18   Wt 70.8 kg   SpO2 96%   BMI 29.00 kg/m  Physical Exam Vitals and nursing note reviewed.  Constitutional:      General: She is not in acute distress.    Appearance: She is not ill-appearing or toxic-appearing.  Eyes:     General: No scleral icterus. Pulmonary:     Effort: Pulmonary effort is normal. No respiratory distress.  Musculoskeletal:        General: Tenderness present.  Feet:     Comments: Tenderness over the first MTP of  the right foot.  Some minimal swelling noted.  Minimal increase in warmth and erythema.  Brisk refill present to all 5 toes.  Patient can wiggle toes but does have some pain with movement of the great toe.  Palpable DP and PT pulses.  No other swelling or red streaking noted.  Only has tenderness to the MTP/ball of the foot in this area.  There is a mole with about of the foot but there is no wound or scabbing or injury/skin break noted. Compartments soft.  Skin:    General: Skin is warm and dry.     Capillary Refill: Capillary refill takes less than 2 seconds.  Neurological:     Mental Status: She is alert.     ED Results / Procedures / Treatments   Labs (all labs ordered are listed, but only  abnormal results are displayed) Labs Reviewed - No data to display  EKG None  Radiology DG Foot Complete Right Result Date: 09/16/2023 CLINICAL DATA:  Pain. EXAM: RIGHT FOOT COMPLETE - 3+ VIEW COMPARISON:  None Available. FINDINGS: There is no evidence of fracture or dislocation. Mild degenerative spurring of the first metatarsal phalangeal joint. No erosions or periostitis. Diminutive plantar calcaneal spur. Mild soft tissue thickening about the medial first metatarsal phalangeal joint. IMPRESSION: 1. Mild osteoarthritis of the first metatarsophalangeal joint. 2. Soft tissue thickening medial to the first metatarsal phalangeal joint can be seen in the setting of gout. No erosions. Electronically Signed   By: Chadwick Colonel M.D.   On: 09/16/2023 19:25    Procedures Procedures   Medications Ordered in ED Medications  lidocaine  (LIDODERM ) 5 % 1 patch (has no administration in time range)    ED Course/ Medical Decision Making/ A&P                                Medical Decision Making Risk Prescription drug management.   83 y.o. female presents to the ER for evaluation of foot pain. Differential diagnosis includes but is not limited to gout, arthritis, infection, cellulitis. Vital signs elevated BP at 165/49, otherwise unremarkable. Physical exam as noted above.   XR  1. Mild osteoarthritis of the first metatarsophalangeal joint. 2. Soft tissue thickening medial to the first metatarsal phalangeal joint can be seen in the setting of gout. No erosions. Per radiologist's interpretation.    Lidoderm  and post op shoe given. This appears to be more gout versus arthritis, I do no think this is septic arthritis in appearance. Will give her strict return precautions on this. She reports that she has done well on prednisone  before. With CKD, can not do NSAIDs.  We discussed follow-up with her PCP on Monday/Tuesday for reevaluation.  Patient stable for discharge home.  We discussed the results  of the labs/imaging. The plan is follow-up, take medications appropriately.. We discussed strict return precautions and red flag symptoms. The patient verbalized their understanding and agrees to the plan. The patient is stable and being discharged home in good condition.  Portions of this report may have been transcribed using voice recognition software. Every effort was made to ensure accuracy; however, inadvertent computerized transcription errors may be present.   Final Clinical Impression(s) / ED Diagnoses Final diagnoses:  Podagra    Rx / DC Orders ED Discharge Orders          Ordered    predniSONE  (DELTASONE ) 20 MG tablet  Daily  09/16/23 2104              Spence Dux, PA-C 09/16/23 2124    Wynetta Heckle, MD 09/29/23 1201

## 2023-09-28 ENCOUNTER — Encounter: Payer: Self-pay | Admitting: Family Medicine

## 2023-09-28 ENCOUNTER — Ambulatory Visit (INDEPENDENT_AMBULATORY_CARE_PROVIDER_SITE_OTHER): Admitting: Family Medicine

## 2023-09-28 VITALS — BP 126/66 | HR 66 | Temp 99.6°F | Ht 61.5 in | Wt 154.0 lb

## 2023-09-28 DIAGNOSIS — G4733 Obstructive sleep apnea (adult) (pediatric): Secondary | ICD-10-CM | POA: Insufficient documentation

## 2023-09-28 DIAGNOSIS — G6289 Other specified polyneuropathies: Secondary | ICD-10-CM

## 2023-09-28 DIAGNOSIS — I872 Venous insufficiency (chronic) (peripheral): Secondary | ICD-10-CM | POA: Diagnosis not present

## 2023-09-28 DIAGNOSIS — M109 Gout, unspecified: Secondary | ICD-10-CM

## 2023-09-28 DIAGNOSIS — I10 Essential (primary) hypertension: Secondary | ICD-10-CM

## 2023-09-28 MED ORDER — ALLOPURINOL 100 MG PO TABS: 200.0000 mg | ORAL_TABLET | Freq: Every day | ORAL | 2 refills | Status: DC

## 2023-09-28 MED ORDER — PREDNISONE 20 MG PO TABS: ORAL_TABLET | ORAL | 0 refills | Status: DC

## 2023-09-28 MED ORDER — VALSARTAN 160 MG PO TABS: 160.0000 mg | ORAL_TABLET | Freq: Every day | ORAL | 2 refills | Status: DC

## 2023-09-28 NOTE — Progress Notes (Signed)
 Ph: [phone] Fax: [phone]   Patient ID: Carrie Lucas, female    DOB: 1941/03/14, 83 y.o.   MRN: [id number]  This visit was conducted in person.  BP 126/66   Pulse 66   Temp 99.6 F (37.6 C) (Oral)   Ht 5' 1.5" (1.562 m)   Wt 154 lb (69.9 kg)   SpO2 94%   BMI 28.63 kg/m    CC: R podagra Subjective:   HPI: Carrie Lucas is a 83 y.o. female presenting on 09/28/2023 for Medical Management of Chronic Issues (Here for gout flare in R great toe. Seen on 09/16/23 at [facility] ED, dx podagra.)   Recent [facility] ER visit 09/16/2023 diagnosed with R toe podagra treated with prednisone  40mg  5d burst with resolution. 1 wk later symptoms returned.  No known trigger - no recent diet changes.   Known gout treated with allopurinol  100mg  daily.  Lab Results  Component Value Date   LABURIC 7.6 (H) 07/13/2023   Lasix  worsened renal insufficiency.  Notes improvement but persistence of pruritic LLE rash - plans to return to see derm Dr Theron Flavin.   Notes ongoing paresthesias to legs and feet worse at night, present for years. No burning pain. No related muscle weakness. Hands not affected. Low back surgery didn't help. No h/o diabetes, no alcohol use, no h/o b12 def.   No results found for: "HGBA1C"  Lab Results  Component Value Date   VITAMINB12 821 01/14/2023   OSA on CPAP started this year, followed by [facility] Johny Nap NP.      Relevant past medical, surgical, family and social history reviewed and updated as indicated. Interim medical history since our last visit reviewed. Allergies and medications reviewed and updated. Outpatient Medications Prior to Visit  Medication Sig Dispense Refill   acetaminophen  (TYLENOL ) 650 MG CR tablet Take 1 tablet (650 mg total) by mouth 3 (three) times daily as needed for pain.     amLODipine  (NORVASC ) 5 MG tablet Take 0.5 tablets (2.5 mg total) by mouth daily.     ascorbic acid  (VITAMIN C) 500 MG tablet Take 1 tablet (500 mg total) by  mouth daily.     aspirin  EC 81 MG tablet Take 1 tablet (81 mg total) by mouth every Monday, Wednesday, and Friday.     atorvastatin  (LIPITOR) 20 MG tablet Take 1 tablet (20 mg total) by mouth daily. 90 tablet 4   augmented betamethasone dipropionate (DIPROLENE-AF) 0.05 % cream Apply 1 Application topically 2 (two) times daily.     b complex vitamins capsule Take 1 capsule by mouth daily.     Cholecalciferol (VITAMIN D3) 1000 units CAPS Take 1,000 Units by mouth daily.     cyanocobalamin  1000 MCG tablet Take 1 tablet (1,000 mcg total) by mouth every Monday, Wednesday, and Friday.     metoprolol  succinate (TOPROL -XL) 25 MG 24 hr tablet Take 0.5 tablets (12.5 mg total) by mouth daily. 45 tablet 4   allopurinol  (ZYLOPRIM ) 100 MG tablet Take 1 tablet (100 mg total) by mouth daily. 30 tablet 6   valsartan  (DIOVAN ) 320 MG tablet Take 1 tablet (320 mg total) by mouth daily. (Patient taking differently: Take 320 mg by mouth daily. Takes 0.5 mg tablets daily) 90 tablet 4   No facility-administered medications prior to visit.     Per HPI unless specifically indicated in ROS section below Review of Systems  Objective:  BP 126/66   Pulse 66   Temp 99.6 F (37.6 C) (Oral)  Ht 5' 1.5" (1.562 m)   Wt 154 lb (69.9 kg)   SpO2 94%   BMI 28.63 kg/m   Wt Readings from Last 3 Encounters:  09/28/23 154 lb (69.9 kg)  09/16/23 156 lb (70.8 kg)  09/08/23 154 lb 4 oz (70 kg)      Physical Exam Vitals and nursing note reviewed.  Constitutional:      Appearance: Normal appearance. She is not ill-appearing.  Cardiovascular:     Rate and Rhythm: Normal rate and regular rhythm.     Pulses: Normal pulses.     Heart sounds: Normal heart sounds. No murmur heard. Pulmonary:     Effort: Pulmonary effort is normal. No respiratory distress.     Breath sounds: Normal breath sounds. No wheezing, rhonchi or rales.  Musculoskeletal:        General: Tenderness present.     Right lower leg: No edema.     Left  lower leg: No edema.     Comments:  Tenderness to medial R 1st MTPJ pain with axial loading, significant pain to palpation at this medial joint, able to flex/extend joint well.  2+ DP bilaterally  Skin:    General: Skin is warm and dry.     Findings: Erythema and rash (faint erythematous rash to RLE) present.     Comments: Erythema to R medial 1st MTPJ  Neurological:     Mental Status: She is alert.  Psychiatric:        Mood and Affect: Mood normal.        Behavior: Behavior normal.       Results for orders placed or performed in visit on 09/13/23  Renal function panel   Collection Time: 09/13/23  9:56 AM  Result Value Ref Range   Sodium 138 135 - 145 mEq/L   Potassium 4.1 3.5 - 5.1 mEq/L   Chloride 107 96 - 112 mEq/L   CO2 21 19 - 32 mEq/L   Albumin 4.1 3.5 - 5.2 g/dL   BUN 23 6 - 23 mg/dL   Creatinine, Ser 0.96 (H) 0.40 - 1.20 mg/dL   Glucose, Bld 045 (H) 70 - 99 mg/dL   Phosphorus 3.1 2.3 - 4.6 mg/dL   GFR 40.98 (L) >11.91 mL/min   Calcium  9.3 8.4 - 10.5 mg/dL   Lab Results  Component Value Date   WBC 8.1 07/12/2023   HGB 11.4 (L) 07/12/2023   HCT 35.2 (L) 07/12/2023   MCV 95.9 07/12/2023   PLT 356 07/12/2023   Assessment & Plan:   Problem List Items Addressed This Visit     HTN (hypertension)   BP well controlled today. Continue current regimen including low dose amlodipine  and lower valsartan  dose.  Did not tolerate lasix  - worsened renal function      Relevant Medications   valsartan  (DIOVAN ) 160 MG tablet   Peripheral neuropathy   Describes chronic bilaterally symmetrical length dependent sensory neuropathy, ongoing for years.  Ongoing paresthesias to distal BLE without burning neuropathic pain.  Don't think gabapentin  would be helpful - but could consider trial. Offered NCS - she declines at this time.       Podagra - Primary   Recurrent podagra after finishing 5d prednisone  burst.  Anticipate needs extended steroid course - will send in 10d  taper. Reviewed gout dietary measures, will also increase allopurinol  to 200mg  daily.       Relevant Medications   allopurinol  (ZYLOPRIM ) 100 MG tablet   predniSONE  (DELTASONE ) 20 MG tablet  Stasis dermatitis   Pruritus is resoled but mild rash persists. ?med related. Planning to schedule derm f/u.       OSA on CPAP   Appreciate neurology care.         Meds ordered this encounter  Medications   allopurinol  (ZYLOPRIM ) 100 MG tablet    Sig: Take 2 tablets (200 mg total) by mouth daily.    Dispense:  180 tablet    Refill:  2   valsartan  (DIOVAN ) 160 MG tablet    Sig: Take 1 tablet (160 mg total) by mouth daily.    Dispense:  90 tablet    Refill:  2    Note new dose   predniSONE  (DELTASONE ) 20 MG tablet    Sig: Take two tablets daily for 5 days followed by one tablet daily for 5 days    Dispense:  15 tablet    Refill:  0    No orders of the defined types were placed in this encounter.   Patient Instructions  May use tart cherry juice.  Start prednisone  taper 2 tablets daily for 5 days followed by one tablet daily for 5 days.  Start higher allopurinol  dose 200mg  daily (2 tablets) I've also sent in lower valsartan  160mg  tablets to take one daily.  Consider nerve conduction study for further evaluation of extremity tingling /paresthesias.   Follow up plan: Return if symptoms worsen or fail to improve.  Claire Crick, MD

## 2023-09-28 NOTE — Assessment & Plan Note (Signed)
 Recurrent podagra after finishing 5d prednisone  burst.  Anticipate needs extended steroid course - will send in 10d taper. Reviewed gout dietary measures, will also increase allopurinol  to 200mg  daily.

## 2023-09-28 NOTE — Assessment & Plan Note (Signed)
Appreciate neurology care.  

## 2023-09-28 NOTE — Assessment & Plan Note (Addendum)
 Describes chronic bilaterally symmetrical length dependent sensory neuropathy, ongoing for years.  Ongoing paresthesias to distal BLE without burning neuropathic pain.  Don't think gabapentin  would be helpful - but could consider trial. Offered NCS - she declines at this time.

## 2023-09-28 NOTE — Patient Instructions (Addendum)
 May use tart cherry juice.  Start prednisone  taper 2 tablets daily for 5 days followed by one tablet daily for 5 days.  Start higher allopurinol  dose 200mg  daily (2 tablets) I've also sent in lower valsartan  160mg  tablets to take one daily.  Consider nerve conduction study for further evaluation of extremity tingling /paresthesias.

## 2023-09-28 NOTE — Assessment & Plan Note (Addendum)
 BP well controlled today. Continue current regimen including low dose amlodipine  and lower valsartan  dose.  Did not tolerate lasix  - worsened renal function

## 2023-09-28 NOTE — Assessment & Plan Note (Signed)
 Pruritus is resoled but mild rash persists. ?med related. Planning to schedule derm f/u.

## 2023-10-16 ENCOUNTER — Other Ambulatory Visit: Payer: Self-pay | Admitting: Family Medicine

## 2023-10-17 NOTE — Telephone Encounter (Signed)
 Too soon. Rx sent on 09/28/23, #180/2 refills to [facility]  Request denied.

## 2023-11-23 NOTE — Progress Notes (Addendum)
 [facility] [address] [place]. [place] [address] [phone]       OFFICE FOLLOW UP NOTE  Ms. ZYIONNA PESCE Date of Birth:  November 15, 1940 Medical Record Number:  [id number]    Primary neurologist: Dr. Buck Reason for visit: Sleep follow-up   Virtual Visit via Video Note  I connected with Merlynn GORMAN Ely on 11/24/2023 at  3:30 PM EDT by a video enabled telemedicine application and verified that I am speaking with the correct person using two identifiers.  Location: Patient: at home Provider: in office, [facility]   I discussed the limitations of evaluation and management by telemedicine and the availability of in person appointments. The patient expressed understanding and agreed to proceed.    SUBJECTIVE:   Follow-up visit:  Prior visit: 06/29/2023  Brief HPI:   LEONARDA LEIS is a 83 y.o. female who was evaluated by Dr. Buck in 01/2022 for concern of underlying sleep apnea and chronic difficulty initiating and maintaining sleep with reports of snoring and nonrestorative sleep.  It was recommended proceeding with a sleep study but was never pursued.  Patient returned in 06/2023 interested in pursuing sleep study.  ESS 5/24.  Completed in lab study 07/2023 which showed overall mild sleep apnea, more pronounced during REM sleep, with a total AHI of 5.8/h, REM AHI of 36.9/h and O2 nadir of 80% with time below 89% saturation was over 30 minutes, indicating nocturnal hypoxemia. AutoPAP set up 08/2023.     Interval history:  Patient is being seen today via MyChart visit for initial CPAP compliance visit. Has been taking some time to get used to but feels this has gradually been improving. Denies much benefit with CPAP thus far, still having insomnia (falling and staying asleep), nocturia, and continued excessive daytime fatigue which greatly interferes with daytime activity and functioning.  She does note that she has not experienced any morning headaches since starting  CPAP.  She routinely follows with PCP and nephrologist with routine lab work.  No further questions or concerns at this time.      ROS:   14 system review of systems performed and negative with exception of those listed in HPI  PMH:  Past Medical History:  Diagnosis Date   Arthritis    R middle finger, chronic arthralgias   BCC (basal cell carcinoma of skin) 06/2015, 06/2017   supraclavicular, nasal bridge, R lower back - [name]   Carotid stenosis, asymptomatic 12/16/2014   Mild bilateral 1-39%, consider rpt US  1 yr (11/2014)    Chronic renal disease, stage III (HCC)    Colon polyp    DDD (degenerative disc disease), lumbar 2014   severe facet degeneration L4/5, mod spinal setnosis, mild foraminal stenosis   Dysplastic nevus 08/04/2021   To mid upper back, with severe atypia, rec re-excision ([name]) 06/2021   Erosive osteoarthritis    both hands   Headache    Hepatic steatosis 2012   mild by CT   History of melanoma 2003   face (Dr Alexa)   HLD (hyperlipidemia)    HTN (hypertension)    IBS (irritable bowel syndrome)    Insomnia    LPRD (laryngopharyngeal reflux disease)    [facility])   Osteopenia 2011   DEXA T -1.6 hip, -1.3 forearm (12/2015)   Palpitations    Sialadenitis 05/2013   acute [facility])   SK (seborrheic keratosis) 06/2020   L lateral upper back ([name])   UTI (urinary tract infection) 10/22/2016   UTI x3 2019  s/p stable urology evaluation, PVR = 0, cystoscopy WNL 08/2017 ([facility])  UTI 08/2018 treated with keflex        PSH:  Past Surgical History:  Procedure Laterality Date   ABI  06/2010   WNL   BACK SURGERY  1995   lumbar   BILATERAL OOPHORECTOMY  2007   ovarian cysts   BLADDER SUSPENSION     midurethral sling for SUI   CARPAL TUNNEL RELEASE Bilateral 2000   COLONOSCOPY  8 30 12    TAx1, diverticulosis, rpt 5 yrs (Out[facility])   COLONOSCOPY  03/2016   WNL (Out[facility])   DEXA  2011   mild osteopenia (T -1.2 R femur)   ESI  2014   L L4 transforaminal    ESOPHAGOGASTRODUODENOSCOPY  12/24/2010   reflux esophagitis   LAMINECTOMY WITH POSTERIOR LATERAL ARTHRODESIS LEVEL 2 N/A 09/03/2022   Procedure: Laminectomy and Foraminotomy - Lumbar Three-Lumbar Four, posterolateral fusion and segmental instrumentation Lumbar Three-Five;  Surgeon: Joshua Alm RAMAN, MD;  Location: [facility] OR;  Service: Neurosurgery;  Laterality: N/A;   MOHS SURGERY  2016   BCC   RADIAL KERATOTOMY Left    TONSILLECTOMY  Childhood   VAGINAL HYSTERECTOMY     bladder drop    Social History:  Social History   Socioeconomic History   Marital status: Married    Spouse name: Not on file   Number of children: 1   Years of education: Not on file   Highest education level: Associate degree: occupational, Scientist, product/process development, or vocational program  Occupational History   Occupation: retired  Tobacco Use   Smoking status: Never   Smokeless tobacco: Never  Vaping Use   Vaping status: Never Used  Substance and Sexual Activity   Alcohol use: No    Alcohol/week: 0.0 standard drinks of alcohol   Drug use: No   Sexual activity: Yes    Birth control/protection: Surgical  Other Topics Concern   Not on file  Social History Narrative   Lives with husband   Activity: Advertising account executive, 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 Radio broadcast assistant   Derm - Dr. Lynnell (was Dr Dwane but he retired)   Dentist - Dr. Rodgers Aho   ENT - Dr Carlie   Nephrologist- Dr Jerrye    Social Drivers of Health   Financial Resource Strain: Low Risk  (08/26/2023)   Overall Financial Resource Strain (CARDIA)    Difficulty of Paying Living Expenses: Not hard at all  Food Insecurity: No Food Insecurity (08/26/2023)   Hunger Vital Sign    Worried About Running Out of Food in the Last Year: Never true    Ran Out of Food in the Last Year: Never true  Transportation Needs: No  Transportation Needs (08/26/2023)   PRAPARE - Administrator, Civil Service (Medical): No    Lack of Transportation (Non-Medical): No  Physical Activity: Unknown (08/26/2023)   Exercise Vital Sign    Days of Exercise per Week: 0 days    Minutes of Exercise per Session: Not on file  Stress: Patient Declined (08/26/2023)   [employer] of Occupational Health - Occupational Stress Questionnaire    Feeling of Stress : Patient declined  Social Connections: Moderately Integrated (08/26/2023)   Social Connection and Isolation Panel    Frequency of Communication with Friends and Family: More  than three times a week    Frequency of Social Gatherings with Friends and Family: Patient declined    Attends Religious Services: More than 4 times per year    Active Member of [employer] or Organizations: No    Attends Engineer, structural: Not on file    Marital Status: Married  Catering manager Violence: Not At Risk (01/02/2019)   Humiliation, Afraid, Rape, and Kick questionnaire    Fear of Current or Ex-Partner: No    Emotionally Abused: No    Physically Abused: No    Sexually Abused: No    Family History:  Family History  Problem Relation Age of Onset   Osteoarthritis Mother    Pulmonary fibrosis Mother 7   Arthritis Mother    Thyroid  disease Mother    Diabetes Father    Hypertension Father    Stroke Father 31   Hyperlipidemia Father    Breast cancer Sister 86 - 26   Breast cancer Sister 20 - 51   CAD Sister    Heart attack Sister    Thyroid  disease Sister    Stroke Sister    Cervical cancer Sister    Graves' disease Sister    Sleep apnea Sister    Ovarian cancer Sister 81   Osteoarthritis Maternal Grandmother     Medications:   Current Outpatient Medications on File Prior to Visit  Medication Sig Dispense Refill   acetaminophen  (TYLENOL ) 650 MG CR tablet Take 1 tablet (650 mg total) by mouth 3 (three) times daily as needed for pain.     allopurinol  (ZYLOPRIM ) 100  MG tablet Take 2 tablets (200 mg total) by mouth daily. 180 tablet 2   amLODipine  (NORVASC ) 5 MG tablet Take 0.5 tablets (2.5 mg total) by mouth daily.     ascorbic acid  (VITAMIN C) 500 MG tablet Take 1 tablet (500 mg total) by mouth daily.     aspirin  EC 81 MG tablet Take 1 tablet (81 mg total) by mouth every Monday, Wednesday, and Friday.     atorvastatin  (LIPITOR) 20 MG tablet Take 1 tablet (20 mg total) by mouth daily. 90 tablet 4   augmented betamethasone dipropionate (DIPROLENE-AF) 0.05 % cream Apply 1 Application topically 2 (two) times daily.     b complex vitamins capsule Take 1 capsule by mouth daily.     Cholecalciferol (VITAMIN D3) 1000 units CAPS Take 1,000 Units by mouth daily.     cyanocobalamin  1000 MCG tablet Take 1 tablet (1,000 mcg total) by mouth every Monday, Wednesday, and Friday.     metoprolol  succinate (TOPROL -XL) 25 MG 24 hr tablet Take 0.5 tablets (12.5 mg total) by mouth daily. 45 tablet 4   predniSONE  (DELTASONE ) 20 MG tablet Take two tablets daily for 5 days followed by one tablet daily for 5 days 15 tablet 0   valsartan  (DIOVAN ) 160 MG tablet Take 1 tablet (160 mg total) by mouth daily. 90 tablet 2   No current facility-administered medications on file prior to visit.    Allergies:   Allergies  Allergen Reactions   Naproxen Itching      OBJECTIVE:  Physical Exam  General: well developed, well nourished, very pleasant elderly Caucasian female, seated, in no evident distress  Neurologic Exam Mental Status: Awake and fully alert. Oriented to place and time. Recent and remote memory intact. Attention span, concentration and fund of knowledge appropriate. Mood and affect appropriate.          ASSESSMENT/PLAN: Rmani S Dia is a  83 y.o. year old female with concern of underlying sleep apnea with ongoing complaints of initiating and maintaining sleep, excessive daytime somnolence, snoring and daily dull headaches.    OSA on CPAP: Nocturnal  hypoxemia: Compliance report shows excellent usage and optimal residual AHI Continue current pressure settings of 5-9 with EPR 3 Complete ONO while on CPAP to ensure resolution of nocturnal hypoxemia Discussed importance of continued nightly usage with ensuring greater than 4 hours per night for optimal benefit DME [facility], continue to follow with DME for any new supplies or CPAP related concerns CPAP set up 08/2023  Excessive daytime sleepiness: Advised giving CPAP more time to take effect, hopefully as she continues to have improved tolerance, nighttime awakenings will become less frequent which can in turn help daytime energy levels Will also check ONO while on CPAP to ensure resolution of nocturnal hypoxemia If symptoms persist over the next several months, advised to follow-up with PCP for further evaluation although appears she had extensive lab work completed just about 1 year ago without any significant abnormalities    Follow-up in 6 months via MyChart video visit or call earlier if needed     CC:  PCP: Rilla Baller, MD    I personally spent a total of 25 minutes in the care of the patient today including preparing to see the patient, counseling and educating, placing orders, and documenting clinical information in the EHR.   Harlene Bogaert, AGNP-BC  [facility] [address] [place], [place] [address]  Phone [phone] Fax [phone] Note: This document was prepared with digital dictation and possible smart phrase technology. Any transcriptional errors that result from this process are unintentional.

## 2023-11-24 ENCOUNTER — Telehealth (INDEPENDENT_AMBULATORY_CARE_PROVIDER_SITE_OTHER): Admitting: Adult Health

## 2023-11-24 ENCOUNTER — Encounter: Payer: Self-pay | Admitting: Adult Health

## 2023-11-24 DIAGNOSIS — G4719 Other hypersomnia: Secondary | ICD-10-CM | POA: Diagnosis not present

## 2023-11-24 DIAGNOSIS — G4734 Idiopathic sleep related nonobstructive alveolar hypoventilation: Secondary | ICD-10-CM

## 2023-11-24 DIAGNOSIS — G4733 Obstructive sleep apnea (adult) (pediatric): Secondary | ICD-10-CM

## 2023-12-07 DIAGNOSIS — H11002 Unspecified pterygium of left eye: Secondary | ICD-10-CM | POA: Diagnosis not present

## 2023-12-07 DIAGNOSIS — S0502XA Injury of conjunctiva and corneal abrasion without foreign body, left eye, initial encounter: Secondary | ICD-10-CM | POA: Diagnosis not present

## 2023-12-12 ENCOUNTER — Telehealth: Payer: Self-pay | Admitting: Adult Health

## 2023-12-12 NOTE — Telephone Encounter (Signed)
 Pt is asking if her insurance company has approved her for the device for finger to check oxygen, please call.

## 2023-12-12 NOTE — Telephone Encounter (Signed)
 Cld Pt to discuss her question about pulse oximetry. Discussed her DME provider will send the equipment needed for the ONO sleep study and they should be reaching out to her regarding when they will send the equipment. Confirmed Pt has number for [facility] and advised her to call. Pt voiced understanding and thanks for the call back. Order was also resent to [facility].

## 2023-12-13 DIAGNOSIS — G4733 Obstructive sleep apnea (adult) (pediatric): Secondary | ICD-10-CM | POA: Diagnosis not present

## 2023-12-14 ENCOUNTER — Telehealth: Payer: Self-pay | Admitting: Neurology

## 2023-12-14 NOTE — Telephone Encounter (Signed)
 Pt completed ONO on CPAP 12/13/2023.  The recording was for 7 hours and 41 seconds.  The lowest her oxygen level dropped was 89% and there was only a recording of 5 seconds where it was under 89%.  CPAP is treating her apnea and hypoxia well.  Harlene reviewed for the pt and states that oxygen is not needed with this patient based off these results.

## 2023-12-21 DIAGNOSIS — N1832 Chronic kidney disease, stage 3b: Secondary | ICD-10-CM | POA: Diagnosis not present

## 2023-12-30 ENCOUNTER — Encounter: Payer: Self-pay | Admitting: Adult Health

## 2023-12-30 DIAGNOSIS — N1832 Chronic kidney disease, stage 3b: Secondary | ICD-10-CM | POA: Diagnosis not present

## 2023-12-30 DIAGNOSIS — H8110 Benign paroxysmal vertigo, unspecified ear: Secondary | ICD-10-CM | POA: Diagnosis not present

## 2023-12-30 DIAGNOSIS — N2581 Secondary hyperparathyroidism of renal origin: Secondary | ICD-10-CM | POA: Diagnosis not present

## 2023-12-30 DIAGNOSIS — I129 Hypertensive chronic kidney disease with stage 1 through stage 4 chronic kidney disease, or unspecified chronic kidney disease: Secondary | ICD-10-CM | POA: Diagnosis not present

## 2023-12-30 DIAGNOSIS — M8580 Other specified disorders of bone density and structure, unspecified site: Secondary | ICD-10-CM | POA: Diagnosis not present

## 2023-12-30 DIAGNOSIS — R739 Hyperglycemia, unspecified: Secondary | ICD-10-CM | POA: Diagnosis not present

## 2024-01-15 ENCOUNTER — Other Ambulatory Visit: Payer: Self-pay | Admitting: Family Medicine

## 2024-01-15 DIAGNOSIS — M8580 Other specified disorders of bone density and structure, unspecified site: Secondary | ICD-10-CM

## 2024-01-15 DIAGNOSIS — E782 Mixed hyperlipidemia: Secondary | ICD-10-CM

## 2024-01-15 DIAGNOSIS — N1832 Chronic kidney disease, stage 3b: Secondary | ICD-10-CM

## 2024-01-15 DIAGNOSIS — D72820 Lymphocytosis (symptomatic): Secondary | ICD-10-CM

## 2024-01-15 DIAGNOSIS — M109 Gout, unspecified: Secondary | ICD-10-CM

## 2024-01-15 DIAGNOSIS — R739 Hyperglycemia, unspecified: Secondary | ICD-10-CM

## 2024-01-15 DIAGNOSIS — E538 Deficiency of other specified B group vitamins: Secondary | ICD-10-CM

## 2024-01-16 ENCOUNTER — Other Ambulatory Visit (INDEPENDENT_AMBULATORY_CARE_PROVIDER_SITE_OTHER)

## 2024-01-16 DIAGNOSIS — M8580 Other specified disorders of bone density and structure, unspecified site: Secondary | ICD-10-CM

## 2024-01-16 DIAGNOSIS — R739 Hyperglycemia, unspecified: Secondary | ICD-10-CM

## 2024-01-16 DIAGNOSIS — D72820 Lymphocytosis (symptomatic): Secondary | ICD-10-CM | POA: Diagnosis not present

## 2024-01-16 DIAGNOSIS — E782 Mixed hyperlipidemia: Secondary | ICD-10-CM

## 2024-01-16 DIAGNOSIS — E538 Deficiency of other specified B group vitamins: Secondary | ICD-10-CM | POA: Diagnosis not present

## 2024-01-16 DIAGNOSIS — M109 Gout, unspecified: Secondary | ICD-10-CM

## 2024-01-16 DIAGNOSIS — N1832 Chronic kidney disease, stage 3b: Secondary | ICD-10-CM | POA: Diagnosis not present

## 2024-01-16 LAB — MICROALBUMIN / CREATININE URINE RATIO
Creatinine,U: 43.9 mg/dL
Microalb Creat Ratio: UNDETERMINED mg/g (ref 0.0–30.0)
Microalb, Ur: <0.7 mg/dL

## 2024-01-16 LAB — CBC WITH DIFFERENTIAL/PLATELET
Basophils Absolute: 0.1 K/uL (ref 0.0–0.1)
Basophils Relative: 0.9 % (ref 0.0–3.0)
Eosinophils Absolute: 0.3 K/uL (ref 0.0–0.7)
Eosinophils Relative: 3.1 % (ref 0.0–5.0)
HCT: 34.7 % — ABNORMAL LOW (ref 36.0–46.0)
Hemoglobin: 11.4 g/dL — ABNORMAL LOW (ref 12.0–15.0)
Lymphocytes Relative: 40 % (ref 12.0–46.0)
Lymphs Abs: 3.3 K/uL (ref 0.7–4.0)
MCHC: 32.7 g/dL (ref 30.0–36.0)
MCV: 94.4 fl (ref 78.0–100.0)
Monocytes Absolute: 0.6 K/uL (ref 0.1–1.0)
Monocytes Relative: 7.9 % (ref 3.0–12.0)
Neutro Abs: 4 K/uL (ref 1.4–7.7)
Neutrophils Relative %: 48.1 % (ref 43.0–77.0)
Platelets: 303 K/uL (ref 150.0–400.0)
RBC: 3.68 Mil/uL — ABNORMAL LOW (ref 3.87–5.11)
RDW: 14.4 % (ref 11.5–15.5)
WBC: 8.2 K/uL (ref 4.0–10.5)

## 2024-01-16 LAB — LIPID PANEL
Cholesterol: 117 mg/dL (ref 0–200)
HDL: 36.3 mg/dL — ABNORMAL LOW (ref >39.00)
LDL Cholesterol: 50 mg/dL (ref 0–99)
NonHDL: 80.47
Total CHOL/HDL Ratio: 3
Triglycerides: 150 mg/dL — ABNORMAL HIGH (ref 0.0–149.0)
VLDL: 30 mg/dL (ref 0.0–40.0)

## 2024-01-16 LAB — COMPREHENSIVE METABOLIC PANEL WITH GFR
ALT: 13 U/L (ref 0–35)
AST: 16 U/L (ref 0–37)
Albumin: 4 g/dL (ref 3.5–5.2)
Alkaline Phosphatase: 98 U/L (ref 39–117)
BUN: 18 mg/dL (ref 6–23)
CO2: 26 meq/L (ref 19–32)
Calcium: 9.7 mg/dL (ref 8.4–10.5)
Chloride: 107 meq/L (ref 96–112)
Creatinine, Ser: 1.2 mg/dL (ref 0.40–1.20)
GFR: 41.85 mL/min — ABNORMAL LOW (ref >60.00)
Glucose, Bld: 96 mg/dL (ref 70–99)
Potassium: 4.5 meq/L (ref 3.5–5.1)
Sodium: 142 meq/L (ref 135–145)
Total Bilirubin: 0.5 mg/dL (ref 0.2–1.2)
Total Protein: 6.5 g/dL (ref 6.0–8.3)

## 2024-01-16 LAB — HEMOGLOBIN A1C: Hgb A1c MFr Bld: 6.7 % — ABNORMAL HIGH (ref 4.6–6.5)

## 2024-01-16 LAB — PHOSPHORUS: Phosphorus: 4 mg/dL (ref 2.3–4.6)

## 2024-01-16 LAB — VITAMIN B12: Vitamin B-12: 690 pg/mL (ref 211–911)

## 2024-01-16 LAB — VITAMIN D 25 HYDROXY (VIT D DEFICIENCY, FRACTURES): VITD: 34.5 ng/mL (ref 30.00–100.00)

## 2024-01-16 LAB — URIC ACID: Uric Acid, Serum: 5.8 mg/dL (ref 2.4–7.0)

## 2024-01-17 ENCOUNTER — Ambulatory Visit: Payer: Self-pay | Admitting: Family Medicine

## 2024-01-17 LAB — PARATHYROID HORMONE, INTACT (NO CA): PTH: 24 pg/mL (ref 16–77)

## 2024-01-21 ENCOUNTER — Telehealth: Admitting: Family Medicine

## 2024-01-21 DIAGNOSIS — M1A0790 Idiopathic chronic gout, unspecified ankle and foot, without tophus (tophi): Secondary | ICD-10-CM | POA: Diagnosis not present

## 2024-01-21 MED ORDER — PREDNISONE 20 MG PO TABS: ORAL_TABLET | ORAL | 0 refills | Status: DC

## 2024-01-21 NOTE — Progress Notes (Signed)

## 2024-01-23 ENCOUNTER — Encounter: Payer: Self-pay | Admitting: Family Medicine

## 2024-01-23 ENCOUNTER — Ambulatory Visit: Admitting: Family Medicine

## 2024-01-23 VITALS — BP 138/66 | HR 74 | Temp 98.4°F | Ht 61.0 in | Wt 159.1 lb

## 2024-01-23 DIAGNOSIS — M8580 Other specified disorders of bone density and structure, unspecified site: Secondary | ICD-10-CM | POA: Diagnosis not present

## 2024-01-23 DIAGNOSIS — H9193 Unspecified hearing loss, bilateral: Secondary | ICD-10-CM

## 2024-01-23 DIAGNOSIS — M109 Gout, unspecified: Secondary | ICD-10-CM

## 2024-01-23 DIAGNOSIS — I872 Venous insufficiency (chronic) (peripheral): Secondary | ICD-10-CM

## 2024-01-23 DIAGNOSIS — I6523 Occlusion and stenosis of bilateral carotid arteries: Secondary | ICD-10-CM

## 2024-01-23 DIAGNOSIS — N1832 Chronic kidney disease, stage 3b: Secondary | ICD-10-CM

## 2024-01-23 DIAGNOSIS — M7989 Other specified soft tissue disorders: Secondary | ICD-10-CM

## 2024-01-23 DIAGNOSIS — I10 Essential (primary) hypertension: Secondary | ICD-10-CM | POA: Diagnosis not present

## 2024-01-23 DIAGNOSIS — E782 Mixed hyperlipidemia: Secondary | ICD-10-CM

## 2024-01-23 DIAGNOSIS — Z0000 Encounter for general adult medical examination without abnormal findings: Secondary | ICD-10-CM

## 2024-01-23 DIAGNOSIS — E785 Hyperlipidemia, unspecified: Secondary | ICD-10-CM | POA: Diagnosis not present

## 2024-01-23 DIAGNOSIS — Z23 Encounter for immunization: Secondary | ICD-10-CM

## 2024-01-23 DIAGNOSIS — E1169 Type 2 diabetes mellitus with other specified complication: Secondary | ICD-10-CM

## 2024-01-23 DIAGNOSIS — E538 Deficiency of other specified B group vitamins: Secondary | ICD-10-CM | POA: Diagnosis not present

## 2024-01-23 DIAGNOSIS — G4733 Obstructive sleep apnea (adult) (pediatric): Secondary | ICD-10-CM

## 2024-01-23 DIAGNOSIS — E119 Type 2 diabetes mellitus without complications: Secondary | ICD-10-CM | POA: Insufficient documentation

## 2024-01-23 DIAGNOSIS — Z7189 Other specified counseling: Secondary | ICD-10-CM

## 2024-01-23 MED ORDER — ATORVASTATIN CALCIUM 20 MG PO TABS
20.0000 mg | ORAL_TABLET | Freq: Every day | ORAL | 3 refills | Status: AC
Start: 2024-01-23 — End: ?

## 2024-01-23 MED ORDER — BLOOD GLUCOSE TEST VI STRP: 1.0000 | ORAL_STRIP | Freq: Three times a day (TID) | 0 refills | Status: DC

## 2024-01-23 MED ORDER — METOPROLOL SUCCINATE ER 25 MG PO TB24: 12.5000 mg | ORAL_TABLET | Freq: Every day | ORAL | 3 refills | Status: AC

## 2024-01-23 MED ORDER — LANCET DEVICE MISC: 1.0000 | Freq: Three times a day (TID) | 0 refills | Status: AC

## 2024-01-23 MED ORDER — LANCETS MISC. MISC
1.0000 | Freq: Three times a day (TID) | 0 refills | Status: DC
Start: 2024-01-23 — End: 2024-02-22

## 2024-01-23 MED ORDER — BLOOD GLUCOSE MONITORING SUPPL DEVI
1.0000 | Freq: Three times a day (TID) | 0 refills | Status: AC
Start: 2024-01-23 — End: ?

## 2024-01-23 MED ORDER — ALLOPURINOL 100 MG PO TABS: 200.0000 mg | ORAL_TABLET | Freq: Every day | ORAL | 3 refills | Status: AC

## 2024-01-23 MED ORDER — VALSARTAN 160 MG PO TABS: 160.0000 mg | ORAL_TABLET | Freq: Every day | ORAL | 3 refills | Status: AC

## 2024-01-23 NOTE — Progress Notes (Unsigned)
 gutl Ph: [phone] Fax: [phone]   Patient ID: Carrie Lucas, female    DOB: 1941/01/29, 83 y.o.   MRN: [id number]  This visit was conducted in person.  BP 138/66   Pulse 74   Temp 98.4 F (36.9 C) (Oral)   Ht 5' 1 (1.549 m)   Wt 159 lb 2 oz (72.2 kg)   SpO2 95%   BMI 30.07 kg/m    CC: AMW Subjective:   HPI: Carrie Lucas is a 83 y.o. female presenting on 01/23/2024 for Annual Exam   Did not see health advisor this year.   Hearing Screening - Comments:: Wears B hearing aids. Wearing at today's OV.  Vision Screening - Comments:: Last eye exam, 11/2023.  Flowsheet Row Office Visit from 01/23/2024 in [facility] [place]  PHQ-2 Total Score 1       01/23/2024   10:18 AM 09/28/2023   11:31 AM 09/08/2023    9:48 AM 08/29/2023    2:05 PM 03/11/2023    4:04 PM  Fall Risk   Falls in the past year? 0 0 0 0 0  Number falls in past yr: 0   0   Injury with Fall?    0   Risk for fall due to : No Fall Risks   No Fall Risks   Follow up    Falls evaluation completed    Recurrent podagra, last seen for E visit over the weekend with exacerbation treated with prednisone  40mg  7d burst with benefit. Flared despite allopurinol  200mg  daily (started 09/2023). No dietary changes to trigger flare.   OSA on CPAP followed by [facility] NP Jess McCue.   CKD stage 3 due to hypertensive nephropathy followed by [facility] nephrology Dr Jerrye. Seeing yearly. On valsartan  and Toprol  XL. Hydrochlorothiazide  stopped due to orthostasis. Lasix  stopped to limit volume depletion and previously worsened renal function. Higher amlodipine  caused ankle swelling. Most recently amlodipine  dose stopped (?contribution to chronic LLE dermatitis) - this has significantly improved.   Erosive osteoarthritis B hands has seen rheumatology (Dr Jeannetta) - tylenol  650mg  and topical voltaren not helpful. CBD salve helpful. ANA positive - rheum eval negative (anti-dsDNA, anti-Smith, anti-SSA).   Ongoing L leg  dermatitis saw derm attributed to vascular issues. Compression stockings and cream didn't help.   Low back pain with neuropathy. Completed 16 sessions of PT. MRI showed facet arthropathy and increased spinal stenosis L3/4. Saw PM&R Dr Bonner s/p ESI. Had 2 level decompression and fusion 08/2022 Bonney). S/p eternal bone growth stimulator to reduce chance of pseudoarthrosis requiring further surgery.  Preventative:   COLONOSCOPY Date: 11/2010 TAx1, diverticulosis, rpt 5 yrs (Outlaw). COLONOSCOPY 03/2016 - WNL (Outlaw). Aged out. No blood in stool, bowel changes. Well woman with GYN Dr. Stark, last saw 2017. S/p hysterectomy and oophorectomy.  [facility] +fmhx.  DEXA - 12/2015 DEXA T -1.6 hip, -1.3 forearm - mild osteopenia.  DEXA 06/2021 - T -1.7 LFN with increased fracture risk (4.8% hip, 20.2% major fx). Prolia injection ok by nephrology - but insurance would not cover due to no OP dx.  Lung cancer screening - not eligible  Flu yearly  COVID vaccine - Pfizer 04/2019, 06/2019, booster 01/2020, bivalent 12/2020 Pneumovax 08/2005. Prevnar-13 11/2013, prevnar-20 today Tdap 04/2013  zostavax - 08/2013 Shingrix - 11/2023 - will get 2nd dose at pharmacy Advanced directives: living will scanned 11/2016. Brings HCPOA with husband Ubaldo then daughter Rosaline listed (06/2020).  Seat belt use  discussed.  Sunscreen use discussed. No changing moles on skin. H/o melanoma, sees derm Dr Lynnell yearly.  Never smoker. Alcohol - none.  Dentist yearly - due (no dental insurance)  Eye exam yearly in December Bowels - no constipation  Bladder - notes urinary urge incontinence despite sling procedure - incomplete emptying, no stress incontinence. Occ accidents.   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 Interim medical history since our last visit reviewed. Allergies and medications reviewed and updated. Outpatient Medications Prior to Visit  Medication Sig Dispense Refill   acetaminophen  (TYLENOL ) 650 MG CR tablet Take 1 tablet (650 mg total) by mouth 3 (three) times daily as needed for pain.     ascorbic acid  (VITAMIN C) 500 MG tablet Take 1 tablet (500 mg total) by mouth daily.     aspirin  EC 81 MG tablet Take 1 tablet (81 mg total) by mouth every Monday, Wednesday, and Friday.     b complex vitamins capsule Take 1 capsule by mouth daily.     Cholecalciferol (VITAMIN D3) 1000 units CAPS Take 1,000 Units by mouth daily.     cyanocobalamin  1000 MCG tablet Take 1 tablet (1,000 mcg total) by mouth every Monday, Wednesday, and Friday.     predniSONE  (DELTASONE ) 20 MG tablet Take two tablets daily for 7 days 14 tablet 0   allopurinol  (ZYLOPRIM ) 100 MG tablet Take 2 tablets (200 mg total) by mouth daily. 180 tablet 2   atorvastatin  (LIPITOR) 20 MG tablet Take 1 tablet (20 mg total) by mouth daily. 90 tablet 4   metoprolol  succinate (TOPROL -XL) 25 MG 24 hr tablet Take 0.5 tablets (12.5 mg total) by mouth daily. 45 tablet 4   valsartan  (DIOVAN ) 160 MG tablet Take 1 tablet (160 mg total) by mouth daily. 90 tablet 2   amLODipine  (NORVASC ) 5 MG tablet Take 0.5 tablets (2.5 mg total) by mouth daily.     augmented betamethasone dipropionate (DIPROLENE-AF) 0.05 % cream Apply 1 Application topically 2 (two) times daily.     No facility-administered medications prior to visit.     Per HPI unless specifically indicated in ROS section below Review of Systems  Objective:  BP 138/66   Pulse 74   Temp 98.4 F (36.9 C) (Oral)   Ht 5' 1 (1.549 m)   Wt 159 lb 2 oz (72.2 kg)   SpO2 95%   BMI 30.07 kg/m   Wt Readings from Last 3 Encounters:  01/23/24 159 lb 2 oz (72.2 kg)  09/28/23 154 lb  (69.9 kg)  09/16/23 156 lb (70.8 kg)      Physical Exam Vitals and nursing note reviewed.  Constitutional:      Appearance: Normal appearance. She is not ill-appearing.  HENT:     Head: Normocephalic and atraumatic.     Right Ear: Tympanic membrane, ear canal and external ear normal. There is no impacted cerumen.     Left Ear: Tympanic membrane, ear canal and external ear normal. There is no impacted cerumen.     Mouth/Throat:     Mouth: Mucous membranes are moist.     Pharynx: Oropharynx is clear. No oropharyngeal exudate or posterior oropharyngeal erythema.  Eyes:  General:        Right eye: No discharge.        Left eye: No discharge.     Extraocular Movements: Extraocular movements intact.     Conjunctiva/sclera: Conjunctivae normal.     Pupils: Pupils are equal, round, and reactive to light.  Neck:     Thyroid : No thyroid  mass or thyromegaly.  Cardiovascular:     Rate and Rhythm: Normal rate and regular rhythm.     Pulses: Normal pulses.     Heart sounds: Normal heart sounds. No murmur heard. Pulmonary:     Effort: Pulmonary effort is normal. No respiratory distress.     Breath sounds: Normal breath sounds. No wheezing, rhonchi or rales.  Abdominal:     General: Bowel sounds are normal. There is no distension.     Palpations: Abdomen is soft. There is no mass.     Tenderness: There is no abdominal tenderness. There is no guarding or rebound.     Hernia: No hernia is present.  Musculoskeletal:     Cervical back: Normal range of motion and neck supple. No rigidity.     Right lower leg: No edema.     Left lower leg: No edema.  Lymphadenopathy:     Cervical: No cervical adenopathy.  Skin:    General: Skin is warm and dry.     Findings: No rash.  Neurological:     General: No focal deficit present.     Mental Status: She is alert. Mental status is at baseline.     Comments:  Recall 3/3 Calculation 5/5 DLROW  Psychiatric:        Mood and Affect: Mood normal.         Behavior: Behavior normal.       Results for orders placed or performed in visit on 01/16/24  Hemoglobin A1c   Collection Time: 01/16/24  8:11 AM  Result Value Ref Range   Hgb A1c MFr Bld 6.7 (H) 4.6 - 6.5 %  Uric acid   Collection Time: 01/16/24  8:11 AM  Result Value Ref Range   Uric Acid, Serum 5.8 2.4 - 7.0 mg/dL  Vitamin A87   Collection Time: 01/16/24  8:11 AM  Result Value Ref Range   Vitamin B-12 690 211 - 911 pg/mL  CBC with Differential/Platelet   Collection Time: 01/16/24  8:11 AM  Result Value Ref Range   WBC 8.2 4.0 - 10.5 K/uL   RBC 3.68 (L) 3.87 - 5.11 Mil/uL   Hemoglobin 11.4 (L) 12.0 - 15.0 g/dL   HCT 65.2 (L) 63.9 - 53.9 %   MCV 94.4 78.0 - 100.0 fl   MCHC 32.7 30.0 - 36.0 g/dL   RDW 85.5 88.4 - 84.4 %   Platelets 303.0 150.0 - 400.0 K/uL   Neutrophils Relative % 48.1 43.0 - 77.0 %   Lymphocytes Relative 40.0 12.0 - 46.0 %   Monocytes Relative 7.9 3.0 - 12.0 %   Eosinophils Relative 3.1 0.0 - 5.0 %   Basophils Relative 0.9 0.0 - 3.0 %   Neutro Abs 4.0 1.4 - 7.7 K/uL   Lymphs Abs 3.3 0.7 - 4.0 K/uL   Monocytes Absolute 0.6 0.1 - 1.0 K/uL   Eosinophils Absolute 0.3 0.0 - 0.7 K/uL   Basophils Absolute 0.1 0.0 - 0.1 K/uL  Parathyroid  hormone, intact (no Ca)   Collection Time: 01/16/24  8:11 AM  Result Value Ref Range   PTH 24 16 - 77 pg/mL  Microalbumin /  creatinine urine ratio   Collection Time: 01/16/24  8:11 AM  Result Value Ref Range   Microalb, Ur <0.7 mg/dL   Creatinine,U 56.0 mg/dL   Microalb Creat Ratio Unable to calculate 0.0 - 30.0 mg/g  VITAMIN D  25 Hydroxy (Vit-D Deficiency, Fractures)   Collection Time: 01/16/24  8:11 AM  Result Value Ref Range   VITD 34.50 30.00 - 100.00 ng/mL  Phosphorus   Collection Time: 01/16/24  8:11 AM  Result Value Ref Range   Phosphorus 4.0 2.3 - 4.6 mg/dL  Comprehensive metabolic panel with GFR   Collection Time: 01/16/24  8:11 AM  Result Value Ref Range   Sodium 142 135 - 145 mEq/L   Potassium 4.5  3.5 - 5.1 mEq/L   Chloride 107 96 - 112 mEq/L   CO2 26 19 - 32 mEq/L   Glucose, Bld 96 70 - 99 mg/dL   BUN 18 6 - 23 mg/dL   Creatinine, Ser 8.79 0.40 - 1.20 mg/dL   Total Bilirubin 0.5 0.2 - 1.2 mg/dL   Alkaline Phosphatase 98 39 - 117 U/L   AST 16 0 - 37 U/L   ALT 13 0 - 35 U/L   Total Protein 6.5 6.0 - 8.3 g/dL   Albumin 4.0 3.5 - 5.2 g/dL   GFR 58.14 (L) >39.99 mL/min   Calcium  9.7 8.4 - 10.5 mg/dL  Lipid panel   Collection Time: 01/16/24  8:11 AM  Result Value Ref Range   Cholesterol 117 0 - 200 mg/dL   Triglycerides 849.9 (H) 0.0 - 149.0 mg/dL   HDL 63.69 (L) >60.99 mg/dL   VLDL 69.9 0.0 - 59.9 mg/dL   LDL Cholesterol 50 0 - 99 mg/dL   Total CHOL/HDL Ratio 3    NonHDL 80.47     Assessment & Plan:   Problem List Items Addressed This Visit     Osteopenia with high risk of fracture   Insurance did not cover Prolia injections.  Consider updated DEXA in 2026.       HTN (hypertension)   Relevant Medications   atorvastatin  (LIPITOR) 20 MG tablet   metoprolol  succinate (TOPROL -XL) 25 MG 24 hr tablet   valsartan  (DIOVAN ) 160 MG tablet   Hyperlipidemia associated with type 2 diabetes mellitus (HCC)   Chronic, on atorvastatin  20mg  - continue. Reviewed new LDL goal <100 in diabetic, already at goal.  The ASCVD Risk score (Arnett DK, et al., 2019) failed to calculate for the following reasons:   The 2019 ASCVD risk score is only valid for ages 17 to 51       Relevant Medications   atorvastatin  (LIPITOR) 20 MG tablet   metoprolol  succinate (TOPROL -XL) 25 MG 24 hr tablet   valsartan  (DIOVAN ) 160 MG tablet   Medicare annual wellness visit, subsequent - Primary (Chronic)   I have personally reviewed the Medicare Annual Wellness questionnaire and have noted 1. The patient's medical and social history 2. Their use of alcohol, tobacco or illicit drugs 3. Their current medications and supplements 4. The patient's functional ability including ADL's, fall risks, home safety  risks and hearing or visual impairment. Cognitive function has been assessed and addressed as indicated.  5. Diet and physical activity 6. Evidence for depression or mood disorders The patients weight, height, BMI have been recorded in the chart. I have made referrals, counseling and provided education to the patient based on review of the above and I have provided the pt with a written personalized care plan for preventive services. Provider  list updated.. See scanned questionairre as needed for further documentation. Reviewed preventative protocols and updated unless pt declined.       CKD (chronic kidney disease) stage 3, GFR 30-59 ml/min (HCC)   Appreciate nephrology care.       Advanced care planning/counseling discussion (Chronic)   Advanced directives: living will scanned 11/2016. Brings HCPOA with husband Ubaldo then daughter Rosaline listed (06/2020).       Carotid stenosis, asymptomatic   No bruit appreciated today.       Relevant Medications   atorvastatin  (LIPITOR) 20 MG tablet   metoprolol  succinate (TOPROL -XL) 25 MG 24 hr tablet   valsartan  (DIOVAN ) 160 MG tablet   Bilateral hearing loss   Wears hearing aides      Low serum vitamin B12   Levels normal on oral B12 replacement MWF      Anemia in chronic kidney disease (CKD)   Chronic, mild. Continue to monitor.       Podagra   Recent recurrent podagra treated by E visit with prednisone  burst.  This is despite allopurinol  200mg  daily. Caution with increased titration in CKD - consider Uloric.  Latest urate 5.8.       Relevant Medications   allopurinol  (ZYLOPRIM ) 100 MG tablet   Left leg swelling   Has seen derm Chronic recurrent LLE dermatitis/swelling actually improving off amlodipine .       Stasis dermatitis   Improving off amlodipine .      OSA on CPAP   Appreciate neurology care.       Type 2 diabetes mellitus with other specified complication (HCC)   New diagnosis of diet controlled diabetes  reviewed with patient.  Recommend tight BP, cholesterol control.  ERx glucometer with strips. Refer to diabetes education classes.  RTC 4 mo DM f/u visit       Relevant Medications   atorvastatin  (LIPITOR) 20 MG tablet   valsartan  (DIOVAN ) 160 MG tablet   Other Relevant Orders   Ambulatory referral to diabetic education   Other Visit Diagnoses       Need for influenza vaccination       Relevant Orders   Flu vaccine HIGH DOSE PF(Fluzone Trivalent) (Completed)     Need for vaccination against Streptococcus pneumoniae       Relevant Orders   Pneumococcal conjugate vaccine 20-valent (Completed)        Meds ordered this encounter  Medications   allopurinol  (ZYLOPRIM ) 100 MG tablet    Sig: Take 2 tablets (200 mg total) by mouth daily.    Dispense:  180 tablet    Refill:  3   atorvastatin  (LIPITOR) 20 MG tablet    Sig: Take 1 tablet (20 mg total) by mouth daily.    Dispense:  90 tablet    Refill:  3   metoprolol  succinate (TOPROL -XL) 25 MG 24 hr tablet    Sig: Take 0.5 tablets (12.5 mg total) by mouth daily.    Dispense:  45 tablet    Refill:  3   valsartan  (DIOVAN ) 160 MG tablet    Sig: Take 1 tablet (160 mg total) by mouth daily.    Dispense:  90 tablet    Refill:  3   Blood Glucose Monitoring Suppl DEVI    Sig: 1 each by Does not apply route in the morning, at noon, and at bedtime. May substitute to any manufacturer covered by patient's insurance.    Dispense:  1 each    Refill:  0   Glucose Blood (  BLOOD GLUCOSE TEST STRIPS) STRP    Sig: 1 each by Does not apply route in the morning, at noon, and at bedtime. May substitute to any manufacturer covered by patient's insurance.    Dispense:  100 strip    Refill:  0   Lancet Device MISC    Sig: 1 each by Does not apply route in the morning, at noon, and at bedtime. May substitute to any manufacturer covered by patient's insurance.    Dispense:  1 each    Refill:  0   Lancets Misc. MISC    Sig: 1 each by Does not apply  route in the morning, at noon, and at bedtime. May substitute to any manufacturer covered by patient's insurance.    Dispense:  100 each    Refill:  0    Orders Placed This Encounter  Procedures   Flu vaccine HIGH DOSE PF(Fluzone Trivalent)   Pneumococcal conjugate vaccine 20-valent   Ambulatory referral to diabetic education    Referral Priority:   Routine    Referral Type:   Consultation    Referral Reason:   Specialty Services Required    Number of Visits Requested:   1    Patient Instructions  Flu shot today  Prevnar-20 today  Sugars were in diet controlled diabetes range. Work on low sugar low carb diabetic diet.  We will refer you to diabetes education classes in [place].  I will also send in glucometer to have available.  Goal fasting sugar is 80-120, goal 2 hours after a meal is <180. Let me know if any lows <70.  Good to see you today  Return in 4 months for diabetes follow up visit   Follow up plan: Return in about 6 months (around 07/22/2024) for follow up visit.  Anton Blas, MD

## 2024-01-23 NOTE — Patient Instructions (Addendum)
 Flu shot today  Prevnar-20 today  Sugars were in diet controlled diabetes range. Work on low sugar low carb diabetic diet.  We will refer you to diabetes education classes in [place].  I will also send in glucometer to have available.  Goal fasting sugar is 80-120, goal 2 hours after a meal is <180. Let me know if any lows <70.  Good to see you today  Return in 4 months for diabetes follow up visit

## 2024-01-24 NOTE — Assessment & Plan Note (Signed)
Improving off amlodipine.

## 2024-01-24 NOTE — Assessment & Plan Note (Signed)
Appreciate neurology care.  

## 2024-01-24 NOTE — Assessment & Plan Note (Signed)
 Levels normal on oral B12 replacement MWF

## 2024-01-24 NOTE — Assessment & Plan Note (Signed)
 Chronic, on atorvastatin  20mg  - continue. Reviewed new LDL goal <100 in diabetic, already at goal.  The ASCVD Risk score (Arnett DK, et al., 2019) failed to calculate for the following reasons:   The 2019 ASCVD risk score is only valid for ages 47 to 74

## 2024-01-24 NOTE — Assessment & Plan Note (Addendum)
 New diagnosis of diet controlled diabetes reviewed with patient.  Recommend tight BP, cholesterol control.  ERx glucometer with strips. Refer to diabetes education classes.  RTC 4 mo DM f/u visit

## 2024-01-24 NOTE — Assessment & Plan Note (Signed)
Advanced directives: living will scanned 11/2016. Brings HCPOA with husband Peyton Najjar then daughter Marcelino Duster listed (06/2020).

## 2024-01-24 NOTE — Assessment & Plan Note (Signed)
 Wears hearing aides.

## 2024-01-24 NOTE — Assessment & Plan Note (Signed)
 Insurance did not cover Prolia injections.  Consider updated DEXA in 2026.

## 2024-01-24 NOTE — Assessment & Plan Note (Addendum)
 Has seen derm Chronic recurrent LLE dermatitis/swelling actually improving off amlodipine .

## 2024-01-24 NOTE — Assessment & Plan Note (Signed)
 Recent recurrent podagra treated by E visit with prednisone  burst.  This is despite allopurinol  200mg  daily. Caution with increased titration in CKD - consider Uloric.  Latest urate 5.8.

## 2024-01-24 NOTE — Assessment & Plan Note (Signed)
Chronic, mild. Continue to monitor.  

## 2024-01-24 NOTE — Assessment & Plan Note (Signed)
Appreciate nephrology care.

## 2024-01-24 NOTE — Assessment & Plan Note (Signed)
No bruit appreciated today.  

## 2024-01-24 NOTE — Assessment & Plan Note (Signed)

## 2024-01-27 ENCOUNTER — Ambulatory Visit: Payer: Self-pay | Admitting: Family Medicine

## 2024-01-27 NOTE — Telephone Encounter (Signed)
 Ok to use prednisone  she has at home - but would suggest she take this way: take 1/2 tablet of the prednisone  20mg  tablets she has at home for 10mg  daily for 2-3 days through the weekend, then stop.  To let us  know if any new gout flare symptoms develop after this.

## 2024-01-27 NOTE — Telephone Encounter (Signed)
 FYI Only or Action Required?: Action required by provider: clinical question for provider.  Patient was last seen in primary care on 01/23/2024 by Rilla Baller, MD.  Called Nurse Triage reporting Foot Pain.  Symptoms are: gradually improving.  Triage Disposition: Call PCP When Office is Open  Patient/caregiver understands and will follow disposition?: Yes             Copied from CRM #[id number]. Topic: Clinical - Red Word Triage >> Jan 27, 2024 12:08 PM Roselie BROCKS wrote: Red Word that prompted transfer to Nurse Triage: Patient has gout in her foot and leg, and is having pain and swelling Reason for Disposition  [1] Caller requesting NON-URGENT health information AND [2] PCP's office is the best resource  Answer Assessment - Initial Assessment Questions Pt states she saw her PCP on Mon after a gout flare up that started last weekend. Pt has gout on her right foot and leg. Pt finished her 7 day prednisone  today. Pt states she has seen an improvement in her symptoms but the top of her foot is still swollen. Pt states the pain has gone away. Pt states she is worried about this flaring back up over the weekend and not being able to get in touch with someone. Pt question is if this starts flaring up over the weekend, can she take the prednisone  she has at home from a previous prescription that she never took.   The previous prescription (prescribed 03/25/2023): prednisone  20 mg take two tablets for two days, then one tablet for 5 days  [phone]  Answer Assessment - Initial Assessment Questions Pt states she saw her PCP on Mon after a gout flare up that started last weekend. Pt has gout in her right foot and leg. Pt finished her 7 day prednisone  today. Pt states she has seen an improvement in her symptoms but the top of her foot is still swollen. Pt states the pain has gone away. Pt states she is worried about this flaring back up over the weekend and not being able to get in touch  with someone. Pt question is if this starts flaring up over the weekend, can she take the prednisone  she has at home from a previous prescription that she never took.   The previous prescription (prescribed 03/25/2023): prednisone  20 mg take two tablets for two days, then one tablet for 5 days  Pt call back number [phone]  Protocols used: Foot Pain-A-AH, Information Only Call - No Triage-A-AH

## 2024-01-27 NOTE — Telephone Encounter (Addendum)
 Spoke with pt relaying Dr Talmadge message. Pt wrote down instructions and verbalizes understanding.

## 2024-02-10 ENCOUNTER — Other Ambulatory Visit: Payer: Self-pay

## 2024-02-10 MED ORDER — BLOOD GLUCOSE MONITORING SUPPL DEVI: 1.0000 | Freq: Three times a day (TID) | 0 refills | Status: AC

## 2024-02-10 MED ORDER — BLOOD GLUCOSE TEST VI STRP: 1.0000 | ORAL_STRIP | Freq: Three times a day (TID) | 0 refills | Status: DC

## 2024-02-13 ENCOUNTER — Encounter: Payer: Self-pay | Admitting: Family Medicine

## 2024-02-22 ENCOUNTER — Other Ambulatory Visit: Payer: Self-pay

## 2024-02-22 DIAGNOSIS — E1169 Type 2 diabetes mellitus with other specified complication: Secondary | ICD-10-CM

## 2024-02-22 MED ORDER — LANCETS MISC. MISC: 1.0000 | Freq: Every day | 0 refills | Status: AC

## 2024-02-22 MED ORDER — BLOOD GLUCOSE TEST VI STRP: 1.0000 | ORAL_STRIP | Freq: Every day | 0 refills | Status: AC

## 2024-02-27 ENCOUNTER — Encounter: Payer: Self-pay | Admitting: Radiology

## 2024-03-14 ENCOUNTER — Ambulatory Visit: Payer: Self-pay | Admitting: *Deleted

## 2024-03-14 NOTE — Telephone Encounter (Signed)
 Patient requesting if PCP can see her today she only lives 10 minutes away.      FYI Only or Action Required?: FYI only for provider: appointment scheduled on 03/15/24.  Patient was last seen in primary care on 01/23/2024 by Rilla Baller, MD.  Called Nurse Triage reporting Cough.  Symptoms began several days ago.  5 days ago  Interventions attempted: OTC medications: tylenol , delsym cough medication .  Symptoms are: gradually worsening.  Triage Disposition: See Physician Within 24 Hours  Patient/caregiver understands and will follow disposition?: Yes             Copied from CRM #[id number]. Topic: Clinical - Red Word Triage >> Mar 14, 2024  2:13 PM Carrie Lucas wrote: Red Word that prompted transfer to Nurse Triage: patient called stating for the past five days she has had diarrhea, a runny nose, coughing up yellow phlegm and a headache Reason for Disposition  [1] Fever returns after gone for over 24 hours AND [2] symptoms worse or not improved  Answer Assessment - Initial Assessment Questions Patient requesting to only see PCP. No available appt until next week. Patient agreed to see other provider tomorrow 03/15/24. Patient has not tested at home for covid or flu.  Sx not improving after 5 days.        1. ONSET: When did the cough begin?      5 days ago  2. SEVERITY: How bad is the cough today?      Productive cough not severe 3. SPUTUM: Describe the color of your sputum (e.g., none, dry cough; clear, white, yellow, green)     Yellow  4. HEMOPTYSIS: Are you coughing up any blood? If Yes, ask: How much? (e.g., flecks, streaks, tablespoons, etc.)     Na  5. DIFFICULTY BREATHING: Are you having difficulty breathing? If Yes, ask: How bad is it? (e.g., mild, moderate, severe)      No issues with breathing  6. FEVER: Do you have a fever? If Yes, ask: What is your temperature, how was it measured, and when did it start?     Low grade  7. CARDIAC  HISTORY: Do you have any history of heart disease? (e.g., heart attack, congestive heart failure)      Na  8. LUNG HISTORY: Do you have any history of lung disease?  (e.g., pulmonary embolus, asthma, emphysema)     Na  9. PE RISK FACTORS: Do you have a history of blood clots? (or: recent major surgery, recent prolonged travel, bedridden)     Na  10. OTHER SYMPTOMS: Do you have any other symptoms? (e.g., runny nose, wheezing, chest pain)       Sx started with sneezing, diarrhea and no diarrhea x today. Runny nose, headache, cough productive yellow. Low grade fever 11. PREGNANCY: Is there any chance you are pregnant? When was your last menstrual period?       na 12. TRAVEL: Have you traveled out of the country in the last month? (e.g., travel history, exposures)       na  Protocols used: Cough - Acute Productive-A-AH

## 2024-03-14 NOTE — Telephone Encounter (Signed)
 I'm just getting this message now. Appreciate Tabitha seeing this pleasant patient tomorrow.

## 2024-03-15 ENCOUNTER — Ambulatory Visit: Admitting: Family

## 2024-03-15 VITALS — BP 136/88 | HR 65 | Temp 99.4°F | Wt 154.4 lb

## 2024-03-15 DIAGNOSIS — Z20822 Contact with and (suspected) exposure to covid-19: Secondary | ICD-10-CM

## 2024-03-15 DIAGNOSIS — J22 Unspecified acute lower respiratory infection: Secondary | ICD-10-CM

## 2024-03-15 DIAGNOSIS — R509 Fever, unspecified: Secondary | ICD-10-CM

## 2024-03-15 DIAGNOSIS — R051 Acute cough: Secondary | ICD-10-CM | POA: Diagnosis not present

## 2024-03-15 DIAGNOSIS — B9689 Other specified bacterial agents as the cause of diseases classified elsewhere: Secondary | ICD-10-CM

## 2024-03-15 LAB — POC COVID19 BINAXNOW: SARS Coronavirus 2 Ag: NEGATIVE

## 2024-03-15 LAB — POCT INFLUENZA A/B
Influenza A, POC: NEGATIVE
Influenza B, POC: NEGATIVE

## 2024-03-15 MED ORDER — AMOXICILLIN-POT CLAVULANATE 875-125 MG PO TABS: 1.0000 | ORAL_TABLET | Freq: Two times a day (BID) | ORAL | 0 refills | Status: DC

## 2024-03-15 MED ORDER — BENZONATATE 200 MG PO CAPS: 200.0000 mg | ORAL_CAPSULE | Freq: Two times a day (BID) | ORAL | 0 refills | Status: DC | PRN

## 2024-03-15 NOTE — Progress Notes (Signed)
 Established Patient Office Visit  Subjective:      CC:  Chief Complaint  Patient presents with   Acute Visit    Cough, congestion, headache, low grade fever.     HPI: Carrie Lucas is a 83 y.o. female presenting on 03/15/2024 for Acute Visit (Cough, congestion, headache, low grade fever. ) .  Discussed the use of AI scribe software for clinical note transcription with the patient, who gave verbal consent to proceed.  History of Present Illness Carrie Lucas is an 83 year old female who presents with respiratory symptoms and fatigue persisting for six days.  Symptoms began six days ago with significant sneezing, followed by diarrhea lasting a couple of days. The most bothersome symptom is a persistent cough, accompanied by a low-grade fever peaking at 99.2F. No sore throat or ear pain, but she is coughing up yellow sputum.  She experiences increased shortness of breath compared to her baseline and significant fatigue, describing herself as 'very tired' and 'weak'. She frequently blows her nose and feels generally 'lousy' with some cramping sensations.  No known medication allergies and infrequent use of antibiotics, except for urinary tract infections. No allergies to penicillin or related antibiotics.         Social history:  Relevant past medical, surgical, family and social history reviewed and updated as indicated. Interim medical history since our last visit reviewed.  Allergies and medications reviewed and updated.  DATA REVIEWED: CHART IN EPIC     ROS: Negative unless specifically indicated above in HPI.    Current Outpatient Medications:    acetaminophen  (TYLENOL ) 650 MG CR tablet, Take 1 tablet (650 mg total) by mouth 3 (three) times daily as needed for pain., Disp: , Rfl:    allopurinol  (ZYLOPRIM ) 100 MG tablet, Take 2 tablets (200 mg total) by mouth daily., Disp: 180 tablet, Rfl: 3   amoxicillin-clavulanate (AUGMENTIN) 875-125 MG tablet, Take 1  tablet by mouth 2 (two) times daily., Disp: 20 tablet, Rfl: 0   ascorbic acid  (VITAMIN C) 500 MG tablet, Take 1 tablet (500 mg total) by mouth daily., Disp: , Rfl:    aspirin  EC 81 MG tablet, Take 1 tablet (81 mg total) by mouth every Monday, Wednesday, and Friday., Disp: , Rfl:    atorvastatin  (LIPITOR) 20 MG tablet, Take 1 tablet (20 mg total) by mouth daily., Disp: 90 tablet, Rfl: 3   b complex vitamins capsule, Take 1 capsule by mouth daily., Disp: , Rfl:    benzonatate  (TESSALON ) 200 MG capsule, Take 1 capsule (200 mg total) by mouth 2 (two) times daily as needed for cough., Disp: 20 capsule, Rfl: 0   Blood Glucose Monitoring Suppl DEVI, 1 each by Does not apply route in the morning, at noon, and at bedtime. May substitute to any manufacturer covered by patient's insurance. E11.69, Disp: 1 each, Rfl: 0   Cholecalciferol (VITAMIN D3) 1000 units CAPS, Take 1,000 Units by mouth daily., Disp: , Rfl:    cyanocobalamin  1000 MCG tablet, Take 1 tablet (1,000 mcg total) by mouth every Monday, Wednesday, and Friday., Disp: , Rfl:    Glucose Blood (BLOOD GLUCOSE TEST STRIPS) STRP, 1 each by Does not apply route daily. May substitute to any manufacturer covered by patient's insurance. E11.69, Disp: 100 strip, Rfl: 0   Lancets Misc. MISC, 1 each by Does not apply route daily. May substitute to any manufacturer covered by patient's insurance. Dx E11.6, Disp: 100 each, Rfl: 0   metoprolol  succinate (TOPROL -XL) 25 MG  24 hr tablet, Take 0.5 tablets (12.5 mg total) by mouth daily., Disp: 45 tablet, Rfl: 3   valsartan  (DIOVAN ) 160 MG tablet, Take 1 tablet (160 mg total) by mouth daily., Disp: 90 tablet, Rfl: 3        Objective:        BP 136/88 (BP Location: Left Arm, Patient Position: Sitting, Cuff Size: Large)   Pulse 65   Temp 99.4 F (37.4 C) (Temporal)   Wt 154 lb 6.4 oz (70 kg)   SpO2 98%   BMI 29.17 kg/m   Physical Exam HEENT: Throat non-tender. Cervical lymph nodes non-tender. Oropharynx  not erythematous. CHEST: Lungs clear to auscultation bilaterally.  Wt Readings from Last 3 Encounters:  03/15/24 154 lb 6.4 oz (70 kg)  01/23/24 159 lb 2 oz (72.2 kg)  09/28/23 154 lb (69.9 kg)    Physical Exam Constitutional:      General: She is not in acute distress.    Appearance: Normal appearance. She is normal weight. She is not ill-appearing, toxic-appearing or diaphoretic.  HENT:     Head: Normocephalic.     Right Ear: Tympanic membrane normal.     Left Ear: Tympanic membrane normal.     Nose: Nose normal.     Mouth/Throat:     Mouth: Mucous membranes are dry.     Pharynx: No oropharyngeal exudate or posterior oropharyngeal erythema.  Eyes:     Extraocular Movements: Extraocular movements intact.     Pupils: Pupils are equal, round, and reactive to light.  Cardiovascular:     Rate and Rhythm: Normal rate and regular rhythm.     Pulses: Normal pulses.     Heart sounds: Normal heart sounds.  Pulmonary:     Effort: Pulmonary effort is normal.     Breath sounds: Normal breath sounds.  Musculoskeletal:     Cervical back: Normal range of motion.  Neurological:     General: No focal deficit present.     Mental Status: She is alert and oriented to person, place, and time. Mental status is at baseline.  Psychiatric:        Mood and Affect: Mood normal.        Behavior: Behavior normal.        Thought Content: Thought content normal.        Judgment: Judgment normal.          Results LABS COVID-19 test: Negative Influenza test: Negative  Assessment & Plan:   Assessment and Plan Assessment & Plan Acute lower respiratory infection with cough and fever Symptoms have persisted for approximately one week, including sneezing, diarrhea, cough with yellow sputum, low-grade fever (99.39F), and increased shortness of breath. No sore throat or ear pain. COVID and flu tests are negative. Examination reveals no wheezing or fluid sounds in the lungs, but some lymphadenopathy  and nasal congestion. Throat is slightly inflamed. Given the duration and lack of improvement, bacterial infection is suspected, possibly settling in the lungs, warranting antibiotic treatment to prevent pneumonia. - Prescribed Augmentin  for suspected bacterial infection. - Sent prescription to [facility] on [address]. - Prescribed Tessalon  Perles for cough management.        Return if symptoms worsen or fail to improve.     Ginger Patrick, MSN, APRN, FNP-C [facility]

## 2024-03-17 ENCOUNTER — Encounter: Payer: Self-pay | Admitting: Family

## 2024-03-17 DIAGNOSIS — B9689 Other specified bacterial agents as the cause of diseases classified elsewhere: Secondary | ICD-10-CM

## 2024-03-19 MED ORDER — DOXYCYCLINE HYCLATE 100 MG PO TABS: 100.0000 mg | ORAL_TABLET | Freq: Two times a day (BID) | ORAL | 0 refills | Status: AC

## 2024-04-15 ENCOUNTER — Other Ambulatory Visit: Payer: Self-pay | Admitting: Family Medicine

## 2024-05-15 ENCOUNTER — Encounter: Payer: Self-pay | Admitting: Family Medicine

## 2024-05-15 ENCOUNTER — Ambulatory Visit: Admitting: Family Medicine

## 2024-05-15 VITALS — BP 138/60 | HR 66 | Temp 98.2°F | Ht 61.0 in | Wt 158.2 lb

## 2024-05-15 DIAGNOSIS — E1122 Type 2 diabetes mellitus with diabetic chronic kidney disease: Secondary | ICD-10-CM

## 2024-05-15 DIAGNOSIS — I872 Venous insufficiency (chronic) (peripheral): Secondary | ICD-10-CM

## 2024-05-15 DIAGNOSIS — H8112 Benign paroxysmal vertigo, left ear: Secondary | ICD-10-CM | POA: Diagnosis not present

## 2024-05-15 DIAGNOSIS — M1A0790 Idiopathic chronic gout, unspecified ankle and foot, without tophus (tophi): Secondary | ICD-10-CM | POA: Diagnosis not present

## 2024-05-15 DIAGNOSIS — N1832 Chronic kidney disease, stage 3b: Secondary | ICD-10-CM | POA: Diagnosis not present

## 2024-05-15 DIAGNOSIS — E1169 Type 2 diabetes mellitus with other specified complication: Secondary | ICD-10-CM | POA: Diagnosis not present

## 2024-05-15 DIAGNOSIS — I10 Essential (primary) hypertension: Secondary | ICD-10-CM | POA: Diagnosis not present

## 2024-05-15 LAB — POCT GLYCOSYLATED HEMOGLOBIN (HGB A1C): Hemoglobin A1C: 6.2 % — AB (ref 4.0–5.6)

## 2024-05-15 NOTE — Assessment & Plan Note (Signed)
 Chronic issue, fleeting episode in office again noted.  Has seen ENT, has completed vestibular rehab without improvement.  Has learned to manage this at home with slow movements, avoiding sudden heard turns at home.

## 2024-05-15 NOTE — Assessment & Plan Note (Addendum)
 Chronic, followed by dermatology, isolated to LLE - chronic, ongoing, notes regular knee high compression stocking on left leg helps control symptoms but quick recurrence of dermatitis if she misses 1 day of compression stocking use.  Topical steroids didn't help Stopping amlodipine  hasn't really helped.

## 2024-05-15 NOTE — Assessment & Plan Note (Signed)
 HTN and DM related.  Appreciate nephrology care.

## 2024-05-15 NOTE — Progress Notes (Signed)
 " Ph: [phone] Fax: [phone]   Patient ID: Carrie Lucas, female    DOB: February 16, 1941, 84 y.o.   MRN: [id number]  This visit was conducted in person.  BP 138/60 (BP Location: Left Arm, Patient Position: Sitting, Cuff Size: Large)   Pulse 66   Temp 98.2 F (36.8 C) (Oral)   Ht 5' 1 (1.549 m)   Wt 158 lb 3.2 oz (71.8 kg)   SpO2 97%   BMI 29.89 kg/m   BP Readings from Last 3 Encounters:  05/15/24 138/60  03/15/24 136/88  01/23/24 138/66   Orthostatic Vitals for the past 48 hrs (Last 6 readings):  Patient Position Orthostatic BP BP Pulse BP Location Cuff Size  05/15/24 1428 Sitting -- 138/60 66 Left Arm Large  05/15/24 1522 Supine 140/60 -- -- Left Arm Large  05/15/24 1523 Standing 140/70 -- -- Left Arm Large  05/15/24 1524 Standing 138/68 -- -- Left Arm Large   CC: 4 mo f/u visit  Subjective:   HPI: Carrie Lucas is a 84 y.o. female presenting on 05/15/2024 for Medical Management of Chronic Issues (Lightheaded when standing, some thirst but pt would not define as excessive/Low sodium, no soft drinks, no processed meats, limited sugars, Only water, )   Latest gout flare 12/2023 (recurrent podagra). She continues allopurinol  200mg  daily. Lab Results  Component Value Date   LABURIC 5.8 01/16/2024    CKD stage 3 due to hypertensive retinopathy followed by [facility] nephrology Dr Jerrye. Continues Toprol  XL 12.5mg  daily, valsartan  160mg  daily. Hydrochlorothiazide  stopped due to orthostasis, lasix  stopped due to worsening renal function. Amlodipine  caused leg edema. Renal suggested more lenient BP control.   HTN - BP stable on current regimen however she notes ongoing orthostatic lightheadedness. Will check orthostatic vital signs today.   Chronic LLE dermatitis - saw derm, topical steroid cream didn't help, attributed to CVI due to poor circulation. She wears compression stocking.   Diabetes - latest A1c actually improved from 6.7% off medication.  Lab Results  Component  Value Date   HGBA1C 6.2 (A) 05/15/2024   Chronic L sided BPPV for 10 yrs - with recurrent episode today on exam.      Relevant past medical, surgical, family and social history reviewed and updated as indicated. Interim medical history since our last visit reviewed. Allergies and medications reviewed and updated. Outpatient Medications Prior to Visit  Medication Sig Dispense Refill   acetaminophen  (TYLENOL ) 650 MG CR tablet Take 1 tablet (650 mg total) by mouth 3 (three) times daily as needed for pain.     allopurinol  (ZYLOPRIM ) 100 MG tablet Take 2 tablets (200 mg total) by mouth daily. 180 tablet 3   ascorbic acid  (VITAMIN C) 500 MG tablet Take 1 tablet (500 mg total) by mouth daily.     aspirin  EC 81 MG tablet Take 1 tablet (81 mg total) by mouth every Monday, Wednesday, and Friday.     atorvastatin  (LIPITOR) 20 MG tablet Take 1 tablet (20 mg total) by mouth daily. 90 tablet 3   b complex vitamins capsule Take 1 capsule by mouth daily.     Blood Glucose Monitoring Suppl DEVI 1 each by Does not apply route in the morning, at noon, and at bedtime. May substitute to any manufacturer covered by patient's insurance. E11.69 1 each 0   Cholecalciferol (VITAMIN D3) 1000 units CAPS Take 1,000 Units by mouth daily.     cyanocobalamin  1000 MCG tablet Take 1 tablet (1,000 mcg total) by mouth  every Monday, Wednesday, and Friday.     Glucose Blood (BLOOD GLUCOSE TEST STRIPS) STRP 1 each by Does not apply route daily. May substitute to any manufacturer covered by patient's insurance. E11.69 100 strip 0   Lancets Misc. MISC 1 each by Does not apply route daily. May substitute to any manufacturer covered by patient's insurance. Dx E11.6 100 each 0   metoprolol  succinate (TOPROL -XL) 25 MG 24 hr tablet Take 0.5 tablets (12.5 mg total) by mouth daily. 45 tablet 3   valsartan  (DIOVAN ) 160 MG tablet Take 1 tablet (160 mg total) by mouth daily. 90 tablet 3   amoxicillin -clavulanate (AUGMENTIN ) 875-125 MG tablet  Take 1 tablet by mouth 2 (two) times daily. (Patient not taking: Reported on 05/15/2024) 20 tablet 0   benzonatate  (TESSALON ) 200 MG capsule Take 1 capsule (200 mg total) by mouth 2 (two) times daily as needed for cough. (Patient not taking: Reported on 05/15/2024) 20 capsule 0   No facility-administered medications prior to visit.     Per HPI unless specifically indicated in ROS section below Review of Systems  Objective:  BP 138/60 (BP Location: Left Arm, Patient Position: Sitting, Cuff Size: Large)   Pulse 66   Temp 98.2 F (36.8 C) (Oral)   Ht 5' 1 (1.549 m)   Wt 158 lb 3.2 oz (71.8 kg)   SpO2 97%   BMI 29.89 kg/m   Wt Readings from Last 3 Encounters:  05/15/24 158 lb 3.2 oz (71.8 kg)  03/15/24 154 lb 6.4 oz (70 kg)  01/23/24 159 lb 2 oz (72.2 kg)      Physical Exam Vitals and nursing note reviewed.  Constitutional:      Appearance: Normal appearance. She is not ill-appearing.  HENT:     Head: Normocephalic and atraumatic.     Mouth/Throat:     Mouth: Mucous membranes are moist.     Pharynx: Oropharynx is clear. No posterior oropharyngeal erythema.  Eyes:     General:        Right eye: No discharge.        Left eye: No discharge.     Extraocular Movements: Extraocular movements intact.     Conjunctiva/sclera: Conjunctivae normal.     Pupils: Pupils are equal, round, and reactive to light.  Cardiovascular:     Rate and Rhythm: Normal rate and regular rhythm.     Pulses: Normal pulses.     Heart sounds: Normal heart sounds. No murmur heard. Pulmonary:     Effort: Pulmonary effort is normal. No respiratory distress.     Breath sounds: Normal breath sounds. No wheezing, rhonchi or rales.  Musculoskeletal:     Cervical back: Normal range of motion and neck supple.     Right lower leg: No edema.     Left lower leg: No edema.  Skin:    General: Skin is warm and dry.     Findings: No rash.  Neurological:     Mental Status: She is alert.     Comments:  Quick  episode of reproducible vertigo with turning head left while supine  Psychiatric:        Mood and Affect: Mood normal.        Behavior: Behavior normal.       Results for orders placed or performed in visit on 05/15/24  HgB A1c   Collection Time: 05/15/24  2:37 PM  Result Value Ref Range   Hemoglobin A1C 6.2 (A) 4.0 - 5.6 %   HbA1c  POC (<> result, manual entry)     HbA1c, POC (prediabetic range)     HbA1c, POC (controlled diabetic range)      Assessment & Plan:   Problem List Items Addressed This Visit     HTN (hypertension)   Chronic, well controlled on current regimen.  Amlodipine  latest stopped mid last year.  Update orthostatics given endorsing orthostatic dizziness.       Stage 3b chronic kidney disease due to type 2 diabetes mellitus (HCC)   HTN and DM related.  Appreciate nephrology care.       BPPV (benign paroxysmal positional vertigo), left   Chronic issue, fleeting episode in office again noted.  Has seen ENT, has completed vestibular rehab without improvement.  Has learned to manage this at home with slow movements, avoiding sudden heard turns at home.       Chronic venous insufficiency of lower extremity   Gout   Stable period on allopurinol  200mg  daily without recent gout  flare      Stasis dermatitis   Chronic, followed by dermatology, isolated to LLE - chronic, ongoing, notes regular knee high compression stocking on left leg helps control symptoms but quick recurrence of dermatitis if she misses 1 day of compression stocking use.  Topical steroids didn't help Stopping amlodipine  hasn't really helped.       Type 2 diabetes mellitus with other specified complication (HCC) - Primary   Improved control based on A1c to prediabetes range - diet controlled.  She has not yet seen nutritionist - she will follow up for this.  RTC 4 mo DM f/u visit       Relevant Orders   HgB A1c (Completed)     No orders of the defined types were placed in this  encounter.   Orders Placed This Encounter  Procedures   HgB A1c    Patient Instructions  You are doing well today  Continue current regimen.  Orthostatic vital signs checked today  Reschedule May appointment to 4 months from now  Follow up plan: Return in about 4 months (around 09/12/2024) for follow up visit.  Anton Blas, MD   "

## 2024-05-15 NOTE — Patient Instructions (Addendum)
 You are doing well today  Continue current regimen.  Orthostatic vital signs checked today  Reschedule May appointment to 4 months from now

## 2024-05-15 NOTE — Assessment & Plan Note (Signed)
Stable period on allopurinol 200mg  daily without recent gout flare.

## 2024-05-15 NOTE — Assessment & Plan Note (Signed)
 Improved control based on A1c to prediabetes range - diet controlled.  She has not yet seen nutritionist - she will follow up for this.  RTC 4 mo DM f/u visit

## 2024-05-15 NOTE — Assessment & Plan Note (Signed)
 Chronic, well controlled on current regimen.  Amlodipine  latest stopped mid last year.  Update orthostatics given endorsing orthostatic dizziness.

## 2024-05-31 ENCOUNTER — Telehealth: Admitting: Adult Health

## 2024-06-05 ENCOUNTER — Ambulatory Visit: Admitting: Adult Health

## 2024-06-26 ENCOUNTER — Ambulatory Visit: Admitting: Adult Health

## 2024-07-23 ENCOUNTER — Ambulatory Visit: Admitting: Family Medicine

## 2024-09-05 ENCOUNTER — Other Ambulatory Visit

## 2024-09-12 ENCOUNTER — Ambulatory Visit: Admitting: Family Medicine
